# Patient Record
Sex: Male | Born: 1945
Health system: Southern US, Community
[De-identification: ages and names within clinical notes are randomized; demographics above are authoritative.]

## PROBLEM LIST (undated history)

## (undated) DIAGNOSIS — R0600 Dyspnea, unspecified: Secondary | ICD-10-CM

## (undated) DIAGNOSIS — T8859XA Other complications of anesthesia, initial encounter: Secondary | ICD-10-CM

## (undated) DIAGNOSIS — Z9981 Dependence on supplemental oxygen: Secondary | ICD-10-CM

## (undated) DIAGNOSIS — I499 Cardiac arrhythmia, unspecified: Secondary | ICD-10-CM

## (undated) DIAGNOSIS — M545 Low back pain, unspecified: Secondary | ICD-10-CM

## (undated) DIAGNOSIS — E079 Disorder of thyroid, unspecified: Secondary | ICD-10-CM

## (undated) DIAGNOSIS — M1990 Unspecified osteoarthritis, unspecified site: Secondary | ICD-10-CM

## (undated) DIAGNOSIS — I10 Essential (primary) hypertension: Secondary | ICD-10-CM

## (undated) DIAGNOSIS — Z9889 Other specified postprocedural states: Secondary | ICD-10-CM

## (undated) DIAGNOSIS — J40 Bronchitis, not specified as acute or chronic: Secondary | ICD-10-CM

## (undated) DIAGNOSIS — Z8719 Personal history of other diseases of the digestive system: Secondary | ICD-10-CM

## (undated) DIAGNOSIS — I639 Cerebral infarction, unspecified: Secondary | ICD-10-CM

## (undated) DIAGNOSIS — K219 Gastro-esophageal reflux disease without esophagitis: Secondary | ICD-10-CM

## (undated) DIAGNOSIS — J449 Chronic obstructive pulmonary disease, unspecified: Secondary | ICD-10-CM

## (undated) DIAGNOSIS — J8489 Other specified interstitial pulmonary diseases: Secondary | ICD-10-CM

## (undated) DIAGNOSIS — Z87442 Personal history of urinary calculi: Secondary | ICD-10-CM

## (undated) DIAGNOSIS — E039 Hypothyroidism, unspecified: Secondary | ICD-10-CM

## (undated) DIAGNOSIS — J189 Pneumonia, unspecified organism: Secondary | ICD-10-CM

## (undated) DIAGNOSIS — N200 Calculus of kidney: Secondary | ICD-10-CM

## (undated) DIAGNOSIS — F17200 Nicotine dependence, unspecified, uncomplicated: Secondary | ICD-10-CM

## (undated) DIAGNOSIS — D689 Coagulation defect, unspecified: Secondary | ICD-10-CM

## (undated) DIAGNOSIS — K635 Polyp of colon: Secondary | ICD-10-CM

## (undated) HISTORY — PX: FACIAL FRACTURE SURGERY: SHX1570

## (undated) HISTORY — DX: Unspecified osteoarthritis, unspecified site: M19.90

## (undated) HISTORY — PX: ESOPHAGOGASTRODUODENOSCOPY ENDOSCOPY: SHX5814

## (undated) HISTORY — PX: ULNAR NERVE TRANSPOSITION: SHX2595

## (undated) HISTORY — DX: Essential (primary) hypertension: I10

## (undated) HISTORY — DX: Coagulation defect, unspecified: D68.9

## (undated) HISTORY — DX: Other specified interstitial pulmonary diseases: J84.89

## (undated) HISTORY — DX: Gastro-esophageal reflux disease without esophagitis: K21.9

## (undated) HISTORY — DX: Nicotine dependence, unspecified, uncomplicated: F17.200

## (undated) HISTORY — PX: DUPUYTREN CONTRACTURE RELEASE: SHX1478

## (undated) HISTORY — DX: Low back pain: M54.5

## (undated) HISTORY — DX: Low back pain, unspecified: M54.50

## (undated) HISTORY — DX: Calculus of kidney: N20.0

## (undated) HISTORY — DX: Chronic obstructive pulmonary disease, unspecified: J44.9

## (undated) HISTORY — PX: COLONOSCOPY: SHX174

## (undated) HISTORY — DX: Disorder of thyroid, unspecified: E07.9

## (undated) HISTORY — PX: CATARACT EXTRACTION: SUR2

## (undated) HISTORY — DX: Bronchitis, not specified as acute or chronic: J40

## (undated) HISTORY — DX: Polyp of colon: K63.5

## (undated) HISTORY — PX: OTHER SURGICAL HISTORY: SHX169

---

## 1998-09-23 ENCOUNTER — Encounter: Admission: RE | Admit: 1998-09-23 | Discharge: 1998-11-02 | Payer: Self-pay | Admitting: Neurosurgery

## 1998-09-30 ENCOUNTER — Ambulatory Visit (HOSPITAL_BASED_OUTPATIENT_CLINIC_OR_DEPARTMENT_OTHER): Admission: RE | Admit: 1998-09-30 | Discharge: 1998-09-30 | Payer: Self-pay | Admitting: Otolaryngology

## 1998-11-21 ENCOUNTER — Ambulatory Visit (HOSPITAL_COMMUNITY): Admission: RE | Admit: 1998-11-21 | Discharge: 1998-11-21 | Payer: Self-pay | Admitting: Neurosurgery

## 1998-11-21 ENCOUNTER — Encounter: Payer: Self-pay | Admitting: Neurosurgery

## 1998-12-05 ENCOUNTER — Ambulatory Visit (HOSPITAL_COMMUNITY): Admission: RE | Admit: 1998-12-05 | Discharge: 1998-12-05 | Payer: Self-pay | Admitting: Neurosurgery

## 1998-12-05 ENCOUNTER — Encounter: Payer: Self-pay | Admitting: Neurosurgery

## 1998-12-19 ENCOUNTER — Encounter: Payer: Self-pay | Admitting: Neurosurgery

## 1998-12-19 ENCOUNTER — Ambulatory Visit (HOSPITAL_COMMUNITY): Admission: RE | Admit: 1998-12-19 | Discharge: 1998-12-19 | Payer: Self-pay | Admitting: Neurosurgery

## 1999-03-22 ENCOUNTER — Ambulatory Visit (HOSPITAL_COMMUNITY): Admission: RE | Admit: 1999-03-22 | Discharge: 1999-03-22 | Payer: Self-pay | Admitting: Neurosurgery

## 1999-03-22 ENCOUNTER — Encounter: Payer: Self-pay | Admitting: Neurosurgery

## 1999-04-05 ENCOUNTER — Encounter: Payer: Self-pay | Admitting: Neurosurgery

## 1999-04-05 ENCOUNTER — Ambulatory Visit (HOSPITAL_COMMUNITY): Admission: RE | Admit: 1999-04-05 | Discharge: 1999-04-05 | Payer: Self-pay | Admitting: Neurosurgery

## 2000-07-03 ENCOUNTER — Ambulatory Visit (HOSPITAL_COMMUNITY): Admission: RE | Admit: 2000-07-03 | Discharge: 2000-07-03 | Payer: Self-pay | Admitting: *Deleted

## 2000-07-03 HISTORY — PX: CARDIAC CATHETERIZATION: SHX172

## 2001-04-07 ENCOUNTER — Ambulatory Visit: Admission: RE | Admit: 2001-04-07 | Discharge: 2001-04-07 | Payer: Self-pay | Admitting: Internal Medicine

## 2001-04-07 ENCOUNTER — Encounter: Payer: Self-pay | Admitting: Internal Medicine

## 2001-04-09 ENCOUNTER — Ambulatory Visit (HOSPITAL_COMMUNITY): Admission: RE | Admit: 2001-04-09 | Discharge: 2001-04-09 | Payer: Self-pay | Admitting: Internal Medicine

## 2001-04-09 ENCOUNTER — Encounter (INDEPENDENT_AMBULATORY_CARE_PROVIDER_SITE_OTHER): Payer: Self-pay | Admitting: *Deleted

## 2004-03-17 ENCOUNTER — Ambulatory Visit: Payer: Self-pay | Admitting: Internal Medicine

## 2004-04-20 ENCOUNTER — Ambulatory Visit: Payer: Self-pay | Admitting: Internal Medicine

## 2005-03-19 ENCOUNTER — Ambulatory Visit (HOSPITAL_COMMUNITY): Admission: RE | Admit: 2005-03-19 | Discharge: 2005-03-19 | Payer: Self-pay | Admitting: Family Medicine

## 2005-04-05 ENCOUNTER — Encounter: Admission: RE | Admit: 2005-04-05 | Discharge: 2005-04-05 | Payer: Self-pay | Admitting: Orthopedic Surgery

## 2005-10-18 ENCOUNTER — Ambulatory Visit: Payer: Self-pay | Admitting: Internal Medicine

## 2005-10-22 ENCOUNTER — Ambulatory Visit: Payer: Self-pay | Admitting: Cardiology

## 2009-02-17 ENCOUNTER — Ambulatory Visit (HOSPITAL_COMMUNITY): Admission: RE | Admit: 2009-02-17 | Discharge: 2009-02-17 | Payer: Self-pay | Admitting: Orthopedic Surgery

## 2009-04-21 ENCOUNTER — Encounter (INDEPENDENT_AMBULATORY_CARE_PROVIDER_SITE_OTHER): Payer: Self-pay | Admitting: *Deleted

## 2009-09-05 ENCOUNTER — Telehealth: Payer: Self-pay | Admitting: Internal Medicine

## 2010-05-30 NOTE — Letter (Signed)
Summary: Colonoscopy Letter  [facility]  [address] [place], [place] [address]   Phone: [phone]  Fax: [phone]      April 21, 2009 MRN: [id number]   ERICA OSUNA [address] [facility] [address] [place], [place]  [address]   Dear Mr. STAIRS,   According to your medical record, it is time for you to schedule a Colonoscopy. The American Cancer Society recommends this procedure as a method to detect early colon cancer. Patients with a family history of colon cancer, or a personal history of colon polyps or inflammatory bowel disease are at increased risk.  This letter has beeen generated based on the recommendations made at the time of your procedure. If you feel that in your particular situation this may no longer apply, please contact our office.  Please call our office at [phone] to schedule this appointment or to update your records at your earliest convenience.  Thank you for cooperating with [place] to provide you with the very best care possible.   Sincerely,  Wilhemina Bonito. Marina Goodell, M.D.  [facility] [phone]

## 2010-05-30 NOTE — Progress Notes (Signed)
Summary: Schedule Colonoscopy  Phone Note Outgoing Call Call back at [facility] Phone [phone]   Call placed by: Harlow Mares CMA Duncan Dull),  Sep 05, 2009 10:43 AM Call placed to: Patient Summary of Call: Left message on patients machine to call back. patient is due for his colonoscopy Initial call taken by: Harlow Mares CMA Duncan Dull),  Sep 05, 2009 10:44 AM  Follow-up for Phone Call        Left message on patients machine to call back. we will mail the pt a letter Follow-up by: Harlow Mares CMA Duncan Dull),  Sep 14, 2009 10:57 AM

## 2010-05-31 ENCOUNTER — Encounter: Payer: Self-pay | Admitting: Family Medicine

## 2010-08-28 ENCOUNTER — Ambulatory Visit (HOSPITAL_COMMUNITY)
Admission: RE | Admit: 2010-08-28 | Discharge: 2010-08-28 | Disposition: A | Discharge status: Home / Self Care | Payer: BC Managed Care – PPO | Source: Ambulatory Visit | Attending: Family Medicine | Admitting: Family Medicine

## 2010-08-28 ENCOUNTER — Other Ambulatory Visit: Payer: Self-pay | Admitting: Family Medicine

## 2010-08-28 DIAGNOSIS — J984 Other disorders of lung: Secondary | ICD-10-CM | POA: Insufficient documentation

## 2010-08-28 DIAGNOSIS — R918 Other nonspecific abnormal finding of lung field: Secondary | ICD-10-CM

## 2010-08-28 DIAGNOSIS — J438 Other emphysema: Secondary | ICD-10-CM | POA: Insufficient documentation

## 2010-08-28 MED ORDER — IOHEXOL 300 MG/ML  SOLN
80.0000 mL | Freq: Once | INTRAMUSCULAR | Status: AC | PRN
  Administered 2010-08-28: 80 mL via INTRAVENOUS

## 2010-09-15 NOTE — Cardiovascular Report (Signed)
[facility]  Patient:    Antonio Bowen, Antonio Bowen                     MRN: [id number] Proc. Date: 07/03/00 Adm. Date:  [id number] Attending:  Daisey Must CC:         Monica Becton, M.D.             Rollene Rotunda, M.D. [facility]             Cardiac Catheterization Lab                        Cardiac Catheterization  PROCEDURE:   Left heart catheterization with coronary angiography and left ventriculography.  CARDIOLOGIST:  Daisey Must, M.D. [facility]  INDICATION:  Mr. Kohlmann is a 65 year old male who has been having chest pain. A stress Cardiolite was interpreted as revealing mild anterior ischemia and possible mild inferior ischemia.  He was, therefore, referred for cardiac catheterization.  CATHETERIZATION PROCEDURAL NOTE:  A 6-French sheath was placed in the right femoral artery.  Standard Judkins 6-French catheters were utilized.  Contrast is Omnipaque.  There were no complications.  CATHETERIZATION RESULTS:  HEMODYNAMICS:  Left ventricular pressure 144/12, aortic pressure 144/84. There was no aortic valve gradient.  LEFT VENTRICULOGRAM:  Wall motion is normal.  Ejection fraction calculated at 61%.  There was trace mitral regurgitation.  CORONARY ANGIOGRAPHY: (Right dominant).  Left main is normal.  Left anterior descending artery gives risk to a small first diagonal and normal size second diagonal.  The LAD is free of angiographic disease.  Left circumflex coronary artery gives rise to a normal size OM-1, small OM-2, and normal size OM-3.  The left circumflex is free of angiographic disease. Right coronary artery is a dominant vessel giving rise to a normal size posterior descending artery and two small posterolateral branches.   The right coronary artery is free of angiographic disease.  IMPRESSIONS: 1. Normal left ventricular systolic function. 2. Angiographically normal coronary arteries. DD:  07/03/00 TD:  07/04/00 Job:  [id number]

## 2010-09-20 ENCOUNTER — Encounter: Payer: Self-pay | Admitting: Critical Care Medicine

## 2010-09-21 ENCOUNTER — Encounter: Payer: Self-pay | Admitting: Critical Care Medicine

## 2010-09-21 ENCOUNTER — Ambulatory Visit (INDEPENDENT_AMBULATORY_CARE_PROVIDER_SITE_OTHER): Payer: BC Managed Care – PPO | Admitting: Critical Care Medicine

## 2010-09-21 DIAGNOSIS — E039 Hypothyroidism, unspecified: Secondary | ICD-10-CM | POA: Insufficient documentation

## 2010-09-21 DIAGNOSIS — R918 Other nonspecific abnormal finding of lung field: Secondary | ICD-10-CM

## 2010-09-21 DIAGNOSIS — J441 Chronic obstructive pulmonary disease with (acute) exacerbation: Secondary | ICD-10-CM | POA: Insufficient documentation

## 2010-09-21 DIAGNOSIS — I10 Essential (primary) hypertension: Secondary | ICD-10-CM

## 2010-09-21 DIAGNOSIS — J449 Chronic obstructive pulmonary disease, unspecified: Secondary | ICD-10-CM

## 2010-09-21 DIAGNOSIS — K219 Gastro-esophageal reflux disease without esophagitis: Secondary | ICD-10-CM | POA: Insufficient documentation

## 2010-09-21 DIAGNOSIS — M545 Low back pain, unspecified: Secondary | ICD-10-CM | POA: Insufficient documentation

## 2010-09-21 DIAGNOSIS — H900 Conductive hearing loss, bilateral: Secondary | ICD-10-CM

## 2010-09-21 NOTE — Assessment & Plan Note (Signed)
Mild COPD FeV1 80% pred Fef 25 75 50% , ex long time smoker  Plan Cont symbicort for now The pt has successfully quit smoking 4/12

## 2010-09-21 NOTE — Patient Instructions (Addendum)
No change in medications A bronchoscopy will be performed at [facility] on 09/27/10  At 1130AM,  Arrive at 1030AM, nothing by mouth after midnight the night before Return [facility] office for followup June 1st, Friday

## 2010-09-21 NOTE — Progress Notes (Signed)
Subjective:     Patient ID: Antonio Bowen, male   DOB: 12-Aug-1945, 65 y.o.   MRN: [id number]  Shortness of Breath This is a recurrent problem. The current episode started more than 1 month ago. The problem occurs daily. The problem has been unchanged (Dyspneic at rest and exertion). Associated symptoms include headaches, orthopnea, PND, rhinorrhea, sputum production and wheezing. Pertinent negatives include no abdominal pain, chest pain, ear pain, fever, hemoptysis, leg swelling, neck pain, rash, sore throat or vomiting. The symptoms are aggravated by eating, exercise, any activity, fumes, odors and weather changes. Associated symptoms comments: Had night sweats Mucus was green brown. He has tried beta agonist inhalers and steroid inhalers (antibiotics have helped ) for the symptoms. The treatment provided moderate relief. His past medical history is significant for bronchiolitis, chronic lung disease, COPD and pneumonia. There is no history of asthma.  Cough This is a chronic problem. The current episode started more than 1 month ago. The problem has been gradually improving. The cough is productive of purulent sputum. Associated symptoms include chills, headaches, postnasal drip, rhinorrhea, shortness of breath, sweats, weight loss and wheezing. Pertinent negatives include no chest pain, ear pain, fever, hemoptysis, myalgias, rash or sore throat. Associated symptoms comments: Lost 8# but gaining it back. The symptoms are aggravated by cold air, exercise and fumes. He has tried steroid inhaler and a beta-agonist inhaler (antibiotics) for the symptoms. The treatment provided moderate relief. His past medical history is significant for bronchitis, COPD and pneumonia. There is no history of asthma.   [name] referred for abn CT/CXR 04/10/10: started symptoms.  Had physical exam. Had dyspnea.  Felt as though pattern to breath abn.  Then started with a panic. Xray abn R side.  CT scan not done though  ordered. Then end of 4/12:  Got worse with more congestion, head stopped up, sweats.  XRay worse, CT scan and progressed with bilat UPPER lobe infilt.  Treatment then avelox x 7days.  Plus Mucinex D.  Then rx two more doses one week ago.  This did help the symptoms but not more than 80%.  Also started symbicort two puff once a day then increased to twice daily and this helped sleep better.  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 Bronchitis   . Tobacco dependence   . COPD (chronic obstructive pulmonary disease)   . Hypertension   . GERD (gastroesophageal reflux disease)   . Allergic rhinitis   . Low back pain      Family History  Problem Relation Age of Onset  . Arthritis Father   . Cancer Maternal Grandfather   . Prostate cancer Maternal Grandfather   . Cancer Maternal Grandmother     stomach  . Cancer Paternal Grandmother      History   Social History  . Marital Status: Married    Spouse Name: N/A    Number of Children: 2  . Years of Education: N/A   Occupational History  . 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 Smoking status: Former Smoker -- 1.0 packs/day for 50 years    Types: Cigarettes    Quit date: 07/30/2010  . Smokeless tobacco: Current User    Types: Snuff, Chew  .  Alcohol Use: No  . Drug Use: No  . Sexually Active: Not on file   Other Topics Concern  . Not on file   Social History Narrative  . No narrative on file     Allergies  Allergen Reactions  . Nuts   . Vicodin (Hydrocodone-Acetaminophen)     tachycardia  . Aspirin     Upset stomach, bleeding  . Penicillins     Rash, hives     Outpatient Prescriptions Prior to Visit  Medication Sig Dispense Refill  . fluticasone (FLONASE) 50 MCG/ACT nasal spray 2 sprays by  Nasal route daily as needed.       Marland Kitchen levothyroxine (SYNTHROID, LEVOTHROID) 125 MCG tablet Take 125 mcg by mouth daily.        Marland Kitchen losartan-hydrochlorothiazide (HYZAAR) 50-12.5 MG per tablet Take 1 tablet by mouth daily.        . meloxicam (MOBIC) 15 MG tablet Take 15 mg by mouth daily.        Marland Kitchen omeprazole (PRILOSEC) 20 MG capsule Take 20 mg by mouth daily.        . traMADol (ULTRAM) 50 MG tablet One by mouth every 6-8 hours as needed       . tiotropium (SPIRIVA) 18 MCG inhalation capsule Place 18 mcg into inhaler and inhale daily.           Review of Systems  Constitutional: Positive for chills, weight loss, diaphoresis, activity change, appetite change, fatigue and unexpected weight change. Negative for fever.  HENT: Positive for congestion, facial swelling, rhinorrhea, voice change and postnasal drip. Negative for hearing loss, ear pain, nosebleeds, sore throat, sneezing, mouth sores, trouble swallowing, neck pain, neck stiffness, dental problem, sinus pressure, tinnitus and ear discharge.   Eyes: Positive for visual disturbance. Negative for photophobia, discharge and itching.  Respiratory: Positive for cough, sputum production, chest tightness, shortness of breath and wheezing. Negative for apnea, hemoptysis, choking and stridor.   Cardiovascular: Positive for orthopnea and PND. Negative for chest pain, palpitations and leg swelling.  Gastrointestinal: Positive for nausea. Negative for vomiting, abdominal pain, constipation, blood in stool and abdominal distention.  Genitourinary: Negative for dysuria, urgency, frequency, hematuria, flank pain, decreased urine volume and difficulty urinating.  Musculoskeletal: Positive for back pain and gait problem. Negative for myalgias, joint swelling and arthralgias.  Skin: Positive for color change and pallor. Negative for rash.  Neurological: Positive for dizziness, tremors, weakness, light-headedness, numbness and headaches. Negative for seizures,  syncope and speech difficulty.  Hematological: Negative for adenopathy. Does not bruise/bleed easily.  Psychiatric/Behavioral: Positive for sleep disturbance and agitation. Negative for confusion. The patient is nervous/anxious.        Objective:   Physical Exam Filed Vitals:   09/21/10 1538  BP: 120/70  Pulse: 80  Temp: 98.4 F (36.9 C)  TempSrc: Oral  Height: 5\' 9"  (1.753 m)  Weight: 201 lb (91.173 kg)  SpO2: 97%    Gen: Pleasant, well-nourished, in no distress,  normal affect, hard of hearing  ENT: No lesions,  mouth clear,  oropharynx clear, no postnasal drip  Neck: No JVD, no TMG, no carotid bruits  Lungs: No use of accessory muscles, no dullness to percussion, distant BS  Cardiovascular: RRR, heart sounds normal, no murmur or gallops, no peripheral edema  Abdomen: soft and NT, no HSM,  BS normal  Musculoskeletal: No deformities, no cyanosis or clubbing  Neuro: alert, non focal  Skin: Warm, no lesions or rashes   CT CHEST WITH CONTRAST  08/28/10:  Technique: Multidetector CT imaging of the chest was performed  following the standard protocol during bolus administration of  intravenous contrast.  Contrast: 80 ml of omni 300  Comparison: 10/22/2005  Findings:  Right supraclavicular lymph node measures 0.9 cm, image 6.  No mediastinal or hilar adenopathy.  No pericardial or pleural effusion identified.  The trachea appears patent and is midline.  Advanced emphysema.  There is a bilateral upper lobe airspace consolidation, left  greater than right  Subpleural nodule within the right lower lobe is unchanged  measuring 3-4 mm, image 29. Right middle lobe pulmonary nodule  measures 4.9 mm, image 36. This is unchanged from previous exam.  Similar appearance of the left upper lobe pulmonary nodule  measuring 4.1 mm, image 23.  Review of the visualized osseous structures is significant for mild  spondylosis within the thoracic spine.  Limited imaging through the  upper abdomen shows a cyst within the  upper pole the right kidney. This is only partially visualized.  IMPRESSION:  1. Bilateral upper lobe airspace consolidation, left greater than  right. Likely secondary to pneumonia. This will need interval  radiographic followup to ensure resolution and to rule out  underlying malignancy.  2. Emphysema.  3. Small bilateral pulmonary nodules. Similar in appearance to  03/19/2005.      Assessment:     Bilateral pulmonary infiltrates on chest x-ray Bilateral upper lobe infiltrates ? Etiology.  ?bronchoalveolar CA ?dust exposure ? Chronic infection ?BOOP post pneumonia Plan Fob , once results available, further recommendations will follow No further abx   COPD (chronic obstructive pulmonary disease) Mild COPD FeV1 80% pred Fef 25 75 50% , ex long time smoker  Plan Cont symbicort for now The pt has successfully quit smoking 4/12     Updated Medication List Outpatient Encounter Prescriptions as of 09/21/2010  Medication Sig Dispense Refill  . budesonide-formoterol (SYMBICORT) 160-4.5 MCG/ACT inhaler Inhale 2 puffs into the lungs 2 (two) times daily.        . fluticasone (FLONASE) 50 MCG/ACT nasal spray 2 sprays by Nasal route daily as needed.       Marland Kitchen levothyroxine (SYNTHROID, LEVOTHROID) 125 MCG tablet Take 125 mcg by mouth daily.        Marland Kitchen losartan-hydrochlorothiazide (HYZAAR) 50-12.5 MG per tablet Take 1 tablet by mouth daily.        . meloxicam (MOBIC) 15 MG tablet Take 15 mg by mouth daily.        Marland Kitchen omeprazole (PRILOSEC) 20 MG capsule Take 20 mg by mouth daily.        . traMADol (ULTRAM) 50 MG tablet One by mouth every 6-8 hours as needed       . DISCONTD: tiotropium (SPIRIVA) 18 MCG inhalation capsule Place 18 mcg into inhaler and inhale daily.               Plan:

## 2010-09-21 NOTE — Assessment & Plan Note (Signed)
Bilateral upper lobe infiltrates ? Etiology.  ?bronchoalveolar CA ?dust exposure ? Chronic infection ?BOOP post pneumonia Plan Fob , once results available, further recommendations will follow No further abx

## 2010-09-27 ENCOUNTER — Ambulatory Visit (HOSPITAL_COMMUNITY)
Admission: RE | Admit: 2010-09-27 | Discharge: 2010-09-27 | Disposition: A | Discharge status: Home / Self Care | Payer: BC Managed Care – PPO | Source: Ambulatory Visit | Attending: Critical Care Medicine | Admitting: Critical Care Medicine

## 2010-09-27 ENCOUNTER — Other Ambulatory Visit: Payer: Self-pay | Admitting: Critical Care Medicine

## 2010-09-27 ENCOUNTER — Encounter: Payer: Self-pay | Admitting: Critical Care Medicine

## 2010-09-27 DIAGNOSIS — R918 Other nonspecific abnormal finding of lung field: Secondary | ICD-10-CM

## 2010-09-27 DIAGNOSIS — J984 Other disorders of lung: Secondary | ICD-10-CM

## 2010-09-27 DIAGNOSIS — J40 Bronchitis, not specified as acute or chronic: Secondary | ICD-10-CM | POA: Insufficient documentation

## 2010-09-29 ENCOUNTER — Other Ambulatory Visit: Payer: Self-pay | Admitting: Critical Care Medicine

## 2010-09-29 ENCOUNTER — Other Ambulatory Visit: Payer: BC Managed Care – PPO

## 2010-09-29 ENCOUNTER — Ambulatory Visit (INDEPENDENT_AMBULATORY_CARE_PROVIDER_SITE_OTHER): Payer: BC Managed Care – PPO | Admitting: Critical Care Medicine

## 2010-09-29 ENCOUNTER — Encounter: Payer: Self-pay | Admitting: Critical Care Medicine

## 2010-09-29 VITALS — BP 140/70 | HR 78 | Temp 98.1°F | Ht 69.0 in | Wt 199.8 lb

## 2010-09-29 DIAGNOSIS — J84116 Cryptogenic organizing pneumonia: Secondary | ICD-10-CM

## 2010-09-29 DIAGNOSIS — J8489 Other specified interstitial pulmonary diseases: Secondary | ICD-10-CM | POA: Insufficient documentation

## 2010-09-29 LAB — CULTURE, RESPIRATORY W GRAM STAIN: Gram Stain: NONE SEEN

## 2010-09-29 LAB — CULTURE, RESPIRATORY

## 2010-09-29 MED ORDER — BUDESONIDE-FORMOTEROL FUMARATE 160-4.5 MCG/ACT IN AERO: 2.0000 | INHALATION_SPRAY | Freq: Two times a day (BID) | RESPIRATORY_TRACT | Status: DC

## 2010-09-29 MED ORDER — PREDNISONE 10 MG PO TABS: ORAL_TABLET | ORAL | Status: DC

## 2010-09-29 NOTE — Progress Notes (Signed)
Subjective:     Patient ID: Antonio Bowen, male   DOB: Feb 25, 1946, 65 y.o.   MRN: [id number]  Shortness of Breath This is a recurrent problem. The current episode started more than 1 month ago. The problem occurs daily. The problem has been unchanged (Dyspneic at rest and exertion). Associated symptoms include headaches, orthopnea, PND, rhinorrhea, sputum production and wheezing. Pertinent negatives include no abdominal pain, chest pain, ear pain, fever, hemoptysis, leg swelling, neck pain, rash, sore throat or vomiting. The symptoms are aggravated by eating, exercise, any activity, fumes, odors and weather changes. Associated symptoms comments: Had night sweats Mucus was green brown. He has tried beta agonist inhalers and steroid inhalers (antibiotics have helped ) for the symptoms. The treatment provided moderate relief. His past medical history is significant for bronchiolitis, chronic lung disease, COPD and pneumonia. There is no history of asthma.  Cough This is a chronic problem. The current episode started more than 1 month ago. The problem has been gradually improving. The cough is productive of purulent sputum. Associated symptoms include chills, headaches, postnasal drip, rhinorrhea, shortness of breath, sweats, weight loss and wheezing. Pertinent negatives include no chest pain, ear pain, fever, hemoptysis, myalgias, rash or sore throat. Associated symptoms comments: Lost 8# but gaining it back. The symptoms are aggravated by cold air, exercise and fumes. He has tried steroid inhaler and a beta-agonist inhaler (antibiotics) for the symptoms. The treatment provided moderate relief. His past medical history is significant for bronchitis, COPD and pneumonia. There is no history of asthma.   [name] referred for abn CT/CXR 04/10/10: started symptoms.  Had physical exam. Had dyspnea.  Felt as though pattern to breath abn.  Then started with a panic. Xray abn R side.  CT scan not done though  ordered. Then end of 4/12:  Got worse with more congestion, head stopped up, sweats.  XRay worse, CT scan and progressed with bilat UPPER lobe infilt.  Treatment then avelox x 7days.  Plus Mucinex D.  Then rx two more doses one week ago.  This did help the symptoms but not more than 80%.  Also started symbicort two puff once a day then increased to twice daily and this helped sleep better.  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  Bx pos Boop.  No silica or dust material.  No longer smoking. No change in symptoms, still on symbicort.    Off all abx.  Cough and dyspnea symptoms still as above    Past Medical History  Diagnosis Date  . Bronchitis   . Tobacco dependence   . COPD (chronic obstructive pulmonary disease)   . Hypertension   . GERD (gastroesophageal reflux disease)   . Allergic rhinitis   . Low back pain      Family History  Problem Relation Age of Onset  . Arthritis Father   . Cancer Maternal Grandfather   . Prostate cancer Maternal Grandfather   . Cancer Maternal Grandmother     stomach  . Cancer Paternal Grandmother      History   Social History  . Marital Status: Married    Spouse Name: N/A    Number of Children: 2  . Years of Education: N/A   Occupational History  . 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 Smoking status: Former Smoker -- 1.0 packs/day for 50 years    Types: Cigarettes    Quit date: 07/30/2010  . Smokeless tobacco: Current User    Types: Snuff, Chew  . Alcohol Use: No  . Drug Use: No  . Sexually Active: Not on file   Other Topics Concern  . Not on file   Social History Narrative  . No narrative on file     Allergies  Allergen Reactions  . Nuts   . Vicodin (Hydrocodone-Acetaminophen)     tachycardia  . Aspirin      Upset stomach, bleeding  . Penicillins     Rash, hives     Outpatient Prescriptions Prior to Visit  Medication Sig Dispense Refill  . fluticasone (FLONASE) 50 MCG/ACT nasal spray 2 sprays by Nasal route daily as needed.       Marland Kitchen levothyroxine (SYNTHROID, LEVOTHROID) 125 MCG tablet Take 125 mcg by mouth daily.        Marland Kitchen losartan-hydrochlorothiazide (HYZAAR) 50-12.5 MG per tablet Take 1 tablet by mouth daily.        . meloxicam (MOBIC) 15 MG tablet Take 15 mg by mouth daily.        Marland Kitchen omeprazole (PRILOSEC) 20 MG capsule Take 20 mg by mouth daily.        . traMADol (ULTRAM) 50 MG tablet One by mouth every 6-8 hours as needed       . budesonide-formoterol (SYMBICORT) 160-4.5 MCG/ACT inhaler Inhale 2 puffs into the lungs 2 (two) times daily.           Review of Systems  Constitutional: Positive for chills, weight loss, diaphoresis, activity change, appetite change, fatigue and unexpected weight change. Negative for fever.  HENT: Positive for congestion, facial swelling, rhinorrhea, voice change and postnasal drip. Negative for hearing loss, ear pain, nosebleeds, sore throat, sneezing, mouth sores, trouble swallowing, neck pain, neck stiffness, dental problem, sinus pressure, tinnitus and ear discharge.   Eyes: Positive for visual disturbance. Negative for photophobia, discharge and itching.  Respiratory: Positive for cough, sputum production, chest tightness, shortness of breath and wheezing. Negative for apnea, hemoptysis, choking and stridor.   Cardiovascular: Positive for orthopnea and PND. Negative for chest pain, palpitations and leg swelling.  Gastrointestinal: Positive for nausea. Negative for vomiting, abdominal pain, constipation, blood in stool and abdominal distention.  Genitourinary: Negative for dysuria, urgency, frequency, hematuria, flank pain, decreased urine volume and difficulty urinating.  Musculoskeletal: Positive for back pain and gait problem. Negative for myalgias, joint  swelling and arthralgias.  Skin: Positive for color change and pallor. Negative for rash.  Neurological: Positive for dizziness, tremors, weakness, light-headedness, numbness and headaches. Negative for seizures, syncope and speech difficulty.  Hematological: Negative for adenopathy. Does not bruise/bleed easily.  Psychiatric/Behavioral: Positive for sleep disturbance and agitation. Negative for confusion. The patient is nervous/anxious.        Objective:   Physical Exam Filed Vitals:   09/29/10 1342  BP: 140/70  Pulse: 78  Temp: 98.1 F (36.7 C)  TempSrc: Oral  Height: 5\' 9"  (1.753 m)  Weight: 199 lb 12.8 oz (90.629 kg)  SpO2: 97%    Gen: Pleasant, well-nourished, in no distress,  normal affect, hard of hearing  ENT: No lesions,  mouth clear,  oropharynx clear, no postnasal drip  Neck: No JVD, no TMG, no carotid bruits  Lungs: No use of accessory muscles, no dullness to percussion, distant BS  Cardiovascular: RRR,  heart sounds normal, no murmur or gallops, no peripheral edema  Abdomen: soft and NT, no HSM,  BS normal  Musculoskeletal: No deformities, no cyanosis or clubbing  Neuro: alert, non focal  Skin: Warm, no lesions or rashes   CT CHEST WITH CONTRAST  08/28/10: Technique: Multidetector CT imaging of the chest was performed  following the standard protocol during bolus administration of  intravenous contrast.  Contrast: 80 ml of omni 300  Comparison: 10/22/2005  Findings:  Right supraclavicular lymph node measures 0.9 cm, image 6.  No mediastinal or hilar adenopathy.  No pericardial or pleural effusion identified.  The trachea appears patent and is midline.  Advanced emphysema.  There is a bilateral upper lobe airspace consolidation, left  greater than right  Subpleural nodule within the right lower lobe is unchanged  measuring 3-4 mm, image 29. Right middle lobe pulmonary nodule  measures 4.9 mm, image 36. This is unchanged from previous exam.  Similar  appearance of the left upper lobe pulmonary nodule  measuring 4.1 mm, image 23.  Review of the visualized osseous structures is significant for mild  spondylosis within the thoracic spine.  Limited imaging through the upper abdomen shows a cyst within the  upper pole the right kidney. This is only partially visualized.  IMPRESSION:  1. Bilateral upper lobe airspace consolidation, left greater than  right. Likely secondary to pneumonia. This will need interval  radiographic followup to ensure resolution and to rule out  underlying malignancy.  2. Emphysema.  3. Small bilateral pulmonary nodules. Similar in appearance to  03/19/2005.      Assessment:     BOOP (bronchiolitis obliterans with organizing pneumonia) Bilateral upper lobe infiltrates L>R Biopsies pos for BOOP, All c/s neg. Plan Start pulse prednisone 10mg  Take 4 for four days, 3 for four days, 2 for four days, then one daily No further ABX       Updated Medication List Outpatient Encounter Prescriptions as of 09/29/2010  Medication Sig Dispense Refill  . budesonide-formoterol (SYMBICORT) 160-4.5 MCG/ACT inhaler Inhale 2 puffs into the lungs 2 (two) times daily.  1 Inhaler  6  . fluticasone (FLONASE) 50 MCG/ACT nasal spray 2 sprays by Nasal route daily as needed.       Marland Kitchen levothyroxine (SYNTHROID, LEVOTHROID) 125 MCG tablet Take 125 mcg by mouth daily.        Marland Kitchen losartan-hydrochlorothiazide (HYZAAR) 50-12.5 MG per tablet Take 1 tablet by mouth daily.        . meloxicam (MOBIC) 15 MG tablet Take 15 mg by mouth daily.        Marland Kitchen omeprazole (PRILOSEC) 20 MG capsule Take 20 mg by mouth daily.        . traMADol (ULTRAM) 50 MG tablet One by mouth every 6-8 hours as needed       . DISCONTD: budesonide-formoterol (SYMBICORT) 160-4.5 MCG/ACT inhaler Inhale 2 puffs into the lungs 2 (two) times daily.        . predniSONE (DELTASONE) 10 MG tablet Take 4 for four days, 3 for four days, 2 for four days, then one daily  60 tablet  3          Plan:

## 2010-09-29 NOTE — Assessment & Plan Note (Addendum)
Bilateral upper lobe infiltrates L>R Biopsies pos for BOOP, All c/s neg. Plan Start pulse prednisone 10mg  Take 4 for four days, 3 for four days, 2 for four days, then one daily No further ABX

## 2010-09-29 NOTE — Patient Instructions (Signed)
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

## 2010-10-05 NOTE — Op Note (Signed)
  NAMEKYL, GIVLER              ACCOUNT NO.:  [id number]  MEDICAL RECORD NO.:  [id number]           PATIENT TYPE:  O  LOCATION:  RESP                         FACILITY:  [facility]  PHYSICIAN:  Charlcie Cradle. Delford Field, MD, FCCPDATE OF BIRTH:  1946/01/22  DATE OF PROCEDURE:  09/27/2010 DATE OF DISCHARGE:                              OPERATIVE REPORT   PROCEDURE:  Bronchoscopy.  CHIEF COMPLAINT:  Bilateral upper lobe infiltrates, evaluate for cause.  OPERATOR:  Charlcie Cradle. Delford Field, MD, FCCP  ANESTHESIA:  1% Xylocaine local.  PREOPERATIVE MEDICATION:  Versed 5 mg, fentanyl 55 mcg IV push.  PROCEDURE IN DETAIL:  The Pentax video bronchoscope was introduced via the left naris.  The upper airways were visualized and were unremarkable.  The entire tracheobronchial tree was visualized and revealed tracheobronchitis but no endobronchial lesions were seen. Attention was then paid to the left upper lobe, apical segment. Transbronchial biopsies x7 were obtained.  Bronchial washings were obtained.  Complications were none.  IMPRESSION:  Tracheobronchitis but no endobronchial lesions.  Bilateral upper lobe infiltrates, evaluate for malignancy versus bronchiolitis obliterans organized pneumonia versus granulomas  RECOMMENDATIONS:  Follow up pathology and microbiology.     Charlcie Cradle Delford Field, MD, [facility]     PEW/MEDQ  D:  09/27/2010  T:  09/27/2010  Job:  [id number]  cc:   Ernestina Penna, M.D. Fax: [phone]  Electronically Signed by Shan Levans MD FCCP on 10/05/2010 01:40:24 PM

## 2010-10-06 LAB — HYPERSENSITIVITY PNUEMONITIS PROFILE

## 2010-10-17 ENCOUNTER — Institutional Professional Consult (permissible substitution): Payer: BC Managed Care – PPO | Admitting: Critical Care Medicine

## 2010-10-25 LAB — FUNGUS CULTURE W SMEAR: Fungal Smear: NONE SEEN

## 2010-11-09 ENCOUNTER — Ambulatory Visit (HOSPITAL_BASED_OUTPATIENT_CLINIC_OR_DEPARTMENT_OTHER)
Admission: RE | Admit: 2010-11-09 | Discharge: 2010-11-09 | Disposition: A | Discharge status: Home / Self Care | Payer: Medicare HMO | Source: Ambulatory Visit | Attending: Critical Care Medicine | Admitting: Critical Care Medicine

## 2010-11-09 ENCOUNTER — Encounter: Payer: Self-pay | Admitting: Critical Care Medicine

## 2010-11-09 ENCOUNTER — Ambulatory Visit (INDEPENDENT_AMBULATORY_CARE_PROVIDER_SITE_OTHER): Payer: Medicare HMO | Admitting: Critical Care Medicine

## 2010-11-09 VITALS — BP 110/60 | HR 74 | Temp 98.2°F | Ht 69.0 in | Wt 207.0 lb

## 2010-11-09 DIAGNOSIS — R5381 Other malaise: Secondary | ICD-10-CM | POA: Insufficient documentation

## 2010-11-09 DIAGNOSIS — J8489 Other specified interstitial pulmonary diseases: Secondary | ICD-10-CM

## 2010-11-09 DIAGNOSIS — J90 Pleural effusion, not elsewhere classified: Secondary | ICD-10-CM

## 2010-11-09 DIAGNOSIS — J84116 Cryptogenic organizing pneumonia: Secondary | ICD-10-CM

## 2010-11-09 DIAGNOSIS — J984 Other disorders of lung: Secondary | ICD-10-CM | POA: Insufficient documentation

## 2010-11-09 LAB — AFB CULTURE WITH SMEAR (NOT AT ARMC): Acid Fast Smear: NONE SEEN

## 2010-11-09 MED ORDER — PREDNISONE 10 MG PO TABS: ORAL_TABLET | ORAL | Status: DC

## 2010-11-09 NOTE — Progress Notes (Signed)
Subjective:     Patient ID: Antonio Bowen, male   DOB: November 16, 1945, 65 y.o.   MRN: [id number]  Shortness of Breath This is a recurrent problem. The current episode started more than 1 month ago. The problem occurs daily. The problem has been unchanged (Dyspneic at rest and exertion). Associated symptoms include headaches, orthopnea, PND, rhinorrhea, sputum production and wheezing. Pertinent negatives include no abdominal pain, chest pain, ear pain, fever, hemoptysis, leg swelling, neck pain, rash, sore throat or vomiting. The symptoms are aggravated by eating, exercise, any activity, fumes, odors and weather changes. Associated symptoms comments: Had night sweats Mucus was green brown. He has tried beta agonist inhalers and steroid inhalers (antibiotics have helped ) for the symptoms. The treatment provided moderate relief. His past medical history is significant for bronchiolitis, chronic lung disease, COPD and pneumonia. There is no history of asthma.  Cough This is a chronic problem. The current episode started more than 1 month ago. The problem has been gradually improving. The cough is productive of purulent sputum. Associated symptoms include chills, headaches, postnasal drip, rhinorrhea, shortness of breath, sweats, weight loss and wheezing. Pertinent negatives include no chest pain, ear pain, fever, hemoptysis, myalgias, rash or sore throat. Associated symptoms comments: Lost 8# but gaining it back. The symptoms are aggravated by cold air, exercise and fumes. He has tried steroid inhaler and a beta-agonist inhaler (antibiotics) for the symptoms. The treatment provided moderate relief. His past medical history is significant for bronchitis, COPD and pneumonia. There is no history of asthma.   [name] referred for abn CT/CXR 04/10/10: started symptoms.  Had physical exam. Had dyspnea.  Felt as though pattern to breath abn.  Then started with a panic. Xray abn R side.  CT scan not done though  ordered. Then end of 4/12:  Got worse with more congestion, head stopped up, sweats.  XRay worse, CT scan and progressed with bilat UPPER lobe infilt.  Treatment then avelox x 7days.  Plus Mucinex D.  Then rx two more doses one week ago.  This did help the symptoms but not more than 80%.  Also started symbicort two puff once a day then increased to twice daily and this helped sleep better.  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   Did this 77yrs, quit 2011  09/29/10 Here for Fob f/u.  Bx pos Boop.  No silica or dust material.  No longer smoking. No change in symptoms, still on symbicort.    Off all abx.  Cough and dyspnea symptoms still as above  11/09/10 Still coughing.  Off abx.  All  Cultures are neg from fob On pred and symbicort .   Pt denies any significant sore throat, nasal congestion or excess secretions, fever, chills, sweats, unintended weight loss, pleurtic or exertional chest pain, orthopnea PND, or leg swelling Pt denies any increase in rescue therapy over baseline, denies waking up needing it or having any early am or nocturnal exacerbations of coughing/wheezing/or dyspnea. Pt also denies any obvious fluctuation in symptoms with  weather or environmental change or other alleviating or aggravating factors      Past Medical History  Diagnosis Date  . Bronchitis   . Tobacco dependence   . COPD (chronic obstructive pulmonary disease)   . Hypertension   . GERD (gastroesophageal reflux disease)   . Allergic rhinitis   . Low back pain  Family History  Problem Relation Age of Onset  . Arthritis Father   . Cancer Maternal Grandfather   . Prostate cancer Maternal Grandfather   . Cancer Maternal Grandmother     stomach  . Cancer Paternal Grandmother      History   Social History  . Marital Status: Married    Spouse Name: N/A    Number of Children: 2  . Years of Education:  N/A   Occupational History  . 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 Smoking status: Former Smoker -- 1.0 packs/day for 50 years    Types: Cigarettes    Quit date: 07/30/2010  . Smokeless tobacco: Current User    Types: Snuff, Chew  . Alcohol Use: No  . Drug Use: No  . Sexually Active: Not on file   Other Topics Concern  . Not on file   Social History Narrative  . No narrative on file     Allergies  Allergen Reactions  . Nuts   . Vicodin (Hydrocodone-Acetaminophen)     tachycardia  . Aspirin     Upset stomach, bleeding  . Penicillins     Rash, hives     Outpatient Prescriptions Prior to Visit  Medication Sig Dispense Refill  . budesonide-formoterol (SYMBICORT) 160-4.5 MCG/ACT inhaler Inhale 2 puffs into the lungs 2 (two) times daily.  1 Inhaler  6  . fluticasone (FLONASE) 50 MCG/ACT nasal spray 2 sprays by Nasal route daily as needed.       Marland Kitchen levothyroxine (SYNTHROID, LEVOTHROID) 125 MCG tablet Take 125 mcg by mouth daily.        Marland Kitchen losartan-hydrochlorothiazide (HYZAAR) 50-12.5 MG per tablet Take 1 tablet by mouth daily.        . meloxicam (MOBIC) 15 MG tablet Take 15 mg by mouth daily.        Marland Kitchen omeprazole (PRILOSEC) 20 MG capsule Take 20 mg by mouth daily.        . traMADol (ULTRAM) 50 MG tablet One by mouth every 6-8 hours as needed       . predniSONE (DELTASONE) 10 MG tablet Take 4 for four days, 3 for four days, 2 for four days, then one daily  60 tablet  3     Review of Systems  Constitutional: Positive for chills, weight loss, diaphoresis, activity change, appetite change, fatigue and unexpected weight change. Negative for fever.  HENT: Positive for congestion, facial swelling, rhinorrhea, voice change and postnasal drip. Negative for hearing loss, ear pain, nosebleeds, sore throat, sneezing, mouth sores, trouble swallowing, neck pain, neck stiffness, dental problem, sinus pressure, tinnitus and  ear discharge.   Eyes: Positive for visual disturbance. Negative for photophobia, discharge and itching.  Respiratory: Positive for cough, sputum production, chest tightness, shortness of breath and wheezing. Negative for apnea, hemoptysis, choking and stridor.   Cardiovascular: Positive for orthopnea and PND. Negative for chest pain, palpitations and leg swelling.  Gastrointestinal: Positive for nausea. Negative for vomiting, abdominal pain, constipation, blood in stool and abdominal distention.  Genitourinary: Negative for dysuria, urgency, frequency, hematuria, flank pain, decreased urine volume and difficulty urinating.  Musculoskeletal: Positive for back pain and gait problem. Negative for myalgias, joint swelling and arthralgias.  Skin: Positive for color change and pallor. Negative for rash.  Neurological: Positive for dizziness, tremors, weakness, light-headedness, numbness and headaches. Negative for seizures, syncope and speech difficulty.  Hematological: Negative for adenopathy.  Does not bruise/bleed easily.  Psychiatric/Behavioral: Positive for sleep disturbance and agitation. Negative for confusion. The patient is nervous/anxious.        Objective:   Physical Exam  Filed Vitals:   11/09/10 1036  BP: 110/60  Pulse: 74  Temp: 98.2 F (36.8 C)  TempSrc: Oral  Height: 5\' 9"  (1.753 m)  Weight: 207 lb (93.895 kg)  SpO2: 96%    Gen: Pleasant, well-nourished, in no distress,  normal affect, hard of hearing  ENT: No lesions,  mouth clear,  oropharynx clear, no postnasal drip  Neck: No JVD, no TMG, no carotid bruits  Lungs: No use of accessory muscles, no dullness to percussion, distant BS  Cardiovascular: RRR, heart sounds normal, no murmur or gallops, no peripheral edema  Abdomen: soft and NT, no HSM,  BS normal  Musculoskeletal: No deformities, no cyanosis or clubbing  Neuro: alert, non focal  Skin: Warm, no lesions or rashes   CT CHEST WITH CONTRAST   08/28/10: Technique: Multidetector CT imaging of the chest was performed  following the standard protocol during bolus administration of  intravenous contrast.  Contrast: 80 ml of omni 300  Comparison: 10/22/2005  Findings:  Right supraclavicular lymph node measures 0.9 cm, image 6.  No mediastinal or hilar adenopathy.  No pericardial or pleural effusion identified.  The trachea appears patent and is midline.  Advanced emphysema.  There is a bilateral upper lobe airspace consolidation, left  greater than right  Subpleural nodule within the right lower lobe is unchanged  measuring 3-4 mm, image 29. Right middle lobe pulmonary nodule  measures 4.9 mm, image 36. This is unchanged from previous exam.  Similar appearance of the left upper lobe pulmonary nodule  measuring 4.1 mm, image 23.  Review of the visualized osseous structures is significant for mild  spondylosis within the thoracic spine.  Limited imaging through the upper abdomen shows a cyst within the  upper pole the right kidney. This is only partially visualized.  IMPRESSION:  1. Bilateral upper lobe airspace consolidation, left greater than  right. Likely secondary to pneumonia. This will need interval  radiographic followup to ensure resolution and to rule out  underlying malignancy.  2. Emphysema.  3. Small bilateral pulmonary nodules. Similar in appearance to  03/19/2005.   7/12 CXR: IMPRESSION: Opacities in the upper lobes left greater than right are most consistent with scarring. No definite active process is seen.       Assessment:     BOOP (bronchiolitis obliterans with organizing pneumonia) Improvement in bilateral upper lobe infiltrates 11/09/10 Plan Prednisone: Reduce to 1 alternate with 1/2 tab every other day for 7 days then reduce to 1/2 tab daily for 7days then 1/2 every other day for 7days then stop Stay on Symbicort     Updated Medication List Outpatient Encounter Prescriptions as of 11/09/2010   Medication Sig Dispense Refill  . budesonide-formoterol (SYMBICORT) 160-4.5 MCG/ACT inhaler Inhale 2 puffs into the lungs 2 (two) times daily.  1 Inhaler  6  . fluticasone (FLONASE) 50 MCG/ACT nasal spray 2 sprays by Nasal route daily as needed.       Marland Kitchen levothyroxine (SYNTHROID, LEVOTHROID) 125 MCG tablet Take 125 mcg by mouth daily.        Marland Kitchen losartan-hydrochlorothiazide (HYZAAR) 50-12.5 MG per tablet Take 1 tablet by mouth daily.        . meloxicam (MOBIC) 15 MG tablet Take 15 mg by mouth daily.        Marland Kitchen omeprazole (PRILOSEC) 20  MG capsule Take 20 mg by mouth daily.        . predniSONE (DELTASONE) 10 MG tablet Reduce to 1 alternate with 1/2 tab every other day for 7 days then reduce to 1/2 tab daily for 7days then 1/2 every other day for 7days then stop  60 tablet  3  . traMADol (ULTRAM) 50 MG tablet One by mouth every 6-8 hours as needed       . DISCONTD: predniSONE (DELTASONE) 10 MG tablet Take 4 for four days, 3 for four days, 2 for four days, then one daily  60 tablet  3         Plan:

## 2010-11-09 NOTE — Patient Instructions (Signed)
Prednisone: Reduce to 1 alternate with 1/2 tab every other day for 7 days then reduce to 1/2 tab daily for 7days then 1/2 every other day for 7days then stop Stay on Symbicort, use samples A chest xray will be performed today, I will call with results Return 2 months Remember purselip breathing

## 2010-11-10 NOTE — Progress Notes (Signed)
Quick Note:  Called, spoke with pt's wife. She was informed xrays have improved per PW. She will inform pt of this and will have him call back if he has any additional questions. ______

## 2010-11-10 NOTE — Assessment & Plan Note (Addendum)
Improvement in bilateral upper lobe infiltrates 11/09/10 Plan Prednisone: Reduce to 1 alternate with 1/2 tab every other day for 7 days then reduce to 1/2 tab daily for 7days then 1/2 every other day for 7days then stop Stay on Symbicort

## 2011-01-04 ENCOUNTER — Encounter: Payer: Self-pay | Admitting: Critical Care Medicine

## 2011-01-04 ENCOUNTER — Ambulatory Visit (INDEPENDENT_AMBULATORY_CARE_PROVIDER_SITE_OTHER): Payer: Medicare HMO | Admitting: Critical Care Medicine

## 2011-01-04 VITALS — BP 126/82 | HR 88 | Temp 98.1°F | Ht 69.0 in | Wt 205.0 lb

## 2011-01-04 DIAGNOSIS — J84116 Cryptogenic organizing pneumonia: Secondary | ICD-10-CM

## 2011-01-04 DIAGNOSIS — J8489 Other specified interstitial pulmonary diseases: Secondary | ICD-10-CM

## 2011-01-04 DIAGNOSIS — J449 Chronic obstructive pulmonary disease, unspecified: Secondary | ICD-10-CM

## 2011-01-04 MED ORDER — FLUTTER DEVI: Status: DC

## 2011-01-04 NOTE — Assessment & Plan Note (Signed)
See copd assessment Boop resolved

## 2011-01-04 NOTE — Patient Instructions (Signed)
Stay on symbicort Use flutter valve three times daily Get a pneumonia and flu vaccine Return 4 months [place]

## 2011-01-04 NOTE — Assessment & Plan Note (Signed)
Resolved Boop, residual bilateral upper lobe scar, copd Mucus plugging Plan Pt reinstructed as to proper symbicort use Flutter valve Return 4 months

## 2011-01-04 NOTE — Progress Notes (Signed)
Subjective:     Patient ID: Antonio Bowen, male   DOB: 11-04-1945, 65 y.o.   MRN: [id number]  Shortness of Breath This is a recurrent problem. The current episode started more than 1 month ago. The problem occurs every several days. The problem has been gradually improving. Associated symptoms include sputum production. Pertinent negatives include no abdominal pain, chest pain, ear pain, fever, headaches, hemoptysis, leg swelling, neck pain, orthopnea, PND, rash, rhinorrhea, sore throat, vomiting or wheezing. The symptoms are aggravated by eating, exercise, any activity, fumes, odors and weather changes. Associated symptoms comments: Had night sweats Mucus was green brown. He has tried beta agonist inhalers and steroid inhalers (antibiotics have helped ) for the symptoms. The treatment provided moderate relief. His past medical history is significant for bronchiolitis, chronic lung disease, COPD and pneumonia. There is no history of asthma.  Cough This is a chronic problem. The current episode started more than 1 month ago. The problem has been gradually improving. The cough is productive of sputum. Associated symptoms include shortness of breath and weight loss. Pertinent negatives include no chest pain, chills, ear pain, fever, headaches, hemoptysis, myalgias, postnasal drip, rash, rhinorrhea, sore throat, sweats or wheezing. Associated symptoms comments: Lost 8# but gaining it back. The symptoms are aggravated by cold air, exercise and fumes. He has tried steroid inhaler and a beta-agonist inhaler (antibiotics) for the symptoms. The treatment provided moderate relief. His past medical history is significant for bronchitis, COPD and pneumonia. There is no history of asthma.   [name] referred for abn CT/CXR 04/10/10: started symptoms.  Had physical exam. Had dyspnea.  Felt as though pattern to breath abn.  Then started with a panic. Xray abn R side.  CT scan not done though ordered. Then end of 4/12:   Got worse with more congestion, head stopped up, sweats.  XRay worse, CT scan and progressed with bilat UPPER lobe infilt.  Treatment then avelox x 7days.  Plus Mucinex D.  Then rx two more doses one week ago.  This did help the symptoms but not more than 80%.  Also started symbicort two puff once a day then increased to twice daily and this helped sleep better.  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   Did this 7yrs, quit 2011  09/29/10 Here for Fob f/u.  Bx pos Boop.  No silica or dust material.  No longer smoking. No change in symptoms, still on symbicort.    Off all abx.  Cough and dyspnea symptoms still as above  11/09/10 Still coughing.  Off abx.  All  Cultures are neg from fob On pred and symbicort .   Pt denies any significant sore throat, nasal congestion or excess secretions, fever, chills, sweats, unintended weight loss, pleurtic or exertional chest pain, orthopnea PND, or leg swelling Pt denies any increase in rescue therapy over baseline, denies waking up needing it or having any early am or nocturnal exacerbations of coughing/wheezing/or dyspnea. Pt also denies any obvious fluctuation in symptoms with  weather or environmental change or other alleviating or aggravating factors  01/04/2011 Mucus is clear.  No real chest pain  Still tight in chest . Notes some dyspnea.      Past Medical History  Diagnosis Date  . Bronchitis   . Tobacco dependence   . COPD (chronic obstructive pulmonary disease)   . Hypertension   . GERD (gastroesophageal reflux disease)   .  Allergic rhinitis   . Low back pain      Family History  Problem Relation Age of Onset  . Arthritis Father   . Cancer Maternal Grandfather   . Prostate cancer Maternal Grandfather   . Cancer Maternal Grandmother     stomach  . Cancer Paternal Grandmother      History   Social History  . Marital Status: Married     Spouse Name: N/A    Number of Children: 2  . Years of Education: N/A   Occupational History  . 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 Smoking status: Former Smoker -- 1.0 packs/day for 50 years    Types: Cigarettes    Quit date: 07/30/2010  . Smokeless tobacco: Current User    Types: Snuff, Chew  . Alcohol Use: No  . Drug Use: No  . Sexually Active: Not on file   Other Topics Concern  . Not on file   Social History Narrative  . No narrative on file     Allergies  Allergen Reactions  . Nuts   . Vicodin (Hydrocodone-Acetaminophen)     tachycardia  . Aspirin     Upset stomach, bleeding  . Penicillins     Rash, hives     Outpatient Prescriptions Prior to Visit  Medication Sig Dispense Refill  . budesonide-formoterol (SYMBICORT) 160-4.5 MCG/ACT inhaler Inhale 2 puffs into the lungs 2 (two) times daily.  1 Inhaler  6  . fluticasone (FLONASE) 50 MCG/ACT nasal spray 2 sprays by Nasal route daily as needed.       Marland Kitchen levothyroxine (SYNTHROID, LEVOTHROID) 125 MCG tablet Take 125 mcg by mouth daily.        Marland Kitchen losartan-hydrochlorothiazide (HYZAAR) 50-12.5 MG per tablet Take 1 tablet by mouth daily.        . meloxicam (MOBIC) 15 MG tablet Take 15 mg by mouth daily.        Marland Kitchen omeprazole (PRILOSEC) 20 MG capsule Take 20 mg by mouth daily.        . traMADol (ULTRAM) 50 MG tablet One by mouth every 6-8 hours as needed       . predniSONE (DELTASONE) 10 MG tablet Reduce to 1 alternate with 1/2 tab every other day for 7 days then reduce to 1/2 tab daily for 7days then 1/2 every other day for 7days then stop  60 tablet  3     Review of Systems  Constitutional: Positive for weight loss and fatigue. Negative for fever, chills, diaphoresis, activity change, appetite change and unexpected weight change.  HENT: Positive for voice change. Negative for hearing loss, ear pain, nosebleeds, congestion, sore throat, facial swelling,  rhinorrhea, sneezing, mouth sores, trouble swallowing, neck pain, neck stiffness, dental problem, postnasal drip, sinus pressure, tinnitus and ear discharge.   Eyes: Negative for photophobia, discharge, itching and visual disturbance.  Respiratory: Positive for cough, sputum production, chest tightness and shortness of breath. Negative for apnea, hemoptysis, choking, wheezing and stridor.   Cardiovascular: Negative for chest pain, palpitations, orthopnea, leg swelling and PND.  Gastrointestinal: Negative for nausea, vomiting, abdominal pain, constipation, blood in stool and abdominal distention.  Genitourinary: Negative for dysuria, urgency, frequency, hematuria, flank pain, decreased urine volume and difficulty urinating.  Musculoskeletal: Positive for back pain and gait problem. Negative for myalgias, joint swelling and arthralgias.  Skin: Negative for color change, pallor and rash.  Neurological: Positive for numbness.  Negative for dizziness, tremors, seizures, syncope, speech difficulty, weakness, light-headedness and headaches.  Hematological: Negative for adenopathy. Does not bruise/bleed easily.  Psychiatric/Behavioral: Positive for sleep disturbance. Negative for confusion and agitation. The patient is nervous/anxious.        Objective:   Physical Exam  Filed Vitals:   01/04/11 1006  BP: 126/82  Pulse: 88  Temp: 98.1 F (36.7 C)  TempSrc: Oral  Height: 5\' 9"  (1.753 m)  Weight: 205 lb (92.987 kg)  SpO2: 97%    Gen: Pleasant, well-nourished, in no distress,  normal affect, hard of hearing  ENT: No lesions,  mouth clear,  oropharynx clear, no postnasal drip  Neck: No JVD, no TMG, no carotid bruits  Lungs: No use of accessory muscles, no dullness to percussion, distant BS  Cardiovascular: RRR, heart sounds normal, no murmur or gallops, no peripheral edema  Abdomen: soft and NT, no HSM,  BS normal  Musculoskeletal: No deformities, no cyanosis or clubbing  Neuro: alert,  non focal  Skin: Warm, no lesions or rashes   CT CHEST WITH CONTRAST  08/28/10: Technique: Multidetector CT imaging of the chest was performed  following the standard protocol during bolus administration of  intravenous contrast.  Contrast: 80 ml of omni 300  Comparison: 10/22/2005  Findings:  Right supraclavicular lymph node measures 0.9 cm, image 6.  No mediastinal or hilar adenopathy.  No pericardial or pleural effusion identified.  The trachea appears patent and is midline.  Advanced emphysema.  There is a bilateral upper lobe airspace consolidation, left  greater than right  Subpleural nodule within the right lower lobe is unchanged  measuring 3-4 mm, image 29. Right middle lobe pulmonary nodule  measures 4.9 mm, image 36. This is unchanged from previous exam.  Similar appearance of the left upper lobe pulmonary nodule  measuring 4.1 mm, image 23.  Review of the visualized osseous structures is significant for mild  spondylosis within the thoracic spine.  Limited imaging through the upper abdomen shows a cyst within the  upper pole the right kidney. This is only partially visualized.  IMPRESSION:  1. Bilateral upper lobe airspace consolidation, left greater than  right. Likely secondary to pneumonia. This will need interval  radiographic followup to ensure resolution and to rule out  underlying malignancy.  2. Emphysema.  3. Small bilateral pulmonary nodules. Similar in appearance to  03/19/2005.   7/12 CXR: IMPRESSION: Opacities in the upper lobes left greater than right are most consistent with scarring. No definite active process is seen.       Assessment:     COPD (chronic obstructive pulmonary disease) Resolved Boop, residual bilateral upper lobe scar, copd Mucus plugging Plan Pt reinstructed as to proper symbicort use Flutter valve Return 4 months   BOOP (bronchiolitis obliterans with organizing pneumonia) See copd assessment Boop  resolved      Updated Medication List Outpatient Encounter Prescriptions as of 01/04/2011  Medication Sig Dispense Refill  . budesonide-formoterol (SYMBICORT) 160-4.5 MCG/ACT inhaler Inhale 2 puffs into the lungs 2 (two) times daily.  1 Inhaler  6  . fluticasone (FLONASE) 50 MCG/ACT nasal spray 2 sprays by Nasal route daily as needed.       Marland Kitchen levothyroxine (SYNTHROID, LEVOTHROID) 125 MCG tablet Take 125 mcg by mouth daily.        Marland Kitchen losartan-hydrochlorothiazide (HYZAAR) 50-12.5 MG per tablet Take 1 tablet by mouth daily.        . meloxicam (MOBIC) 15 MG tablet Take 15 mg by mouth daily.        Marland Kitchen  omeprazole (PRILOSEC) 20 MG capsule Take 20 mg by mouth daily.        . traMADol (ULTRAM) 50 MG tablet One by mouth every 6-8 hours as needed       . Respiratory Therapy Supplies (FLUTTER) DEVI Use three times daily  1 each  0  . DISCONTD: predniSONE (DELTASONE) 10 MG tablet Reduce to 1 alternate with 1/2 tab every other day for 7 days then reduce to 1/2 tab daily for 7days then 1/2 every other day for 7days then stop  60 tablet  3         Plan:

## 2011-01-25 ENCOUNTER — Telehealth: Payer: Self-pay | Admitting: Critical Care Medicine

## 2011-01-25 NOTE — Telephone Encounter (Signed)
Dr. Delford Field, pt needs a form completed for pulmonary clearance for a total hip arthroscopy.  He was last seen by you on 01/04/2011.  Do you want him to come in for another OV for this or no?  He is aware you are off until next week and ok with this.  Form has been placed in your to do folder.  Pls advise. Thanks!

## 2011-01-26 NOTE — Telephone Encounter (Signed)
noted 

## 2011-01-29 HISTORY — PX: TOTAL HIP ARTHROPLASTY: SHX124

## 2011-01-31 ENCOUNTER — Telehealth: Payer: Self-pay | Admitting: *Deleted

## 2011-01-31 NOTE — Telephone Encounter (Signed)
Surgical clearance form completed and

## 2011-02-15 ENCOUNTER — Encounter (HOSPITAL_COMMUNITY)
Admission: RE | Admit: 2011-02-15 | Discharge: 2011-02-15 | Disposition: A | Discharge status: Home / Self Care | Payer: Medicare HMO | Source: Ambulatory Visit | Attending: Orthopedic Surgery | Admitting: Orthopedic Surgery

## 2011-02-15 LAB — URINALYSIS, ROUTINE W REFLEX MICROSCOPIC
Bilirubin Urine: NEGATIVE
Glucose, UA: NEGATIVE mg/dL
Ketones, ur: NEGATIVE mg/dL
Leukocytes, UA: NEGATIVE
Nitrite: NEGATIVE
Protein, ur: NEGATIVE mg/dL
Specific Gravity, Urine: 1.022 (ref 1.005–1.030)
Urobilinogen, UA: 1 mg/dL (ref 0.0–1.0)
pH: 6 (ref 5.0–8.0)

## 2011-02-15 LAB — COMPREHENSIVE METABOLIC PANEL
ALT: 29 U/L (ref 0–53)
AST: 21 U/L (ref 0–37)
Albumin: 4 g/dL (ref 3.5–5.2)
Alkaline Phosphatase: 91 U/L (ref 39–117)
BUN: 13 mg/dL (ref 6–23)
CO2: 30 mEq/L (ref 19–32)
Calcium: 9.7 mg/dL (ref 8.4–10.5)
Chloride: 103 mEq/L (ref 96–112)
Creatinine, Ser: 1.44 mg/dL — ABNORMAL HIGH (ref 0.50–1.35)
GFR calc Af Amer: 57 mL/min — ABNORMAL LOW (ref >90)
GFR calc non Af Amer: 50 mL/min — ABNORMAL LOW (ref >90)
Glucose, Bld: 79 mg/dL (ref 70–99)
Potassium: 4.6 mEq/L (ref 3.5–5.1)
Sodium: 143 mEq/L (ref 135–145)
Total Bilirubin: 0.4 mg/dL (ref 0.3–1.2)
Total Protein: 6.9 g/dL (ref 6.0–8.3)

## 2011-02-15 LAB — URINE MICROSCOPIC-ADD ON

## 2011-02-15 LAB — CBC
HCT: 47.7 % (ref 39.0–52.0)
Hemoglobin: 16.8 g/dL (ref 13.0–17.0)
MCH: 32.6 pg (ref 26.0–34.0)
MCHC: 35.2 g/dL (ref 30.0–36.0)
MCV: 92.6 fL (ref 78.0–100.0)
Platelets: 206 10*3/uL (ref 150–400)
RBC: 5.15 MIL/uL (ref 4.22–5.81)
RDW: 13.1 % (ref 11.5–15.5)
WBC: 12 10*3/uL — ABNORMAL HIGH (ref 4.0–10.5)

## 2011-02-15 LAB — DIFFERENTIAL
Basophils Absolute: 0.1 10*3/uL (ref 0.0–0.1)
Basophils Relative: 1 % (ref 0–1)
Eosinophils Absolute: 0.2 10*3/uL (ref 0.0–0.7)
Eosinophils Relative: 2 % (ref 0–5)
Lymphocytes Relative: 43 % (ref 12–46)
Lymphs Abs: 5.1 10*3/uL — ABNORMAL HIGH (ref 0.7–4.0)
Monocytes Absolute: 0.9 10*3/uL (ref 0.1–1.0)
Monocytes Relative: 7 % (ref 3–12)
Neutro Abs: 5.8 10*3/uL (ref 1.7–7.7)
Neutrophils Relative %: 48 % (ref 43–77)

## 2011-02-15 LAB — SURGICAL PCR SCREEN
MRSA, PCR: NEGATIVE
Staphylococcus aureus: NEGATIVE

## 2011-02-15 LAB — PROTIME-INR
INR: 0.86 (ref 0.00–1.49)
Prothrombin Time: 11.9 seconds (ref 11.6–15.2)

## 2011-02-15 LAB — APTT: aPTT: 29 seconds (ref 24–37)

## 2011-02-16 LAB — URINE CULTURE
Colony Count: NO GROWTH
Culture  Setup Time: 201210181700
Culture: NO GROWTH

## 2011-02-23 ENCOUNTER — Inpatient Hospital Stay (HOSPITAL_COMMUNITY)
Admission: RE | Admit: 2011-02-23 | Discharge: 2011-02-26 | DRG: 470 | Disposition: A | Discharge status: Home Health | Payer: Medicare HMO | Source: Ambulatory Visit | Attending: Orthopedic Surgery | Admitting: Orthopedic Surgery

## 2011-02-23 ENCOUNTER — Inpatient Hospital Stay (HOSPITAL_COMMUNITY): Payer: Medicare HMO

## 2011-02-23 DIAGNOSIS — J4489 Other specified chronic obstructive pulmonary disease: Secondary | ICD-10-CM | POA: Diagnosis present

## 2011-02-23 DIAGNOSIS — M1610 Unilateral primary osteoarthritis, unspecified hip: Principal | ICD-10-CM | POA: Diagnosis present

## 2011-02-23 DIAGNOSIS — M169 Osteoarthritis of hip, unspecified: Principal | ICD-10-CM | POA: Diagnosis present

## 2011-02-23 DIAGNOSIS — I10 Essential (primary) hypertension: Secondary | ICD-10-CM | POA: Diagnosis present

## 2011-02-23 DIAGNOSIS — K219 Gastro-esophageal reflux disease without esophagitis: Secondary | ICD-10-CM | POA: Diagnosis present

## 2011-02-23 DIAGNOSIS — E876 Hypokalemia: Secondary | ICD-10-CM | POA: Diagnosis not present

## 2011-02-23 DIAGNOSIS — J449 Chronic obstructive pulmonary disease, unspecified: Secondary | ICD-10-CM | POA: Diagnosis present

## 2011-02-23 DIAGNOSIS — F17200 Nicotine dependence, unspecified, uncomplicated: Secondary | ICD-10-CM | POA: Diagnosis present

## 2011-02-23 DIAGNOSIS — Z01812 Encounter for preprocedural laboratory examination: Secondary | ICD-10-CM

## 2011-02-23 DIAGNOSIS — Z79899 Other long term (current) drug therapy: Secondary | ICD-10-CM

## 2011-02-23 DIAGNOSIS — Z880 Allergy status to penicillin: Secondary | ICD-10-CM

## 2011-02-23 LAB — TYPE AND SCREEN
ABO/RH(D): A POS
Antibody Screen: NEGATIVE

## 2011-02-23 LAB — ABO/RH: ABO/RH(D): A POS

## 2011-02-24 ENCOUNTER — Inpatient Hospital Stay (HOSPITAL_COMMUNITY): Payer: Medicare HMO

## 2011-02-24 LAB — CBC
HCT: 42 % (ref 39.0–52.0)
Hemoglobin: 14.3 g/dL (ref 13.0–17.0)
MCH: 31.7 pg (ref 26.0–34.0)
MCHC: 34 g/dL (ref 30.0–36.0)
MCV: 93.1 fL (ref 78.0–100.0)
Platelets: 213 10*3/uL (ref 150–400)
RBC: 4.51 MIL/uL (ref 4.22–5.81)
RDW: 13.1 % (ref 11.5–15.5)
WBC: 15.1 10*3/uL — ABNORMAL HIGH (ref 4.0–10.5)

## 2011-02-24 LAB — BASIC METABOLIC PANEL
BUN: 17 mg/dL (ref 6–23)
CO2: 28 mEq/L (ref 19–32)
Calcium: 9.1 mg/dL (ref 8.4–10.5)
Chloride: 98 mEq/L (ref 96–112)
Creatinine, Ser: 1.52 mg/dL — ABNORMAL HIGH (ref 0.50–1.35)
GFR calc Af Amer: 54 mL/min — ABNORMAL LOW (ref >90)
GFR calc non Af Amer: 46 mL/min — ABNORMAL LOW (ref >90)
Glucose, Bld: 128 mg/dL — ABNORMAL HIGH (ref 70–99)
Potassium: 4.5 mEq/L (ref 3.5–5.1)
Sodium: 138 mEq/L (ref 135–145)

## 2011-02-25 LAB — BASIC METABOLIC PANEL
BUN: 14 mg/dL (ref 6–23)
CO2: 28 mEq/L (ref 19–32)
Calcium: 9.2 mg/dL (ref 8.4–10.5)
Chloride: 96 mEq/L (ref 96–112)
Creatinine, Ser: 1.25 mg/dL (ref 0.50–1.35)
GFR calc Af Amer: 68 mL/min — ABNORMAL LOW (ref >90)
GFR calc non Af Amer: 59 mL/min — ABNORMAL LOW (ref >90)
Glucose, Bld: 134 mg/dL — ABNORMAL HIGH (ref 70–99)
Potassium: 3.3 mEq/L — ABNORMAL LOW (ref 3.5–5.1)
Sodium: 134 mEq/L — ABNORMAL LOW (ref 135–145)

## 2011-02-25 LAB — CBC
HCT: 36.4 % — ABNORMAL LOW (ref 39.0–52.0)
Hemoglobin: 12.6 g/dL — ABNORMAL LOW (ref 13.0–17.0)
MCH: 31.7 pg (ref 26.0–34.0)
MCHC: 34.6 g/dL (ref 30.0–36.0)
MCV: 91.5 fL (ref 78.0–100.0)
Platelets: 167 10*3/uL (ref 150–400)
RBC: 3.98 MIL/uL — ABNORMAL LOW (ref 4.22–5.81)
RDW: 13 % (ref 11.5–15.5)
WBC: 13.2 10*3/uL — ABNORMAL HIGH (ref 4.0–10.5)

## 2011-02-26 LAB — CBC
HCT: 35.8 % — ABNORMAL LOW (ref 39.0–52.0)
Hemoglobin: 12.6 g/dL — ABNORMAL LOW (ref 13.0–17.0)
MCH: 31.7 pg (ref 26.0–34.0)
MCHC: 35.2 g/dL (ref 30.0–36.0)
MCV: 89.9 fL (ref 78.0–100.0)
Platelets: 183 10*3/uL (ref 150–400)
RBC: 3.98 MIL/uL — ABNORMAL LOW (ref 4.22–5.81)
RDW: 12.9 % (ref 11.5–15.5)
WBC: 14.3 10*3/uL — ABNORMAL HIGH (ref 4.0–10.5)

## 2011-02-26 LAB — BASIC METABOLIC PANEL
BUN: 13 mg/dL (ref 6–23)
CO2: 30 mEq/L (ref 19–32)
Calcium: 9 mg/dL (ref 8.4–10.5)
Chloride: 94 mEq/L — ABNORMAL LOW (ref 96–112)
Creatinine, Ser: 1.28 mg/dL (ref 0.50–1.35)
GFR calc Af Amer: 66 mL/min — ABNORMAL LOW (ref >90)
GFR calc non Af Amer: 57 mL/min — ABNORMAL LOW (ref >90)
Glucose, Bld: 119 mg/dL — ABNORMAL HIGH (ref 70–99)
Potassium: 3 mEq/L — ABNORMAL LOW (ref 3.5–5.1)
Sodium: 134 mEq/L — ABNORMAL LOW (ref 135–145)

## 2011-02-27 NOTE — Op Note (Signed)
  NAMESONYA, PUCCI NO.:  [id number]  MEDICAL RECORD NO.:  [id number]  LOCATION:  2899                         FACILITY:  [facility]  PHYSICIAN:  Dyke Brackett, M.D.    DATE OF BIRTH:  07/03/1945  DATE OF PROCEDURE:  02/23/2011 DATE OF DISCHARGE:                              OPERATIVE REPORT   PREOPERATIVE DIAGNOSIS:  Osteoarthritis, right hip.  POSTOPERATIVE DIAGNOSIS:  Osteoarthritis, right hip.  OPERATION:  Right total hip replacement with Prodigy 50 mm large stature stem with a +5, 1.5 mm neck length, 54-mm acetabulum with marathon poly.  SURGEON:  Dyke Brackett, MD  FIRST ASSISTANT:  Margart Sickles, PA-C  SECOND ASSISTANT:  Laural Benes. Su Hilt, [place]  ESTIMATED BLOOD LOSS:  Approximately 300.  DESCRIPTION OF PROCEDURE:  After sterile prep and drape and lateral positioning, posterior approach to the hip made with splitting the iliotibial band.  Short external rotators, contraction of sciatic nerve. We cut the femur about 1 fingerbreadth above the lesser trochanter femoral head.  We then progressively reamed and rasped to accept a 15 mm large stature stem with a 0.5 mm under reaming on the femur.  Acetabular retractors were placed anteriorly and inferiorly.  Two 2 wing retractors were placed that were removed.  We resected the labrum, exposed the acetabulum, and then progressively reamed the acetabulum in about 15-20 degrees of anteversion with about 55 degrees of abduction with 8 mm under reaming of 53 to accept a 54 mm acetabulum.  After trials were attempted, we then placed the final cup with a trial poly and then used the femoral rasp as a trial eventually settling on a Prodigy stem to ensure stability to really not be dislocated except at extreme 100 degrees hip flexion, internal rotation to 90, and full adduction with tendency to sublux.  Estimated leg length to be approximately equal.  The final components relative to the poly on the acetabulum and  the femoral stem were removed.  We then inserted the apex hole screw and the final marathon poly followed by the final Prodigy stem.  Then once we trialed off the stem, there was some slight change in the anteversion stem with probably a couple mm proud at least on once bony surface and therefore we went with a +1.5 mm neck length over the 5, which we trialed before.  Final head was inserted as well, and all parameters were acceptable in stability.  Wounds were copiously irrigated.  Closure of the capsule with mid T capsulotomy, closed it with #1 Ethibond on the fascia and 2-0 Vicryl, and skin clips.  Marcaine without epinephrine on the wound.  Light pressure sterile dressing applied.     Dyke Brackett, M.D.     WDC/MEDQ  D:  02/23/2011  T:  02/23/2011  Job:  [id number]  Electronically Signed by W. Stormie Ventola M.D. on 02/27/2011 09:10:22 AM

## 2011-03-15 NOTE — Discharge Summary (Signed)
NAMEAVERY, KLINGBEIL NO.:  [id number]  MEDICAL RECORD NO.:  [id number]  LOCATION:  5015                         FACILITY:  [facility]  PHYSICIAN:  Dyke Brackett, M.D.    DATE OF BIRTH:  Jul 16, 1945  DATE OF ADMISSION:  02/23/2011 DATE OF DISCHARGE:  02/26/2011                              DISCHARGE SUMMARY   DIAGNOSIS:  End-stage osteoarthritis, right hip.  SECONDARY DIAGNOSES: 1. Chronic obstructive pulmonary disease. 2. Bronchiolitis obliterans with organizing pneumonia.  HISTORY OF PRESENT ILLNESS:  Mr. Sharps is a 65 year old male with a history of right hip osteoarthritis with failed conservative treatment with injections,  antiinflammatories.  There was affecting his ADLs and did wake him from sleep at night.  He had a past medical history which includes COPD and BOOP.  He obtained medical and pulmonary clearance as well as cardiac clearance and was planned for right total hip replacement.  Preoperative labs including CBC, CMET, chest x-ray, EKG, PT, and PTT were all within acceptable limits.  On the date of hospitalization, the patient was taken to the operating room and he underwent a right total hip arthroplasty using DePuy Prodigy.  The patient was placed on perioperative antibiotics.  He was also placed on postoperative Lovenox for DVT prophylaxis, started on Dilaudid PCA for pain control.  He was transferred to the PACU in stable condition, then later transferred to the nursing unit.  On postoperative day #1, he had some troubles with nausea and vomiting.  He was given Zofran, which did not help.  The doctor on-call was then contacted.  At which point, I discontinued his PCA pump and he was given tramadol and Phenergan.  This did help with his nausea.  He has moderate pain with the tramadol, but not tolerating narcotics well, 0.5 mg Dilaudid IV push ordered, was also in place.  He worked with physical therapy and occupational therapy, but was limited  due to his nausea and vomiting. He was having constipation and had somewhat distended abdomen.  KUB was ordered to rule out ileus, which was negative.  His Foley catheter was also discharged.  Postoperative day #2, the patient was feeling much better.  His vital signs were stable.  Dressing was changed and drain pulled.  He was started on Tylenol for pain control.  He had a bit of hypokalemia at 3.3.  He was given p.o. potassium.  Postoperative day #3, he was tolerating the diet, still no bowel movement for positive flatus. Still hypokalemia at 3.0, again given a p.o. dose of potassium.  His dressing was changed.  Wound was clean, dry, and intact.  Staples in place.  The patient was discharged home with home health, PT.  He was given instructions to continue his home medications.  He was also given a prescriptions for Lovenox b.i.d. for DVT prophylaxis for 2 weeks postop, tramadol for pain control, and Robaxin for muscle spasms.  He was instructed to follow up with Dr. Madelon Lips in 10-14 days.  He was allowed to shower postoperative day #5, washing over surgical incision with soap and water, no soaking in tubs and then, covered incision with clean, dry dressing.  He was allowed weightbearing as  tolerated on posterior hip dislocation precautions.  He was instructed to call the office with any concerns for infection or DVT, which were discussed with him before discharge home.    ______________________________ Margart Sickles, PA-C   ______________________________ Dyke Brackett, M.D.    JC/MEDQ  D:  03/14/2011  T:  03/14/2011  Job:  [id number]

## 2011-04-09 ENCOUNTER — Ambulatory Visit: Payer: Medicare HMO | Attending: Orthopedic Surgery | Admitting: Physical Therapy

## 2011-04-09 DIAGNOSIS — M6281 Muscle weakness (generalized): Secondary | ICD-10-CM | POA: Insufficient documentation

## 2011-04-09 DIAGNOSIS — M25559 Pain in unspecified hip: Secondary | ICD-10-CM | POA: Insufficient documentation

## 2011-04-09 DIAGNOSIS — IMO0001 Reserved for inherently not codable concepts without codable children: Secondary | ICD-10-CM | POA: Insufficient documentation

## 2011-04-09 DIAGNOSIS — R269 Unspecified abnormalities of gait and mobility: Secondary | ICD-10-CM | POA: Insufficient documentation

## 2011-04-09 DIAGNOSIS — R5381 Other malaise: Secondary | ICD-10-CM | POA: Insufficient documentation

## 2011-04-11 ENCOUNTER — Ambulatory Visit: Payer: Medicare HMO | Admitting: Physical Therapy

## 2011-04-13 ENCOUNTER — Ambulatory Visit: Payer: Medicare HMO | Admitting: Physical Therapy

## 2011-04-19 ENCOUNTER — Ambulatory Visit: Payer: Medicare HMO | Admitting: Physical Therapy

## 2011-04-20 ENCOUNTER — Ambulatory Visit: Payer: Medicare HMO | Admitting: *Deleted

## 2011-04-25 ENCOUNTER — Ambulatory Visit: Payer: Medicare HMO | Admitting: Physical Therapy

## 2011-04-27 ENCOUNTER — Ambulatory Visit: Payer: Medicare HMO | Admitting: Physical Therapy

## 2011-04-30 ENCOUNTER — Ambulatory Visit: Payer: Medicare HMO | Admitting: Physical Therapy

## 2011-05-02 ENCOUNTER — Encounter: Payer: Medicare HMO | Admitting: Physical Therapy

## 2011-05-03 ENCOUNTER — Ambulatory Visit: Payer: Medicare HMO | Attending: Orthopedic Surgery | Admitting: *Deleted

## 2011-05-03 DIAGNOSIS — M25559 Pain in unspecified hip: Secondary | ICD-10-CM | POA: Insufficient documentation

## 2011-05-03 DIAGNOSIS — R269 Unspecified abnormalities of gait and mobility: Secondary | ICD-10-CM | POA: Insufficient documentation

## 2011-05-03 DIAGNOSIS — IMO0001 Reserved for inherently not codable concepts without codable children: Secondary | ICD-10-CM | POA: Insufficient documentation

## 2011-05-03 DIAGNOSIS — M6281 Muscle weakness (generalized): Secondary | ICD-10-CM | POA: Insufficient documentation

## 2011-05-03 DIAGNOSIS — R5381 Other malaise: Secondary | ICD-10-CM | POA: Insufficient documentation

## 2011-05-04 ENCOUNTER — Ambulatory Visit: Payer: Medicare HMO | Admitting: *Deleted

## 2011-05-07 ENCOUNTER — Ambulatory Visit: Payer: Medicare HMO | Admitting: Physical Therapy

## 2011-05-09 ENCOUNTER — Ambulatory Visit: Payer: Medicare HMO | Admitting: Physical Therapy

## 2011-06-07 ENCOUNTER — Encounter: Payer: Self-pay | Admitting: Critical Care Medicine

## 2011-06-07 ENCOUNTER — Ambulatory Visit (INDEPENDENT_AMBULATORY_CARE_PROVIDER_SITE_OTHER): Payer: Medicare HMO | Admitting: Critical Care Medicine

## 2011-06-07 ENCOUNTER — Ambulatory Visit (HOSPITAL_BASED_OUTPATIENT_CLINIC_OR_DEPARTMENT_OTHER)
Admission: RE | Admit: 2011-06-07 | Discharge: 2011-06-07 | Disposition: A | Discharge status: Home / Self Care | Payer: Medicare HMO | Source: Ambulatory Visit | Attending: Critical Care Medicine | Admitting: Critical Care Medicine

## 2011-06-07 VITALS — BP 130/82 | HR 90 | Temp 97.9°F | Ht 69.0 in | Wt 208.0 lb

## 2011-06-07 DIAGNOSIS — J449 Chronic obstructive pulmonary disease, unspecified: Secondary | ICD-10-CM

## 2011-06-07 DIAGNOSIS — J4489 Other specified chronic obstructive pulmonary disease: Secondary | ICD-10-CM

## 2011-06-07 DIAGNOSIS — J8409 Other alveolar and parieto-alveolar conditions: Secondary | ICD-10-CM

## 2011-06-07 DIAGNOSIS — J8489 Other specified interstitial pulmonary diseases: Secondary | ICD-10-CM

## 2011-06-07 DIAGNOSIS — I10 Essential (primary) hypertension: Secondary | ICD-10-CM

## 2011-06-07 DIAGNOSIS — R911 Solitary pulmonary nodule: Secondary | ICD-10-CM

## 2011-06-07 MED ORDER — PREDNISONE 10 MG PO TABS: ORAL_TABLET | ORAL | Status: DC

## 2011-06-07 MED ORDER — LEVOFLOXACIN 500 MG PO TABS: 500.0000 mg | ORAL_TABLET | Freq: Every day | ORAL | Status: AC

## 2011-06-07 NOTE — Patient Instructions (Signed)
Start levaquin 500mg  daily for 7days (generic) Take prednisone 10mg  Take 4 for two days three for two days two for two days one for two days Stay on symbicort A Chest xray will be obtained Return 4 months

## 2011-06-07 NOTE — Assessment & Plan Note (Signed)
Boop now resolved.  Only residual upper lobe scar seen Plan Monitor clinically

## 2011-06-07 NOTE — Progress Notes (Signed)
Quick Note:  Called, spoke with pt.  I informed him Xray is stable, no pneumonia, and no evidence of any active BOOP per PW. Advised he recs no change in meds - he should continue them as prescribed at today's OV. Pt verbalized understanding of these results and recs and voiced no further questions/concerns at this time.  ______

## 2011-06-07 NOTE — Progress Notes (Signed)
Quick Note:  Notify the patient that the Xray is stable and no pneumonia No evidence of any active BOOP No change in medications are recommended. Continue current meds as prescribed at last office visit ______

## 2011-06-07 NOTE — Progress Notes (Signed)
Subjective:     Patient ID: Antonio Bowen, male   DOB: [birth year], 66 y.o.   MRN: [id number]  HPI 66 y.o.WM Hx Boop, copd   06/07/2011 Notes is worse over,  Notes more dyspnea with notes more cough.  Mucus is green brown.   Mucus for three weeks .  Saw PCP and chked for Flu and neg.  Rx TAmiflu and helped. ? If had fever. Had chills and sweats.  No real chest pain.   Notes some wheezing.  Notes nasal drainage, no discolored mucus out of nose.    Past Medical History  Diagnosis Date  . Bronchitis   . Tobacco dependence   . COPD (chronic obstructive pulmonary disease)   . Hypertension   . GERD (gastroesophageal reflux disease)   . Allergic rhinitis   . Low back pain      Family History  Problem Relation Age of Onset  . Arthritis Father   . Cancer Maternal Grandfather   . Prostate cancer Maternal Grandfather   . Cancer Maternal Grandmother     stomach  . Cancer Paternal Grandmother      History   Social History  . Marital Status: Married    Spouse Name: N/A    Number of Children: 2  . Years of Education: N/A   Occupational History  . 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 Smoking status: Former Smoker -- 1.0 packs/day for 50 years    Types: Cigarettes    Quit date: 07/30/2010  . Smokeless tobacco: Current User    Types: Snuff, Chew  . Alcohol Use: No  . Drug Use: No  . Sexually Active: Not on file   Other Topics Concern  . Not on file   Social History Narrative  . No narrative on file     Allergies  Allergen Reactions  . Nuts   . Vicodin (Hydrocodone-Acetaminophen)     tachycardia  . Aspirin     Upset stomach, bleeding  . Penicillins     Rash, hives     Outpatient Prescriptions Prior to Visit  Medication Sig Dispense Refill  . budesonide-formoterol (SYMBICORT) 160-4.5 MCG/ACT inhaler Inhale 2 puffs into the lungs 2 (two) times daily.  1 Inhaler  6  . levothyroxine (SYNTHROID,  LEVOTHROID) 125 MCG tablet Take 125 mcg by mouth daily.        Marland Kitchen losartan-hydrochlorothiazide (HYZAAR) 50-12.5 MG per tablet Take 1 tablet by mouth daily.        . meloxicam (MOBIC) 15 MG tablet Take 15 mg by mouth daily.        Marland Kitchen omeprazole (PRILOSEC) 20 MG capsule Take 20 mg by mouth daily.        Marland Kitchen Respiratory Therapy Supplies (FLUTTER) DEVI Use three times daily  1 each  0  . traMADol (ULTRAM) 50 MG tablet One by mouth every 6-8 hours as needed       . fluticasone (FLONASE) 50 MCG/ACT nasal spray 2 sprays by Nasal route daily as needed.          Review of Systems  Constitutional: Positive for fatigue. Negative for diaphoresis, activity change, appetite change and unexpected weight change.  HENT: Positive for voice change. Negative for hearing loss, nosebleeds, congestion, facial swelling, sneezing, mouth sores, trouble swallowing, neck stiffness, dental problem, sinus pressure, tinnitus and ear discharge.   Eyes: Negative for photophobia, discharge, itching and visual disturbance.  Respiratory: Positive for chest tightness. Negative for apnea, choking and stridor.   Cardiovascular: Negative for palpitations.  Gastrointestinal: Negative for nausea, constipation, blood in stool and abdominal distention.  Genitourinary: Negative for dysuria, urgency, frequency, hematuria, flank pain, decreased urine volume and difficulty urinating.  Musculoskeletal: Positive for back pain and gait problem. Negative for joint swelling and arthralgias.  Skin: Negative for color change and pallor.  Neurological: Positive for numbness. Negative for dizziness, tremors, seizures, syncope, speech difficulty, weakness and light-headedness.  Hematological: Negative for adenopathy. Does not bruise/bleed easily.  Psychiatric/Behavioral: Positive for sleep disturbance. Negative for confusion and agitation. The patient is nervous/anxious.        Objective:   Physical Exam  Filed Vitals:   06/07/11 1353  BP: 130/82   Pulse: 90  Temp: 97.9 F (36.6 C)  TempSrc: Oral  Height: 5\' 9"  (1.753 m)  Weight: 208 lb (94.348 kg)  SpO2: 96%    Gen: Pleasant, well-nourished, in no distress,  normal affect, hard of hearing  ENT: No lesions,  mouth clear,  oropharynx clear, no postnasal drip  Neck: No JVD, no TMG, no carotid bruits  Lungs: No use of accessory muscles, no dullness to percussion, distant BS  Cardiovascular: RRR, heart sounds normal, no murmur or gallops, no peripheral edema  Abdomen: soft and NT, no HSM,  BS normal  Musculoskeletal: No deformities, no cyanosis or clubbing  Neuro: alert, non focal  Skin: Warm, no lesions or rashes   Dg Chest 2 View  06/07/2011   *RADIOLOGY REPORT*  Clinical Data: COPD with asthma, boop and history of hypertension. Nonsmoker  CHEST - 2 VIEW  Comparison: 11/09/2010 and CT 08/28/2010  Findings: Heart and mediastinal contours are stable.  Linear scarring is less pronounced in the left upper lobe than on the prior exam.  Focal scarring with a more nodular component seen laterally on the right.  The area of nodularity appears stable in size in comparison with the prior exam from 10/2010 suggesting this is related to postinflammatory or postinfectious change.  No new infiltrates or signs of congestive failure are noted.  No pleural fluid or significant peribronchial cuffing is seen.  Bony structures appear intact.  IMPRESSION: Stable upper lobe scarring changes with right upper lobe nodule appearing stable radiographically.  As this nodule was not visualized prior to 07/2010, reassessment with chest CT should be considered in April 2013 to confirm that this has remained stable for one year.  No definite new focal or acute cardiopulmonary abnormalities identified.  Original Report Authenticated By: Bertha Stakes, M.D.      Assessment:     COPD (chronic obstructive pulmonary disease) Copd with improved airflow obstruction on spirometry 06/07/11 compared to  priors Recent acute tracheobronchitis with flare due to ILI illness.  Plan: Levaquin 500mg /d x 7days Pulse prednisone x 8days Cont Laba/ics Note CXR stable compared to 7/12: no BOOP flare  BOOP (bronchiolitis obliterans with organizing pneumonia) Boop now resolved.  Only residual upper lobe scar seen Plan Monitor clinically     Updated Medication List Outpatient Encounter Prescriptions as of 06/07/2011  Medication Sig Dispense Refill  . budesonide-formoterol (SYMBICORT) 160-4.5 MCG/ACT inhaler Inhale 2 puffs into the lungs 2 (two) times daily.  1 Inhaler  6  . levothyroxine (SYNTHROID, LEVOTHROID) 125 MCG tablet Take 125 mcg by mouth daily.        Marland Kitchen losartan-hydrochlorothiazide (HYZAAR) 50-12.5 MG per tablet Take 1 tablet by mouth daily.        . meloxicam (MOBIC)  15 MG tablet Take 15 mg by mouth daily.        . methocarbamol (ROBAXIN) 500 MG tablet Take by mouth Daily.      Marland Kitchen omeprazole (PRILOSEC) 20 MG capsule Take 20 mg by mouth daily.        Marland Kitchen Respiratory Therapy Supplies (FLUTTER) DEVI Use three times daily  1 each  0  . traMADol (ULTRAM) 50 MG tablet One by mouth every 6-8 hours as needed       . fluticasone (FLONASE) 50 MCG/ACT nasal spray 2 sprays by Nasal route daily as needed.       Marland Kitchen levofloxacin (LEVAQUIN) 500 MG tablet Take 1 tablet (500 mg total) by mouth daily.  7 tablet  0  . predniSONE (DELTASONE) 10 MG tablet Take 4 for two days three for two days two for two days one for two days  20 tablet  0         Plan:

## 2011-06-07 NOTE — Assessment & Plan Note (Signed)
Copd with improved airflow obstruction on spirometry 06/07/11 compared to priors Recent acute tracheobronchitis with flare due to ILI illness.  Plan: Levaquin 500mg /d x 7days Pulse prednisone x 8days Cont Laba/ics Note CXR stable compared to 7/12: no BOOP flare

## 2011-08-14 ENCOUNTER — Encounter: Payer: Self-pay | Admitting: Internal Medicine

## 2011-10-24 ENCOUNTER — Other Ambulatory Visit: Payer: Self-pay | Admitting: Critical Care Medicine

## 2011-11-22 ENCOUNTER — Ambulatory Visit (INDEPENDENT_AMBULATORY_CARE_PROVIDER_SITE_OTHER): Payer: Medicare HMO | Admitting: Critical Care Medicine

## 2011-11-22 ENCOUNTER — Encounter: Payer: Self-pay | Admitting: Critical Care Medicine

## 2011-11-22 VITALS — BP 150/92 | HR 75 | Temp 98.1°F | Ht 69.0 in | Wt 207.0 lb

## 2011-11-22 DIAGNOSIS — J449 Chronic obstructive pulmonary disease, unspecified: Secondary | ICD-10-CM

## 2011-11-22 NOTE — Patient Instructions (Addendum)
Work on your inhaler technique No change in medications We will check your oxygen level at night, you may need oxygen at night Return 2 months

## 2011-11-22 NOTE — Progress Notes (Signed)
Subjective:     Patient ID: Antonio Bowen, male   DOB: 09/07/1945, 66 y.o.   MRN: [id number]  HPI  66 y.o.WM Hx Boop, copd   11/22/2011 Pt feels worse since 2/13.  Awakens at night and panics.  No real cough.  Pt notes dyspnea during the day.  No air gets worse. More difficult time with golf.     Past Medical History  Diagnosis Date  . Bronchitis   . Tobacco dependence   . COPD (chronic obstructive pulmonary disease)   . Hypertension   . GERD (gastroesophageal reflux disease)   . Allergic rhinitis   . Low back pain      Family History  Problem Relation Age of Onset  . Arthritis Father   . Cancer Maternal Grandfather   . Prostate cancer Maternal Grandfather   . Cancer Maternal Grandmother     stomach  . Cancer Paternal Grandmother      History   Social History  . Marital Status: Married    Spouse Name: N/A    Number of Children: 2  . Years of Education: N/A   Occupational History  . 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 Smoking status: Former Smoker -- 1.0 packs/day for 50 years    Types: Cigarettes    Quit date: 07/30/2010  . Smokeless tobacco: Current User    Types: Snuff, Chew  . Alcohol Use: No  . Drug Use: No  . Sexually Active: Not on file   Other Topics Concern  . Not on file   Social History Narrative  . No narrative on file     Allergies  Allergen Reactions  . Nuts   . Vicodin (Hydrocodone-Acetaminophen)     tachycardia  . Aspirin     Upset stomach, bleeding  . Penicillins     Rash, hives     Outpatient Prescriptions Prior to Visit  Medication Sig Dispense Refill  . fluticasone (FLONASE) 50 MCG/ACT nasal spray 2 sprays by Nasal route daily as needed.       Marland Kitchen levothyroxine (SYNTHROID, LEVOTHROID) 125 MCG tablet Take 125 mcg by mouth daily.        Marland Kitchen losartan-hydrochlorothiazide (HYZAAR) 50-12.5 MG per tablet Take 1 tablet by mouth daily.        . meloxicam (MOBIC) 15  MG tablet Take 15 mg by mouth daily.        Marland Kitchen omeprazole (PRILOSEC) 20 MG capsule Take 20 mg by mouth daily.        Marland Kitchen Respiratory Therapy Supplies (FLUTTER) DEVI Use three times daily  1 each  0  . SYMBICORT 160-4.5 MCG/ACT inhaler INHALE TWO PUFFS BY MOUTH TWICE DAILY  1 Inhaler  2  . traMADol (ULTRAM) 50 MG tablet One by mouth every 6-8 hours as needed       . methocarbamol (ROBAXIN) 500 MG tablet Take by mouth Daily.      . predniSONE (DELTASONE) 10 MG tablet Take 4 for two days three for two days two for two days one for two days  20 tablet  0     Review of Systems  Constitutional: Positive for fatigue. Negative for diaphoresis, activity change, appetite change and unexpected weight change.  HENT: Positive for voice change. Negative for hearing loss, nosebleeds, congestion, facial swelling, sneezing, mouth sores, trouble swallowing, neck stiffness, dental problem, sinus pressure, tinnitus and ear discharge.   Eyes:  Negative for photophobia, discharge, itching and visual disturbance.  Respiratory: Positive for chest tightness. Negative for apnea, choking and stridor.   Cardiovascular: Negative for palpitations.  Gastrointestinal: Negative for nausea, constipation, blood in stool and abdominal distention.  Genitourinary: Negative for dysuria, urgency, frequency, hematuria, flank pain, decreased urine volume and difficulty urinating.  Musculoskeletal: Positive for back pain and gait problem. Negative for joint swelling and arthralgias.  Skin: Negative for color change and pallor.  Neurological: Positive for numbness. Negative for dizziness, tremors, seizures, syncope, speech difficulty, weakness and light-headedness.  Hematological: Negative for adenopathy. Does not bruise/bleed easily.  Psychiatric/Behavioral: Positive for disturbed wake/sleep cycle. Negative for confusion and agitation. The patient is nervous/anxious.        Objective:   Physical Exam  Filed Vitals:   11/22/11 1622    BP: 150/92  Pulse: 75  Temp: 98.1 F (36.7 C)  TempSrc: Oral  Height: 5\' 9"  (1.753 m)  Weight: 207 lb (93.895 kg)  SpO2: 98%    Gen: Pleasant, well-nourished, in no distress,  normal affect, hard of hearing  ENT: No lesions,  mouth clear,  oropharynx clear, no postnasal drip  Neck: No JVD, no TMG, no carotid bruits  Lungs: No use of accessory muscles, no dullness to percussion, distant BS  Cardiovascular: RRR, heart sounds normal, no murmur or gallops, no peripheral edema  Abdomen: soft and NT, no HSM,  BS normal  Musculoskeletal: No deformities, no cyanosis or clubbing  Neuro: alert, non focal  Skin: Warm, no lesions or rashes   No results found.    Assessment:     COPD (chronic obstructive pulmonary disease) Chronic obstructive lung disease with peripheral airflow obstruction however very poor HFA technique continues to be demonstrated Plan Extensive education the patient's inhaler technique was given The patient's to maintain Symbicort 2 puffs twice daily Overnight sleep oximetry will be obtained Return 4 months    Updated Medication List Outpatient Encounter Prescriptions as of 11/22/2011  Medication Sig Dispense Refill  . fluticasone (FLONASE) 50 MCG/ACT nasal spray 2 sprays by Nasal route daily as needed.       Marland Kitchen levothyroxine (SYNTHROID, LEVOTHROID) 125 MCG tablet Take 125 mcg by mouth daily.        Marland Kitchen losartan-hydrochlorothiazide (HYZAAR) 50-12.5 MG per tablet Take 1 tablet by mouth daily.        . meloxicam (MOBIC) 15 MG tablet Take 15 mg by mouth daily.        Marland Kitchen omeprazole (PRILOSEC) 20 MG capsule Take 20 mg by mouth daily.        . potassium chloride (K-DUR) 10 MEQ tablet Take 1 tablet by mouth Daily.      Marland Kitchen Respiratory Therapy Supplies (FLUTTER) DEVI Use three times daily  1 each  0  . SYMBICORT 160-4.5 MCG/ACT inhaler INHALE TWO PUFFS BY MOUTH TWICE DAILY  1 Inhaler  2  . traMADol (ULTRAM) 50 MG tablet One by mouth every 6-8 hours as needed       .  methocarbamol (ROBAXIN) 500 MG tablet Take by mouth Daily.      Marland Kitchen DISCONTD: predniSONE (DELTASONE) 10 MG tablet Take 4 for two days three for two days two for two days one for two days  20 tablet  0         Plan:

## 2011-11-23 NOTE — Assessment & Plan Note (Signed)
Chronic obstructive lung disease with peripheral airflow obstruction however very poor HFA technique continues to be demonstrated Plan Extensive education the patient's inhaler technique was given The patient's to maintain Symbicort 2 puffs twice daily Overnight sleep oximetry will be obtained Return 4 months

## 2011-12-06 ENCOUNTER — Telehealth: Payer: Self-pay | Admitting: Critical Care Medicine

## 2011-12-06 DIAGNOSIS — J449 Chronic obstructive pulmonary disease, unspecified: Secondary | ICD-10-CM

## 2011-12-06 NOTE — Telephone Encounter (Signed)
Call pt and tell him ONO pos for desat to 84% on Ra Oxygen 2L was ordered via [facility]

## 2011-12-07 NOTE — Telephone Encounter (Signed)
Spoke with patient and advised patient that per ONO, he desat to 84% on RA. Advised that Dr. Delford Field wanted to start o2 at 2 lpm qhs. Advised patient that the in network provider for his insurance was [place]. That Christoper Allegra will contact him to arrange delivery of oxygen. Pt is aware of results and of order. Rhonda J Cobb

## 2011-12-12 ENCOUNTER — Encounter: Payer: Self-pay | Admitting: Critical Care Medicine

## 2012-01-24 ENCOUNTER — Ambulatory Visit (INDEPENDENT_AMBULATORY_CARE_PROVIDER_SITE_OTHER): Payer: Medicare HMO | Admitting: Critical Care Medicine

## 2012-01-24 ENCOUNTER — Encounter: Payer: Self-pay | Admitting: Critical Care Medicine

## 2012-01-24 VITALS — BP 122/80 | HR 92 | Temp 98.7°F | Ht 69.0 in | Wt 205.0 lb

## 2012-01-24 DIAGNOSIS — Z23 Encounter for immunization: Secondary | ICD-10-CM

## 2012-01-24 DIAGNOSIS — J449 Chronic obstructive pulmonary disease, unspecified: Secondary | ICD-10-CM

## 2012-01-24 NOTE — Patient Instructions (Addendum)
Stay on oxygen at night Stay on symbicort Flu vaccine and pneumovax given Use claritin (loratidine) or zyrtec for next one month (over the counter) in addition to flonase Return 4 months

## 2012-01-24 NOTE — Progress Notes (Signed)
Subjective:     Patient ID: Antonio Bowen, male   DOB: 1945/05/05, 66 y.o.   MRN: [id number]  HPI  66 y.o.WM Hx Boop, copd  01/24/2012 Doing better with oxygen at night. Pt notes some fatigue during the day. Pt notes small amount of mucus and some pndrip.  Notes anterior rhinorhea.  No real sneeze.  No real chest pain Pt feels tight in chest.  Dyspnea is the same.  CAT 20 Pt denies any significant sore throat, nasal congestion or excess secretions, fever, chills, sweats, unintended weight loss, pleurtic or exertional chest pain, orthopnea PND, or leg swelling Pt denies any increase in rescue therapy over baseline, denies waking up needing it or having any early am or nocturnal exacerbations of coughing/wheezing/or dyspnea. Pt also denies any obvious fluctuation in symptoms with  weather or environmental change or other alleviating or aggravating factors     Past Medical History  Diagnosis Date  . Bronchitis   . Tobacco dependence   . COPD (chronic obstructive pulmonary disease)   . Hypertension   . GERD (gastroesophageal reflux disease)   . Allergic rhinitis   . Low back pain      Family History  Problem Relation Age of Onset  . Arthritis Father   . Cancer Maternal Grandfather   . Prostate cancer Maternal Grandfather   . Cancer Maternal Grandmother     stomach  . Cancer Paternal Grandmother      History   Social History  . Marital Status: Married    Spouse Name: N/A    Number of Children: 2  . Years of Education: N/A   Occupational History  . 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 Smoking status: Former Smoker -- 1.0 packs/day for 50 years    Types: Cigarettes    Quit date: 07/30/2010  . Smokeless tobacco: Current User    Types: Snuff, Chew  . Alcohol Use: No  . Drug Use: No  . Sexually Active: Not on file   Other Topics Concern  . Not on file   Social History Narrative  . No narrative  on file     Allergies  Allergen Reactions  . Nuts   . Vicodin (Hydrocodone-Acetaminophen)     tachycardia  . Aspirin     Upset stomach, bleeding  . Penicillins     Rash, hives     Outpatient Prescriptions Prior to Visit  Medication Sig Dispense Refill  . fluticasone (FLONASE) 50 MCG/ACT nasal spray 2 sprays by Nasal route daily as needed.       Marland Kitchen levothyroxine (SYNTHROID, LEVOTHROID) 125 MCG tablet Take 125 mcg by mouth daily.        Marland Kitchen losartan-hydrochlorothiazide (HYZAAR) 50-12.5 MG per tablet Take 1 tablet by mouth daily.        . meloxicam (MOBIC) 15 MG tablet Take 15 mg by mouth daily.        . methocarbamol (ROBAXIN) 500 MG tablet Take by mouth Daily.      Marland Kitchen omeprazole (PRILOSEC) 20 MG capsule Take 20 mg by mouth daily.        . SYMBICORT 160-4.5 MCG/ACT inhaler INHALE TWO PUFFS BY MOUTH TWICE DAILY  1 Inhaler  2  . traMADol (ULTRAM) 50 MG tablet One by mouth every 6-8 hours as needed       . Respiratory Therapy Supplies (FLUTTER) DEVI Use three times daily  1  each  0  . potassium chloride (K-DUR) 10 MEQ tablet Take 1 tablet by mouth Daily.         Review of Systems  Constitutional: Positive for fatigue. Negative for diaphoresis, activity change, appetite change and unexpected weight change.  HENT: Positive for voice change. Negative for hearing loss, nosebleeds, congestion, facial swelling, sneezing, mouth sores, trouble swallowing, neck stiffness, dental problem, sinus pressure, tinnitus and ear discharge.   Eyes: Negative for photophobia, discharge, itching and visual disturbance.  Respiratory: Positive for chest tightness. Negative for apnea, choking and stridor.   Cardiovascular: Negative for palpitations.  Gastrointestinal: Negative for nausea, constipation, blood in stool and abdominal distention.  Genitourinary: Negative for dysuria, urgency, frequency, hematuria, flank pain, decreased urine volume and difficulty urinating.  Musculoskeletal: Positive for back pain  and gait problem. Negative for joint swelling and arthralgias.  Skin: Negative for color change and pallor.  Neurological: Positive for numbness. Negative for dizziness, tremors, seizures, syncope, speech difficulty, weakness and light-headedness.  Hematological: Negative for adenopathy. Does not bruise/bleed easily.  Psychiatric/Behavioral: Positive for disturbed wake/sleep cycle. Negative for confusion and agitation. The patient is nervous/anxious.        Objective:   Physical Exam  Filed Vitals:   01/24/12 0945  BP: 122/80  Pulse: 92  Temp: 98.7 F (37.1 C)  TempSrc: Oral  Height: 5\' 9"  (1.753 m)  Weight: 205 lb (92.987 kg)  SpO2: 97%    Gen: Pleasant, well-nourished, in no distress,  normal affect, hard of hearing  ENT: No lesions,  mouth clear,  oropharynx clear, no postnasal drip  Neck: No JVD, no TMG, no carotid bruits  Lungs: No use of accessory muscles, no dullness to percussion, distant BS  Cardiovascular: RRR, heart sounds normal, no murmur or gallops, no peripheral edema  Abdomen: soft and NT, no HSM,  BS normal  Musculoskeletal: No deformities, no cyanosis or clubbing  Neuro: alert, non focal  Skin: Warm, no lesions or rashes   No results found.    Assessment:     Gold B COPD  Moderate chronic obstructive lung disease gold stage B. with nocturnal oxygen desaturation improved with therapy CAT 20 01/24/2012 Plan Stay on oxygen at night Stay on symbicort Flu vaccine and pneumovax given Use claritin (loratidine) or zyrtec for next one month (over the counter) in addition to flonase Return 4 months     Updated Medication List Outpatient Encounter Prescriptions as of 01/24/2012  Medication Sig Dispense Refill  . fluticasone (FLONASE) 50 MCG/ACT nasal spray 2 sprays by Nasal route daily as needed.       Marland Kitchen levothyroxine (SYNTHROID, LEVOTHROID) 125 MCG tablet Take 125 mcg by mouth daily.        Marland Kitchen losartan-hydrochlorothiazide (HYZAAR) 50-12.5 MG per  tablet Take 1 tablet by mouth daily.        . meloxicam (MOBIC) 15 MG tablet Take 15 mg by mouth daily.        . methocarbamol (ROBAXIN) 500 MG tablet Take by mouth Daily.      Marland Kitchen omeprazole (PRILOSEC) 20 MG capsule Take 20 mg by mouth daily.        . SYMBICORT 160-4.5 MCG/ACT inhaler INHALE TWO PUFFS BY MOUTH TWICE DAILY  1 Inhaler  2  . traMADol (ULTRAM) 50 MG tablet One by mouth every 6-8 hours as needed       . Respiratory Therapy Supplies (FLUTTER) DEVI Use three times daily  1 each  0  . DISCONTD: potassium chloride (K-DUR) 10 MEQ  tablet Take 1 tablet by mouth Daily.             Plan:

## 2012-01-24 NOTE — Assessment & Plan Note (Addendum)
Moderate chronic obstructive lung disease gold stage B. with nocturnal oxygen desaturation improved with therapy CAT 20 01/24/2012 Plan Stay on oxygen at night Stay on symbicort Flu vaccine and pneumovax given Use claritin (loratidine) or zyrtec for next one month (over the counter) in addition to flonase Return 4 months

## 2012-05-29 ENCOUNTER — Ambulatory Visit (INDEPENDENT_AMBULATORY_CARE_PROVIDER_SITE_OTHER): Payer: Medicare Other | Admitting: Critical Care Medicine

## 2012-05-29 ENCOUNTER — Ambulatory Visit (HOSPITAL_BASED_OUTPATIENT_CLINIC_OR_DEPARTMENT_OTHER)
Admission: RE | Admit: 2012-05-29 | Discharge: 2012-05-29 | Disposition: A | Discharge status: Home / Self Care | Payer: Medicare Other | Source: Ambulatory Visit | Attending: Critical Care Medicine | Admitting: Critical Care Medicine

## 2012-05-29 ENCOUNTER — Encounter: Payer: Self-pay | Admitting: Critical Care Medicine

## 2012-05-29 VITALS — BP 140/82 | HR 82 | Temp 98.2°F | Ht 69.0 in | Wt 216.0 lb

## 2012-05-29 DIAGNOSIS — J189 Pneumonia, unspecified organism: Secondary | ICD-10-CM

## 2012-05-29 DIAGNOSIS — J449 Chronic obstructive pulmonary disease, unspecified: Secondary | ICD-10-CM

## 2012-05-29 DIAGNOSIS — R059 Cough, unspecified: Secondary | ICD-10-CM | POA: Insufficient documentation

## 2012-05-29 DIAGNOSIS — R0602 Shortness of breath: Secondary | ICD-10-CM | POA: Insufficient documentation

## 2012-05-29 DIAGNOSIS — R05 Cough: Secondary | ICD-10-CM | POA: Insufficient documentation

## 2012-05-29 MED ORDER — PREDNISONE 10 MG PO TABS: ORAL_TABLET | ORAL | Status: DC

## 2012-05-29 MED ORDER — OMEPRAZOLE 20 MG PO CPDR: 20.0000 mg | DELAYED_RELEASE_CAPSULE | Freq: Two times a day (BID) | ORAL | Status: DC

## 2012-05-29 MED ORDER — LEVOFLOXACIN 500 MG PO TABS: 500.0000 mg | ORAL_TABLET | Freq: Every day | ORAL | Status: DC

## 2012-05-29 NOTE — Assessment & Plan Note (Signed)
Gold stage B. COPD with prior history of bronchiolitis obliterans organized pneumonia now with acute on chronic sinusitis and upper airway instability with cyclical cough. No chest x-ray does not show acute process on today's visit 05/29/2012. Reflux disease is also playing a role Plan Increase omeprazole to one twice daily before meals (see printed Rx for refills) STRICT reflux diet Levaquin 500mg  daily for 7days and Prednisone 10mg  Take 4 for two days three for two days two for two days one for two days   Stay on symbicort Use flutter valve with symbicort twice daily Stay on netipot Stay on nasonex ENT evaluation of sinuses Return 4 months or sooner if unimproved

## 2012-05-29 NOTE — Progress Notes (Signed)
Quick Note:  Notify the patient that the Xray is stable and no pneumonia. Take meds as prescribed at OV today  ______

## 2012-05-29 NOTE — Progress Notes (Signed)
Quick Note:  lmomtcb on pt's home and cell #s ______ 

## 2012-05-29 NOTE — Progress Notes (Signed)
Subjective:     Patient ID: Antonio Bowen, male   DOB: October 11, 1945, 67 y.o.   MRN: [id number]  HPI  67 y.o.WM Hx Boop, copd Gold B   01/24/2012 Doing better with oxygen at night. Pt notes some fatigue during the day. Pt notes small amount of mucus and some pndrip.  Notes anterior rhinorhea.  No real sneeze.  No real chest pain Pt feels tight in chest.  Dyspnea is the same.  CAT 20 Pt denies any significant sore throat, nasal congestion or excess secretions, fever, chills, sweats, unintended weight loss, pleurtic or exertional chest pain, orthopnea PND, or leg swelling Pt denies any increase in rescue therapy over baseline, denies waking up needing it or having any early am or nocturnal exacerbations of coughing/wheezing/or dyspnea. Pt also denies any obvious fluctuation in symptoms with  weather or environmental change or other alleviating or aggravating factors  05/29/2012 The pt has declined since last OV 9/13.  Seen pcp 2x.  Pt got ill about 3weeks ago. Symptoms were more dyspnea, chest tight, more cough esp at night. No fever. No body aches.  Notes more congestion in sinuses and lungs.  Pt seen pcp twice in 3 weeks.  Using neti pot and has helped.  Mucus is brown to white and blood (nasal and min from lungs)  Notes some pndrip.  Notes more hoarseness, worse as day goes on.  No sore throat. Notes some heartburn. Pt uses tums/rolaids and maalox in addition to daily PPI.  GI MD is Antonio Bowen.  No chest pain.  No real swelling in feet.      Past Medical History  Diagnosis Date  . Bronchitis   . Tobacco dependence   . COPD (chronic obstructive pulmonary disease)   . Hypertension   . GERD (gastroesophageal reflux disease)   . Allergic rhinitis   . Low back pain      Family History  Problem Relation Age of Onset  . Arthritis Father   . Cancer Maternal Grandfather   . Prostate cancer Maternal Grandfather   . Cancer Maternal Grandmother     stomach  . Cancer Paternal Grandmother       History   Social History  . Marital Status: Married    Spouse Name: N/A    Number of Children: 2  . Years of Education: N/A   Occupational History  . 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 Smoking status: Former Smoker -- 1.0 packs/day for 50 years    Types: Cigarettes    Quit date: 07/30/2010  . Smokeless tobacco: Current User    Types: Snuff, Chew  . Alcohol Use: No  . Drug Use: No  . Sexually Active: Not on file   Other Topics Concern  . Not on file   Social History Narrative  . No narrative on file     Allergies  Allergen Reactions  . Nuts   . Vicodin (Hydrocodone-Acetaminophen)     tachycardia  . Aspirin     Upset stomach, bleeding  . Penicillins     Rash, hives     Outpatient Prescriptions Prior to Visit  Medication Sig Dispense Refill  . fluticasone (FLONASE) 50 MCG/ACT nasal spray Place 2 sprays into the nose 2 (two) times daily.       Marland Kitchen levothyroxine (SYNTHROID, LEVOTHROID) 125 MCG tablet Take 125 mcg by mouth daily.        Marland Kitchen  losartan-hydrochlorothiazide (HYZAAR) 50-12.5 MG per tablet Take 1 tablet by mouth daily.        . meloxicam (MOBIC) 15 MG tablet Take 15 mg by mouth daily.        . methocarbamol (ROBAXIN) 500 MG tablet Take by mouth Daily.      . SYMBICORT 160-4.5 MCG/ACT inhaler INHALE TWO PUFFS BY MOUTH TWICE DAILY  1 Inhaler  2  . traMADol (ULTRAM) 50 MG tablet One by mouth every 6-8 hours as needed       . [DISCONTINUED] omeprazole (PRILOSEC) 20 MG capsule Take 20 mg by mouth daily.        Marland Kitchen Respiratory Therapy Supplies (FLUTTER) DEVI Use three times daily  1 each  0  Last reviewed on 05/29/2012  9:51 AM by Storm Frisk, MD   Review of Systems  Constitutional: Positive for fatigue. Negative for diaphoresis, activity change, appetite change and unexpected weight change.  HENT: Positive for voice change. Negative for hearing loss, nosebleeds, congestion, facial  swelling, sneezing, mouth sores, trouble swallowing, neck stiffness, dental problem, sinus pressure, tinnitus and ear discharge.   Eyes: Negative for photophobia, discharge, itching and visual disturbance.  Respiratory: Positive for chest tightness. Negative for apnea, choking and stridor.   Cardiovascular: Negative for palpitations.  Gastrointestinal: Negative for nausea, constipation, blood in stool and abdominal distention.  Genitourinary: Negative for dysuria, urgency, frequency, hematuria, flank pain, decreased urine volume and difficulty urinating.  Musculoskeletal: Positive for back pain and gait problem. Negative for joint swelling and arthralgias.  Skin: Negative for color change and pallor.  Neurological: Positive for numbness. Negative for dizziness, tremors, seizures, syncope, speech difficulty, weakness and light-headedness.  Hematological: Negative for adenopathy. Does not bruise/bleed easily.  Psychiatric/Behavioral: Positive for sleep disturbance. Negative for confusion and agitation. The patient is nervous/anxious.        Objective:   Physical Exam  Filed Vitals:   05/29/12 0940  BP: 140/82  Pulse: 82  Temp: 98.2 F (36.8 C)  TempSrc: Oral  Height: 5\' 9"  (1.753 m)  Weight: 216 lb (97.977 kg)  SpO2: 95%    Gen: Pleasant, well-nourished, in no distress,  normal affect, hard of hearing  ENT: No lesions,  mouth clear,  oropharynx clear, ++ postnasal drip, bilateral nasal purulence R>L   Neck: No JVD, no TMG, no carotid bruits  Lungs: No use of accessory muscles, no dullness to percussion, expired wheezes  Cardiovascular: RRR, heart sounds normal, no murmur or gallops, no peripheral edema  Abdomen: soft and NT, no HSM,  BS normal  Musculoskeletal: No deformities, no cyanosis or clubbing  Neuro: alert, non focal  Skin: Warm, no lesions or rashes   Dg Chest 2 View  05/29/2012  *RADIOLOGY REPORT*  Clinical Data: Cough.  History of pneumonia.  Shortness of  breath.  CHEST - 2 VIEW  Comparison: 06/07/2011  Findings: Biapical pleural/parenchymal densities and nodular densities, stable.  Heart is normal size.  Linear scarring in the right lung base.  No acute opacities or effusions.  No acute bony abnormality.  IMPRESSION: Stable chronic findings.  No acute disease.   Original Report Authenticated By: Charlett Nose, M.D.       Assessment:     Gold B COPD  Gold stage B. COPD with prior history of bronchiolitis obliterans organized pneumonia now with acute on chronic sinusitis and upper airway instability with cyclical cough. No chest x-ray does not show acute process on today's visit 05/29/2012. Reflux disease is also playing a role Plan  Increase omeprazole to one twice daily before meals (see printed Rx for refills) STRICT reflux diet Levaquin 500mg  daily for 7days and Prednisone 10mg  Take 4 for two days three for two days two for two days one for two days   Stay on symbicort Use flutter valve with symbicort twice daily Stay on netipot Stay on nasonex ENT evaluation of sinuses Return 4 months or sooner if unimproved     Updated Medication List Outpatient Encounter Prescriptions as of 05/29/2012  Medication Sig Dispense Refill  . fluticasone (FLONASE) 50 MCG/ACT nasal spray Place 2 sprays into the nose 2 (two) times daily.       Marland Kitchen levothyroxine (SYNTHROID, LEVOTHROID) 125 MCG tablet Take 125 mcg by mouth daily.        Marland Kitchen losartan-hydrochlorothiazide (HYZAAR) 50-12.5 MG per tablet Take 1 tablet by mouth daily.        . meloxicam (MOBIC) 15 MG tablet Take 15 mg by mouth daily.        . methocarbamol (ROBAXIN) 500 MG tablet Take by mouth Daily.      Marland Kitchen omeprazole (PRILOSEC) 20 MG capsule Take 1 capsule (20 mg total) by mouth 2 (two) times daily before a meal.  60 capsule  6  . Sodium Chloride-Sodium Bicarb (CLASSIC NETI POT SINUS WASH NA) Place into the nose. 4-5 times daily      . SYMBICORT 160-4.5 MCG/ACT inhaler INHALE TWO PUFFS BY MOUTH TWICE  DAILY  1 Inhaler  2  . traMADol (ULTRAM) 50 MG tablet One by mouth every 6-8 hours as needed       . [DISCONTINUED] omeprazole (PRILOSEC) 20 MG capsule Take 20 mg by mouth daily.        Marland Kitchen levofloxacin (LEVAQUIN) 500 MG tablet Take 1 tablet (500 mg total) by mouth daily.  7 tablet  0  . predniSONE (DELTASONE) 10 MG tablet Take 4 for two days three for two days two for two days one for two days  20 tablet  0  . Respiratory Therapy Supplies (FLUTTER) DEVI Use three times daily  1 each  0         Plan:

## 2012-05-29 NOTE — Patient Instructions (Addendum)
Increase omeprazole to one twice daily before meals (see printed Rx for refills) STRICT reflux diet Levaquin 500mg  daily for 7days and Prednisone 10mg  Take 4 for two days three for two days two for two days one for two days  : both sent downstairs to pharmacy Chest xray today Stay on symbicort Use flutter valve with symbicort twice daily Stay on netipot Stay on nasonex Make appt with your ENT for re evaluation, tell him about the mesh issue on your L eye and nasal bleeding Return 4 months or sooner if unimproved

## 2012-05-30 ENCOUNTER — Encounter: Payer: Self-pay | Admitting: Internal Medicine

## 2012-06-27 ENCOUNTER — Encounter: Payer: Self-pay | Admitting: Internal Medicine

## 2012-06-27 ENCOUNTER — Ambulatory Visit (INDEPENDENT_AMBULATORY_CARE_PROVIDER_SITE_OTHER): Payer: Medicare Other | Admitting: Internal Medicine

## 2012-06-27 VITALS — BP 124/70 | HR 60 | Ht 69.5 in | Wt 210.8 lb

## 2012-06-27 DIAGNOSIS — K219 Gastro-esophageal reflux disease without esophagitis: Secondary | ICD-10-CM

## 2012-06-27 DIAGNOSIS — K222 Esophageal obstruction: Secondary | ICD-10-CM

## 2012-06-27 DIAGNOSIS — R1310 Dysphagia, unspecified: Secondary | ICD-10-CM

## 2012-06-27 DIAGNOSIS — Z86010 Personal history of colonic polyps: Secondary | ICD-10-CM

## 2012-06-27 MED ORDER — MOVIPREP 100 G PO SOLR: 1.0000 | Freq: Once | ORAL | Status: DC

## 2012-06-27 NOTE — Progress Notes (Signed)
HISTORY OF PRESENT ILLNESS:  Antonio Bowen is a 67 y.o. male with a history of hypertension, non-oxygen-dependent COPD, chronic GERD with peptic stricture, and adenomatous colon polyps. Just today regarding worsening dysphagia and the need for surveillance colonoscopy. He is accompanied by his wife. Review of records shows that the patient last underwent upper endoscopy with esophageal dilation for benign peptic stricture in 2002. He has been on PPI therapy for his GERD. He reports that he has had worsening and significant intermittent solid food dysphagia over the past year. He needs to regurgitate food. There is associated pain. He was having significant breakthrough reflux symptoms until earlier this year, when his pulmonologist increased his PPI to twice a day. This has helped. Dysphagia has persisted. Overall medical health is stable and he remains active after several orthopedic surgeries. He denies any lower GI complaints. He has a history of adenomatous colon polyps. Index colonoscopy 1998. Followup colonoscopy 2002. Most recent colonoscopy 2005 with diminutive polyp and diverticulosis.  REVIEW OF SYSTEMS:  All non-GI ROS negative except for sinus and allergy trouble, arthritis, back pain, cough, fatigue, muscle cramps, sleeping problems, shortness of breath, voice change  Past Medical History  Diagnosis Date  . Bronchitis   . Tobacco dependence   . COPD (chronic obstructive pulmonary disease)   . Hypertension   . GERD (gastroesophageal reflux disease)   . Allergic rhinitis   . Low back pain     Past Surgical History  Procedure Laterality Date  . Other surgical history      facial surgery  . Total hip arthroplasty  01/2011    Right hip, [place]    Social History Antonio Bowen  reports that he quit smoking about 22 months ago. His smoking use included Cigarettes. He has a 50 pack-year smoking history. His smokeless tobacco use includes Snuff and Chew. He reports that he does  not drink alcohol or use illicit drugs.  family history includes Arthritis in his father; Cancer in his maternal grandfather and paternal grandmother; Prostate cancer in his maternal grandfather; and Stomach cancer in his maternal grandmother.  Allergies  Allergen Reactions  . Nuts   . Vicodin (Hydrocodone-Acetaminophen)     tachycardia  . Aspirin     Upset stomach, bleeding  . Penicillins     Rash, hives       PHYSICAL EXAMINATION: Vital signs: BP 124/70  Pulse 60  Ht 5' 9.5" (1.765 m)  Wt 210 lb 12.8 oz (95.618 kg)  BMI 30.69 kg/m2  Constitutional: generally well-appearing, no acute distress Psychiatric: alert and oriented x3, cooperative Eyes: extraocular movements intact, anicteric, conjunctiva pink Mouth: oral pharynx moist, no lesions Neck: supple no lymphadenopathy Cardiovascular: heart regular rate and rhythm, no murmur Lungs: clear to auscultation bilaterally Abdomen: soft, nontender, nondistended, no obvious ascites, no peritoneal signs, normal bowel sounds, no organomegaly Rectal: Deferred until colonoscopy Extremities: no lower extremity edema bilaterally Skin: no lesions on visible extremities Neuro: No focal deficits. No asterixis.    ASSESSMENT:  #1. Worsening intermittent solid food dysphagia likely due to peptic stricture #2. GERD #3. Personal history of adenomatous polyps #4. Multiple general medical problems. Stable. Appropriate for [facility]   PLAN:  #1. Reflux precautions #2. Continue omeprazole 20 mg twice a day #3. Schedule upper endoscopy with esophageal dilation.The nature of the procedure, as well as the risks, benefits, and alternatives were carefully and thoroughly reviewed with the patient. Ample time for discussion and questions allowed. The patient understood, was satisfied, and agreed to  proceed. #4. Surveillance colonoscopy. The nature of the procedure, as well as the risks, benefits, and alternatives were carefully and thoroughly reviewed  with the patient. Ample time for discussion and questions allowed. The patient understood, was satisfied, and agreed to proceed. Movi prep prescribed. The patient instructed on its use

## 2012-06-27 NOTE — Patient Instructions (Addendum)
You have been scheduled for an endoscopy and colonoscopy with propofol. Please follow the written instructions given to you at your visit today. Please pick up your prep at the pharmacy within the next 1-3 days. If you use inhalers (even only as needed) or a CPAP machine, please bring them with you on the day of your procedure. 

## 2012-07-01 ENCOUNTER — Telehealth: Payer: Self-pay | Admitting: Critical Care Medicine

## 2012-07-01 MED ORDER — OMEPRAZOLE 20 MG PO CPDR: 20.0000 mg | DELAYED_RELEASE_CAPSULE | Freq: Two times a day (BID) | ORAL | Status: DC

## 2012-07-01 NOTE — Telephone Encounter (Signed)
Spoke with pt now [facility]s.  Sent rx for prilosec  Nothing further needed.

## 2012-07-09 ENCOUNTER — Ambulatory Visit (AMBULATORY_SURGERY_CENTER): Payer: Medicare Other | Admitting: Internal Medicine

## 2012-07-09 ENCOUNTER — Encounter: Payer: Self-pay | Admitting: Internal Medicine

## 2012-07-09 VITALS — BP 132/84 | HR 60 | Temp 97.3°F | Resp 17 | Ht 69.0 in | Wt 210.0 lb

## 2012-07-09 DIAGNOSIS — K222 Esophageal obstruction: Secondary | ICD-10-CM

## 2012-07-09 DIAGNOSIS — K219 Gastro-esophageal reflux disease without esophagitis: Secondary | ICD-10-CM

## 2012-07-09 DIAGNOSIS — D126 Benign neoplasm of colon, unspecified: Secondary | ICD-10-CM

## 2012-07-09 DIAGNOSIS — R1310 Dysphagia, unspecified: Secondary | ICD-10-CM

## 2012-07-09 DIAGNOSIS — K2210 Ulcer of esophagus without bleeding: Secondary | ICD-10-CM

## 2012-07-09 DIAGNOSIS — Z1211 Encounter for screening for malignant neoplasm of colon: Secondary | ICD-10-CM

## 2012-07-09 DIAGNOSIS — Z86010 Personal history of colonic polyps: Secondary | ICD-10-CM

## 2012-07-09 MED ORDER — SODIUM CHLORIDE 0.9 % IV SOLN: 500.0000 mL | INTRAVENOUS | Status: DC

## 2012-07-09 MED ORDER — OMEPRAZOLE 40 MG PO CPDR: 40.0000 mg | DELAYED_RELEASE_CAPSULE | Freq: Two times a day (BID) | ORAL | Status: DC

## 2012-07-09 NOTE — Progress Notes (Signed)
Patient did not experience any of the following events: a burn prior to discharge; a fall within the facility; wrong site/side/patient/procedure/implant event; or a hospital transfer or hospital admission upon discharge from the facility. (G8907) Patient did not have preoperative order for IV antibiotic SSI prophylaxis. (G8918)  

## 2012-07-09 NOTE — Progress Notes (Signed)
Called to room to assist during endoscopic procedure.  Patient ID and intended procedure confirmed with present staff. Received instructions for my participation in the procedure from the performing physician.  

## 2012-07-09 NOTE — Op Note (Signed)
[facility] [address] [place], [address]   ENDOSCOPY PROCEDURE REPORT  PATIENT: Antonio Bowen, Antonio Bowen  MR#: [id number] BIRTHDATE: 05/29/1945 , 66  yrs. old GENDER: Male ENDOSCOPIST: Roxy Cedar, MD REFERRED BY:  .  Self / Office PROCEDURE DATE:  07/09/2012 PROCEDURE:  EGD w/ biopsy and Maloney dilation of esophagus - 42F ASA CLASS:     Class II INDICATIONS:  Therapeutic procedure.   Dysphagia. MEDICATIONS: MAC sedation, administered by CRNA and propofol (Diprivan) 100mg  IV TOPICAL ANESTHETIC: Cetacaine Spray  DESCRIPTION OF PROCEDURE: After the risks benefits and alternatives of the procedure were thoroughly explained, informed consent was obtained.  The LB GIF-H180 [id number] endoscope was introduced through the mouth and advanced to the second portion of the duodenum. Without limitations.  The instrument was slowly withdrawn as the mucosa was fully examined.      EXAM:The esophagus revealed a distal stricture, 13mm, with inflammation and focal 1cm ulcer (BX) just proximal.  The stomach and duodenum were normal.  THERAPY: 73F MALONEY DILATOR PASSED WITHOUT MILD RESISTANCE BUT NO HEME.  TOLERATED WELL.  Retroflexed views revealed a hiatal hernia. The scope was then withdrawn from the patient and the procedure completed.  COMPLICATIONS: There were no complications. ENDOSCOPIC IMPRESSION: 1. Distal stricture of the esophagus s/p dilation 2. Focal 1cm ulcer esophagus (BX) .  RECOMMENDATIONS: 1.  Clear liquids until 6 pm , then soft foods rest of day.  Resume prior diet tomorrow. 2.  INCREASE OMEPRAZOLE TO 40MG  BID; #60; 11 REFILLS 3. REPEAT  Endoscopy WITH DILATION IN [facility] IN 4 WEEKS  REPEAT EXAM:  eSigned:  Roxy Cedar, MD 07/09/2012 3:39 PM  VH:QIONGE Christell Constant, MD and The Patient

## 2012-07-09 NOTE — Progress Notes (Signed)
Lidocaine-40mg IV prior to Propofol InductionPropofol given over incremental dosages 

## 2012-07-09 NOTE — Patient Instructions (Addendum)
Discharge instructions given with verbal understanding. Handouts on polyps,divertuclosis and a dilatation  Diet given. Resume previous medications. YOU HAD AN ENDOSCOPIC PROCEDURE TODAY AT THE  ENDOSCOPY CENTER: Refer to the procedure report that was given to you for any specific questions about what was found during the examination.  If the procedure report does not answer your questions, please call your gastroenterologist to clarify.  If you requested that your care partner not be given the details of your procedure findings, then the procedure report has been included in a sealed envelope for you to review at your convenience later.  YOU SHOULD EXPECT: Some feelings of bloating in the abdomen. Passage of more gas than usual.  Walking can help get rid of the air that was put into your GI tract during the procedure and reduce the bloating. If you had a lower endoscopy (such as a colonoscopy or flexible sigmoidoscopy) you may notice spotting of blood in your stool or on the toilet paper. If you underwent a bowel prep for your procedure, then you may not have a normal bowel movement for a few days.  DIET: Your first meal following the procedure should be a light meal and then it is ok to progress to your normal diet.  A half-sandwich or bowl of soup is an example of a good first meal.  Heavy or fried foods are harder to digest and may make you feel nauseous or bloated.  Likewise meals heavy in dairy and vegetables can cause extra gas to form and this can also increase the bloating.  Drink plenty of fluids but you should avoid alcoholic beverages for 24 hours.  ACTIVITY: Your care partner should take you home directly after the procedure.  You should plan to take it easy, moving slowly for the rest of the day.  You can resume normal activity the day after the procedure however you should NOT DRIVE or use heavy machinery for 24 hours (because of the sedation medicines used during the test).     SYMPTOMS TO REPORT IMMEDIATELY: A gastroenterologist can be reached at any hour.  During normal business hours, 8:30 AM to 5:00 PM Monday through Friday, call [phone].  After hours and on weekends, please call the GI answering service at [phone] who will take a message and have the physician on call contact you.   Following lower endoscopy (colonoscopy or flexible sigmoidoscopy):  Excessive amounts of blood in the stool  Significant tenderness or worsening of abdominal pains  Swelling of the abdomen that is new, acute  Fever of 100F or higher  Following upper endoscopy (EGD)  Vomiting of blood or coffee ground material  New chest pain or pain under the shoulder blades  Painful or persistently difficult swallowing  New shortness of breath  Fever of 100F or higher  Black, tarry-looking stools  FOLLOW UP: If any biopsies were taken you will be contacted by phone or by letter within the next 1-3 weeks.  Call your gastroenterologist if you have not heard about the biopsies in 3 weeks.  Our staff will call the home number listed on your records the next business day following your procedure to check on you and address any questions or concerns that you may have at that time regarding the information given to you following your procedure. This is a courtesy call and so if there is no answer at the home number and we have not heard from you through the emergency physician on call, we will  assume that you have returned to your regular daily activities without incident.  SIGNATURES/CONFIDENTIALITY: You and/or your care partner have signed paperwork which will be entered into your electronic medical record.  These signatures attest to the fact that that the information above on your After Visit Summary has been reviewed and is understood.  Full responsibility of the confidentiality of this discharge information lies with you and/or your care-partner.

## 2012-07-09 NOTE — Op Note (Signed)
[facility] [address] [place], [address]   COLONOSCOPY PROCEDURE REPORT  PATIENT: Antonio Bowen, Antonio Bowen  MR#: [id number] BIRTHDATE: 08-04-1945 , 66  yrs. old GENDER: Male ENDOSCOPIST: Roxy Cedar, MD REFERRED BY:.  Self / Office PROCEDURE DATE:  07/09/2012 PROCEDURE:   Colonoscopy with snare polypectomy    x 2 ASA CLASS:   Class II INDICATIONS:Patient's personal history of adenomatous colon polyps. Prior [phone] MEDICATIONS: MAC sedation, administered by CRNA and propofol (Diprivan) 200mg  IV  DESCRIPTION OF PROCEDURE:   After the risks benefits and alternatives of the procedure were thoroughly explained, informed consent was obtained.  A digital rectal exam revealed no abnormalities of the rectum.   The LB [id number]-[id number]  endoscope was introduced through the anus and advanced to the cecum, which was identified by both the appendix and ileocecal valve. No adverse events experienced.   The quality of the prep was excellent, using MoviPrep  The instrument was then slowly withdrawn as the colon was fully examined.      COLON FINDINGS: Two diminutive polyps were found at the cecum and in the rectum.  A polypectomy was performed with a cold snare.  The resection was complete and the polyp tissue was completely retrieved.   A 5mm angiodysplastic lesion was found at the cecum. Moderate diverticulosis was noted The finding was in the left colon.   The colon mucosa was otherwise normal.  Retroflexed views revealed internal hemorrhoids. The time to cecum=2 minutes 0 seconds.  Withdrawal time=11 minutes 40 seconds.  The scope was withdrawn and the procedure completed. COMPLICATIONS: There were no complications.  ENDOSCOPIC IMPRESSION: 1.   Two diminutive polyps were found at the cecum and rectum; polypectomy was performed with a cold snare 2.   Incidental Angiodysplastic lesion and at the cecum 3.   Moderate diverticulosis was noted in the left  colon 4.   The colon mucosa was otherwise normal  RECOMMENDATIONS: 1.  Repeat colonoscopy in 5 years if polyp adenomatous; otherwise 10 years 2.  Upper endoscopy today (see report)   eSigned:  Roxy Cedar, MD 07/09/2012 3:29 PM cc: Rudi Heap, MD and The Patient   PATIENT NAME:  Antonio Bowen, Antonio Bowen MR#: [id number]

## 2012-07-10 ENCOUNTER — Telehealth: Payer: Self-pay | Admitting: *Deleted

## 2012-07-10 NOTE — Telephone Encounter (Signed)
  Follow up Call-     Left message on answering machine to call us back if experiencing problems or has questions

## 2012-07-14 ENCOUNTER — Encounter: Payer: Self-pay | Admitting: Internal Medicine

## 2012-07-16 ENCOUNTER — Telehealth: Payer: Self-pay | Admitting: Internal Medicine

## 2012-07-17 NOTE — Telephone Encounter (Signed)
Patient stated he was having trouble getting his rx for Omeprazole; I called [facility] and confirmed that the rx could be filled; I faxed the form that stated the increased dosage was correct, per Dr. Marina Goodell; I told the patient to call them this afternoon to verify that they were going to ship the correct rx.  Patient acknowledged and agreed.

## 2012-08-11 ENCOUNTER — Ambulatory Visit (AMBULATORY_SURGERY_CENTER): Payer: Medicare Other | Admitting: *Deleted

## 2012-08-11 VITALS — Ht 69.0 in | Wt 207.2 lb

## 2012-08-11 DIAGNOSIS — R1310 Dysphagia, unspecified: Secondary | ICD-10-CM

## 2012-08-12 ENCOUNTER — Encounter: Payer: Self-pay | Admitting: Internal Medicine

## 2012-08-25 ENCOUNTER — Ambulatory Visit (AMBULATORY_SURGERY_CENTER): Payer: Medicare Other | Admitting: Internal Medicine

## 2012-08-25 ENCOUNTER — Encounter: Payer: Self-pay | Admitting: Internal Medicine

## 2012-08-25 VITALS — BP 138/74 | HR 61 | Temp 96.9°F | Resp 22 | Ht 69.0 in | Wt 207.0 lb

## 2012-08-25 DIAGNOSIS — R1310 Dysphagia, unspecified: Secondary | ICD-10-CM

## 2012-08-25 DIAGNOSIS — K222 Esophageal obstruction: Secondary | ICD-10-CM

## 2012-08-25 MED ORDER — SODIUM CHLORIDE 0.9 % IV SOLN: 500.0000 mL | INTRAVENOUS | Status: DC

## 2012-08-25 NOTE — Patient Instructions (Addendum)
YOU HAD AN ENDOSCOPIC PROCEDURE TODAY AT THE  ENDOSCOPY CENTER: Refer to the procedure report that was given to you for any specific questions about what was found during the examination.  If the procedure report does not answer your questions, please call your gastroenterologist to clarify.  If you requested that your care partner not be given the details of your procedure findings, then the procedure report has been included in a sealed envelope for you to review at your convenience later.  YOU SHOULD EXPECT: Some feelings of bloating in the abdomen. Passage of more gas than usual.  Walking can help get rid of the air that was put into your GI tract during the procedure and reduce the bloating. If you had a lower endoscopy (such as a colonoscopy or flexible sigmoidoscopy) you may notice spotting of blood in your stool or on the toilet paper. If you underwent a bowel prep for your procedure, then you may not have a normal bowel movement for a few days.  DIET: See Dilation Diet- Drink plenty of fluids but you should avoid alcoholic beverages for 24 hours.  ACTIVITY: Your care partner should take you home directly after the procedure.  You should plan to take it easy, moving slowly for the rest of the day.  You can resume normal activity the day after the procedure however you should NOT DRIVE or use heavy machinery for 24 hours (because of the sedation medicines used during the test).    SYMPTOMS TO REPORT IMMEDIATELY: A gastroenterologist can be reached at any hour.  During normal business hours, 8:30 AM to 5:00 PM Monday through Friday, call [phone].  After hours and on weekends, please call the GI answering service at [phone] who will take a message and have the physician on call contact you.   Following upper endoscopy (EGD)  Vomiting of blood or coffee ground material  New chest pain or pain under the shoulder blades  Painful or persistently difficult swallowing  New  shortness of breath  Fever of 100F or higher  Black, tarry-looking stools  FOLLOW UP: If any biopsies were taken you will be contacted by phone or by letter within the next 1-3 weeks.  Call your gastroenterologist if you have not heard about the biopsies in 3 weeks.  Our staff will call the home number listed on your records the next business day following your procedure to check on you and address any questions or concerns that you may have at that time regarding the information given to you following your procedure. This is a courtesy call and so if there is no answer at the home number and we have not heard from you through the emergency physician on call, we will assume that you have returned to your regular daily activities without incident.  SIGNATURES/CONFIDENTIALITY: You and/or your care partner have signed paperwork which will be entered into your electronic medical record.  These signatures attest to the fact that that the information above on your After Visit Summary has been reviewed and is understood.  Full responsibility of the confidentiality of this discharge information lies with you and/or your care-partner.  Dilation Diet, stricture-handouts given  Continue omeprazole twice a day

## 2012-08-25 NOTE — Progress Notes (Signed)
Patient did not experience any of the following events: a burn prior to discharge; a fall within the facility; wrong site/side/patient/procedure/implant event; or a hospital transfer or hospital admission upon discharge from the facility. (G8907) Patient did not have preoperative order for IV antibiotic SSI prophylaxis. (G8918)  

## 2012-08-25 NOTE — Op Note (Signed)
[facility] [address] [place], [address]   ENDOSCOPY PROCEDURE REPORT  PATIENT: Antonio Bowen, Antonio Bowen  MR#: [id number] BIRTHDATE: [birth year] , 66  yrs. old GENDER: Male ENDOSCOPIST: Roxy Cedar, MD REFERRED BY:  .  [facility] PROCEDURE DATE:  08/25/2012 PROCEDURE:  EGD, diagnostic and Maloney dilation of esophagus - 104F ASA CLASS:     Class II INDICATIONS:  Therapeutic procedure.  Last exam 07-09-12 w/ ulcer. Dilt 68F MEDICATIONS: MAC sedation, administered by CRNA and propofol (Diprivan) 140mg  IV TOPICAL ANESTHETIC: Cetacaine Spray  DESCRIPTION OF PROCEDURE: After the risks benefits and alternatives of the procedure were thoroughly explained, informed consent was obtained.  The LB-GIF Q180 [id number] endoscope was introduced through the mouth and advanced to the second portion of the duodenum. Without limitations.  The instrument was slowly withdrawn as the mucosa was fully examined.      EXAM:Ring-like 15mm stricture at GEJ.  Ulcer esophageal healed. Normal stomach and duodenum.  Retroflexed views revealed no abnormalities.     The scope was then withdrawn from the patient and the procedure completed.  THERAPY: 104F MALONEY DILATOR PASSED WITHOUT RESISTANCE OR HEME  COMPLICATIONS: There were no complications. ENDOSCOPIC IMPRESSION: 1.  Ring-like 15mm stricture at GEJ S/P dilation 2.  Ulcer esophageal healed.  RECOMMENDATIONS: 1.  Clear liquids until 1pm , then soft foods rest of day.  Resume prior diet tomorrow. 2.  Continue PPI therapy (omeprazole 40mg  twice daily)  REPEAT EXAM:  eSigned:  Roxy Cedar, MD 08/25/2012 11:04 AM   [id number] Christell Constant, MD and The Patient

## 2012-09-25 ENCOUNTER — Other Ambulatory Visit: Payer: Self-pay

## 2012-09-25 ENCOUNTER — Ambulatory Visit (INDEPENDENT_AMBULATORY_CARE_PROVIDER_SITE_OTHER): Payer: Medicare Other | Admitting: Critical Care Medicine

## 2012-09-25 ENCOUNTER — Encounter: Payer: Self-pay | Admitting: Critical Care Medicine

## 2012-09-25 VITALS — BP 124/80 | HR 82 | Temp 98.0°F | Ht 69.0 in | Wt 196.0 lb

## 2012-09-25 DIAGNOSIS — J449 Chronic obstructive pulmonary disease, unspecified: Secondary | ICD-10-CM

## 2012-09-25 MED ORDER — BUDESONIDE-FORMOTEROL FUMARATE 160-4.5 MCG/ACT IN AERO: INHALATION_SPRAY | RESPIRATORY_TRACT | Status: DC

## 2012-09-25 MED ORDER — LEVOTHYROXINE SODIUM 125 MCG PO TABS: 125.0000 ug | ORAL_TABLET | Freq: Every day | ORAL | Status: DC

## 2012-09-25 MED ORDER — ALBUTEROL SULFATE HFA 108 (90 BASE) MCG/ACT IN AERS: 2.0000 | INHALATION_SPRAY | Freq: Four times a day (QID) | RESPIRATORY_TRACT | Status: DC | PRN

## 2012-09-25 MED ORDER — TRAMADOL HCL 50 MG PO TABS: 50.0000 mg | ORAL_TABLET | Freq: Two times a day (BID) | ORAL | Status: DC

## 2012-09-25 MED ORDER — MELOXICAM 15 MG PO TABS: 15.0000 mg | ORAL_TABLET | Freq: Every day | ORAL | Status: DC

## 2012-09-25 NOTE — Telephone Encounter (Signed)
rx ready for pick up - Patient NTBS

## 2012-09-25 NOTE — Telephone Encounter (Signed)
No thyroid labs since 2012   Last seen 05/22/12   If approved print and have nurse call patient to pick up for mail order

## 2012-09-25 NOTE — Assessment & Plan Note (Signed)
Gold B copd with improvemetn due to weight loss Plan No change in inhaled or maintenance medications.

## 2012-09-25 NOTE — Progress Notes (Signed)
Subjective:     Patient ID: CRIMSON BEER, male   DOB: Jun 05, 1945, 67 y.o.   MRN: [id number]  HPI  67 y.o.WM Hx Boop, copd Gold B   09/25/2012 Has lost 20# and dyspnea is better.   Pt is working out more. Abd and back machine and inversion table helps Exercises upside down        Past Medical History  Diagnosis Date  . Bronchitis   . Tobacco dependence   . COPD (chronic obstructive pulmonary disease)   . Hypertension   . GERD (gastroesophageal reflux disease)   . Allergic rhinitis   . Low back pain   . Arthritis   . Colon polyps   . Thyroid disease   . Kidney stone   . BOOP (bronchiolitis obliterans with organizing pneumonia)      Family History  Problem Relation Age of Onset  . Arthritis Father   . Cancer Maternal Grandfather   . Prostate cancer Maternal Grandfather   . Stomach cancer Maternal Grandmother   . Cancer Paternal Grandmother   . Colon cancer      mat 1st cousin     History   Social History  . Marital Status: Married    Spouse Name: N/A    Number of Children: 2  . Years of Education: N/A   Occupational History  . 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 Smoking status: Former Smoker -- 1.00 packs/day for 50 years    Types: Cigarettes    Quit date: 07/30/2010  . Smokeless tobacco: Current User    Types: Snuff     Comment: tobacco info given 06-27-12  . Alcohol Use: No  . Drug Use: No  . Sexually Active: Not on file   Other Topics Concern  . Not on file   Social History Narrative  . No narrative on file     Allergies  Allergen Reactions  . Nuts Nausea And Vomiting    sweating  . Vicodin (Hydrocodone-Acetaminophen)     tachycardia  . Aspirin     Upset stomach, bleeding  . Other     Per patient, cannot take any narcotic. Nausea and vomiting  . Penicillins     Rash, hives     Outpatient Prescriptions Prior to Visit  Medication Sig Dispense Refill  .  cetirizine (ZYRTEC) 10 MG tablet Take 10 mg by mouth daily.      . fluticasone (FLONASE) 50 MCG/ACT nasal spray Place 2 sprays into the nose 2 (two) times daily.       Marland Kitchen levothyroxine (SYNTHROID, LEVOTHROID) 125 MCG tablet Take 125 mcg by mouth daily.        Marland Kitchen losartan-hydrochlorothiazide (HYZAAR) 50-12.5 MG per tablet Take 1 tablet by mouth daily.        . meloxicam (MOBIC) 15 MG tablet Take 15 mg by mouth daily.        Marland Kitchen omeprazole (PRILOSEC) 40 MG capsule Take 1 capsule (40 mg total) by mouth 2 (two) times daily.  60 capsule  11  . OVER THE COUNTER MEDICATION OTC leg cramp medicine      . PRESCRIPTION MEDICATION Oxygen 2.0 liters at night, prn use in day      . Sodium Chloride-Sodium Bicarb (CLASSIC NETI POT SINUS WASH NA) Place into the nose. 2-3 times daily      . traMADol (ULTRAM) 50 MG tablet One by mouth every  6-8 hours as needed       . SYMBICORT 160-4.5 MCG/ACT inhaler INHALE TWO PUFFS BY MOUTH TWICE DAILY  1 Inhaler  2  . Respiratory Therapy Supplies (FLUTTER) DEVI Use three times daily  1 each  0   No facility-administered medications prior to visit.     Review of Systems  Constitutional: Positive for fatigue. Negative for diaphoresis, activity change, appetite change and unexpected weight change.  HENT: Positive for voice change. Negative for hearing loss, nosebleeds, congestion, facial swelling, sneezing, mouth sores, trouble swallowing, neck stiffness, dental problem, sinus pressure, tinnitus and ear discharge.   Eyes: Negative for photophobia, discharge, itching and visual disturbance.  Respiratory: Positive for chest tightness. Negative for apnea, choking and stridor.   Cardiovascular: Negative for palpitations.  Gastrointestinal: Negative for nausea, constipation, blood in stool and abdominal distention.  Genitourinary: Negative for dysuria, urgency, frequency, hematuria, flank pain, decreased urine volume and difficulty urinating.  Musculoskeletal: Positive for back pain  and gait problem. Negative for joint swelling and arthralgias.  Skin: Negative for color change and pallor.  Neurological: Positive for numbness. Negative for dizziness, tremors, seizures, syncope, speech difficulty, weakness and light-headedness.  Hematological: Negative for adenopathy. Does not bruise/bleed easily.  Psychiatric/Behavioral: Positive for sleep disturbance. Negative for confusion and agitation. The patient is nervous/anxious.        Objective:   Physical Exam  Filed Vitals:   09/25/12 1005  BP: 124/80  Pulse: 82  Temp: 98 F (36.7 C)  TempSrc: Oral  Height: 5\' 9"  (1.753 m)  Weight: 196 lb (88.905 kg)  SpO2: 96%    Gen: Pleasant, well-nourished, in no distress,  normal affect, hard of hearing  ENT: No lesions,  mouth clear,  oropharynx clear, ++ postnasal drip, bilateral nasal purulence R>L   Neck: No JVD, no TMG, no carotid bruits  Lungs: No use of accessory muscles, no dullness to percussion, expired wheezes  Cardiovascular: RRR, heart sounds normal, no murmur or gallops, no peripheral edema  Abdomen: soft and NT, no HSM,  BS normal  Musculoskeletal: No deformities, no cyanosis or clubbing  Neuro: alert, non focal  Skin: Warm, no lesions or rashes   No results found.    Assessment:     Gold B COPD  Gold B copd with improvemetn due to weight loss Plan No change in inhaled or maintenance medications.      Updated Medication List Outpatient Encounter Prescriptions as of 09/25/2012  Medication Sig Dispense Refill  . budesonide-formoterol (SYMBICORT) 160-4.5 MCG/ACT inhaler INHALE TWO PUFFS BY MOUTH TWICE DAILY  1 Inhaler  11  . cetirizine (ZYRTEC) 10 MG tablet Take 10 mg by mouth daily.      . fluticasone (FLONASE) 50 MCG/ACT nasal spray Place 2 sprays into the nose 2 (two) times daily.       Marland Kitchen levothyroxine (SYNTHROID, LEVOTHROID) 125 MCG tablet Take 125 mcg by mouth daily.        Marland Kitchen losartan-hydrochlorothiazide (HYZAAR) 50-12.5 MG per tablet  Take 1 tablet by mouth daily.        . meloxicam (MOBIC) 15 MG tablet Take 15 mg by mouth daily.        Marland Kitchen omeprazole (PRILOSEC) 40 MG capsule Take 1 capsule (40 mg total) by mouth 2 (two) times daily.  60 capsule  11  . OVER THE COUNTER MEDICATION OTC leg cramp medicine      . PRESCRIPTION MEDICATION Oxygen 2.0 liters at night, prn use in day      .  Sodium Chloride-Sodium Bicarb (CLASSIC NETI POT SINUS WASH NA) Place into the nose. 2-3 times daily      . traMADol (ULTRAM) 50 MG tablet One by mouth every 6-8 hours as needed       . [DISCONTINUED] SYMBICORT 160-4.5 MCG/ACT inhaler INHALE TWO PUFFS BY MOUTH TWICE DAILY  1 Inhaler  2  . albuterol (PROVENTIL HFA;VENTOLIN HFA) 108 (90 BASE) MCG/ACT inhaler Inhale 2 puffs into the lungs every 6 (six) hours as needed for wheezing.  1 Inhaler  0  . Respiratory Therapy Supplies (FLUTTER) DEVI Use three times daily  1 each  0   No facility-administered encounter medications on file as of 09/25/2012.         Plan:

## 2012-09-25 NOTE — Patient Instructions (Addendum)
No change in medications. Return in        6 months        

## 2012-09-26 NOTE — Telephone Encounter (Signed)
Pt aware.

## 2012-12-22 ENCOUNTER — Other Ambulatory Visit: Payer: Self-pay | Admitting: *Deleted

## 2012-12-22 NOTE — Telephone Encounter (Signed)
ntbs for refill especially a 3 month supply

## 2012-12-22 NOTE — Telephone Encounter (Signed)
Patient last seen in office on 05-22-12. Requesting 3 month supply to send to [facility]. If approved please print and route to Pool B so nurse can call pt to pick up

## 2013-01-26 ENCOUNTER — Telehealth: Payer: Self-pay | Admitting: Critical Care Medicine

## 2013-01-26 MED ORDER — BUDESONIDE-FORMOTEROL FUMARATE 160-4.5 MCG/ACT IN AERO: INHALATION_SPRAY | RESPIRATORY_TRACT | Status: DC

## 2013-01-26 MED ORDER — ALBUTEROL SULFATE HFA 108 (90 BASE) MCG/ACT IN AERS: 2.0000 | INHALATION_SPRAY | Freq: Four times a day (QID) | RESPIRATORY_TRACT | Status: DC | PRN

## 2013-01-26 NOTE — Telephone Encounter (Signed)
I spoke with pt. RX's have been sent in and appt scheduled. Nothing further needed

## 2013-03-09 ENCOUNTER — Ambulatory Visit (INDEPENDENT_AMBULATORY_CARE_PROVIDER_SITE_OTHER): Payer: Medicare Other | Admitting: Critical Care Medicine

## 2013-03-09 ENCOUNTER — Encounter: Payer: Self-pay | Admitting: Critical Care Medicine

## 2013-03-09 VITALS — BP 132/90 | HR 75 | Temp 98.0°F | Ht 69.0 in | Wt 207.0 lb

## 2013-03-09 DIAGNOSIS — J019 Acute sinusitis, unspecified: Secondary | ICD-10-CM

## 2013-03-09 DIAGNOSIS — Z23 Encounter for immunization: Secondary | ICD-10-CM

## 2013-03-09 DIAGNOSIS — J441 Chronic obstructive pulmonary disease with (acute) exacerbation: Secondary | ICD-10-CM

## 2013-03-09 MED ORDER — PREDNISONE 10 MG PO TABS: ORAL_TABLET | ORAL | Status: DC

## 2013-03-09 MED ORDER — CEFUROXIME AXETIL 500 MG PO TABS: 500.0000 mg | ORAL_TABLET | Freq: Two times a day (BID) | ORAL | Status: DC

## 2013-03-09 NOTE — Assessment & Plan Note (Addendum)
Gold stage B. COPD with associated tracheobronchitis flare Associated acute sinusitis Plan Take ceftin (cefuroxime) 500mg  twice daily for 10days Take prednisone (use supply from home, see prescription if you run out too soon)  10mg  Take 4 for two days three for two days two for two days one for two days  No other medication changes Flu vaccine administered Return 4 months

## 2013-03-09 NOTE — Progress Notes (Signed)
Subjective:     Patient ID: Antonio Bowen, male   DOB: [birth year], 67 y.o.   MRN: [id number]  HPI  67 y.o.WM Hx Boop, copd Gold B   03/09/2013 Chief Complaint  Patient presents with  . Follow-up    Pt c/o increased wheezing, losing voice as day progresses.    The patient now notes more wheezing and loss of voice as the day progresses. There is increasing nasal congestion and sinus pressure. There is increased cough noted. The patient now maintains oxygen 2 L at bedtime.   Past Medical History  Diagnosis Date  . Bronchitis   . Tobacco dependence   . COPD (chronic obstructive pulmonary disease)   . Hypertension   . GERD (gastroesophageal reflux disease)   . Allergic rhinitis   . Low back pain   . Arthritis   . Colon polyps   . Thyroid disease   . Kidney stone   . BOOP (bronchiolitis obliterans with organizing pneumonia)      Family History  Problem Relation Age of Onset  . Arthritis Father   . Cancer Maternal Grandfather   . Prostate cancer Maternal Grandfather   . Stomach cancer Maternal Grandmother   . Cancer Paternal Grandmother   . Colon cancer      mat 1st cousin     History   Social History  . Marital Status: Married    Spouse Name: N/A    Number of Children: 2  . Years of Education: N/A   Occupational History  . 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 Smoking status: Former Smoker -- 1.00 packs/day for 50 years    Types: Cigarettes    Quit date: 07/30/2010  . Smokeless tobacco: Current User    Types: Snuff     Comment: tobacco info given 06-27-12  . Alcohol Use: No  . Drug Use: No  . Sexual Activity: Not on file   Other Topics Concern  . Not on file   Social History Narrative  . No narrative on file     Allergies  Allergen Reactions  . Nuts Nausea And Vomiting    sweating  . Vicodin [Hydrocodone-Acetaminophen]     tachycardia  . Aspirin     Upset stomach, bleeding  .  Other     Per patient, cannot take any narcotic. Nausea and vomiting  . Penicillins     Rash, hives     Outpatient Prescriptions Prior to Visit  Medication Sig Dispense Refill  . albuterol (PROVENTIL HFA;VENTOLIN HFA) 108 (90 BASE) MCG/ACT inhaler Inhale 2 puffs into the lungs every 6 (six) hours as needed for wheezing.  1 Inhaler  3  . budesonide-formoterol (SYMBICORT) 160-4.5 MCG/ACT inhaler INHALE TWO PUFFS BY MOUTH TWICE DAILY  1 Inhaler  3  . cetirizine (ZYRTEC) 10 MG tablet Take 10 mg by mouth daily.      . fluticasone (FLONASE) 50 MCG/ACT nasal spray Place 2 sprays into the nose 2 (two) times daily.       Marland Kitchen levothyroxine (SYNTHROID, LEVOTHROID) 125 MCG tablet Take 1 tablet (125 mcg total) by mouth daily.  90 tablet  0  . losartan-hydrochlorothiazide (HYZAAR) 50-12.5 MG per tablet Take 1 tablet by mouth daily.        . meloxicam (MOBIC) 15 MG tablet Take 1 tablet (15 mg total) by mouth daily.  90 tablet  0  . omeprazole (PRILOSEC) 40  MG capsule Take 1 capsule (40 mg total) by mouth 2 (two) times daily.  60 capsule  11  . OVER THE COUNTER MEDICATION OTC leg cramp medicine      . PRESCRIPTION MEDICATION Oxygen 2.0 liters at night, prn use in day      . Sodium Chloride-Sodium Bicarb (CLASSIC NETI POT SINUS WASH NA) Place into the nose. 2-3 times daily      . traMADol (ULTRAM) 50 MG tablet Take 1 tablet (50 mg total) by mouth 2 (two) times daily. One by mouth every 6-8 hours as needed  180 tablet  0  . Respiratory Therapy Supplies (FLUTTER) DEVI Use three times daily  1 each  0   No facility-administered medications prior to visit.     Review of Systems  Constitutional: Positive for fatigue. Negative for diaphoresis, activity change, appetite change and unexpected weight change.  HENT: Positive for voice change. Negative for congestion, dental problem, ear discharge, facial swelling, hearing loss, mouth sores, nosebleeds, sinus pressure, sneezing, tinnitus and trouble swallowing.    Eyes: Negative for photophobia, discharge, itching and visual disturbance.  Respiratory: Positive for chest tightness. Negative for apnea, choking and stridor.   Cardiovascular: Negative for palpitations.  Gastrointestinal: Negative for nausea, constipation, blood in stool and abdominal distention.  Genitourinary: Negative for dysuria, urgency, frequency, hematuria, flank pain, decreased urine volume and difficulty urinating.  Musculoskeletal: Positive for back pain and gait problem. Negative for arthralgias, joint swelling and neck stiffness.  Skin: Negative for color change and pallor.  Neurological: Positive for numbness. Negative for dizziness, tremors, seizures, syncope, speech difficulty, weakness and light-headedness.  Hematological: Negative for adenopathy. Does not bruise/bleed easily.  Psychiatric/Behavioral: Positive for sleep disturbance. Negative for confusion and agitation. The patient is nervous/anxious.        Objective:   Physical Exam  Filed Vitals:   03/09/13 1040  BP: 132/90  Pulse: 75  Temp: 98 F (36.7 C)  TempSrc: Oral  Height: 5\' 9"  (1.753 m)  Weight: 207 lb (93.895 kg)  SpO2: 96%    Gen: Pleasant, well-nourished, in no distress,  normal affect, hard of hearing  ENT: No lesions,  mouth clear,  oropharynx clear, ++ postnasal drip, bilateral nasal purulence R>L   Neck: No JVD, no TMG, no carotid bruits  Lungs: No use of accessory muscles, no dullness to percussion, expired wheezes  Cardiovascular: RRR, heart sounds normal, no murmur or gallops, no peripheral edema  Abdomen: soft and NT, no HSM,  BS normal  Musculoskeletal: No deformities, no cyanosis or clubbing  Neuro: alert, non focal  Skin: Warm, no lesions or rashes   No results found.    Assessment:     Obstructive chronic bronchitis with exacerbation Gold B Copd Gold stage B. COPD with associated tracheobronchitis flare Associated acute sinusitis Plan Take ceftin (cefuroxime) 500mg   twice daily for 10days Take prednisone (use supply from home, see prescription if you run out too soon)  10mg  Take 4 for two days three for two days two for two days one for two days  No other medication changes Flu vaccine administered Return 4 months    increase oxygen to 3 L each bedtime  Updated Medication List Outpatient Encounter Prescriptions as of 03/09/2013  Medication Sig  . acetaminophen (TYLENOL) 500 MG tablet Take 500 mg by mouth every 6 (six) hours as needed.  Marland Kitchen albuterol (PROVENTIL HFA;VENTOLIN HFA) 108 (90 BASE) MCG/ACT inhaler Inhale 2 puffs into the lungs every 6 (six) hours as needed for wheezing.  Marland Kitchen  budesonide-formoterol (SYMBICORT) 160-4.5 MCG/ACT inhaler INHALE TWO PUFFS BY MOUTH TWICE DAILY  . cetirizine (ZYRTEC) 10 MG tablet Take 10 mg by mouth daily.  . fluticasone (FLONASE) 50 MCG/ACT nasal spray Place 2 sprays into the nose 2 (two) times daily.   Marland Kitchen levothyroxine (SYNTHROID, LEVOTHROID) 125 MCG tablet Take 1 tablet (125 mcg total) by mouth daily.  Marland Kitchen losartan-hydrochlorothiazide (HYZAAR) 50-12.5 MG per tablet Take 1 tablet by mouth daily.    . meloxicam (MOBIC) 15 MG tablet Take 1 tablet (15 mg total) by mouth daily.  Marland Kitchen omeprazole (PRILOSEC) 40 MG capsule Take 1 capsule (40 mg total) by mouth 2 (two) times daily.  Marland Kitchen OVER THE COUNTER MEDICATION OTC leg cramp medicine  . PRESCRIPTION MEDICATION Oxygen 2.0 liters at night, prn use in day  . Sodium Chloride-Sodium Bicarb (CLASSIC NETI POT SINUS WASH NA) Place into the nose. 2-3 times daily  . traMADol (ULTRAM) 50 MG tablet Take 1 tablet (50 mg total) by mouth 2 (two) times daily. One by mouth every 6-8 hours as needed  . cefUROXime (CEFTIN) 500 MG tablet Take 1 tablet (500 mg total) by mouth 2 (two) times daily.  . predniSONE (DELTASONE) 10 MG tablet Take 4 for two days three for two days two for two days one for two days  . Respiratory Therapy Supplies (FLUTTER) DEVI Use three times daily         Plan:

## 2013-03-09 NOTE — Patient Instructions (Signed)
Take ceftin (cefuroxime) 500mg  twice daily for 10days Take prednisone (use supply from home, see prescription if you run out too soon)  10mg  Take 4 for two days three for two days two for two days one for two days  No other medication changes Flu vaccine administered Return 4 months

## 2013-04-08 ENCOUNTER — Other Ambulatory Visit (HOSPITAL_COMMUNITY): Payer: Self-pay | Admitting: Orthopedic Surgery

## 2013-04-08 DIAGNOSIS — M25551 Pain in right hip: Secondary | ICD-10-CM

## 2013-04-28 ENCOUNTER — Other Ambulatory Visit (HOSPITAL_COMMUNITY): Payer: Medicare Other

## 2013-04-28 ENCOUNTER — Encounter (HOSPITAL_COMMUNITY)
Admission: RE | Admit: 2013-04-28 | Discharge: 2013-04-28 | Disposition: A | Discharge status: Home / Self Care | Payer: Medicare Other | Source: Ambulatory Visit | Attending: Orthopedic Surgery | Admitting: Orthopedic Surgery

## 2013-04-28 DIAGNOSIS — Z96649 Presence of unspecified artificial hip joint: Secondary | ICD-10-CM | POA: Insufficient documentation

## 2013-04-28 DIAGNOSIS — Z9181 History of falling: Secondary | ICD-10-CM | POA: Insufficient documentation

## 2013-04-28 DIAGNOSIS — M25559 Pain in unspecified hip: Secondary | ICD-10-CM | POA: Insufficient documentation

## 2013-04-28 DIAGNOSIS — M25551 Pain in right hip: Secondary | ICD-10-CM

## 2013-04-28 MED ORDER — TECHNETIUM TC 99M MEDRONATE IV KIT
25.0000 | PACK | Freq: Once | INTRAVENOUS | Status: AC | PRN
Start: 2013-04-28 — End: 2013-04-28
  Administered 2013-04-28: 25 via INTRAVENOUS

## 2013-04-29 ENCOUNTER — Encounter (HOSPITAL_COMMUNITY): Payer: Medicare Other

## 2013-05-05 ENCOUNTER — Telehealth: Payer: Self-pay

## 2013-05-05 DIAGNOSIS — R1310 Dysphagia, unspecified: Secondary | ICD-10-CM

## 2013-05-05 DIAGNOSIS — K219 Gastro-esophageal reflux disease without esophagitis: Secondary | ICD-10-CM

## 2013-05-05 DIAGNOSIS — K2210 Ulcer of esophagus without bleeding: Secondary | ICD-10-CM

## 2013-05-05 DIAGNOSIS — K222 Esophageal obstruction: Secondary | ICD-10-CM

## 2013-05-05 MED ORDER — OMEPRAZOLE 40 MG PO CPDR: 40.0000 mg | DELAYED_RELEASE_CAPSULE | Freq: Two times a day (BID) | ORAL | Status: DC

## 2013-05-05 NOTE — Telephone Encounter (Signed)
Omeprazole refilled.

## 2013-05-05 NOTE — Telephone Encounter (Signed)
Sent 90 day rx for Omeprazole to [facility]

## 2013-05-06 ENCOUNTER — Ambulatory Visit (INDEPENDENT_AMBULATORY_CARE_PROVIDER_SITE_OTHER): Payer: Medicare HMO | Admitting: Family Medicine

## 2013-05-06 ENCOUNTER — Ambulatory Visit: Payer: Medicare HMO

## 2013-05-06 ENCOUNTER — Encounter (INDEPENDENT_AMBULATORY_CARE_PROVIDER_SITE_OTHER): Payer: Self-pay

## 2013-05-06 ENCOUNTER — Encounter: Payer: Self-pay | Admitting: Family Medicine

## 2013-05-06 ENCOUNTER — Other Ambulatory Visit: Payer: Self-pay | Admitting: *Deleted

## 2013-05-06 VITALS — BP 134/88 | HR 64 | Temp 97.9°F | Ht 69.0 in | Wt 212.0 lb

## 2013-05-06 DIAGNOSIS — K222 Esophageal obstruction: Secondary | ICD-10-CM

## 2013-05-06 DIAGNOSIS — R1310 Dysphagia, unspecified: Secondary | ICD-10-CM

## 2013-05-06 DIAGNOSIS — N529 Male erectile dysfunction, unspecified: Secondary | ICD-10-CM

## 2013-05-06 DIAGNOSIS — M25579 Pain in unspecified ankle and joints of unspecified foot: Secondary | ICD-10-CM

## 2013-05-06 DIAGNOSIS — K2210 Ulcer of esophagus without bleeding: Secondary | ICD-10-CM

## 2013-05-06 DIAGNOSIS — E039 Hypothyroidism, unspecified: Secondary | ICD-10-CM

## 2013-05-06 DIAGNOSIS — K219 Gastro-esophageal reflux disease without esophagitis: Secondary | ICD-10-CM

## 2013-05-06 DIAGNOSIS — M79673 Pain in unspecified foot: Secondary | ICD-10-CM

## 2013-05-06 DIAGNOSIS — G8929 Other chronic pain: Secondary | ICD-10-CM

## 2013-05-06 DIAGNOSIS — Z136 Encounter for screening for cardiovascular disorders: Secondary | ICD-10-CM

## 2013-05-06 DIAGNOSIS — I10 Essential (primary) hypertension: Secondary | ICD-10-CM

## 2013-05-06 LAB — POCT GLYCOSYLATED HEMOGLOBIN (HGB A1C): Hemoglobin A1C: 5.2

## 2013-05-06 MED ORDER — TRAMADOL HCL 50 MG PO TABS: 50.0000 mg | ORAL_TABLET | Freq: Two times a day (BID) | ORAL | Status: DC

## 2013-05-06 MED ORDER — MELOXICAM 15 MG PO TABS: 15.0000 mg | ORAL_TABLET | Freq: Every day | ORAL | Status: DC

## 2013-05-06 MED ORDER — FLUTICASONE PROPIONATE 50 MCG/ACT NA SUSP: 2.0000 | Freq: Two times a day (BID) | NASAL | Status: DC

## 2013-05-06 MED ORDER — LEVOTHYROXINE SODIUM 125 MCG PO TABS: 125.0000 ug | ORAL_TABLET | Freq: Every day | ORAL | Status: DC

## 2013-05-06 MED ORDER — LOSARTAN POTASSIUM-HCTZ 50-12.5 MG PO TABS: 1.0000 | ORAL_TABLET | Freq: Every day | ORAL | Status: DC

## 2013-05-06 MED ORDER — OMEPRAZOLE 40 MG PO CPDR: 40.0000 mg | DELAYED_RELEASE_CAPSULE | Freq: Two times a day (BID) | ORAL | Status: DC

## 2013-05-06 NOTE — Progress Notes (Signed)
   Subjective:    Patient ID: Antonio Bowen, male    DOB: [birth year], 68 y.o.   MRN: [id number]  HPI Pt presents today for general follow up  Baseline hx/o non oxygen dependent COPD and BOOP.  Followed by Jorge Ny. Stable on symbicort and prn albuterol.  Also with GERD. Recently had esophageal dilation. Dysphagia has improved.  Major issues today:  L heel pain: Chronic issue. Has had heel lift used in the past. Has had flare of pain over last week. Pain worse in am and mildly improves throughout the day.   Patient Active Problem List   Diagnosis Date Noted  . BOOP (bronchiolitis obliterans with organizing pneumonia) 09/29/2010  . Hearing loss, conductive, bilateral 09/21/2010  . Hypothyroidism 09/21/2010  . Obstructive chronic bronchitis with exacerbation Gold B Copd   . Hypertension   . GERD (gastroesophageal reflux disease)   . Low back pain    Current Outpatient Prescriptions on File Prior to Visit  Medication Sig Dispense Refill  . acetaminophen (TYLENOL) 500 MG tablet Take 500 mg by mouth every 6 (six) hours as needed.      Marland Kitchen albuterol (PROVENTIL HFA;VENTOLIN HFA) 108 (90 BASE) MCG/ACT inhaler Inhale 2 puffs into the lungs every 6 (six) hours as needed for wheezing.  1 Inhaler  3  . budesonide-formoterol (SYMBICORT) 160-4.5 MCG/ACT inhaler INHALE TWO PUFFS BY MOUTH TWICE DAILY  1 Inhaler  3  . cetirizine (ZYRTEC) 10 MG tablet Take 10 mg by mouth daily.      Marland Kitchen OVER THE COUNTER MEDICATION OTC leg cramp medicine      . PRESCRIPTION MEDICATION Oxygen 2.0 liters at night, prn use in day      . Respiratory Therapy Supplies (FLUTTER) DEVI Use three times daily  1 each  0   No current facility-administered medications on file prior to visit.      Review of Systems  All other systems reviewed and are negative.       Objective:   Physical Exam  Constitutional: He appears well-developed and well-nourished.  HENT:  Head: Normocephalic and atraumatic.  Eyes: Conjunctivae  are normal. Pupils are equal, round, and reactive to light.  Neck: Normal range of motion.  Cardiovascular: Normal rate and regular rhythm.   Pulmonary/Chest: Effort normal and breath sounds normal.  Abdominal: Soft.  Musculoskeletal: Normal range of motion.  Neurological: He is alert.  Skin: Skin is warm.          Assessment & Plan:  Esophageal reflux - Plan: omeprazole (PRILOSEC) 40 MG capsule  Dysphagia, unspecified(787.20) - Plan: omeprazole (PRILOSEC) 40 MG capsule  Stricture and stenosis of esophagus - Plan: omeprazole (PRILOSEC) 40 MG capsule  Esophageal ulcer - Plan: omeprazole (PRILOSEC) 40 MG capsule  Screening for cardiovascular condition - Plan: Comprehensive metabolic panel, CBC With differential/Platelet, POCT A1C, NMR, lipoprofile  HTN (hypertension) - Plan: losartan-hydrochlorothiazide (HYZAAR) 50-12.5 MG per tablet, Comprehensive metabolic panel  Unspecified hypothyroidism - Plan: levothyroxine (SYNTHROID, LEVOTHROID) 125 MCG tablet, TSH  Erectile dysfunction - Plan: Testosterone, free, total  Heel pain, chronic - Plan: CANCELED: DG Foot Complete Left  Plan as above.  Pt refused imaging today.  Will check labs if pt agreeable (pt unhappy about narcotic request not being filled today).  Discussed that he will need a formal visit for chronic narcotic follow up.  Follow up as needed.

## 2013-05-09 LAB — NMR, LIPOPROFILE
Cholesterol: 104 mg/dL (ref <200)
HDL Cholesterol by NMR: 54 mg/dL (ref >=40)
HDL Particle Number: 34.8 umol/L (ref >=30.5)
LDL Particle Number: <300 nmol/L (ref <1000)
LDL Size: 21.5 nm (ref >20.5)
LDLC SERPL CALC-MCNC: 34 mg/dL (ref <100)
LP-IR Score: 36 (ref <=45)
Small LDL Particle Number: <90 nmol/L (ref <=527)
Triglycerides by NMR: 79 mg/dL (ref <150)

## 2013-05-09 LAB — COMPREHENSIVE METABOLIC PANEL
ALT: 23 IU/L (ref 0–44)
AST: 18 IU/L (ref 0–40)
Albumin/Globulin Ratio: 2.2 (ref 1.1–2.5)
Albumin: 4.6 g/dL (ref 3.6–4.8)
Alkaline Phosphatase: 74 IU/L (ref 39–117)
BUN/Creatinine Ratio: 8 — ABNORMAL LOW (ref 10–22)
BUN: 12 mg/dL (ref 8–27)
CO2: 24 mmol/L (ref 18–29)
Calcium: 9.8 mg/dL (ref 8.6–10.2)
Chloride: 100 mmol/L (ref 97–108)
Creatinine, Ser: 1.42 mg/dL — ABNORMAL HIGH (ref 0.76–1.27)
GFR calc Af Amer: 59 mL/min/{1.73_m2} — ABNORMAL LOW (ref >59)
GFR calc non Af Amer: 51 mL/min/{1.73_m2} — ABNORMAL LOW (ref >59)
Globulin, Total: 2.1 g/dL (ref 1.5–4.5)
Glucose: 97 mg/dL (ref 65–99)
Potassium: 4.1 mmol/L (ref 3.5–5.2)
Sodium: 142 mmol/L (ref 134–144)
Total Bilirubin: 0.6 mg/dL (ref 0.0–1.2)
Total Protein: 6.7 g/dL (ref 6.0–8.5)

## 2013-05-09 LAB — CBC WITH DIFFERENTIAL
Basophils Absolute: 0.1 10*3/uL (ref 0.0–0.2)
Basos: 1 %
Eos: 2 %
Eosinophils Absolute: 0.2 10*3/uL (ref 0.0–0.4)
HCT: 49.9 % (ref 37.5–51.0)
Hemoglobin: 17.7 g/dL (ref 12.6–17.7)
Immature Grans (Abs): 0.2 10*3/uL — ABNORMAL HIGH (ref 0.0–0.1)
Immature Granulocytes: 1 %
Lymphocytes Absolute: 5.3 10*3/uL — ABNORMAL HIGH (ref 0.7–3.1)
Lymphs: 46 %
MCH: 31.9 pg (ref 26.6–33.0)
MCHC: 35.5 g/dL (ref 31.5–35.7)
MCV: 90 fL (ref 79–97)
Monocytes Absolute: 0.6 10*3/uL (ref 0.1–0.9)
Monocytes: 5 %
Neutrophils Absolute: 5.2 10*3/uL (ref 1.4–7.0)
Neutrophils Relative %: 45 %
Platelets: 182 10*3/uL (ref 150–379)
RBC: 5.55 x10E6/uL (ref 4.14–5.80)
RDW: 13.4 % (ref 12.3–15.4)
WBC: 11.5 10*3/uL — ABNORMAL HIGH (ref 3.4–10.8)

## 2013-05-09 LAB — TESTOSTERONE, FREE, TOTAL, SHBG
Testosterone, Free: 7.4 pg/mL (ref 6.6–18.1)
Testosterone, total: 733.1 ng/dL (ref 348.0–1197.0)

## 2013-05-09 LAB — TSH: TSH: 2.5 u[IU]/mL (ref 0.450–4.500)

## 2013-05-27 ENCOUNTER — Telehealth: Payer: Self-pay | Admitting: Internal Medicine

## 2013-05-27 NOTE — Telephone Encounter (Signed)
Obtained [employer] prior authorization phone number from patient and called to check on status of Omeprazole.  I was advised it was under review and would hear the decision within 72 hours.  I called pt back to let him know I will call him as soon as I get the response.  Patient agreed.

## 2013-06-11 NOTE — Telephone Encounter (Signed)
Called [employer] to check on prior authorization of Omeprazole - was told it was approved.  Called patient and LM on VM letting him know.

## 2013-08-04 ENCOUNTER — Telehealth: Payer: Self-pay | Admitting: Internal Medicine

## 2013-08-05 NOTE — Telephone Encounter (Signed)
I have spoken to [place] at [facility]. Advised that an Rx was sent for omeprazole #180 with 1 refills was sent 05/06/13. Therefore, patient should still have an additional 3 month supply. Antonio Bowen states that rx was filled on 05/25/13 and 05/2013. I advised that patient should not be taking more than 2 tablets of omeprazole daily and if he feels that this is not working, he needs to contact our office. Antonio Bowen verbalizes understanding.

## 2013-08-06 ENCOUNTER — Ambulatory Visit (INDEPENDENT_AMBULATORY_CARE_PROVIDER_SITE_OTHER): Payer: Commercial Managed Care - HMO | Admitting: Critical Care Medicine

## 2013-08-06 ENCOUNTER — Encounter: Payer: Self-pay | Admitting: Critical Care Medicine

## 2013-08-06 ENCOUNTER — Other Ambulatory Visit: Payer: Self-pay | Admitting: *Deleted

## 2013-08-06 VITALS — BP 120/72 | HR 82 | Temp 98.4°F | Ht 69.0 in | Wt 212.0 lb

## 2013-08-06 DIAGNOSIS — I10 Essential (primary) hypertension: Secondary | ICD-10-CM

## 2013-08-06 DIAGNOSIS — J441 Chronic obstructive pulmonary disease with (acute) exacerbation: Secondary | ICD-10-CM

## 2013-08-06 DIAGNOSIS — E039 Hypothyroidism, unspecified: Secondary | ICD-10-CM

## 2013-08-06 MED ORDER — PREDNISONE 10 MG PO TABS: ORAL_TABLET | ORAL | Status: DC

## 2013-08-06 MED ORDER — BECLOMETHASONE DIPROPIONATE 40 MCG/ACT IN AERS: 2.0000 | INHALATION_SPRAY | Freq: Two times a day (BID) | RESPIRATORY_TRACT | Status: DC

## 2013-08-06 MED ORDER — MOMETASONE FURO-FORMOTEROL FUM 200-5 MCG/ACT IN AERO: 2.0000 | INHALATION_SPRAY | Freq: Two times a day (BID) | RESPIRATORY_TRACT | Status: DC

## 2013-08-06 NOTE — Telephone Encounter (Signed)
Patient last seen in office on 05-06-13 for chronic follow up. Please advise. If approved please print tramadol and route to Pool A so nurse can call pt to pick up

## 2013-08-06 NOTE — Patient Instructions (Signed)
Stop symbicort Start Dulera two puff twice daily Start Qvar 80 two puff twice daily Prednisone 10mg  Take 4 for three days 3 for three days 2 for three days 1 for three days and stop Return 2 months

## 2013-08-06 NOTE — Progress Notes (Signed)
Subjective:     Patient ID: Antonio Bowen, male   DOB: 1945/07/05, 68 y.o.   MRN: [id number]  HPI  68 y.o.WM Hx Boop, copd Gold B    08/06/2013 Chief Complaint  Patient presents with  . 4 month follow up    Breathing has worsened since last OV.  Has increased DOE and increased wheezing.  No chest tightness/pain or cough.  Notes progressive wheezing since last OV.   No prod cough.  No chest pain.  Notes more wheezing.  Albuterol helps  Prednisone helps as well  Past Medical History  Diagnosis Date  . Bronchitis   . Tobacco dependence   . COPD (chronic obstructive pulmonary disease)   . Hypertension   . GERD (gastroesophageal reflux disease)   . Allergic rhinitis   . Low back pain   . Arthritis   . Colon polyps   . Thyroid disease   . Kidney stone   . BOOP (bronchiolitis obliterans with organizing pneumonia)      Family History  Problem Relation Age of Onset  . Arthritis Father   . Cancer Maternal Grandfather   . Prostate cancer Maternal Grandfather   . Stomach cancer Maternal Grandmother   . Cancer Paternal Grandmother   . Colon cancer      mat 1st cousin     History   Social History  . Marital Status: Married    Spouse Name: N/A    Number of Children: 2  . Years of Education: N/A   Occupational History  . 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 Smoking status: Former Smoker -- 1.00 packs/day for 50 years    Types: Cigarettes    Quit date: 07/30/2010  . Smokeless tobacco: Current User    Types: Snuff     Comment: tobacco info given 06-27-12  . Alcohol Use: No  . Drug Use: No  . Sexual Activity: Not on file   Other Topics Concern  . Not on file   Social History Narrative  . No narrative on file     Allergies  Allergen Reactions  . Nuts Nausea And Vomiting    sweating  . Vicodin [Hydrocodone-Acetaminophen]     tachycardia  . Aspirin     Upset stomach, bleeding  . Other    Per patient, cannot take any narcotic. Nausea and vomiting  . Penicillins     Rash, hives     Outpatient Prescriptions Prior to Visit  Medication Sig Dispense Refill  . acetaminophen (TYLENOL) 500 MG tablet Take 500 mg by mouth every 6 (six) hours as needed.      Marland Kitchen albuterol (PROVENTIL HFA;VENTOLIN HFA) 108 (90 BASE) MCG/ACT inhaler Inhale 2 puffs into the lungs every 6 (six) hours as needed for wheezing.  1 Inhaler  3  . cetirizine (ZYRTEC) 10 MG tablet Take 10 mg by mouth daily.      Marland Kitchen levothyroxine (SYNTHROID, LEVOTHROID) 125 MCG tablet Take 1 tablet (125 mcg total) by mouth daily.  90 tablet  1  . losartan-hydrochlorothiazide (HYZAAR) 50-12.5 MG per tablet Take 1 tablet by mouth daily.  90 tablet  1  . meloxicam (MOBIC) 15 MG tablet Take 1 tablet (15 mg total) by mouth daily.  90 tablet  1  . omeprazole (PRILOSEC) 40 MG capsule Take 1 capsule (40 mg total) by mouth 2 (two) times daily.  180 capsule  1  .  OVER THE COUNTER MEDICATION OTC leg cramp medicine      . PRESCRIPTION MEDICATION Oxygen 2.0 liters at night, prn use in day      . traMADol (ULTRAM) 50 MG tablet Take 1 tablet (50 mg total) by mouth 2 (two) times daily.  180 tablet  0  . budesonide-formoterol (SYMBICORT) 160-4.5 MCG/ACT inhaler INHALE TWO PUFFS BY MOUTH TWICE DAILY  1 Inhaler  3  . fluticasone (FLONASE) 50 MCG/ACT nasal spray Place 2 sprays into both nostrils 2 (two) times daily.  96 g  1  . Respiratory Therapy Supplies (FLUTTER) DEVI Use three times daily  1 each  0   No facility-administered medications prior to visit.     Review of Systems  Constitutional: Positive for fatigue. Negative for diaphoresis, activity change, appetite change and unexpected weight change.  HENT: Positive for voice change. Negative for congestion, dental problem, ear discharge, facial swelling, hearing loss, mouth sores, nosebleeds, sinus pressure, sneezing, tinnitus and trouble swallowing.   Eyes: Negative for photophobia, discharge,  itching and visual disturbance.  Respiratory: Positive for chest tightness. Negative for apnea, choking and stridor.   Cardiovascular: Negative for palpitations.  Gastrointestinal: Negative for nausea, constipation, blood in stool and abdominal distention.  Genitourinary: Negative for dysuria, urgency, frequency, hematuria, flank pain, decreased urine volume and difficulty urinating.  Musculoskeletal: Positive for back pain and gait problem. Negative for arthralgias, joint swelling and neck stiffness.  Skin: Negative for color change and pallor.  Neurological: Positive for numbness. Negative for dizziness, tremors, seizures, syncope, speech difficulty, weakness and light-headedness.  Hematological: Negative for adenopathy. Does not bruise/bleed easily.  Psychiatric/Behavioral: Positive for sleep disturbance. Negative for confusion and agitation. The patient is nervous/anxious.        Objective:   Physical Exam  Filed Vitals:   08/06/13 1019  BP: 120/72  Pulse: 82  Temp: 98.4 F (36.9 C)  TempSrc: Oral  Height: 5\' 9"  (1.753 m)  Weight: 212 lb (96.163 kg)  SpO2: 97%    Gen: Pleasant, well-nourished, in no distress,  normal affect, hard of hearing  ENT: No lesions,  mouth clear,  oropharynx clear, ++ postnasal drip, bilateral nasal purulence R>L   Neck: No JVD, no TMG, no carotid bruits  Lungs: No use of accessory muscles, no dullness to percussion, expired wheezes  Cardiovascular: RRR, heart sounds normal, no murmur or gallops, no peripheral edema  Abdomen: soft and NT, no HSM,  BS normal  Musculoskeletal: No deformities, no cyanosis or clubbing  Neuro: alert, non focal  Skin: Warm, no lesions or rashes   No results found.    Assessment:     Obstructive chronic bronchitis with exacerbation Gold B Copd Chronic obstructive lung disease with asthmatic bronchitic component and associated now acute flare Plan Stop symbicort Start Dulera two puff twice daily Start Qvar  80 two puff twice daily Prednisone 10mg  Take 4 for three days 3 for three days 2 for three days 1 for three days and stop Return 2 months    increase oxygen to 3 L each bedtime  Updated Medication List Outpatient Encounter Prescriptions as of 08/06/2013  Medication Sig  . acetaminophen (TYLENOL) 500 MG tablet Take 500 mg by mouth every 6 (six) hours as needed.  Marland Kitchen albuterol (PROVENTIL HFA;VENTOLIN HFA) 108 (90 BASE) MCG/ACT inhaler Inhale 2 puffs into the lungs every 6 (six) hours as needed for wheezing.  . cetirizine (ZYRTEC) 10 MG tablet Take 10 mg by mouth daily.  . Cyanocobalamin (VITAMIN B-12 PO) Take  by mouth daily.  . fluticasone (FLONASE) 50 MCG/ACT nasal spray Place 1 spray into both nostrils 2 (two) times daily.  Marland Kitchen KRILL OIL PO Take 1 capsule by mouth daily.  Marland Kitchen levothyroxine (SYNTHROID, LEVOTHROID) 125 MCG tablet Take 1 tablet (125 mcg total) by mouth daily.  Marland Kitchen losartan-hydrochlorothiazide (HYZAAR) 50-12.5 MG per tablet Take 1 tablet by mouth daily.  . magnesium oxide (MAG-OX) 400 MG tablet Take 400 mg by mouth daily.  . meloxicam (MOBIC) 15 MG tablet Take 1 tablet (15 mg total) by mouth daily.  . Multiple Vitamin (MULTIVITAMIN) tablet Take 1 tablet by mouth daily.  Marland Kitchen omeprazole (PRILOSEC) 40 MG capsule Take 1 capsule (40 mg total) by mouth 2 (two) times daily.  Marland Kitchen OVER THE COUNTER MEDICATION OTC leg cramp medicine  . PRESCRIPTION MEDICATION Oxygen 2.0 liters at night, prn use in day  . traMADol (ULTRAM) 50 MG tablet Take 1 tablet (50 mg total) by mouth 2 (two) times daily.  . [DISCONTINUED] budesonide-formoterol (SYMBICORT) 160-4.5 MCG/ACT inhaler INHALE TWO PUFFS BY MOUTH TWICE DAILY  . [DISCONTINUED] fluticasone (FLONASE) 50 MCG/ACT nasal spray Place 2 sprays into both nostrils 2 (two) times daily.  . beclomethasone (QVAR) 40 MCG/ACT inhaler Inhale 2 puffs into the lungs 2 (two) times daily.  . mometasone-formoterol (DULERA) 200-5 MCG/ACT AERO Inhale 2 puffs into the lungs 2  (two) times daily.  . predniSONE (DELTASONE) 10 MG tablet Take 4 for three days 3 for three days 2 for three days 1 for three days and stop  . Respiratory Therapy Supplies (FLUTTER) DEVI Use three times daily         Plan:

## 2013-08-07 NOTE — Assessment & Plan Note (Signed)
Chronic obstructive lung disease with asthmatic bronchitic component and associated now acute flare Plan Stop symbicort Start Dulera two puff twice daily Start Qvar 80 two puff twice daily Prednisone 10mg  Take 4 for three days 3 for three days 2 for three days 1 for three days and stop Return 2 months

## 2013-08-20 ENCOUNTER — Telehealth: Payer: Self-pay | Admitting: Nurse Practitioner

## 2013-08-20 DIAGNOSIS — E039 Hypothyroidism, unspecified: Secondary | ICD-10-CM

## 2013-08-20 DIAGNOSIS — I10 Essential (primary) hypertension: Secondary | ICD-10-CM

## 2013-08-21 ENCOUNTER — Telehealth: Payer: Self-pay | Admitting: Internal Medicine

## 2013-08-21 DIAGNOSIS — K2210 Ulcer of esophagus without bleeding: Secondary | ICD-10-CM

## 2013-08-21 DIAGNOSIS — R1310 Dysphagia, unspecified: Secondary | ICD-10-CM

## 2013-08-21 DIAGNOSIS — K222 Esophageal obstruction: Secondary | ICD-10-CM

## 2013-08-21 DIAGNOSIS — K219 Gastro-esophageal reflux disease without esophagitis: Secondary | ICD-10-CM

## 2013-08-21 MED ORDER — LOSARTAN POTASSIUM-HCTZ 50-12.5 MG PO TABS: 1.0000 | ORAL_TABLET | Freq: Every day | ORAL | Status: DC

## 2013-08-21 MED ORDER — LEVOTHYROXINE SODIUM 125 MCG PO TABS: 125.0000 ug | ORAL_TABLET | Freq: Every day | ORAL | Status: DC

## 2013-08-21 MED ORDER — OMEPRAZOLE 40 MG PO CPDR: 40.0000 mg | DELAYED_RELEASE_CAPSULE | Freq: Two times a day (BID) | ORAL | Status: DC

## 2013-08-21 MED ORDER — MELOXICAM 15 MG PO TABS: 15.0000 mg | ORAL_TABLET | Freq: Every day | ORAL | Status: DC

## 2013-08-21 MED ORDER — FLUTICASONE PROPIONATE 50 MCG/ACT NA SUSP: 1.0000 | Freq: Two times a day (BID) | NASAL | Status: DC

## 2013-08-21 NOTE — Telephone Encounter (Signed)
done

## 2013-08-21 NOTE — Telephone Encounter (Signed)
Pt needs new script for his Omeprazole. There was some confusion because his PCP sent one script with the incorrect quantity. New script sent to pharmacy. Pt aware.

## 2013-09-03 ENCOUNTER — Telehealth: Payer: Self-pay | Admitting: Nurse Practitioner

## 2013-10-28 ENCOUNTER — Telehealth: Payer: Self-pay | Admitting: Critical Care Medicine

## 2013-10-28 NOTE — Telephone Encounter (Signed)
Attempted to call pt but the VM has not been set up.  Will try back later.     From last OV note from [facility] 07/2013.  Stop symbicort  Start Dulera two puff twice daily  Start Qvar 80 two puff twice daily  Prednisone 10mg  Take 4 for three days 3 for three days 2 for three days 1 for three days and stop  Return 2 months

## 2013-10-28 NOTE — Telephone Encounter (Signed)
Duplicate message.  attemtped to call the pt to speak with him about the other meds that are on the med list and that at the last OV PW told pt to stop the symbicort.   See other phone note.

## 2013-10-29 ENCOUNTER — Telehealth: Payer: Self-pay | Admitting: Family Medicine

## 2013-10-29 MED ORDER — BUDESONIDE-FORMOTEROL FUMARATE 160-4.5 MCG/ACT IN AERO: 2.0000 | INHALATION_SPRAY | Freq: Two times a day (BID) | RESPIRATORY_TRACT | Status: DC

## 2013-10-29 MED ORDER — BUDESONIDE-FORMOTEROL FUMARATE 80-4.5 MCG/ACT IN AERO: 2.0000 | INHALATION_SPRAY | Freq: Two times a day (BID) | RESPIRATORY_TRACT | Status: DC

## 2013-10-29 NOTE — Telephone Encounter (Signed)
retruning a call

## 2013-10-29 NOTE — Telephone Encounter (Signed)
Spoke with pt's wife -  Reports pt did not start the dulera or qvar because one of the medications, could not remember which one, was close to $300.  Wife states pt stayed on the symbicort 160 2 puffs bid.  He is currently out of medication and would like a rx called in today.  [place]  I called [facility], spoke with Jonelle Sidle.  Was advised the qvar will be $45 and dulera will be $257.99 with no PA available.    Dr. Joya Gaskins, pls advise if it is ok with pt staying on symbicort.  If so, will need rxs sent.  Thank you.

## 2013-10-29 NOTE — Telephone Encounter (Signed)
Wise for now just stay on symbicort 160 two puff bid

## 2013-10-29 NOTE — Telephone Encounter (Signed)
Wants symbicort samples Patient aware up front

## 2013-10-29 NOTE — Telephone Encounter (Signed)
Called, spoke with pt's wife.  Informed her of below per Dr. Joya Gaskins.  She verbalized understanding and is aware symbicort rx was sent to [facility] in [place].  She voiced no further questions or concerns at this time.

## 2013-10-29 NOTE — Telephone Encounter (Signed)
ATC # provided above - received msg that VM has not yet been set up. ATC pt's home # - [id number]

## 2014-01-14 ENCOUNTER — Ambulatory Visit (INDEPENDENT_AMBULATORY_CARE_PROVIDER_SITE_OTHER): Payer: Commercial Managed Care - HMO | Admitting: Critical Care Medicine

## 2014-01-14 ENCOUNTER — Encounter: Payer: Self-pay | Admitting: Critical Care Medicine

## 2014-01-14 VITALS — BP 122/74 | HR 71 | Temp 97.8°F | Ht 69.0 in | Wt 208.0 lb

## 2014-01-14 DIAGNOSIS — J441 Chronic obstructive pulmonary disease with (acute) exacerbation: Secondary | ICD-10-CM

## 2014-01-14 DIAGNOSIS — Z23 Encounter for immunization: Secondary | ICD-10-CM

## 2014-01-14 MED ORDER — FLUTICASONE PROPIONATE 50 MCG/ACT NA SUSP: 2.0000 | Freq: Two times a day (BID) | NASAL | Status: DC

## 2014-01-14 NOTE — Progress Notes (Signed)
Subjective:    Patient ID: Antonio Bowen, male    DOB: [birth year], 68 y.o.   MRN: [id number]  HPI  01/14/2014 Chief Complaint  Patient presents with  . Follow-up    Breathing worsened with heat.  Is  now starting to improve with the cooler weather.  Has some chest tightness. No cough.   Likes it cooler without the heat.  Notes some chest tightness. No real mucus.  Notes some wheezing. Pt denies any significant sore throat, nasal congestion or excess secretions, fever, chills, sweats, unintended weight loss, pleurtic or exertional chest pain, orthopnea PND, or leg swelling Pt denies any increase in rescue therapy over baseline, denies waking up needing it or having any early am or nocturnal exacerbations of coughing/wheezing/or dyspnea. Pt also denies any obvious fluctuation in symptoms with  weather or environmental change or other alleviating or aggravating factors  Oxygen 3L QHS and helps.   This patient continues to use and benefit from her oxygen therapy and has re qualified at this visit for continued oxygen therapy     Review of Systems Constitutional:   No  weight loss, night sweats,  Fevers, chills, fatigue, lassitude. HEENT:   No headaches,  Difficulty swallowing,  Tooth/dental problems,  Sore throat,                No sneezing, itching, ear ache, nasal congestion, post nasal drip,   CV:  No chest pain,  Orthopnea, PND, swelling in lower extremities, anasarca, dizziness, palpitations  GI  No heartburn, indigestion, abdominal pain, nausea, vomiting, diarrhea, change in bowel habits, loss of appetite  Resp: No shortness of breath with exertion or at rest.  No excess mucus, no productive cough,  No non-productive cough,  No coughing up of blood.  No change in color of mucus.  No wheezing.  No chest wall deformity  Skin: no rash or lesions.  GU: no dysuria, change in color of urine, no urgency or frequency.  No flank pain.  MS:  No joint pain or swelling.  No decreased  range of motion.  No back pain.  Psych:  No change in mood or affect. No depression or anxiety.  No memory loss.     Objective:   Physical Exam Filed Vitals:   01/14/14 1034  BP: 122/74  Pulse: 71  Temp: 97.8 F (36.6 C)  TempSrc: Oral  Height: 5\' 9"  (1.753 m)  Weight: 208 lb (94.348 kg)  SpO2: 98%    Gen: Pleasant, well-nourished, in no distress,  normal affect  ENT: No lesions,  mouth clear,  oropharynx clear, no postnasal drip  Neck: No JVD, no TMG, no carotid bruits  Lungs: No use of accessory muscles, no dullness to percussion, distant breath sounds Cardiovascular: RRR, heart sounds normal, no murmur or gallops, no peripheral edema  Abdomen: soft and NT, no HSM,  BS normal  Musculoskeletal: No deformities, no cyanosis or clubbing  Neuro: alert, non focal  Skin: Warm, no lesions or rashes  No results found.     Assessment & Plan:   Obstructive chronic bronchitis with exacerbation Gold B Copd Chronic obstructive lung disease gold stage B.  Plan Flu vaccine and prevnar 13 given Return 4 months with spirometry same day Flonase refill sent Stay on symbicort two puff twice daily     Updated Medication List Outpatient Encounter Prescriptions as of 01/14/2014  Medication Sig  . acetaminophen (TYLENOL) 500 MG tablet Take 500 mg by mouth every 6 (six) hours as  needed.  Marland Kitchen albuterol (PROVENTIL HFA;VENTOLIN HFA) 108 (90 BASE) MCG/ACT inhaler Inhale 2 puffs into the lungs every 6 (six) hours as needed for wheezing.  . budesonide-formoterol (SYMBICORT) 160-4.5 MCG/ACT inhaler Inhale 2 puffs into the lungs 2 (two) times daily.  . cetirizine (ZYRTEC) 10 MG tablet Take 10 mg by mouth daily.  . Cyanocobalamin (VITAMIN B-12 PO) Take by mouth daily.  . fluticasone (FLONASE) 50 MCG/ACT nasal spray Place 2 sprays into both nostrils 2 (two) times daily.  Marland Kitchen levothyroxine (SYNTHROID, LEVOTHROID) 125 MCG tablet Take 1 tablet (125 mcg total) by mouth daily.  Marland Kitchen  losartan-hydrochlorothiazide (HYZAAR) 50-12.5 MG per tablet Take 1 tablet by mouth daily.  . magnesium oxide (MAG-OX) 400 MG tablet Take 400 mg by mouth daily.  . meloxicam (MOBIC) 15 MG tablet Take 1 tablet (15 mg total) by mouth daily.  . Multiple Vitamin (MULTIVITAMIN) tablet Take 1 tablet by mouth daily.  Marland Kitchen omeprazole (PRILOSEC) 40 MG capsule Take 1 capsule (40 mg total) by mouth 2 (two) times daily.  Marland Kitchen OVER THE COUNTER MEDICATION OTC leg cramp medicine  . PRESCRIPTION MEDICATION Oxygen 3.0 liters at night, prn use in day  . Respiratory Therapy Supplies (FLUTTER) DEVI Use three times daily  . traMADol (ULTRAM) 50 MG tablet Take 1 tablet (50 mg total) by mouth 2 (two) times daily.  . [DISCONTINUED] fluticasone (FLONASE) 50 MCG/ACT nasal spray Place 1 spray into both nostrils 2 (two) times daily.  . [DISCONTINUED] fluticasone (FLONASE) 50 MCG/ACT nasal spray Place 2 sprays into both nostrils 2 (two) times daily.  . [DISCONTINUED] beclomethasone (QVAR) 40 MCG/ACT inhaler Inhale 2 puffs into the lungs 2 (two) times daily.  . [DISCONTINUED] budesonide-formoterol (SYMBICORT) 80-4.5 MCG/ACT inhaler Inhale 2 puffs into the lungs 2 (two) times daily.  . [DISCONTINUED] KRILL OIL PO Take 1 capsule by mouth daily.  . [DISCONTINUED] mometasone-formoterol (DULERA) 200-5 MCG/ACT AERO Inhale 2 puffs into the lungs 2 (two) times daily.  . [DISCONTINUED] predniSONE (DELTASONE) 10 MG tablet Take 4 for three days 3 for three days 2 for three days 1 for three days and stop

## 2014-01-14 NOTE — Patient Instructions (Signed)
Flu vaccine and prevnar 13 given Return 4 months with spirometry same day Flonase refill sent Stay on symbicort two puff twice daily

## 2014-01-15 NOTE — Assessment & Plan Note (Signed)
Chronic obstructive lung disease gold stage B.  Plan Flu vaccine and prevnar 13 given Return 4 months with spirometry same day Flonase refill sent Stay on symbicort two puff twice daily

## 2014-01-25 ENCOUNTER — Other Ambulatory Visit: Payer: Self-pay | Admitting: Family Medicine

## 2014-01-25 ENCOUNTER — Telehealth: Payer: Self-pay | Admitting: Critical Care Medicine

## 2014-01-25 MED ORDER — FLUTICASONE PROPIONATE 50 MCG/ACT NA SUSP: 2.0000 | Freq: Two times a day (BID) | NASAL | Status: DC

## 2014-01-25 NOTE — Telephone Encounter (Signed)
LM for pt to return call regarding below ------ Spoke with [employer]--states that they received a refill request for Fluticasone nasal spray 79mcg 2 spray BID #48g Pharmacist arguing that this dose is too high and needs to be clarified with the Dr before being filled. I advised the pharmacist that the Rx was sent by Dr Joya Gaskins himself and that this is the dose that was prescribed and has been prescribed for a while. Pharmacist refused to fill until we spoke with Dr Joya Gaskins to get approval.   Please advise that the 2 puff BID dose 48g nasal solution is correct for documentation purposes. Thanks.

## 2014-01-25 NOTE — Telephone Encounter (Signed)
He has SEVERE allergic rhinitis, this the dose we use for this problem

## 2014-01-25 NOTE — Telephone Encounter (Signed)
Spoke with pt -- aware that we will contact [employer] and speak with pharmacist giving Rx instruction clarification.  [place] [phone] -- placed on 55min hold. Will call back in the AM  Pt ID: [id number]

## 2014-01-26 NOTE — Telephone Encounter (Signed)
Called and spoke with [facility] and confirmed the original script of flonase 29mcg 2 puffs each nostril BID for 90 day supply for 3 refills. Nothing further needed.

## 2014-02-18 ENCOUNTER — Telehealth: Payer: Self-pay | Admitting: Critical Care Medicine

## 2014-02-18 NOTE — Telephone Encounter (Signed)
Spoke with pt. He reports he received a letter from [facility]. The letter stated they have tried to contact us with no response back for PA on flonase. # to call [phone]. ID # [id number] Called this # and was told to call [phone].  Spoke with Brandy to start PA.  It will go into clinical review and can take 24-72 hrs. We will received approval/denial via fax Ref # [id number]  Pt is aware we will call him once we received approval/denial and how long it can take to receive this. Will forward to Crystal to follow up on.  Please advise thanks

## 2014-02-23 NOTE — Telephone Encounter (Signed)
Crystal have you received any updates on PA for patient? Thanks

## 2014-02-26 MED ORDER — FLUTICASONE PROPIONATE 50 MCG/ACT NA SUSP: 2.0000 | Freq: Two times a day (BID) | NASAL | Status: DC

## 2014-02-26 NOTE — Telephone Encounter (Signed)
Called to check on the status of the PA for the flonase.   [employer] will only cover 1 bottle of the flonase per month.  Called and spoke with pt and he is aware of this and requested that rx be sent to the mail order pharmacy.  This has been done and nothing further is needed.

## 2014-04-26 ENCOUNTER — Other Ambulatory Visit: Payer: Self-pay | Admitting: Critical Care Medicine

## 2014-05-04 ENCOUNTER — Other Ambulatory Visit: Payer: Self-pay

## 2014-05-12 DIAGNOSIS — J449 Chronic obstructive pulmonary disease, unspecified: Secondary | ICD-10-CM | POA: Diagnosis not present

## 2014-06-12 DIAGNOSIS — J449 Chronic obstructive pulmonary disease, unspecified: Secondary | ICD-10-CM | POA: Diagnosis not present

## 2014-07-11 DIAGNOSIS — J449 Chronic obstructive pulmonary disease, unspecified: Secondary | ICD-10-CM | POA: Diagnosis not present

## 2014-08-11 DIAGNOSIS — J449 Chronic obstructive pulmonary disease, unspecified: Secondary | ICD-10-CM | POA: Diagnosis not present

## 2014-08-26 ENCOUNTER — Encounter: Payer: Self-pay | Admitting: Critical Care Medicine

## 2014-08-26 ENCOUNTER — Ambulatory Visit (INDEPENDENT_AMBULATORY_CARE_PROVIDER_SITE_OTHER): Payer: Commercial Managed Care - HMO | Admitting: Critical Care Medicine

## 2014-08-26 ENCOUNTER — Ambulatory Visit (HOSPITAL_BASED_OUTPATIENT_CLINIC_OR_DEPARTMENT_OTHER)
Admission: RE | Admit: 2014-08-26 | Discharge: 2014-08-26 | Disposition: A | Discharge status: Home / Self Care | Payer: Commercial Managed Care - HMO | Source: Ambulatory Visit | Attending: Critical Care Medicine | Admitting: Critical Care Medicine

## 2014-08-26 VITALS — BP 100/69 | HR 86 | Temp 98.1°F | Ht 69.0 in | Wt 211.0 lb

## 2014-08-26 DIAGNOSIS — J45909 Unspecified asthma, uncomplicated: Secondary | ICD-10-CM | POA: Diagnosis not present

## 2014-08-26 DIAGNOSIS — J449 Chronic obstructive pulmonary disease, unspecified: Secondary | ICD-10-CM | POA: Diagnosis not present

## 2014-08-26 DIAGNOSIS — J441 Chronic obstructive pulmonary disease with (acute) exacerbation: Secondary | ICD-10-CM

## 2014-08-26 DIAGNOSIS — R0600 Dyspnea, unspecified: Secondary | ICD-10-CM | POA: Diagnosis present

## 2014-08-26 MED ORDER — FLUTICASONE PROPIONATE 50 MCG/ACT NA SUSP: 2.0000 | Freq: Two times a day (BID) | NASAL | Status: DC

## 2014-08-26 MED ORDER — FLUTICASONE FUROATE-VILANTEROL 200-25 MCG/INH IN AEPB: 1.0000 | INHALATION_SPRAY | Freq: Every day | RESPIRATORY_TRACT | Status: DC

## 2014-08-26 MED ORDER — FLUTICASONE PROPIONATE 50 MCG/ACT NA SUSP
2.0000 | Freq: Two times a day (BID) | NASAL | Status: DC
Start: 2014-08-26 — End: 2014-08-26

## 2014-08-26 NOTE — Patient Instructions (Addendum)
Weight loss advised Stop symbicort  Start Breo one puff daily No other changes Chest xray today  Return 3 months

## 2014-08-26 NOTE — Progress Notes (Signed)
Subjective:    Patient ID: Antonio Bowen, male    DOB: 08-Aug-1945, 69 y.o.   MRN: [id number]  HPI  08/26/2014 Chief Complaint  Patient presents with  . Follow-up    breathing is no better.  unable to do as much as he would like.  Wheezing more.  SOB is worse.  Going up hills or doing anything causes him to become SOB.  more fatigue.  Needs Handwritten prescription for Tramadol and Flonase for 90 day supply.   Pt is wheezing more, no real cough. Pt takes tramadol for hip and back.  Dyspnea is the issue, not the cough.  Pt gets hotter and sweats more. Sees [facility] for pain rx   If gets dyspneic will panic.  No fever, chills.  No mucus.  Some pndrip, allergies The patient continues to use oxygen at night and is benefiting from this  Wt Readings from Last 3 Encounters:  08/26/14 211 lb (95.709 kg)  01/14/14 208 lb (94.348 kg)  08/06/13 212 lb (96.163 kg)    Review of Systems Constitutional:   No  weight loss, night sweats,  Fevers, chills, fatigue, lassitude. HEENT:   No headaches,  Difficulty swallowing,  Tooth/dental problems,  Sore throat,                No sneezing, itching, ear ache, nasal congestion, post nasal drip,   CV:  No chest pain,  Orthopnea, PND, swelling in lower extremities, anasarca, dizziness, palpitations  GI  No heartburn, indigestion, abdominal pain, nausea, vomiting, diarrhea, change in bowel habits, loss of appetite  Resp: Notes  shortness of breath with exertion not at rest.  No excess mucus, no productive cough,  No non-productive cough,  No coughing up of blood.  No change in color of mucus.  No wheezing.  No chest wall deformity  Skin: no rash or lesions.  GU: no dysuria, change in color of urine, no urgency or frequency.  No flank pain.  MS:  No joint pain or swelling.  No decreased range of motion.  No back pain.  Psych:  No change in mood or affect. No depression or anxiety.  No memory loss.     Objective:   Physical Exam Filed Vitals:   08/26/14 1043  BP: 100/69  Pulse: 86  Temp: 98.1 F (36.7 C)  TempSrc: Oral  Height: 5\' 9"  (1.753 m)  Weight: 211 lb (95.709 kg)  SpO2: 93%    Gen: Pleasant, well-nourished, in no distress,  normal affect  ENT: No lesions,  mouth clear,  oropharynx clear, no postnasal drip  Neck: No JVD, no TMG, no carotid bruits  Lungs: No use of accessory muscles, no dullness to percussion, distant breath sounds Cardiovascular: RRR, heart sounds normal, no murmur or gallops, no peripheral edema  Abdomen: soft and NT, no HSM,  BS normal  Musculoskeletal: No deformities, no cyanosis or clubbing  Neuro: alert, non focal  Skin: Warm, no lesions or rashes  Dg Chest 2 View  08/26/2014   CLINICAL DATA:  History of asthma and COPD, chronic dyspnea, former smoker.  EXAM: CHEST  2 VIEW  COMPARISON:  PA and lateral chest x-ray of May 29, 2012  FINDINGS: The lungs are adequately inflated. The interstitial markings are mildly increased. There are areas of coarse airspace opacification in both lungs which are stable. There are bullous changes in the apices. There are no air bronchograms. There is no pleural effusion or pneumothorax. The heart and pulmonary vascularity are normal.  The mediastinum is normal in width. The bony thorax exhibits no acute abnormality.  IMPRESSION: COPD with bullous changes in the apices and stable pleural parenchymal scarring. There is no acute cardiopulmonary abnormality.   Electronically Signed   By: David  Martinique M.D.   On: 08/26/2014 13:19   Spirometry 08/26/2014: NORMAL Amb ra sats 99%    Assessment & Plan:   Obstructive chronic bronchitis with exacerbation Gold B Copd Chronic obstructive lung disease gold stage B with improvement on current pulmonary function testing Would like to change patient to an inhaled medication program that would be easier for the patient to manage and maintain Plan Weight loss advised Stop symbicort  Start Breo one puff daily No other  changes Chest xray today  Return 3 months      Updated Medication List Outpatient Encounter Prescriptions as of 08/26/2014  Medication Sig  . acetaminophen (TYLENOL) 500 MG tablet Take 500 mg by mouth every 6 (six) hours as needed.  . cetirizine (ZYRTEC) 10 MG tablet Take 10 mg by mouth daily.  . Cyanocobalamin (VITAMIN B-12 PO) Take by mouth daily.  . fluticasone (FLONASE) 50 MCG/ACT nasal spray Place 2 sprays into both nostrils 2 (two) times daily.  Marland Kitchen levothyroxine (SYNTHROID, LEVOTHROID) 125 MCG tablet Take 1 tablet (125 mcg total) by mouth daily.  Marland Kitchen losartan-hydrochlorothiazide (HYZAAR) 50-12.5 MG per tablet TAKE 1 TABLET EVERY DAY  . magnesium oxide (MAG-OX) 400 MG tablet Take 400 mg by mouth daily.  . meloxicam (MOBIC) 15 MG tablet TAKE 1 TABLET EVERY DAY  . omeprazole (PRILOSEC) 40 MG capsule Take 1 capsule (40 mg total) by mouth 2 (two) times daily.  Marland Kitchen OVER THE COUNTER MEDICATION OTC leg cramp medicine  . PRESCRIPTION MEDICATION Oxygen 3.0 liters at night, prn use in day  . Respiratory Therapy Supplies (FLUTTER) DEVI Use three times daily  . traMADol (ULTRAM) 50 MG tablet Take 1 tablet (50 mg total) by mouth 2 (two) times daily.  . VENTOLIN HFA 108 (90 BASE) MCG/ACT inhaler INHALE TWO PUFFS INTO LUNGS EVERY 6 HOURS AS NEEDED FOR WHEEZING  . [DISCONTINUED] budesonide-formoterol (SYMBICORT) 160-4.5 MCG/ACT inhaler Inhale 2 puffs into the lungs 2 (two) times daily.  . [DISCONTINUED] fluticasone (FLONASE) 50 MCG/ACT nasal spray Place 2 sprays into both nostrils 2 (two) times daily.  . [DISCONTINUED] fluticasone (FLONASE) 50 MCG/ACT nasal spray Place 2 sprays into both nostrils 2 (two) times daily.  . Fluticasone Furoate-Vilanterol (BREO ELLIPTA) 200-25 MCG/INH AEPB Inhale 1 puff into the lungs daily.  . [DISCONTINUED] Multiple Vitamin (MULTIVITAMIN) tablet Take 1 tablet by mouth daily.

## 2014-08-26 NOTE — Assessment & Plan Note (Signed)
Chronic obstructive lung disease gold stage B with improvement on current pulmonary function testing Would like to change patient to an inhaled medication program that would be easier for the patient to manage and maintain Plan Weight loss advised Stop symbicort  Start Breo one puff daily No other changes Chest xray today  Return 3 months

## 2014-08-27 ENCOUNTER — Other Ambulatory Visit: Payer: Self-pay | Admitting: *Deleted

## 2014-08-27 MED ORDER — ALBUTEROL SULFATE HFA 108 (90 BASE) MCG/ACT IN AERS: INHALATION_SPRAY | RESPIRATORY_TRACT | Status: DC

## 2014-08-27 NOTE — Progress Notes (Signed)
Quick Note:  Spoke with pt regarding cxr results and recs per Dr. Joya Gaskins. He verbalized understanding. Dr. Joya Gaskins, pt states he's been dx'd with BOOP in the past. Requesting update status of this. Please advise. Thank you. ______

## 2014-08-27 NOTE — Progress Notes (Signed)
Quick Note:  lmomtcb for pt on home and cell #s ______ 

## 2014-09-06 ENCOUNTER — Other Ambulatory Visit: Payer: Self-pay | Admitting: Internal Medicine

## 2014-09-08 ENCOUNTER — Other Ambulatory Visit: Payer: Self-pay

## 2014-09-08 NOTE — Telephone Encounter (Signed)
Last seen Dr Ernestina Patches 1/16  This is for mail order

## 2014-09-09 MED ORDER — MELOXICAM 15 MG PO TABS: 15.0000 mg | ORAL_TABLET | Freq: Every day | ORAL | Status: DC

## 2014-09-10 DIAGNOSIS — J449 Chronic obstructive pulmonary disease, unspecified: Secondary | ICD-10-CM | POA: Diagnosis not present

## 2014-09-22 ENCOUNTER — Telehealth: Payer: Self-pay | Admitting: Critical Care Medicine

## 2014-09-22 NOTE — Telephone Encounter (Signed)
Patient received a letter from [facility] stating that Fluticasone needs Prior Authorization in order to refill medications.  [phone] Prescription is not available as written.  Field seismologist for [facility] pharmacy: [place] - held online for 20 minutes awaiting someone to respond. Sent request through CoverMyMeds Key: [id number] The request has received a Pending outcome. will receive a final determination electronically in CoverMyMeds and via email and fax within 24 to 72 hours.  To [facility] for follow up

## 2014-09-28 NOTE — Telephone Encounter (Signed)
Per CoverMyMeds.com, fluticasone PA has been approved.   atc pt, line rang for over 1 minute with no answer or vm.   Will resend rx after verifying pharmacy with pt.

## 2014-09-29 MED ORDER — FLUTICASONE PROPIONATE 50 MCG/ACT NA SUSP: 2.0000 | Freq: Two times a day (BID) | NASAL | Status: DC

## 2014-09-29 NOTE — Telephone Encounter (Signed)
Spoke with pt's wife, [name]. They would like this prescription to be sent in to [place]. This has been done. Nothing further was needed.

## 2014-10-11 ENCOUNTER — Telehealth: Payer: Self-pay | Admitting: Critical Care Medicine

## 2014-10-11 DIAGNOSIS — J449 Chronic obstructive pulmonary disease, unspecified: Secondary | ICD-10-CM | POA: Diagnosis not present

## 2014-10-11 NOTE — Telephone Encounter (Signed)
Called and gave clarification. Nothing further needed

## 2014-11-05 ENCOUNTER — Other Ambulatory Visit: Payer: Self-pay | Admitting: Nurse Practitioner

## 2014-11-10 DIAGNOSIS — J449 Chronic obstructive pulmonary disease, unspecified: Secondary | ICD-10-CM | POA: Diagnosis not present

## 2014-12-08 ENCOUNTER — Other Ambulatory Visit: Payer: Self-pay | Admitting: Critical Care Medicine

## 2014-12-11 DIAGNOSIS — J449 Chronic obstructive pulmonary disease, unspecified: Secondary | ICD-10-CM | POA: Diagnosis not present

## 2014-12-13 ENCOUNTER — Other Ambulatory Visit: Payer: Self-pay | Admitting: Critical Care Medicine

## 2014-12-13 ENCOUNTER — Telehealth: Payer: Self-pay | Admitting: Critical Care Medicine

## 2014-12-13 MED ORDER — BUDESONIDE-FORMOTEROL FUMARATE 160-4.5 MCG/ACT IN AERO: 2.0000 | INHALATION_SPRAY | Freq: Two times a day (BID) | RESPIRATORY_TRACT | Status: DC

## 2014-12-13 NOTE — Telephone Encounter (Signed)
Per 08/26/14 OV;  Patient Instructions       Weight loss advised Stop symbicort   Start Breo one puff daily  --  Called spoke with pt. He reports breo did nothing for him. He reports after he had finished the breo sample he had symbicort inhaler left and that's what he had been taking. He wants symbicort to be called in. Please advise Dr. Joya Gaskins thanks

## 2014-12-13 NOTE — Telephone Encounter (Signed)
i am ok with symbicort 160 refill 2 puff bid

## 2014-12-13 NOTE — Telephone Encounter (Signed)
rx called to pharmacy.  Pt aware.  Nothing further needed.

## 2015-01-11 DIAGNOSIS — J449 Chronic obstructive pulmonary disease, unspecified: Secondary | ICD-10-CM | POA: Diagnosis not present

## 2015-02-02 ENCOUNTER — Telehealth: Payer: Self-pay | Admitting: Internal Medicine

## 2015-02-04 MED ORDER — OMEPRAZOLE 40 MG PO CPDR: 40.0000 mg | DELAYED_RELEASE_CAPSULE | Freq: Two times a day (BID) | ORAL | Status: DC

## 2015-02-04 NOTE — Telephone Encounter (Signed)
Refilled Omeprazole.  Called patient and lm on vm that I had refilled the medication but that, because he has not an office visit with Dr. Henrene Pastor in over 2 years, he needed to call the office to schedule an appointment in order to get further refills

## 2015-02-10 DIAGNOSIS — J449 Chronic obstructive pulmonary disease, unspecified: Secondary | ICD-10-CM | POA: Diagnosis not present

## 2015-02-18 ENCOUNTER — Emergency Department (HOSPITAL_COMMUNITY): Payer: Commercial Managed Care - HMO

## 2015-02-18 ENCOUNTER — Encounter (HOSPITAL_COMMUNITY): Payer: Self-pay

## 2015-02-18 ENCOUNTER — Emergency Department (HOSPITAL_COMMUNITY)
Admission: EM | Admit: 2015-02-18 | Discharge: 2015-02-18 | Disposition: A | Discharge status: Home / Self Care | Payer: Commercial Managed Care - HMO | Attending: Emergency Medicine | Admitting: Emergency Medicine

## 2015-02-18 ENCOUNTER — Encounter: Payer: Self-pay | Admitting: Family Medicine

## 2015-02-18 ENCOUNTER — Ambulatory Visit (INDEPENDENT_AMBULATORY_CARE_PROVIDER_SITE_OTHER): Payer: Commercial Managed Care - HMO | Admitting: Family Medicine

## 2015-02-18 VITALS — BP 138/81 | HR 73 | Ht 69.0 in

## 2015-02-18 DIAGNOSIS — Z79899 Other long term (current) drug therapy: Secondary | ICD-10-CM | POA: Diagnosis not present

## 2015-02-18 DIAGNOSIS — Z880 Allergy status to penicillin: Secondary | ICD-10-CM | POA: Insufficient documentation

## 2015-02-18 DIAGNOSIS — M1990 Unspecified osteoarthritis, unspecified site: Secondary | ICD-10-CM | POA: Diagnosis not present

## 2015-02-18 DIAGNOSIS — K219 Gastro-esophageal reflux disease without esophagitis: Secondary | ICD-10-CM | POA: Diagnosis not present

## 2015-02-18 DIAGNOSIS — R61 Generalized hyperhidrosis: Secondary | ICD-10-CM | POA: Diagnosis not present

## 2015-02-18 DIAGNOSIS — R110 Nausea: Secondary | ICD-10-CM | POA: Diagnosis not present

## 2015-02-18 DIAGNOSIS — R1013 Epigastric pain: Secondary | ICD-10-CM | POA: Insufficient documentation

## 2015-02-18 DIAGNOSIS — R55 Syncope and collapse: Secondary | ICD-10-CM

## 2015-02-18 DIAGNOSIS — E079 Disorder of thyroid, unspecified: Secondary | ICD-10-CM | POA: Diagnosis not present

## 2015-02-18 DIAGNOSIS — R531 Weakness: Secondary | ICD-10-CM | POA: Diagnosis not present

## 2015-02-18 DIAGNOSIS — R404 Transient alteration of awareness: Secondary | ICD-10-CM | POA: Diagnosis not present

## 2015-02-18 DIAGNOSIS — Z7951 Long term (current) use of inhaled steroids: Secondary | ICD-10-CM | POA: Diagnosis not present

## 2015-02-18 DIAGNOSIS — R079 Chest pain, unspecified: Secondary | ICD-10-CM | POA: Diagnosis present

## 2015-02-18 DIAGNOSIS — Z87442 Personal history of urinary calculi: Secondary | ICD-10-CM | POA: Diagnosis not present

## 2015-02-18 DIAGNOSIS — J449 Chronic obstructive pulmonary disease, unspecified: Secondary | ICD-10-CM | POA: Diagnosis not present

## 2015-02-18 DIAGNOSIS — Z86010 Personal history of colonic polyps: Secondary | ICD-10-CM | POA: Diagnosis not present

## 2015-02-18 DIAGNOSIS — R42 Dizziness and giddiness: Secondary | ICD-10-CM | POA: Insufficient documentation

## 2015-02-18 DIAGNOSIS — Z87891 Personal history of nicotine dependence: Secondary | ICD-10-CM | POA: Diagnosis not present

## 2015-02-18 DIAGNOSIS — R0789 Other chest pain: Secondary | ICD-10-CM | POA: Diagnosis not present

## 2015-02-18 DIAGNOSIS — I10 Essential (primary) hypertension: Secondary | ICD-10-CM | POA: Diagnosis not present

## 2015-02-18 LAB — I-STAT TROPONIN, ED: Troponin i, poc: 0 ng/mL (ref 0.00–0.08)

## 2015-02-18 LAB — CBC WITH DIFFERENTIAL/PLATELET
Basophils Absolute: 0 10*3/uL (ref 0.0–0.1)
Basophils Relative: 0 %
Eosinophils Absolute: 0 10*3/uL (ref 0.0–0.7)
Eosinophils Relative: 0 %
HCT: 48.7 % (ref 39.0–52.0)
Hemoglobin: 16.9 g/dL (ref 13.0–17.0)
Lymphocytes Relative: 23 %
Lymphs Abs: 3 10*3/uL (ref 0.7–4.0)
MCH: 32 pg (ref 26.0–34.0)
MCHC: 34.7 g/dL (ref 30.0–36.0)
MCV: 92.2 fL (ref 78.0–100.0)
Monocytes Absolute: 0.5 10*3/uL (ref 0.1–1.0)
Monocytes Relative: 4 %
Neutro Abs: 9.6 10*3/uL — ABNORMAL HIGH (ref 1.7–7.7)
Neutrophils Relative %: 73 %
Platelets: 156 10*3/uL (ref 150–400)
RBC: 5.28 MIL/uL (ref 4.22–5.81)
RDW: 13.2 % (ref 11.5–15.5)
WBC: 13.1 10*3/uL — ABNORMAL HIGH (ref 4.0–10.5)

## 2015-02-18 LAB — BASIC METABOLIC PANEL
Anion gap: 9 (ref 5–15)
BUN: 15 mg/dL (ref 6–20)
CO2: 27 mmol/L (ref 22–32)
Calcium: 9.4 mg/dL (ref 8.9–10.3)
Chloride: 99 mmol/L — ABNORMAL LOW (ref 101–111)
Creatinine, Ser: 1.57 mg/dL — ABNORMAL HIGH (ref 0.61–1.24)
GFR calc Af Amer: 50 mL/min — ABNORMAL LOW (ref >60)
GFR calc non Af Amer: 43 mL/min — ABNORMAL LOW (ref >60)
Glucose, Bld: 124 mg/dL — ABNORMAL HIGH (ref 65–99)
Potassium: 4.1 mmol/L (ref 3.5–5.1)
Sodium: 135 mmol/L (ref 135–145)

## 2015-02-18 LAB — TROPONIN I
Troponin I: <0.03 ng/mL (ref <0.031)
Troponin I: <0.03 ng/mL (ref <0.031)

## 2015-02-18 NOTE — ED Notes (Signed)
Patient refused wheelchair. 

## 2015-02-18 NOTE — ED Notes (Signed)
Per EMS - pt exercised this morning, went to shower later on and began feeling dizzy, diaphoretic, nauseated. C/o epigastric pain. Pt vomited 1x. Pt also c/o bilateral extremity weakness. Went to PCP and sent here. Denies pain at this time.   Pt has taken 2-81mg  aspirins, 1 nitro at dr office, 1 sl zofran at dr office.

## 2015-02-18 NOTE — ED Provider Notes (Signed)
CSN: [id number]     Arrival date & time 02/18/15  1037 History   First MD Initiated Contact with Patient 02/18/15 1042     Chief Complaint  Patient presents with  . Chest Pain     (Consider location/radiation/quality/duration/timing/severity/associated sxs/prior Treatment) HPI  PCP: Redge Gainer, MD PMH: bronchitis, COPD, hypertension, GERD, allergic rhinitis, low back pain, arthritis, colon polyps, thyroid disease, kidney stone, BOOP  Antonio Bowen is a 69 y.o.  male  CHIEF COMPLAINT: chest pain/epigastric painwith dizziness, diaphoresis and nausea.  The patient worked out this morning and then when going to shower he started to feel dizzy, diaphoretic and nauseous. He has 1 episode of vomiting clear fluid. He is also experienced transient global weakness he was still able to ambulate. He went to his PCP who recommend he come to the ED. He denies CP but says while all of this was going on he felt his chest feel like it was swelling. At the PCP office he was given 324 mg aspirin, 1 SL nitro and 1 SL  Zofran patient reports feeling back to baseline.  Pt reports clean heart cath in approximately around 2010. Denies hx of cardiac disease or stroke.   ROS: The patient deniesfever, headache, weakness (focal), confusion, change of vision,  dysphagia, aphagia, shortness of breath,  abdominal pains,  diarrhea, lower extremity swelling, rash, neck pain, chest pain    Past Medical History  Diagnosis Date  . Bronchitis   . Tobacco dependence   . COPD (chronic obstructive pulmonary disease) ([place])   . Hypertension   . GERD (gastroesophageal reflux disease)   . Allergic rhinitis   . Low back pain   . Arthritis   . Colon polyps   . Thyroid disease   . Kidney stone   . BOOP (bronchiolitis obliterans with organizing pneumonia) [facility])    Past Surgical History  Procedure Laterality Date  . Other surgical history      facial surgery  . Total hip arthroplasty  01/2011    Right hip,  [place]  . Carpel tunnal      left  . Ulnar nerve transposition      left  . Dupuytren contracture release      Left  . Facial fracture surgery     Family History  Problem Relation Age of Onset  . Arthritis Father   . Stroke Father   . Cancer Maternal Grandfather   . Prostate cancer Maternal Grandfather   . Stomach cancer Maternal Grandmother   . Cancer Paternal Grandmother   . Colon cancer      mat 1st cousin   Social History  Substance Use Topics  . Smoking status: Former Smoker -- 1.00 packs/day for 50 years    Types: Cigarettes    Quit date: 07/30/2010  . Smokeless tobacco: Current User    Types: Snuff     Comment: tobacco info given 06-27-12  . Alcohol Use: No    Review of Systems  10 Systems reviewed and are negative for acute change except as noted in the HPI.     Allergies  Nuts; Vicodin; Aspirin; Other; and Penicillins  Home Medications   Prior to Admission medications   Medication Sig Start Date End Date Taking? Authorizing Provider  acetaminophen (TYLENOL) 500 MG tablet Take 1,000 mg by mouth every 6 (six) hours as needed (pain).    Yes Historical Provider, MD  albuterol (VENTOLIN HFA) 108 (90 BASE) MCG/ACT inhaler INHALE TWO PUFFS INTO LUNGS EVERY 6 HOURS  AS NEEDED FOR WHEEZING 08/27/14  Yes Elsie Stain, MD  budesonide-formoterol [facility]) 160-4.5 MCG/ACT inhaler Inhale 2 puffs into the lungs 2 (two) times daily. 12/13/14  Yes Elsie Stain, MD  cetirizine (ZYRTEC) 10 MG tablet Take 10 mg by mouth daily.   Yes Historical Provider, MD  Cyanocobalamin (VITAMIN B-12 PO) Take by mouth daily.   Yes Historical Provider, MD  fluticasone (FLONASE) 50 MCG/ACT nasal spray Place 2 sprays into both nostrils 2 (two) times daily. 09/29/14  Yes Elsie Stain, MD  levothyroxine (SYNTHROID, LEVOTHROID) 125 MCG tablet Take 1 tablet (125 mcg total) by mouth daily. 08/21/13  Yes Deneise Lever, MD  losartan-hydrochlorothiazide [facility]) 50-12.5 MG per tablet TAKE 1  TABLET EVERY DAY 01/26/14  Yes Chipper Herb, MD  magnesium oxide (MAG-OX) 400 MG tablet Take 400 mg by mouth daily.   Yes Historical Provider, MD  meloxicam (MOBIC) 15 MG tablet Take 1 tablet (15 mg total) by mouth daily. 09/09/14  Yes Mary-Margaret Hassell Done, FNP  omeprazole (PRILOSEC) 40 MG capsule Take 1 capsule (40 mg total) by mouth 2 (two) times daily. 02/04/15  Yes Irene Shipper, MD  OVER THE COUNTER MEDICATION Take 2 tablets by mouth every evening. OTC leg cramp medicine   Yes Historical Provider, MD  PRESCRIPTION MEDICATION Oxygen 3.0 liters at night, prn use in day   Yes Historical Provider, MD  Respiratory Therapy Supplies (FLUTTER) DEVI Use three times daily 01/04/11  Yes Elsie Stain, MD  traMADol (ULTRAM) 50 MG tablet Take 1 tablet (50 mg total) by mouth 2 (two) times daily. 05/06/13  Yes Deneise Lever, MD   BP 113/72 mmHg  Pulse 73  Temp(Src) 97.3 F (36.3 C) (Oral)  Resp 16  SpO2 95% Physical Exam  Constitutional: He appears well-developed and well-nourished. No distress.  HENT:  Head: Normocephalic and atraumatic.  Eyes: Pupils are equal, round, and reactive to light.  Neck: Normal range of motion. Neck supple.  Cardiovascular: Normal rate and regular rhythm.   Pulmonary/Chest: Effort normal. He has no decreased breath sounds. He has no wheezes. He exhibits no tenderness and no bony tenderness.  Abdominal: Soft. Bowel sounds are normal. He exhibits no distension. There is no tenderness. There is no rigidity, no rebound, no guarding and no CVA tenderness.  Musculoskeletal:  No LE swelling  Neurological: He is alert.  Cranial nerves II-VIII and X-XII evaluated and show no deficits. Pt alert and oriented x 3 Upper and lower extremity strength is symmetrical and physiologic Normal muscular tone No facial droop Coordination intact, no limb ataxia, finger-nose-finger normal Rapid alternating movements normal No pronator drift  Skin: Skin is warm and dry.  Nursing note and  vitals reviewed.   ED Course  Procedures (including critical care time) Labs Review Labs Reviewed  CBC WITH DIFFERENTIAL/PLATELET - Abnormal; Notable for the following:    WBC 13.1 (*)    Neutro Abs 9.6 (*)    All other components within normal limits  BASIC METABOLIC PANEL - Abnormal; Notable for the following:    Chloride 99 (*)    Glucose, Bld 124 (*)    Creatinine, Ser 1.57 (*)    GFR calc non Af Amer 43 (*)    GFR calc Af Amer 50 (*)    All other components within normal limits  TROPONIN I  TROPONIN I  I-STAT TROPOININ, ED    Imaging Review Dg Chest 2 View  02/18/2015  CLINICAL DATA:  Dizziness and weakness EXAM: CHEST -  2 VIEW COMPARISON:  08/26/2014 FINDINGS: Cardiac shadow is stable. The lungs are well aerated bilaterally. Stable scarring is noted in the upper lobes bilaterally. No new focal infiltrate or sizable effusion is seen. Bullous changes are noted in the apices as well stable from the prior exam. No acute bony abnormality is noted. IMPRESSION: Chronic changes without acute abnormality. Electronically Signed   By: Inez Catalina M.D.   On: 02/18/2015 11:40   Ct Head Wo Contrast  02/18/2015  CLINICAL DATA:  Dizziness, nausea, chest pressure EXAM: CT HEAD WITHOUT CONTRAST TECHNIQUE: Contiguous axial images were obtained from the base of the skull through the vertex without intravenous contrast. COMPARISON:  None. FINDINGS: No skull fracture is noted. Paranasal sinuses and mastoid air cells are unremarkable. No intracranial hemorrhage, mass effect or midline shift. No acute cortical infarction. No mass lesion is noted on this unenhanced scan. Mild cerebral atrophy. Small lacunar infarct is noted in in left posterior cerebellum. IMPRESSION: No acute intracranial abnormality. Mild cerebral atrophy. Small lacunar infarct in left posterior cerebellum. Electronically Signed   By: Lahoma Crocker M.D.   On: 02/18/2015 12:49   I have personally reviewed and evaluated these images and  lab results as part of my medical decision-making.   EKG Interpretation   Date/Time:  Friday February 18 2015 10:41:20 EDT Ventricular Rate:  76 PR Interval:  154 QRS Duration: 89 QT Interval:  386 QTC Calculation: 434 R Axis:   41 Text Interpretation:  Sinus rhythm Posterior infarct, old Borderline  repolarization abnormality No old tracing to compare Confirmed by [facility]   MD, MARTHA [id number]) on 02/18/2015 1:15:02 PM      MDM   Final diagnoses:  Near syncope   HEART score of 2 ( age ) he has two risk factors of previous smoker and hypertension. The patient had a negative Head CT and chest xray. BMP, CBC, and first Troponin are normal. Normal EKG   His symptoms are very atypical for TIA/Stroke or Cardiac event but we discussed admitting for observation. The patient vehemently declines admittance. He really wants to go home. I discussed the case with Dr. Canary Brim and since he had a HEART score of 2 and very atypical symptoms we believe patient can be discharged without AMA with close follow-up with cardiology and PCP.  Medications - No data to display  69 y.o.Serina Cowper Candelas's medical screening exam was performed and I feel the patient has had an appropriate workup for their chief complaint at this time and likelihood of emergent condition existing is low. They have been counseled on decision, discharge, follow up and which symptoms necessitate immediate return to the emergency department. They or their family verbally stated understanding and agreement with plan and discharged in stable condition.   Vital signs are stable at discharge. Filed Vitals:   02/18/15 1445  BP: 113/72  Pulse: 73  Temp:   Resp: [id number] PA-C 02/18/15 1511  Alfonzo Beers, MD 02/18/15 [phone]

## 2015-02-18 NOTE — Progress Notes (Signed)
Subjective:  Patient ID: Antonio Bowen, male    DOB: [birth year]  Age: 69 y.o. MRN: [id number]  CC: Emesis   HPI Antonio Bowen presents for chest pain and emesis. The patient was going about his normal routine yesterday through the day and again this morning he got up and was going through his exercise routine. Chest easy was finishing it he started feeling very very dizzy and things started spinning. He began feeling very very hot and sweaty. He began to get nauseous and vomited. He then went outside feeling so hot he just had get outside to cool off. Outside he vomited several more times. Then he came back in and had one episode of diarrhea and vomited yet again. During this time he felt very dizzy and weak. He felt that things were moving around and his head was spinning. During this time he also had a sense of fullness in the lower chest and epigastric area. He does have a history of GI bleed. His wife gave him 2 baby aspirin due to this. He also had a severe generalized weakness that persists until this time. Additionally he was feeling a numbness and weakness in both arms. He does have use of his extremities and this weakness is nonfocal and bilateral.  Of note is that the patient had a heart cath between 5 and 10 years ago that was normal. He had had an abnormal EKG that led to that test being performed. He had been felt to be at high risk and it was done urgently.   Cardiac risk factors include tobacco dependence although he quit smoking approximately 5 years ago. His father had a stroke. He is treated for high blood pressure. Negatives include diabetes and hyperlipidemia.  History Antonio Bowen has a past medical history of Bronchitis; Tobacco dependence; COPD (chronic obstructive pulmonary disease) ([place]); Hypertension; GERD (gastroesophageal reflux disease); Allergic rhinitis; Low back pain; Arthritis; Colon polyps; Thyroid disease; Kidney stone; and BOOP (bronchiolitis obliterans with  organizing pneumonia) ([place]).   He has past surgical history that includes Other surgical history; Total hip arthroplasty (01/2011); carpel tunnal; Ulnar nerve transposition; Dupuytren contracture release; and Facial fracture surgery.   His family history includes Arthritis in his father; Cancer in his maternal grandfather and paternal grandmother; Colon cancer in an other family member; Prostate cancer in his maternal grandfather; Stomach cancer in his maternal grandmother; Stroke in his father.He reports that he quit smoking about 4 years ago. His smoking use included Cigarettes. He has a 50 pack-year smoking history. His smokeless tobacco use includes Snuff. He reports that he does not drink alcohol or use illicit drugs.  Outpatient Prescriptions Prior to Visit  Medication Sig Dispense Refill  . acetaminophen (TYLENOL) 500 MG tablet Take 500 mg by mouth every 6 (six) hours as needed.    Marland Kitchen albuterol (VENTOLIN HFA) 108 (90 BASE) MCG/ACT inhaler INHALE TWO PUFFS INTO LUNGS EVERY 6 HOURS AS NEEDED FOR WHEEZING 1 Inhaler 4  . budesonide-formoterol (SYMBICORT) 160-4.5 MCG/ACT inhaler Inhale 2 puffs into the lungs 2 (two) times daily. 1 Inhaler 6  . cetirizine (ZYRTEC) 10 MG tablet Take 10 mg by mouth daily.    . Cyanocobalamin (VITAMIN B-12 PO) Take by mouth daily.    . fluticasone (FLONASE) 50 MCG/ACT nasal spray Place 2 sprays into both nostrils 2 (two) times daily. 48 g 3  . levothyroxine (SYNTHROID, LEVOTHROID) 125 MCG tablet Take 1 tablet (125 mcg total) by mouth daily. 90 tablet 2  . losartan-hydrochlorothiazide (HYZAAR)  50-12.5 MG per tablet TAKE 1 TABLET EVERY DAY 90 tablet 0  . magnesium oxide (MAG-OX) 400 MG tablet Take 400 mg by mouth daily.    . meloxicam (MOBIC) 15 MG tablet Take 1 tablet (15 mg total) by mouth daily. 90 tablet 0  . omeprazole (PRILOSEC) 40 MG capsule Take 1 capsule (40 mg total) by mouth 2 (two) times daily. 180 capsule 1  . OVER THE COUNTER MEDICATION OTC leg cramp  medicine    . PRESCRIPTION MEDICATION Oxygen 3.0 liters at night, prn use in day    . Respiratory Therapy Supplies (FLUTTER) DEVI Use three times daily 1 each 0  . traMADol (ULTRAM) 50 MG tablet Take 1 tablet (50 mg total) by mouth 2 (two) times daily. 180 tablet 0   No facility-administered medications prior to visit.    ROS Review of Systems  Constitutional: Negative for fever, chills and diaphoresis.  HENT: Negative for congestion, rhinorrhea and sore throat.   Respiratory: Negative for cough, shortness of breath and wheezing.   Cardiovascular: Negative for chest pain.  Gastrointestinal: Negative for nausea, vomiting, abdominal pain, diarrhea, constipation and abdominal distention.  Genitourinary: Negative for dysuria and frequency.  Musculoskeletal: Negative for joint swelling and arthralgias.  Skin: Negative for rash.  Neurological: Negative for headaches.    Objective:  BP 138/81 mmHg  Pulse 73  Ht 5\' 9"  (1.753 m)  SpO2 96%  BP Readings from Last 3 Encounters:  02/18/15 138/81  08/26/14 100/69  01/14/14 122/74    Wt Readings from Last 3 Encounters:  08/26/14 211 lb (95.709 kg)  01/14/14 208 lb (94.348 kg)  08/06/13 212 lb (96.163 kg)     Physical Exam  Constitutional: He is oriented to person, place, and time. He appears well-developed and well-nourished. He appears distressed.  HENT:  Head: Normocephalic and atraumatic.  Right Ear: External ear normal.  Left Ear: External ear normal.  Nose: Nose normal.  Mouth/Throat: Oropharynx is clear and moist.  Eyes: Conjunctivae and EOM are normal. Pupils are equal, round, and reactive to light.  Neck: Normal range of motion. Neck supple. No thyromegaly present.  Cardiovascular: Normal rate, regular rhythm and normal heart sounds.   No murmur heard. Pulmonary/Chest: Effort normal and breath sounds normal. No respiratory distress. He has no wheezes. He has no rales.  Abdominal: Soft. Bowel sounds are normal. He  exhibits distension. He exhibits no mass. There is tenderness. There is no rebound and no guarding.  Musculoskeletal: Normal range of motion.  Lymphadenopathy:    He has no cervical adenopathy.  Neurological: He is alert and oriented to person, place, and time. He has normal reflexes.  Skin: Skin is warm. He is diaphoretic.  Psychiatric: His behavior is normal. Judgment and thought content normal.  Patient is restless and fidgety, but alert and oriented. He is slightly agitated but not inappropriately so due to the situation.    Lab Results  Component Value Date   HGBA1C 5.2% 05/06/2013    Lab Results  Component Value Date   WBC 11.5* 05/06/2013   HGB 17.7 05/06/2013   HCT 49.9 05/06/2013   PLT 182 05/06/2013   GLUCOSE 97 05/06/2013   CHOL 104 05/06/2013   TRIG 79 05/06/2013   HDL 54 05/06/2013   LDLCALC 34 05/06/2013   ALT 23 05/06/2013   AST 18 05/06/2013   NA 142 05/06/2013   K 4.1 05/06/2013   CL 100 05/06/2013   CREATININE 1.42* 05/06/2013   BUN 12 05/06/2013  CO2 24 05/06/2013   TSH 2.500 05/06/2013   INR 0.86 02/15/2011   HGBA1C 5.2% 05/06/2013    Dg Chest 2 View  08/26/2014  CLINICAL DATA:  History of asthma and COPD, chronic dyspnea, former smoker. EXAM: CHEST  2 VIEW COMPARISON:  PA and lateral chest x-ray of May 29, 2012 FINDINGS: The lungs are adequately inflated. The interstitial markings are mildly increased. There are areas of coarse airspace opacification in both lungs which are stable. There are bullous changes in the apices. There are no air bronchograms. There is no pleural effusion or pneumothorax. The heart and pulmonary vascularity are normal. The mediastinum is normal in width. The bony thorax exhibits no acute abnormality. IMPRESSION: COPD with bullous changes in the apices and stable pleural parenchymal scarring. There is no acute cardiopulmonary abnormality. Electronically Signed   By: David  Martinique M.D.   On: 08/26/2014 13:19    Assessment &  Plan:   Keeon was seen today for emesis.  Diagnoses and all orders for this visit:  Other chest pain -     EKG 12-Lead   I am having Mr. Hull maintain his FLUTTER, cetirizine, PRESCRIPTION MEDICATION, OVER THE COUNTER MEDICATION, acetaminophen, traMADol, magnesium oxide, Cyanocobalamin (VITAMIN B-12 PO), levothyroxine, losartan-hydrochlorothiazide, albuterol, meloxicam, fluticasone, budesonide-formoterol, and omeprazole.  No orders of the defined types were placed in this encounter.   EKG does not have any acute ischemic changes. However, based on the history of abnormal EKG in the past, the diaphoresis and chest pain and nausea I feel transport to the emergency department at [facility] to be essential. The rationale is that we cannot rule out cardiac origin for the symptoms even though there is potential that they are GI related. Additionally even if they are GI related his history of GI bleed makes evaluation in the emergency room that much more important to rule out possible recurrent bleed. Of note is that his emesis was not bloody so this is again somewhat less likely. However, the patient is in distress and all life-threatening potentials could not be ruled out in this office. Therefore EMS was contacted. Due to severe nausea he was given Zofran 8 mg sublingual. He had previously received aspirin 162 mg prior to arrival. This was not repeated due to his history of GI bleed and recurrent nausea and vomiting. However he was given a sublingual nitroglycerin and started on 3 L nasal cannula oxygen.  Follow-up in this office after release from [facility]  Follow-up: Return if symptoms worsen or fail to improve, for And after release from [facility]..  Claretta Fraise, M.D.

## 2015-02-18 NOTE — Discharge Instructions (Signed)

## 2015-02-18 NOTE — ED Notes (Signed)
PA at bedside.

## 2015-02-18 NOTE — ED Notes (Signed)
Patient transported to CT 

## 2015-02-21 ENCOUNTER — Ambulatory Visit (INDEPENDENT_AMBULATORY_CARE_PROVIDER_SITE_OTHER): Payer: Medicare HMO | Admitting: Family Medicine

## 2015-02-21 ENCOUNTER — Encounter: Payer: Self-pay | Admitting: Family Medicine

## 2015-02-21 VITALS — BP 137/90 | HR 76 | Temp 98.0°F | Ht 69.0 in | Wt 206.6 lb

## 2015-02-21 DIAGNOSIS — R55 Syncope and collapse: Secondary | ICD-10-CM

## 2015-02-21 DIAGNOSIS — Z23 Encounter for immunization: Secondary | ICD-10-CM | POA: Diagnosis not present

## 2015-02-21 DIAGNOSIS — H538 Other visual disturbances: Secondary | ICD-10-CM | POA: Diagnosis not present

## 2015-02-21 DIAGNOSIS — E039 Hypothyroidism, unspecified: Secondary | ICD-10-CM

## 2015-02-21 MED ORDER — ALBUTEROL SULFATE HFA 108 (90 BASE) MCG/ACT IN AERS: INHALATION_SPRAY | RESPIRATORY_TRACT | Status: DC

## 2015-02-21 MED ORDER — LEVOTHYROXINE SODIUM 125 MCG PO TABS: 125.0000 ug | ORAL_TABLET | Freq: Every day | ORAL | Status: DC

## 2015-02-21 MED ORDER — LOSARTAN POTASSIUM-HCTZ 50-12.5 MG PO TABS: 1.0000 | ORAL_TABLET | Freq: Every day | ORAL | Status: DC

## 2015-02-21 MED ORDER — FLUTICASONE PROPIONATE 50 MCG/ACT NA SUSP: 2.0000 | Freq: Two times a day (BID) | NASAL | Status: DC

## 2015-02-21 MED ORDER — OMEPRAZOLE 40 MG PO CPDR: 40.0000 mg | DELAYED_RELEASE_CAPSULE | Freq: Two times a day (BID) | ORAL | Status: DC

## 2015-02-21 MED ORDER — BUDESONIDE-FORMOTEROL FUMARATE 160-4.5 MCG/ACT IN AERO: 2.0000 | INHALATION_SPRAY | Freq: Two times a day (BID) | RESPIRATORY_TRACT | Status: DC

## 2015-02-21 MED ORDER — TRAMADOL HCL 50 MG PO TABS: 50.0000 mg | ORAL_TABLET | Freq: Two times a day (BID) | ORAL | Status: DC

## 2015-02-21 MED ORDER — MELOXICAM 15 MG PO TABS: 15.0000 mg | ORAL_TABLET | Freq: Every day | ORAL | Status: DC

## 2015-02-21 NOTE — Progress Notes (Signed)
BP 137/90 mmHg  Pulse 76  Temp(Src) 98 F (36.7 C) (Oral)  Ht 5\' 9"  (1.753 m)  Wt 206 lb 9.6 oz (93.713 kg)  BMI 30.50 kg/m2  SpO2 97%   Subjective:    Patient ID: Antonio Bowen, male    DOB: [birth year], 69 y.o.   MRN: [id number]  HPI: Antonio Bowen is a 69 y.o. male presenting on 02/21/2015 for ER Followup; Insomnia; Referral to eye doctor; and Refill all medications   HPI Near syncope Patient presents today for follow-up after an ER visit for near syncope. She was in the ED 3 days ago on 02/18/2015. Had an episode of feeling lightheaded and dizzy and felt like he is going to pass out. He has had vertigo and dizziness before but this was more persistent and felt more like lightheadedness and spinning sensation. He denies any chest pain, shortness of breath, weakness or numbness, seizures or tremors. He does admit to having some blurred vision associated with this episode but he has been having a blurred vision before this episode as well. His blood pressure was not low and tested normal for orthostatics in the ED.  Hypothyroidism Patient presents for a hypothyroid recheck because it is been well over 6 months since he had a checked. Currently taking levothyroxine. We'll also check because of the dizziness and syncope. No other signs of fatigue, hair changes, heat or cold issues, constipation or diarrhea.  Visual changes Patient feels like he has been having increased blurred vision that has gradually come up over the past few months. He has seen an eye doctor before but per him and his insurance requires that he get a referral.  Relevant past medical, surgical, family and social history reviewed and updated as indicated. Interim medical history since our last visit reviewed. Allergies and medications reviewed and updated.  Review of Systems  Constitutional: Negative for fever and chills.  HENT: Negative for congestion, ear discharge and ear pain.   Eyes: Positive for visual  disturbance (blurred vision). Negative for discharge.  Respiratory: Negative for shortness of breath and wheezing.   Cardiovascular: Negative for chest pain and leg swelling.  Gastrointestinal: Negative for abdominal pain, diarrhea and constipation.  Endocrine: Negative for cold intolerance and heat intolerance.  Genitourinary: Negative for difficulty urinating.  Musculoskeletal: Negative for back pain and gait problem.  Skin: Negative for rash.  Neurological: Positive for dizziness and light-headedness. Negative for syncope, weakness, numbness and headaches.  All other systems reviewed and are negative.   Per HPI unless specifically indicated above     Medication List       This list is accurate as of: 02/21/15 11:27 AM.  Always use your most recent med list.               acetaminophen 500 MG tablet  Commonly known as:  TYLENOL  Take 1,000 mg by mouth every 6 (six) hours as needed (pain).     albuterol 108 (90 BASE) MCG/ACT inhaler  Commonly known as:  VENTOLIN HFA  INHALE TWO PUFFS INTO LUNGS EVERY 6 HOURS AS NEEDED FOR WHEEZING     budesonide-formoterol 160-4.5 MCG/ACT inhaler  Commonly known as:  SYMBICORT  Inhale 2 puffs into the lungs 2 (two) times daily.     cetirizine 10 MG tablet  Commonly known as:  ZYRTEC  Take 10 mg by mouth daily.     fluticasone 50 MCG/ACT nasal spray  Commonly known as:  FLONASE  Place 2 sprays into  both nostrils 2 (two) times daily.     FLUTTER Devi  Use three times daily     levothyroxine 125 MCG tablet  Commonly known as:  SYNTHROID, LEVOTHROID  Take 1 tablet (125 mcg total) by mouth daily.     losartan-hydrochlorothiazide 50-12.5 MG tablet  Commonly known as:  HYZAAR  Take 1 tablet by mouth daily.     magnesium oxide 400 MG tablet  Commonly known as:  MAG-OX  Take 400 mg by mouth daily.     meloxicam 15 MG tablet  Commonly known as:  MOBIC  Take 1 tablet (15 mg total) by mouth daily.     omeprazole 40 MG capsule    Commonly known as:  PRILOSEC  Take 1 capsule (40 mg total) by mouth 2 (two) times daily.     OVER THE COUNTER MEDICATION  Take 2 tablets by mouth every evening. OTC leg cramp medicine     PRESCRIPTION MEDICATION  Oxygen 3.0 liters at night, prn use in day     traMADol 50 MG tablet  Commonly known as:  ULTRAM  Take 1 tablet (50 mg total) by mouth 2 (two) times daily.     VITAMIN B-12 PO  Take by mouth daily.           Objective:    BP 137/90 mmHg  Pulse 76  Temp(Src) 98 F (36.7 C) (Oral)  Ht 5\' 9"  (1.753 m)  Wt 206 lb 9.6 oz (93.713 kg)  BMI 30.50 kg/m2  SpO2 97%  Wt Readings from Last 3 Encounters:  02/21/15 206 lb 9.6 oz (93.713 kg)  08/26/14 211 lb (95.709 kg)  01/14/14 208 lb (94.348 kg)    Physical Exam  Constitutional: He is oriented to person, place, and time. He appears well-developed and well-nourished. No distress.  HENT:  Right Ear: External ear normal.  Left Ear: External ear normal.  Nose: Nose normal.  Mouth/Throat: Oropharynx is clear and moist. No oropharyngeal exudate.  Eyes: Conjunctivae and EOM are normal. Pupils are equal, round, and reactive to light. Right eye exhibits no discharge. No scleral icterus.  Neck: Neck supple. No thyromegaly present.  Cardiovascular: Normal rate, regular rhythm, normal heart sounds and intact distal pulses.   No murmur heard. Pulmonary/Chest: Effort normal and breath sounds normal. No respiratory distress. He has no wheezes.  Abdominal: He exhibits no distension.  Musculoskeletal: Normal range of motion. He exhibits no edema.  Lymphadenopathy:    He has no cervical adenopathy.  Neurological: He is alert and oriented to person, place, and time. He displays normal reflexes. No cranial nerve deficit. He exhibits normal muscle tone. Coordination normal.  Skin: Skin is warm and dry. No rash noted. He is not diaphoretic.  Psychiatric: He has a normal mood and affect. His behavior is normal.  Vitals  reviewed.   Results for orders placed or performed during the hospital encounter of 02/18/15  CBC with Differential/Platelet  Result Value Ref Range   WBC 13.1 (H) 4.0 - 10.5 K/uL   RBC 5.28 4.22 - 5.81 MIL/uL   Hemoglobin 16.9 13.0 - 17.0 g/dL   HCT 48.7 39.0 - 52.0 %   MCV 92.2 78.0 - 100.0 fL   MCH 32.0 26.0 - 34.0 pg   MCHC 34.7 30.0 - 36.0 g/dL   RDW 13.2 11.5 - 15.5 %   Platelets 156 150 - 400 K/uL   Neutrophils Relative % 73 %   Neutro Abs 9.6 (H) 1.7 - 7.7 K/uL   Lymphocytes  Relative 23 %   Lymphs Abs 3.0 0.7 - 4.0 K/uL   Monocytes Relative 4 %   Monocytes Absolute 0.5 0.1 - 1.0 K/uL   Eosinophils Relative 0 %   Eosinophils Absolute 0.0 0.0 - 0.7 K/uL   Basophils Relative 0 %   Basophils Absolute 0.0 0.0 - 0.1 K/uL  Basic metabolic panel  Result Value Ref Range   Sodium 135 135 - 145 mmol/L   Potassium 4.1 3.5 - 5.1 mmol/L   Chloride 99 (L) 101 - 111 mmol/L   CO2 27 22 - 32 mmol/L   Glucose, Bld 124 (H) 65 - 99 mg/dL   BUN 15 6 - 20 mg/dL   Creatinine, Ser 1.57 (H) 0.61 - 1.24 mg/dL   Calcium 9.4 8.9 - 10.3 mg/dL   GFR calc non Af Amer 43 (L) >60 mL/min   GFR calc Af Amer 50 (L) >60 mL/min   Anion gap 9 5 - 15  Troponin I  Result Value Ref Range   Troponin I <0.03 <0.031 ng/mL  Troponin I  Result Value Ref Range   Troponin I <0.03 <0.031 ng/mL  I-stat troponin, ED  Result Value Ref Range   Troponin i, poc 0.00 0.00 - 0.08 ng/mL   Comment 3              Assessment & Plan:       Problem List Items Addressed This Visit      Endocrine   Hypothyroidism - Primary (Chronic)   Relevant Medications   levothyroxine (SYNTHROID, LEVOTHROID) 125 MCG tablet    Other Visit Diagnoses    Near syncope        will do referral to see Cardiology    Relevant Medications    losartan-hydrochlorothiazide (HYZAAR) 50-12.5 MG tablet    Other Relevant Orders    Ambulatory referral to Cardiology    Vision blurred        loss of vision on right eye    Relevant Orders     Ambulatory referral to Ophthalmology        Follow up plan: Return in about 4 weeks (around 03/21/2015), or if symptoms worsen or fail to improve, for f/u COPD hypertension.  Caryl Pina, MD [facility] 02/21/2015, 11:27 AM

## 2015-02-24 ENCOUNTER — Encounter: Payer: Self-pay | Admitting: Cardiovascular Disease

## 2015-02-24 ENCOUNTER — Ambulatory Visit (INDEPENDENT_AMBULATORY_CARE_PROVIDER_SITE_OTHER): Payer: Commercial Managed Care - HMO

## 2015-02-24 ENCOUNTER — Ambulatory Visit (INDEPENDENT_AMBULATORY_CARE_PROVIDER_SITE_OTHER): Payer: Commercial Managed Care - HMO | Admitting: Cardiovascular Disease

## 2015-02-24 VITALS — BP 124/62 | HR 75 | Ht 69.0 in | Wt 207.1 lb

## 2015-02-24 DIAGNOSIS — R55 Syncope and collapse: Secondary | ICD-10-CM | POA: Diagnosis not present

## 2015-02-24 DIAGNOSIS — R072 Precordial pain: Secondary | ICD-10-CM | POA: Diagnosis not present

## 2015-02-24 NOTE — Progress Notes (Signed)
Chief Complaint  Patient presents with  . Chest Pain     History of Present Illness: 69 yo male with history of HTN, tobacco abuse, COPD, BOOP who is here today as a new patient for evaluation of a constellation of symptoms including dizziness, near syncope, flushing, fullness in chest, dyspnea. He was seen in [place] on 02/18/15 with c/o near syncope, chest pain, epigastric pain. He woke up and exercised and then turned on the shower. He became dizzy, was sweating and clammy. He was nauseous and had an episode of vomiting. Both arms were numb and tingling. He had "fullness" in his chest but no pain. He went to the primary care office. EMS was called and he was evaluated in the ED. Workup with negative troponin, EKG without ischemia changes. CT head with no acute intracranial abnormality but there was mild cerebral atrophy with a small lacunar infarct in left posterior cerebellum. He was discharged home from the ED. He had a cardiac cath in March 2002 at [facility] with normal coronary arteries. He stopped smoking in 2011. He has been very active. He has had no symptoms like this in the past. He remains unsteady on his feet over the last six days. No further chest fullness or pressure.   Primary Care Physician: Redge Gainer, MD   Past Medical History  Diagnosis Date  . Bronchitis   . Tobacco dependence   . COPD (chronic obstructive pulmonary disease) ([place])   . Hypertension   . GERD (gastroesophageal reflux disease)   . Allergic rhinitis   . Low back pain   . Arthritis   . Colon polyps   . Thyroid disease   . Kidney stone   . BOOP (bronchiolitis obliterans with organizing pneumonia) [facility])     Past Surgical History  Procedure Laterality Date  . Other surgical history      facial surgery  . Total hip arthroplasty  01/2011    Right hip, [facility]  . Carpel tunnal      left  . Ulnar nerve transposition      left  . Dupuytren contracture release      Left  .  Facial fracture surgery      Current Outpatient Prescriptions  Medication Sig Dispense Refill  . acetaminophen (TYLENOL) 500 MG tablet Take 1,000 mg by mouth every 6 (six) hours as needed (pain).     Marland Kitchen albuterol (VENTOLIN HFA) 108 (90 BASE) MCG/ACT inhaler INHALE TWO PUFFS INTO LUNGS EVERY 6 HOURS AS NEEDED FOR WHEEZING 2 Inhaler 4  . budesonide-formoterol (SYMBICORT) 160-4.5 MCG/ACT inhaler Inhale 2 puffs into the lungs 2 (two) times daily. 1 Inhaler 3  . cetirizine (ZYRTEC) 10 MG tablet Take 10 mg by mouth daily.    . Cyanocobalamin (VITAMIN B-12 PO) Take by mouth daily.    . fluticasone (FLONASE) 50 MCG/ACT nasal spray Place 2 sprays into both nostrils 2 (two) times daily. 48 g 3  . levothyroxine (SYNTHROID, LEVOTHROID) 125 MCG tablet Take 1 tablet (125 mcg total) by mouth daily. 90 tablet 2  . losartan-hydrochlorothiazide (HYZAAR) 50-12.5 MG tablet Take 1 tablet by mouth daily. 90 tablet 3  . magnesium oxide (MAG-OX) 400 MG tablet Take 400 mg by mouth daily.    . meloxicam (MOBIC) 15 MG tablet Take 1 tablet (15 mg total) by mouth daily. 90 tablet 3  . omeprazole (PRILOSEC) 40 MG capsule Take 1 capsule (40 mg total) by mouth 2 (two) times daily. 180 capsule 3  .  OVER THE COUNTER MEDICATION Take 2 tablets by mouth every evening. OTC leg cramp medicine    . PRESCRIPTION MEDICATION Oxygen 3.0 liters at night, prn use in day    . Respiratory Therapy Supplies (FLUTTER) DEVI Use three times daily 1 each 0  . traMADol (ULTRAM) 50 MG tablet Take 1 tablet (50 mg total) by mouth 2 (two) times daily. 180 tablet 0   No current facility-administered medications for this visit.    Allergies  Allergen Reactions  . Nuts Nausea And Vomiting    sweating  . Vicodin [Hydrocodone-Acetaminophen]     tachycardia  . Aspirin     Upset stomach, bleeding  . Other     Per patient, cannot take any narcotic. Nausea and vomiting  . Penicillins     Rash, hives    Social History   Social History  .  Marital Status: Married    Spouse Name: N/A  . Number of Children: 2  . Years of Education: N/A   Occupational History  . Retired     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, also worked in [place]  . Smoking status: Former Smoker -- 1.00 packs/day for 50 years    Types: Cigarettes    Quit date: 07/30/2010  . Smokeless tobacco: Current User    Types: Snuff     Comment: tobacco info given 06-27-12  . Alcohol Use: No  . Drug Use: No  . Sexual Activity: Not on file   Other Topics Concern  . Not on file   Social History Narrative    Family History  Problem Relation Age of Onset  . Arthritis Father   . Stroke Father   . Cancer Maternal Grandfather   . Prostate cancer Maternal Grandfather   . Stomach cancer Maternal Grandmother   . Cancer Paternal Grandmother   . Colon cancer      mat 1st cousin    Review of Systems:  As stated in the HPI and otherwise negative.   BP 124/62 mmHg  Pulse 75  Ht 5\' 9"  (1.753 m)  Wt 207 lb 1.9 oz (93.949 kg)  BMI 30.57 kg/m2  Physical Examination: General: Well developed, well nourished, NAD HEENT: OP clear, mucus membranes moist SKIN: warm, dry. No rashes. Neuro: No focal deficits Musculoskeletal: Muscle strength 5/5 all ext Psychiatric: Mood and affect normal Neck: No JVD, no carotid bruits, no thyromegaly, no lymphadenopathy. Lungs:Clear bilaterally, no wheezes, rhonci, crackles Cardiovascular: Regular rate and rhythm. No murmurs, gallops or rubs. Abdomen:Soft. Bowel sounds present. Non-tender.  Extremities: No lower extremity edema. Pulses are 2 + in the bilateral DP/PT.  EKG:  EKG is not ordered today. The ekg ordered today demonstrates  EKG from 02/18/15 with sinus, non-specific ST and T wave abnormalities.   Recent Labs: 02/18/2015: BUN 15; Creatinine, Ser 1.57*; Hemoglobin 16.9; Platelets 156; Potassium 4.1; Sodium 135   Lipid Panel    Component Value Date/Time   CHOL 104 05/06/2013 1130    TRIG 79 05/06/2013 1130   HDL 54 05/06/2013 1130   LDLCALC 34 05/06/2013 1130     Wt Readings from Last 3 Encounters:  02/24/15 207 lb 1.9 oz (93.949 kg)  02/21/15 206 lb 9.6 oz (93.713 kg)  08/26/14 211 lb (95.709 kg)     Other studies Reviewed: Additional studies/ records that were reviewed today include: . Review of the above records demonstrates:    Assessment and Plan:   1. Chest pain: He does not  have typical features c/w unstable angina. His symptoms are mostly of dizziness and unsteadiness on his feet. His event 6 days ago could have been neurological. Will arrange echo to assess LV function and exclude structural heart disease. I do not think stress testing is indicated at this time. No CAD by cath 2002.   2. Dizziness/near syncope: He had an ectopic beat on my exam. Will get a 48 hour monitor to exclude arrhythmias. He has an old CVA noted on CT head. Possibility of arrhythmia leading to this. Will consider longer term monitor if workup is negative. Will arrange carotid dopplers.   Current medicines are reviewed at length with the patient today.  The patient does not have concerns regarding medicines.  The following changes have been made:  no change  Labs/ tests ordered today include:   Orders Placed This Encounter  Procedures  . Holter monitor - 48 hour  . Echocardiogram    Disposition:   FU with me in 3 weeks  Signed, Lauree Chandler, MD 02/24/2015 4:49 PM    [facility] [address], [place], [place]  [address] Phone: [phone]; Fax: [phone]

## 2015-02-24 NOTE — Patient Instructions (Signed)
Medication Instructions:  Your physician recommends that you continue on your current medications as directed. Please refer to the Current Medication list given to you today.   Labwork: none  Testing/Procedures: Your physician has requested that you have a carotid duplex. This test is an ultrasound of the carotid arteries in your neck. It looks at blood flow through these arteries that supply the brain with blood. Allow one hour for this exam. There are no restrictions or special instructions.  Your physician has requested that you have an echocardiogram. Echocardiography is a painless test that uses sound waves to create images of your heart. It provides your doctor with information about the size and shape of your heart and how well your heart's chambers and valves are working. This procedure takes approximately one hour. There are no restrictions for this procedure.  Your physician has recommended that you wear a holter monitor. Holter monitors are medical devices that record the heart's electrical activity. Doctors most often use these monitors to diagnose arrhythmias. Arrhythmias are problems with the speed or rhythm of the heartbeat. The monitor is a small, portable device. You can wear one while you do your normal daily activities. This is usually used to diagnose what is causing palpitations/syncope (passing out).    Follow-Up: Your physician recommends that you schedule a follow-up appointment in: about 3 weeks.  We will call you to schedule this appointment.   Any Other Special Instructions Will Be Listed Below (If Applicable).     If you need a refill on your cardiac medications before your next appointment, please call your pharmacy.

## 2015-02-28 ENCOUNTER — Ambulatory Visit (HOSPITAL_BASED_OUTPATIENT_CLINIC_OR_DEPARTMENT_OTHER): Payer: Commercial Managed Care - HMO

## 2015-02-28 ENCOUNTER — Ambulatory Visit (HOSPITAL_COMMUNITY)
Admission: RE | Admit: 2015-02-28 | Discharge: 2015-02-28 | Disposition: A | Discharge status: Home / Self Care | Payer: Commercial Managed Care - HMO | Source: Ambulatory Visit | Attending: Cardiology | Admitting: Cardiology

## 2015-02-28 ENCOUNTER — Other Ambulatory Visit: Payer: Self-pay

## 2015-02-28 DIAGNOSIS — E669 Obesity, unspecified: Secondary | ICD-10-CM | POA: Diagnosis not present

## 2015-02-28 DIAGNOSIS — R079 Chest pain, unspecified: Secondary | ICD-10-CM | POA: Diagnosis not present

## 2015-02-28 DIAGNOSIS — I6523 Occlusion and stenosis of bilateral carotid arteries: Secondary | ICD-10-CM | POA: Diagnosis not present

## 2015-02-28 DIAGNOSIS — I517 Cardiomegaly: Secondary | ICD-10-CM | POA: Diagnosis not present

## 2015-02-28 DIAGNOSIS — R55 Syncope and collapse: Secondary | ICD-10-CM

## 2015-02-28 DIAGNOSIS — I5189 Other ill-defined heart diseases: Secondary | ICD-10-CM | POA: Diagnosis not present

## 2015-02-28 DIAGNOSIS — F17200 Nicotine dependence, unspecified, uncomplicated: Secondary | ICD-10-CM | POA: Diagnosis not present

## 2015-02-28 DIAGNOSIS — I10 Essential (primary) hypertension: Secondary | ICD-10-CM | POA: Diagnosis not present

## 2015-02-28 DIAGNOSIS — Z6830 Body mass index (BMI) 30.0-30.9, adult: Secondary | ICD-10-CM | POA: Insufficient documentation

## 2015-02-28 DIAGNOSIS — R072 Precordial pain: Secondary | ICD-10-CM | POA: Diagnosis not present

## 2015-03-02 DIAGNOSIS — H2513 Age-related nuclear cataract, bilateral: Secondary | ICD-10-CM | POA: Diagnosis not present

## 2015-03-03 ENCOUNTER — Telehealth: Payer: Self-pay | Admitting: *Deleted

## 2015-03-03 MED ORDER — DILTIAZEM HCL ER COATED BEADS 120 MG PO CP24: 120.0000 mg | ORAL_CAPSULE | Freq: Every day | ORAL | Status: DC

## 2015-03-03 NOTE — Telephone Encounter (Signed)
Patient informed and agrees with plan

## 2015-03-03 NOTE — Telephone Encounter (Signed)
-----   Message from Burnell Blanks, MD sent at 03/03/2015 10:58 AM EDT ----- Pt with frequent PACs and rare PVCs. I would like to start Cardizem CD 120 mg daily if he is willing. Thanks, chris

## 2015-03-08 IMAGING — NM NM BONE 3 PHASE
2 series · 12 of 12 positions shown · non-contrast
Comparison: Pelvic radiographs 02/23/2011

RADIOPHARMACEUTICALS:  25.3 mCi Technetium 99 MDP

CLINICAL DATA: Right hip pain following multiple recent falls.
History of right hip replacement in January 2011.

EXAM:
NUCLEAR MEDICINE 3-PHASE BONE SCAN
TECHNIQUE: Radionuclide angiographic images, immediate static blood pool
images, and 3-hour delayed static images were obtained of the pelvis
and both hips after intravenous injection of radiopharmaceutical.

[Series 1: fl flow and static · 4.75mm/px · 6 of 38 frames shown (1 of 2)]
[frame 4/38  full-range]
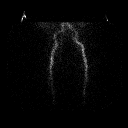
[frame 10/38  full-range]
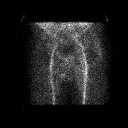
[frame 16/38  full-range]
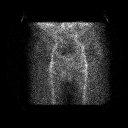
[frame 23/38  full-range]
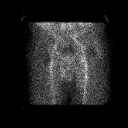
[frame 29/38  full-range]
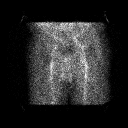
[frame 35/38  full-range]
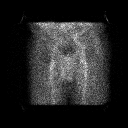

[Series 1: fl flow and static · 4.75mm/px · 6 of 40 frames shown (2 of 2)]
[frame 4/40]
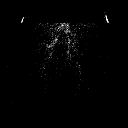
[frame 10/40  full-range]
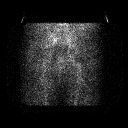
[frame 17/40  full-range]
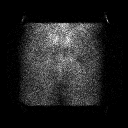
[frame 24/40  full-range]
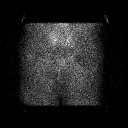
[frame 30/40  full-range]
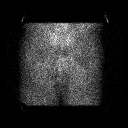
[frame 37/40  full-range]
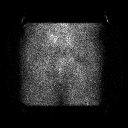

[12 of 12 positions shown; findings below may reference images not displayed]

FINDINGS: Vascular phase: Normal symmetric femoral artery activity
bilaterally.

Blood pool phase: Normal symmetric activity bilaterally. Photopenic
defect related to the right total hip arthroplasty noted.

Delayed phase: Expected activity related to prior right total hip
arthroplasty. No abnormally increased activity to suggest fracture,
loosening or infection.
IMPRESSION: Normal 3 phase bone scan following right total hip arthroplasty. No
evidence of fracture or prosthetic loosening.

## 2015-03-09 ENCOUNTER — Telehealth: Payer: Self-pay | Admitting: *Deleted

## 2015-03-09 DIAGNOSIS — H2513 Age-related nuclear cataract, bilateral: Secondary | ICD-10-CM | POA: Diagnosis not present

## 2015-03-09 NOTE — Telephone Encounter (Signed)
Received request for surgical clearance for cataract surgery. Surgery scheduled for 11/17.  I spoke with Freda Munro at [facility] and surgery is elective.  I told her pt has only been seen here one time and has follow up on 11/21 and we would prefer he be seen prior to clearing.  I spoke with pt and gave him this information.  He will keep scheduled appt on 11/21 and contact [facility] to reschedule surgery.

## 2015-03-13 DIAGNOSIS — J449 Chronic obstructive pulmonary disease, unspecified: Secondary | ICD-10-CM | POA: Diagnosis not present

## 2015-03-21 ENCOUNTER — Ambulatory Visit (INDEPENDENT_AMBULATORY_CARE_PROVIDER_SITE_OTHER): Payer: Commercial Managed Care - HMO | Admitting: Cardiovascular Disease

## 2015-03-21 ENCOUNTER — Encounter: Payer: Self-pay | Admitting: Cardiovascular Disease

## 2015-03-21 VITALS — BP 108/70 | HR 72 | Ht 69.0 in | Wt 213.0 lb

## 2015-03-21 DIAGNOSIS — I779 Disorder of arteries and arterioles, unspecified: Secondary | ICD-10-CM | POA: Diagnosis not present

## 2015-03-21 DIAGNOSIS — I739 Peripheral vascular disease, unspecified: Secondary | ICD-10-CM

## 2015-03-21 DIAGNOSIS — I491 Atrial premature depolarization: Secondary | ICD-10-CM | POA: Diagnosis not present

## 2015-03-21 DIAGNOSIS — R55 Syncope and collapse: Secondary | ICD-10-CM | POA: Diagnosis not present

## 2015-03-21 DIAGNOSIS — R072 Precordial pain: Secondary | ICD-10-CM | POA: Diagnosis not present

## 2015-03-21 MED ORDER — DILTIAZEM HCL ER COATED BEADS 120 MG PO CP24: 120.0000 mg | ORAL_CAPSULE | Freq: Every day | ORAL | Status: DC | PRN

## 2015-03-21 NOTE — Patient Instructions (Addendum)
Medication Instructions:  Your physician has recommended you make the following change in your medication: Hold Cardizem for now.  May use as needed.     Labwork: none  Testing/Procedures: none  Follow-Up: Your physician wants you to follow-up in: 6 months.  You will receive a reminder letter in the mail two months in advance. If you don't receive a letter, please call our office to schedule the follow-up appointment.   Any Other Special Instructions Will Be Listed Below (If Applicable).     If you need a refill on your cardiac medications before your next appointment, please call your pharmacy.

## 2015-03-21 NOTE — Progress Notes (Signed)
Chief Complaint  Patient presents with  . Follow-up    monitor results / pt c/o dizziness     History of Present Illness: 69 yo male with history of HTN, tobacco abuse, COPD, BOOP who is here today for cardiac follow up. I saw him as a new patient for evaluation of a constellation of symptoms including dizziness, near syncope, flushing, fullness in chest, dyspnea 02/24/15. He was seen in [place] on 02/18/15 with c/o near syncope, chest pain, epigastric pain. He woke up and exercised and then turned on the shower. He became dizzy, was sweating and clammy. He was nauseous and had an episode of vomiting. Both arms were numb and tingling. He had "fullness" in his chest but no pain. He went to the primary care office. EMS was called and he was evaluated in the ED. Workup with negative troponin, EKG without ischemia changes. CT head with no acute intracranial abnormality but there was mild cerebral atrophy with a small lacunar infarct in left posterior cerebellum. He was discharged home from the ED. He had a cardiac cath in March 2002 at [facility] with normal coronary arteries. He stopped smoking in 2011. I arranged an echo which showed normal LV function and no significant valve disease. 48 hour monitor with PACs, PVCs. Carotid artery dopplers with mild disease.   He is here today for follow up. He reports some dizziness since starting Cardizem but no near syncope or syncope. No chest pain or SOB.   Primary Care Physician: Redge Gainer, MD   Past Medical History  Diagnosis Date  . Bronchitis   . Tobacco dependence   . COPD (chronic obstructive pulmonary disease) ([place])   . Hypertension   . GERD (gastroesophageal reflux disease)   . Allergic rhinitis   . Low back pain   . Arthritis   . Colon polyps   . Thyroid disease   . Kidney stone   . BOOP (bronchiolitis obliterans with organizing pneumonia) [facility])     Past Surgical History  Procedure Laterality Date  . Other  surgical history      facial surgery  . Total hip arthroplasty  01/2011    Right hip, [facility]  . Carpel tunnal      left  . Ulnar nerve transposition      left  . Dupuytren contracture release      Left  . Facial fracture surgery      Current Outpatient Prescriptions  Medication Sig Dispense Refill  . acetaminophen (TYLENOL) 500 MG tablet Take 1,000 mg by mouth every 6 (six) hours as needed (pain).     Marland Kitchen albuterol (VENTOLIN HFA) 108 (90 BASE) MCG/ACT inhaler INHALE TWO PUFFS INTO LUNGS EVERY 6 HOURS AS NEEDED FOR WHEEZING 2 Inhaler 4  . budesonide-formoterol (SYMBICORT) 160-4.5 MCG/ACT inhaler Inhale 2 puffs into the lungs 2 (two) times daily. 1 Inhaler 3  . cetirizine (ZYRTEC) 10 MG tablet Take 10 mg by mouth daily.    . Cyanocobalamin (VITAMIN B-12 PO) Take 1 tablet by mouth daily.     Marland Kitchen diltiazem (CARDIZEM CD) 120 MG 24 hr capsule Take 1 capsule (120 mg total) by mouth daily as needed. 30 capsule 2  . fluticasone (FLONASE) 50 MCG/ACT nasal spray Place 2 sprays into both nostrils 2 (two) times daily. 48 g 3  . levothyroxine (SYNTHROID, LEVOTHROID) 125 MCG tablet Take 1 tablet (125 mcg total) by mouth daily. 90 tablet 2  . losartan-hydrochlorothiazide (HYZAAR) 50-12.5 MG tablet Take 1 tablet  by mouth daily. 90 tablet 3  . magnesium oxide (MAG-OX) 400 MG tablet Take 400 mg by mouth daily.    . meloxicam (MOBIC) 15 MG tablet Take 1 tablet (15 mg total) by mouth daily. 90 tablet 3  . omeprazole (PRILOSEC) 40 MG capsule Take 1 capsule (40 mg total) by mouth 2 (two) times daily. 180 capsule 3  . OVER THE COUNTER MEDICATION Take 2 tablets by mouth every evening. OTC leg cramp medicine    . PRESCRIPTION MEDICATION Oxygen 3.0 liters at night, prn use in day    . Respiratory Therapy Supplies (FLUTTER) DEVI Use three times daily 1 each 0  . traMADol (ULTRAM) 50 MG tablet Take 1 tablet (50 mg total) by mouth 2 (two) times daily. 180 tablet 0   No current facility-administered medications for  this visit.    Allergies  Allergen Reactions  . Nuts Nausea And Vomiting    sweating  . Vicodin [Hydrocodone-Acetaminophen]     tachycardia  . Penicillins     Rash, hives  . Aspirin Other (See Comments)    Upset stomach, bleeding  . Other Nausea And Vomiting    Per patient, cannot take any narcotic. Nausea and vomiting    Social History   Social History  . Marital Status: Married    Spouse Name: N/A  . Number of Children: 2  . Years of Education: N/A   Occupational History  . 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 Smoking status: Former Smoker -- 1.00 packs/day for 50 years    Types: Cigarettes    Quit date: 07/30/2010  . Smokeless tobacco: Current User    Types: Snuff     Comment: tobacco info given 06-27-12  . Alcohol Use: No  . Drug Use: No  . Sexual Activity: Not on file   Other Topics Concern  . Not on file   Social History Narrative    Family History  Problem Relation Age of Onset  . Arthritis Father   . Stroke Father   . Cancer Maternal Grandfather   . Prostate cancer Maternal Grandfather   . Stomach cancer Maternal Grandmother   . Cancer Paternal Grandmother   . Colon cancer      mat 1st cousin    Review of Systems:  As stated in the HPI and otherwise negative.   BP 108/70 mmHg  Pulse 72  Ht 5\' 9"  (1.753 m)  Wt 213 lb (96.616 kg)  BMI 31.44 kg/m2  SpO2 95%  Physical Examination: General: Well developed, well nourished, NAD HEENT: OP clear, mucus membranes moist SKIN: warm, dry. No rashes. Neuro: No focal deficits Musculoskeletal: Muscle strength 5/5 all ext Psychiatric: Mood and affect normal Neck: No JVD, no carotid bruits, no thyromegaly, no lymphadenopathy. Lungs:Clear bilaterally, no wheezes, rhonci, crackles Cardiovascular: Regular rate and rhythm. No murmurs, gallops or rubs. Abdomen:Soft. Bowel sounds present. Non-tender.  Extremities: No lower extremity  edema. Pulses are 2 + in the bilateral DP/PT.  Echo 02/28/15: Left ventricle: The cavity size was normal. Wall thickness was increased in a pattern of mild LVH. Systolic function was normal. The estimated ejection fraction was in the range of 60% to 65%. Wall motion was normal; there were no regional wall motion abnormalities. Doppler parameters are consistent with abnormal left ventricular relaxation (grade 1 diastolic dysfunction). The E/e&' ratio is <8, suggesting normal LV filling pressure. - Left atrium: The atrium  was normal in size. - Tricuspid valve: There was no significant regurgitation. - Inferior vena cava: The vessel was normal in size. The respirophasic diameter changes were in the normal range (= 50%), consistent with normal central venous pressure.  Impressions:  - LVEF 60-65%, mild LVH, normal wall motion, diastolic dysfunction, normal LV filling pressure, normal LA size.  EKG:  EKG is not ordered today. The ekg ordered today demonstrates  EKG from 02/18/15 with sinus, non-specific ST and T wave abnormalities.   Recent Labs: 02/18/2015: BUN 15; Creatinine, Ser 1.57*; Hemoglobin 16.9; Platelets 156; Potassium 4.1; Sodium 135   Lipid Panel    Component Value Date/Time   CHOL 104 05/06/2013 1130   TRIG 79 05/06/2013 1130   HDL 54 05/06/2013 1130   LDLCALC 34 05/06/2013 1130     Wt Readings from Last 3 Encounters:  03/21/15 213 lb (96.616 kg)  02/24/15 207 lb 1.9 oz (93.949 kg)  02/21/15 206 lb 9.6 oz (93.713 kg)     Other studies Reviewed: Additional studies/ records that were reviewed today include: . Review of the above records demonstrates:    Assessment and Plan:   1. Chest pain/Near syncope: He does not have typical features c/w unstable angina. His symptoms are mostly of dizziness and unsteadiness on his feet which is now mostly resolved. His event may have been neurological but he did have frequent PACs on holter monitor. Echo  with normal LV function. No CAD by cath 2002.   2. PACs: No evidence of atrial fibrillation. He reports hypotension and dizziness since starting the Cardizem. Will hold Cardizem for now and use prn. Avoid stimulants.   3. Carotid artery disease:  1-39% bilateral ICA stenosis. Repeat in 2 years.   Current medicines are reviewed at length with the patient today.  The patient does not have concerns regarding medicines.  The following changes have been made:  no change  Labs/ tests ordered today include:   No orders of the defined types were placed in this encounter.    Disposition:   FU with me in 3 weeks  Signed, Lauree Chandler, MD 03/23/2015 7:30 AM    [facility], [place], [place]  [address] Phone: [phone]; Fax: [phone]

## 2015-03-28 ENCOUNTER — Encounter: Payer: Self-pay | Admitting: Family Medicine

## 2015-03-28 ENCOUNTER — Ambulatory Visit (INDEPENDENT_AMBULATORY_CARE_PROVIDER_SITE_OTHER): Payer: Commercial Managed Care - HMO | Admitting: Family Medicine

## 2015-03-28 VITALS — BP 126/85 | HR 86 | Temp 98.5°F | Ht 69.0 in | Wt 214.0 lb

## 2015-03-28 DIAGNOSIS — I10 Essential (primary) hypertension: Secondary | ICD-10-CM | POA: Diagnosis not present

## 2015-03-28 DIAGNOSIS — J441 Chronic obstructive pulmonary disease with (acute) exacerbation: Secondary | ICD-10-CM | POA: Diagnosis not present

## 2015-03-28 MED ORDER — BUDESONIDE-FORMOTEROL FUMARATE 160-4.5 MCG/ACT IN AERO: 2.0000 | INHALATION_SPRAY | Freq: Two times a day (BID) | RESPIRATORY_TRACT | Status: DC

## 2015-03-28 NOTE — Progress Notes (Signed)
BP 126/85 mmHg  Pulse 86  Temp(Src) 98.5 F (36.9 C) (Oral)  Ht 5\' 9"  (1.753 m)  Wt 214 lb (97.07 kg)  BMI 31.59 kg/m2   Subjective:    Patient ID: Antonio Bowen, male    DOB: 1945/09/11, 69 y.o.   MRN: [id number]  HPI: Antonio Bowen is a 69 y.o. male presenting on 03/28/2015 for Hypertension   HPI Hypertension Patient is here for a hypertension recheck. He is currently taking losartan hydrochlorothiazide and blood pressure today is 126/85. Patient denies headaches, blurred vision, chest pains, shortness of breath, or weakness. Denies any side effects from medication and is content with current medication.  COPD recheck Patient is also coming in today for a COPD recheck. He is using his Symbicort and his albuterol. He says that he uses his albuterol inhaler 1-2 times per month while he is on his Symbicort. The Symbicort is doing pretty well for most of the time. He denies any fevers or chills. He denies any productive cough. He does have his "smoker's cough". He feels like his breathing is baseline where it has been and does not feel like it's going to get better at this point. He is able to walk up and down stairs and walk a couple of blocks but then gets winded if he goes in front of that.  Relevant past medical, surgical, family and social history reviewed and updated as indicated. Interim medical history since our last visit reviewed. Allergies and medications reviewed and updated.  Review of Systems  Constitutional: Negative for fever and chills.  HENT: Negative for congestion, ear discharge and ear pain.   Eyes: Negative for discharge and visual disturbance.  Respiratory: Positive for cough, shortness of breath (on exertion) and wheezing. Negative for chest tightness.   Cardiovascular: Negative for chest pain and leg swelling.  Gastrointestinal: Negative for abdominal pain, diarrhea and constipation.  Genitourinary: Negative for difficulty urinating.  Musculoskeletal:  Negative for back pain and gait problem.  Skin: Negative for rash.  Neurological: Negative for syncope, light-headedness and headaches.  All other systems reviewed and are negative.   Per HPI unless specifically indicated above     Medication List       This list is accurate as of: 03/28/15  5:08 PM.  Always use your most recent med list.               acetaminophen 500 MG tablet  Commonly known as:  TYLENOL  Take 1,000 mg by mouth every 6 (six) hours as needed (pain).     albuterol 108 (90 BASE) MCG/ACT inhaler  Commonly known as:  VENTOLIN HFA  INHALE TWO PUFFS INTO LUNGS EVERY 6 HOURS AS NEEDED FOR WHEEZING     budesonide-formoterol 160-4.5 MCG/ACT inhaler  Commonly known as:  SYMBICORT  Inhale 2 puffs into the lungs 2 (two) times daily.     cetirizine 10 MG tablet  Commonly known as:  ZYRTEC  Take 10 mg by mouth daily.     fluticasone 50 MCG/ACT nasal spray  Commonly known as:  FLONASE  Place 2 sprays into both nostrils 2 (two) times daily.     FLUTTER Devi  Use three times daily     levothyroxine 125 MCG tablet  Commonly known as:  SYNTHROID, LEVOTHROID  Take 1 tablet (125 mcg total) by mouth daily.     losartan-hydrochlorothiazide 50-12.5 MG tablet  Commonly known as:  HYZAAR  Take 1 tablet by mouth daily.  magnesium oxide 400 MG tablet  Commonly known as:  MAG-OX  Take 400 mg by mouth daily.     meloxicam 15 MG tablet  Commonly known as:  MOBIC  Take 1 tablet (15 mg total) by mouth daily.     omeprazole 40 MG capsule  Commonly known as:  PRILOSEC  Take 1 capsule (40 mg total) by mouth 2 (two) times daily.     OVER THE COUNTER MEDICATION  Take 2 tablets by mouth every evening. OTC leg cramp medicine     PRESCRIPTION MEDICATION  Oxygen 3.0 liters at night, prn use in day     traMADol 50 MG tablet  Commonly known as:  ULTRAM  Take 1 tablet (50 mg total) by mouth 2 (two) times daily.     VITAMIN B-12 PO  Take 1 tablet by mouth daily.             Objective:    BP 126/85 mmHg  Pulse 86  Temp(Src) 98.5 F (36.9 C) (Oral)  Ht 5\' 9"  (1.753 m)  Wt 214 lb (97.07 kg)  BMI 31.59 kg/m2  Wt Readings from Last 3 Encounters:  03/28/15 214 lb (97.07 kg)  03/21/15 213 lb (96.616 kg)  02/24/15 207 lb 1.9 oz (93.949 kg)    Physical Exam  Constitutional: He is oriented to person, place, and time. He appears well-developed and well-nourished. No distress.  Eyes: Conjunctivae and EOM are normal. Pupils are equal, round, and reactive to light. Right eye exhibits no discharge. No scleral icterus.  Neck: Neck supple. No thyromegaly present.  Cardiovascular: Normal rate, regular rhythm, normal heart sounds and intact distal pulses.   No murmur heard. Pulmonary/Chest: Effort normal and breath sounds normal. No respiratory distress. He has no wheezes (No audible wheezes today). He has no rales.  Musculoskeletal: Normal range of motion. He exhibits no edema.  Lymphadenopathy:    He has no cervical adenopathy.  Neurological: He is alert and oriented to person, place, and time. Coordination normal.  Skin: Skin is warm and dry. No rash noted. He is not diaphoretic.  Psychiatric: He has a normal mood and affect. His behavior is normal.  Vitals reviewed.   Results for orders placed or performed during the hospital encounter of 02/18/15  CBC with Differential/Platelet  Result Value Ref Range   WBC 13.1 (H) 4.0 - 10.5 K/uL   RBC 5.28 4.22 - 5.81 MIL/uL   Hemoglobin 16.9 13.0 - 17.0 g/dL   HCT 48.7 39.0 - 52.0 %   MCV 92.2 78.0 - 100.0 fL   MCH 32.0 26.0 - 34.0 pg   MCHC 34.7 30.0 - 36.0 g/dL   RDW 13.2 11.5 - 15.5 %   Platelets 156 150 - 400 K/uL   Neutrophils Relative % 73 %   Neutro Abs 9.6 (H) 1.7 - 7.7 K/uL   Lymphocytes Relative 23 %   Lymphs Abs 3.0 0.7 - 4.0 K/uL   Monocytes Relative 4 %   Monocytes Absolute 0.5 0.1 - 1.0 K/uL   Eosinophils Relative 0 %   Eosinophils Absolute 0.0 0.0 - 0.7 K/uL   Basophils Relative 0 %    Basophils Absolute 0.0 0.0 - 0.1 K/uL  Basic metabolic panel  Result Value Ref Range   Sodium 135 135 - 145 mmol/L   Potassium 4.1 3.5 - 5.1 mmol/L   Chloride 99 (L) 101 - 111 mmol/L   CO2 27 22 - 32 mmol/L   Glucose, Bld 124 (H) 65 - 99 mg/dL  BUN 15 6 - 20 mg/dL   Creatinine, Ser 1.57 (H) 0.61 - 1.24 mg/dL   Calcium 9.4 8.9 - 10.3 mg/dL   GFR calc non Af Amer 43 (L) >60 mL/min   GFR calc Af Amer 50 (L) >60 mL/min   Anion gap 9 5 - 15  Troponin I  Result Value Ref Range   Troponin I <0.03 <0.031 ng/mL  Troponin I  Result Value Ref Range   Troponin I <0.03 <0.031 ng/mL  I-stat troponin, ED  Result Value Ref Range   Troponin i, poc 0.00 0.00 - 0.08 ng/mL   Comment 3              Assessment & Plan:   Problem List Items Addressed This Visit      Cardiovascular and Mediastinum   Hypertension - Primary    Controlled today continue current medications.        Respiratory   Obstructive chronic bronchitis with exacerbation Gold B Copd    Mild COPD, stable on current moment, refill Symbicort.      Relevant Medications   budesonide-formoterol (SYMBICORT) 160-4.5 MCG/ACT inhaler       Follow up plan: Return in about 2 months (around 05/28/2015), or if symptoms worsen or fail to improve, for htn, hld, copd.  Counseling provided for all of the vaccine components No orders of the defined types were placed in this encounter.    Caryl Pina, MD [facility] 03/28/2015, 5:08 PM

## 2015-03-30 NOTE — Assessment & Plan Note (Signed)
Mild COPD, stable on current moment, refill Symbicort.

## 2015-03-30 NOTE — Assessment & Plan Note (Signed)
Controlled today continue current medications.

## 2015-03-31 DIAGNOSIS — H2511 Age-related nuclear cataract, right eye: Secondary | ICD-10-CM | POA: Diagnosis not present

## 2015-03-31 HISTORY — PX: CATARACT EXTRACTION: SUR2

## 2015-04-12 DIAGNOSIS — J449 Chronic obstructive pulmonary disease, unspecified: Secondary | ICD-10-CM | POA: Diagnosis not present

## 2015-05-13 DIAGNOSIS — J449 Chronic obstructive pulmonary disease, unspecified: Secondary | ICD-10-CM | POA: Diagnosis not present

## 2015-06-01 ENCOUNTER — Encounter: Payer: Self-pay | Admitting: Family Medicine

## 2015-06-01 ENCOUNTER — Other Ambulatory Visit: Payer: Self-pay | Admitting: Family Medicine

## 2015-06-01 ENCOUNTER — Ambulatory Visit (INDEPENDENT_AMBULATORY_CARE_PROVIDER_SITE_OTHER): Payer: Commercial Managed Care - HMO | Admitting: Family Medicine

## 2015-06-01 VITALS — BP 131/82 | HR 73 | Temp 98.8°F | Ht 69.0 in | Wt 213.8 lb

## 2015-06-01 DIAGNOSIS — I10 Essential (primary) hypertension: Secondary | ICD-10-CM

## 2015-06-01 DIAGNOSIS — E039 Hypothyroidism, unspecified: Secondary | ICD-10-CM

## 2015-06-01 DIAGNOSIS — E785 Hyperlipidemia, unspecified: Secondary | ICD-10-CM | POA: Diagnosis not present

## 2015-06-01 DIAGNOSIS — J8489 Other specified interstitial pulmonary diseases: Secondary | ICD-10-CM | POA: Diagnosis not present

## 2015-06-01 MED ORDER — OMEPRAZOLE 40 MG PO CPDR: 40.0000 mg | DELAYED_RELEASE_CAPSULE | Freq: Two times a day (BID) | ORAL | Status: DC

## 2015-06-01 MED ORDER — TRAMADOL HCL 50 MG PO TABS: 50.0000 mg | ORAL_TABLET | Freq: Two times a day (BID) | ORAL | Status: DC

## 2015-06-01 NOTE — Progress Notes (Signed)
BP 131/82 mmHg  Pulse 73  Temp(Src) 98.8 F (37.1 C) (Oral)  Ht '5\' 9"'$  (1.753 m)  Wt 213 lb 12.8 oz (96.979 kg)  BMI 31.56 kg/m2   Subjective:    Patient ID: Antonio Bowen, male    DOB: [birth year], 70 y.o.   MRN: [id number]  HPI: Antonio Bowen is a 70 y.o. male presenting on 06/01/2015 for Hyperlipidemia; Hypertension; and Referral to Pulmonologist   HPI Hypertension Patient is coming in for hypertension recheck. Her sugar today is 131/82 and patient is currently on losartan-hydrochlorothiazide. Patient denies headaches, blurred vision, chest pains, shortness of breath, or weakness. Denies any side effects from medication and is content with current medication.   Hyperlipidemia Patient is coming in today for a recheck of his cholesterol. He is currently not on any medication for this. He has been diagnosed with this previously and needs to be followed. He has done diet and exercise at this point to try to control it.  Hypothyroidism Patient is currently on levothyroxine for his thyroid and his dose was just recently increased about 8 weeks ago. He denies any issues that he knows of from it. He denies any hair changes or palpitations or diarrhea or constipation. He denies any issues with hot or cold.  COPD and history of BOOP Patient has been seen a pulmonologist to help manage this and his pulmonologist recently went into a management position so he needs a referral to a new pulmonologist. He has been having some breathing issues recently and that is why he would like to get back to go see a pulmonologist. He has mild wheezing episodes every few days. He has some nighttime symptoms about once a week. He is currently on Symbicort and albuterol rescue inhaler. He is wondering about samples and we gave samples of Breo Ellipta in  Relevant past medical, surgical, family and social history reviewed and updated as indicated. Interim medical history since our last visit reviewed. Allergies  and medications reviewed and updated.  Review of Systems  Constitutional: Negative for fever and chills.  HENT: Negative for congestion, ear discharge, ear pain, sinus pressure, sneezing and sore throat.   Eyes: Negative for discharge and visual disturbance.  Respiratory: Positive for cough, shortness of breath and wheezing.   Cardiovascular: Negative for chest pain and leg swelling.  Gastrointestinal: Negative for abdominal pain, diarrhea, constipation and abdominal distention.  Genitourinary: Negative for difficulty urinating.  Musculoskeletal: Negative for back pain and gait problem.  Skin: Negative for rash.  Neurological: Negative for dizziness, syncope, light-headedness and headaches.  All other systems reviewed and are negative.   Per HPI unless specifically indicated above     Medication List       This list is accurate as of: 06/01/15  4:54 PM.  Always use your most recent med list.               acetaminophen 500 MG tablet  Commonly known as:  TYLENOL  Take 1,000 mg by mouth every 6 (six) hours as needed (pain).     albuterol 108 (90 Base) MCG/ACT inhaler  Commonly known as:  VENTOLIN HFA  INHALE TWO PUFFS INTO LUNGS EVERY 6 HOURS AS NEEDED FOR WHEEZING     budesonide-formoterol 160-4.5 MCG/ACT inhaler  Commonly known as:  SYMBICORT  Inhale 2 puffs into the lungs 2 (two) times daily.     cetirizine 10 MG tablet  Commonly known as:  ZYRTEC  Take 10 mg by mouth daily.  fluticasone 50 MCG/ACT nasal spray  Commonly known as:  FLONASE  Place 2 sprays into both nostrils 2 (two) times daily.     FLUTTER Devi  Use three times daily     levothyroxine 125 MCG tablet  Commonly known as:  SYNTHROID, LEVOTHROID  Take 1 tablet (125 mcg total) by mouth daily.     losartan-hydrochlorothiazide 50-12.5 MG tablet  Commonly known as:  HYZAAR  Take 1 tablet by mouth daily.     magnesium oxide 400 MG tablet  Commonly known as:  MAG-OX  Take 400 mg by mouth daily.      meloxicam 15 MG tablet  Commonly known as:  MOBIC  Take 1 tablet (15 mg total) by mouth daily.     omeprazole 40 MG capsule  Commonly known as:  PRILOSEC  Take 1 capsule (40 mg total) by mouth 2 (two) times daily.     OVER THE COUNTER MEDICATION  Take 2 tablets by mouth every evening. OTC leg cramp medicine     PRESCRIPTION MEDICATION  Oxygen 3.0 liters at night, prn use in day     traMADol 50 MG tablet  Commonly known as:  ULTRAM  Take 1 tablet (50 mg total) by mouth 2 (two) times daily.     VITAMIN B-12 PO  Take 1 tablet by mouth daily.           Objective:    BP 131/82 mmHg  Pulse 73  Temp(Src) 98.8 F (37.1 C) (Oral)  Ht '5\' 9"'$  (1.753 m)  Wt 213 lb 12.8 oz (96.979 kg)  BMI 31.56 kg/m2  Wt Readings from Last 3 Encounters:  06/01/15 213 lb 12.8 oz (96.979 kg)  03/28/15 214 lb (97.07 kg)  03/21/15 213 lb (96.616 kg)    Physical Exam  Constitutional: He is oriented to person, place, and time. He appears well-developed and well-nourished. No distress.  Eyes: Conjunctivae and EOM are normal. Pupils are equal, round, and reactive to light. Right eye exhibits no discharge. No scleral icterus.  Neck: Neck supple. No thyromegaly present.  Cardiovascular: Normal rate, regular rhythm, normal heart sounds and intact distal pulses.   No murmur heard. Pulmonary/Chest: Effort normal and breath sounds normal. No respiratory distress. He has no wheezes. He has no rales.  Abdominal: He exhibits no distension. There is no tenderness. There is no rebound.  Musculoskeletal: Normal range of motion. He exhibits no edema.  Lymphadenopathy:    He has no cervical adenopathy.  Neurological: He is alert and oriented to person, place, and time. Coordination normal.  Skin: Skin is warm and dry. No rash noted. He is not diaphoretic.  Psychiatric: He has a normal mood and affect. His behavior is normal.  Vitals reviewed.   Results for orders placed or performed during the hospital  encounter of 02/18/15  CBC with Differential/Platelet  Result Value Ref Range   WBC 13.1 (H) 4.0 - 10.5 K/uL   RBC 5.28 4.22 - 5.81 MIL/uL   Hemoglobin 16.9 13.0 - 17.0 g/dL   HCT 48.7 39.0 - 52.0 %   MCV 92.2 78.0 - 100.0 fL   MCH 32.0 26.0 - 34.0 pg   MCHC 34.7 30.0 - 36.0 g/dL   RDW 13.2 11.5 - 15.5 %   Platelets 156 150 - 400 K/uL   Neutrophils Relative % 73 %   Neutro Abs 9.6 (H) 1.7 - 7.7 K/uL   Lymphocytes Relative 23 %   Lymphs Abs 3.0 0.7 - 4.0 K/uL   Monocytes  Relative 4 %   Monocytes Absolute 0.5 0.1 - 1.0 K/uL   Eosinophils Relative 0 %   Eosinophils Absolute 0.0 0.0 - 0.7 K/uL   Basophils Relative 0 %   Basophils Absolute 0.0 0.0 - 0.1 K/uL  Basic metabolic panel  Result Value Ref Range   Sodium 135 135 - 145 mmol/L   Potassium 4.1 3.5 - 5.1 mmol/L   Chloride 99 (L) 101 - 111 mmol/L   CO2 27 22 - 32 mmol/L   Glucose, Bld 124 (H) 65 - 99 mg/dL   BUN 15 6 - 20 mg/dL   Creatinine, Ser 1.57 (H) 0.61 - 1.24 mg/dL   Calcium 9.4 8.9 - 10.3 mg/dL   GFR calc non Af Amer 43 (L) >60 mL/min   GFR calc Af Amer 50 (L) >60 mL/min   Anion gap 9 5 - 15  Troponin I  Result Value Ref Range   Troponin I <0.03 <0.031 ng/mL  Troponin I  Result Value Ref Range   Troponin I <0.03 <0.031 ng/mL  I-stat troponin, ED  Result Value Ref Range   Troponin i, poc 0.00 0.00 - 0.08 ng/mL   Comment 3              Assessment & Plan:   Problem List Items Addressed This Visit      Cardiovascular and Mediastinum   Hypertension - Primary   Relevant Medications   amLODipine (NORVASC) 10 MG tablet   Other Relevant Orders   CMP14+EGFR (Completed)     Respiratory   BOOP (bronchiolitis obliterans with organizing pneumonia) ([place])   Relevant Orders   Ambulatory referral to Pulmonology     Endocrine   Hypothyroidism (Chronic)   Relevant Orders   TSH (Completed)     Other   Hyperlipidemia LDL goal <130   Relevant Medications   amLODipine (NORVASC) 10 MG tablet   Other Relevant  Orders   Lipid panel (Completed)       Follow up plan: Return in about 3 months (around 08/29/2015), or if symptoms worsen or fail to improve, for Hypertension and cholesterol recheck.  Counseling provided for all of the vaccine components Orders Placed This Encounter  Procedures  . CMP14+EGFR  . Lipid panel  . TSH  . Ambulatory referral to [place], MD [facility] 06/01/2015, 4:54 PM

## 2015-06-02 LAB — CMP14+EGFR
ALT: 39 IU/L (ref 0–44)
AST: 29 IU/L (ref 0–40)
Albumin/Globulin Ratio: 2 (ref 1.1–2.5)
Albumin: 4.7 g/dL (ref 3.6–4.8)
Alkaline Phosphatase: 90 IU/L (ref 39–117)
BUN/Creatinine Ratio: 8 — ABNORMAL LOW (ref 10–22)
BUN: 13 mg/dL (ref 8–27)
Bilirubin Total: 0.4 mg/dL (ref 0.0–1.2)
CO2: 23 mmol/L (ref 18–29)
Calcium: 9.4 mg/dL (ref 8.6–10.2)
Chloride: 103 mmol/L (ref 96–106)
Creatinine, Ser: 1.61 mg/dL — ABNORMAL HIGH (ref 0.76–1.27)
GFR calc Af Amer: 50 mL/min/{1.73_m2} — ABNORMAL LOW (ref >59)
GFR calc non Af Amer: 43 mL/min/{1.73_m2} — ABNORMAL LOW (ref >59)
Globulin, Total: 2.3 g/dL (ref 1.5–4.5)
Glucose: 88 mg/dL (ref 65–99)
Potassium: 4.4 mmol/L (ref 3.5–5.2)
Sodium: 144 mmol/L (ref 134–144)
Total Protein: 7 g/dL (ref 6.0–8.5)

## 2015-06-02 LAB — LIPID PANEL
Chol/HDL Ratio: 2.7 ratio units (ref 0.0–5.0)
Cholesterol, Total: 106 mg/dL (ref 100–199)
HDL: 39 mg/dL — ABNORMAL LOW (ref >39)
LDL Calculated: 26 mg/dL (ref 0–99)
Triglycerides: 207 mg/dL — ABNORMAL HIGH (ref 0–149)
VLDL Cholesterol Cal: 41 mg/dL — ABNORMAL HIGH (ref 5–40)

## 2015-06-02 LAB — TSH: TSH: 4.47 u[IU]/mL (ref 0.450–4.500)

## 2015-06-02 NOTE — Telephone Encounter (Signed)
I don't know why this is coming in today because I printed a prescription of the tramadol for him yesterday. He was here seeing me in office. Can we clarify why this request is happening. Caryl Pina, MD [facility] 06/02/2015, 8:48 AM

## 2015-06-02 NOTE — Telephone Encounter (Signed)
Closing encounter, looks like refill request came from mail order pharmacy right before pt's appt time.

## 2015-06-02 NOTE — Telephone Encounter (Signed)
Last seen 06/01/15  Dr [name]  If approved print

## 2015-06-03 MED ORDER — AMLODIPINE BESYLATE 10 MG PO TABS: 10.0000 mg | ORAL_TABLET | Freq: Every day | ORAL | Status: DC

## 2015-06-13 DIAGNOSIS — J449 Chronic obstructive pulmonary disease, unspecified: Secondary | ICD-10-CM | POA: Diagnosis not present

## 2015-07-11 DIAGNOSIS — J449 Chronic obstructive pulmonary disease, unspecified: Secondary | ICD-10-CM | POA: Diagnosis not present

## 2015-08-11 DIAGNOSIS — J449 Chronic obstructive pulmonary disease, unspecified: Secondary | ICD-10-CM | POA: Diagnosis not present

## 2015-09-10 DIAGNOSIS — J449 Chronic obstructive pulmonary disease, unspecified: Secondary | ICD-10-CM | POA: Diagnosis not present

## 2015-10-11 DIAGNOSIS — J449 Chronic obstructive pulmonary disease, unspecified: Secondary | ICD-10-CM | POA: Diagnosis not present

## 2015-10-13 ENCOUNTER — Other Ambulatory Visit: Payer: Self-pay | Admitting: Family Medicine

## 2015-10-13 NOTE — Telephone Encounter (Signed)
Last seen 06/01/15 Dr [name]   Last thyroid 05/06/13  Requesting 90 day supplies

## 2015-11-10 DIAGNOSIS — J449 Chronic obstructive pulmonary disease, unspecified: Secondary | ICD-10-CM | POA: Diagnosis not present

## 2015-12-11 DIAGNOSIS — J449 Chronic obstructive pulmonary disease, unspecified: Secondary | ICD-10-CM | POA: Diagnosis not present

## 2015-12-15 ENCOUNTER — Other Ambulatory Visit: Payer: Self-pay | Admitting: Family Medicine

## 2015-12-19 ENCOUNTER — Other Ambulatory Visit: Payer: Self-pay | Admitting: Family Medicine

## 2016-01-11 DIAGNOSIS — J449 Chronic obstructive pulmonary disease, unspecified: Secondary | ICD-10-CM | POA: Diagnosis not present

## 2016-02-02 ENCOUNTER — Telehealth: Payer: Self-pay | Admitting: *Deleted

## 2016-02-02 NOTE — Telephone Encounter (Signed)
appt made

## 2016-02-02 NOTE — Telephone Encounter (Signed)
Left message to schedule routine f/u with Dr [name] next week. Last seen 06/2015.

## 2016-02-09 ENCOUNTER — Encounter: Payer: Self-pay | Admitting: Family Medicine

## 2016-02-09 ENCOUNTER — Ambulatory Visit (INDEPENDENT_AMBULATORY_CARE_PROVIDER_SITE_OTHER): Payer: Commercial Managed Care - HMO | Admitting: Family Medicine

## 2016-02-09 VITALS — BP 134/86 | HR 71 | Temp 98.0°F | Ht 69.0 in | Wt 206.2 lb

## 2016-02-09 DIAGNOSIS — J441 Chronic obstructive pulmonary disease with (acute) exacerbation: Secondary | ICD-10-CM | POA: Diagnosis not present

## 2016-02-09 DIAGNOSIS — Z23 Encounter for immunization: Secondary | ICD-10-CM | POA: Diagnosis not present

## 2016-02-09 DIAGNOSIS — L02512 Cutaneous abscess of left hand: Secondary | ICD-10-CM

## 2016-02-09 DIAGNOSIS — R1310 Dysphagia, unspecified: Secondary | ICD-10-CM | POA: Diagnosis not present

## 2016-02-09 MED ORDER — BUDESONIDE-FORMOTEROL FUMARATE 160-4.5 MCG/ACT IN AERO: 2.0000 | INHALATION_SPRAY | Freq: Two times a day (BID) | RESPIRATORY_TRACT | 3 refills | Status: DC

## 2016-02-09 MED ORDER — MELOXICAM 15 MG PO TABS: ORAL_TABLET | ORAL | 1 refills | Status: DC

## 2016-02-09 MED ORDER — LEVOTHYROXINE SODIUM 125 MCG PO TABS: 125.0000 ug | ORAL_TABLET | Freq: Every day | ORAL | 1 refills | Status: DC

## 2016-02-09 MED ORDER — SULFAMETHOXAZOLE-TRIMETHOPRIM 800-160 MG PO TABS: 1.0000 | ORAL_TABLET | Freq: Two times a day (BID) | ORAL | 0 refills | Status: DC

## 2016-02-09 MED ORDER — LOSARTAN POTASSIUM-HCTZ 50-12.5 MG PO TABS: ORAL_TABLET | ORAL | 1 refills | Status: DC

## 2016-02-09 MED ORDER — TRAMADOL HCL 50 MG PO TABS: 50.0000 mg | ORAL_TABLET | Freq: Two times a day (BID) | ORAL | 0 refills | Status: DC

## 2016-02-09 MED ORDER — FLUTICASONE PROPIONATE 50 MCG/ACT NA SUSP: 2.0000 | Freq: Two times a day (BID) | NASAL | 3 refills | Status: DC

## 2016-02-09 MED ORDER — OMEPRAZOLE 40 MG PO CPDR: 40.0000 mg | DELAYED_RELEASE_CAPSULE | Freq: Two times a day (BID) | ORAL | 3 refills | Status: DC

## 2016-02-09 MED ORDER — ALBUTEROL SULFATE HFA 108 (90 BASE) MCG/ACT IN AERS: INHALATION_SPRAY | RESPIRATORY_TRACT | 4 refills | Status: DC

## 2016-02-09 NOTE — Progress Notes (Signed)
BP 134/86   Pulse 71   Temp 98 F (36.7 C) (Oral)   Ht 5\' 9"  (1.753 m)   Wt 206 lb 4 oz (93.6 kg)   BMI 30.46 kg/m    Subjective:    Patient ID: Antonio Bowen, male    DOB: Sep 19, 1945, 70 y.o.   MRN: [id number]  HPI: Antonio Bowen is a 70 y.o. male presenting on 02/09/2016 for Medication refills; Having problems swallowing food and taking medications (has had to have esophagus stretched in past.  Went to [facility], [place], [place]); and Lesion on right thumb   HPI Lesion on left thumb Patient has a lesion on his left thumb that has been there 3 or 4 days as it is currently but started out as a cut one or 2 weeks ago. He denies any fevers or chills or purulent drainage but the site is starting to get a little bit red around it and is near his lateral paronychia. He has not had any purulent drainage.  COPD recheck Patient is coming in for a COPD recheck today. He has been having minimal coughing spells but has relatively been doing very well. He denies any fevers or chills or shortness of breath or wheezing. He is using his Symbicort and doing well on it. He is rarely having to use albuterol inhalers.  Dysphagia Patient has been having some difficulty swallowing like he has had previously. Previously he has needed an EGD with dilation help with his difficulty swallowing. He would like a referral back to go to them again.  Relevant past medical, surgical, family and social history reviewed and updated as indicated. Interim medical history since our last visit reviewed. Allergies and medications reviewed and updated.  Review of Systems  Constitutional: Negative for chills and fever.  HENT: Negative for congestion, rhinorrhea, sinus pressure and sneezing.   Eyes: Negative for discharge.  Respiratory: Positive for cough. Negative for chest tightness, shortness of breath and wheezing.   Cardiovascular: Negative for chest pain and leg swelling.    Gastrointestinal: Negative for abdominal pain, constipation and diarrhea.  Musculoskeletal: Negative for back pain and gait problem.  Skin: Positive for color change. Negative for rash.  All other systems reviewed and are negative.   Per HPI unless specifically indicated above     Objective:    BP 134/86   Pulse 71   Temp 98 F (36.7 C) (Oral)   Ht 5\' 9"  (1.753 m)   Wt 206 lb 4 oz (93.6 kg)   BMI 30.46 kg/m   Wt Readings from Last 3 Encounters:  02/09/16 206 lb 4 oz (93.6 kg)  06/01/15 213 lb 12.8 oz (97 kg)  03/28/15 214 lb (97.1 kg)    Physical Exam  Constitutional: He is oriented to person, place, and time. He appears well-developed and well-nourished. No distress.  HENT:  Right Ear: External ear normal.  Left Ear: External ear normal.  Eyes: Conjunctivae are normal. Right eye exhibits no discharge. No scleral icterus.  Neck: Neck supple. No thyromegaly present.  Cardiovascular: Normal rate, regular rhythm, normal heart sounds and intact distal pulses.   No murmur heard. Pulmonary/Chest: Effort normal and breath sounds normal. No respiratory distress. He has no wheezes. He has no rales.  Abdominal: Soft. Bowel sounds are normal. He exhibits no distension. There is no tenderness. There is no rebound and no guarding.  Musculoskeletal: Normal range of motion. He exhibits no edema.  Lymphadenopathy:    He  has no cervical adenopathy.  Neurological: He is alert and oriented to person, place, and time. Coordination normal.  Skin: Skin is warm and dry. Lesion (Left thumb paronychia has a small spot that is swollen and has some fluctuance and purulent drainage currently and able to take a culture of that.) noted. No rash noted. He is not diaphoretic. There is erythema.  Psychiatric: He has a normal mood and affect. His behavior is normal.  Nursing note and vitals reviewed.     Assessment & Plan:   Problem List Items Addressed This Visit      Respiratory   Obstructive  chronic bronchitis with exacerbation Gold B Copd   Relevant Medications   albuterol (VENTOLIN HFA) 108 (90 Base) MCG/ACT inhaler   budesonide-formoterol (SYMBICORT) 160-4.5 MCG/ACT inhaler   fluticasone (FLONASE) 50 MCG/ACT nasal spray    Other Visit Diagnoses    Abscess of left thumb    -  Primary   Relevant Orders   Anaerobic and Aerobic Culture (Completed)   Dysphagia, unspecified type       Relevant Orders   Ambulatory referral to Gastroenterology       Follow up plan: Return in about 3 months (around 05/11/2016), or if symptoms worsen or fail to improve, for 3 month recheck.  Counseling provided for all of the vaccine components Orders Placed This Encounter  Procedures  . Flu Vaccine QUAD 36+ mos IM  . Ambulatory referral to Gastroenterology    Caryl Pina, MD [facility] 02/09/2016, 5:25 PM

## 2016-02-10 DIAGNOSIS — J449 Chronic obstructive pulmonary disease, unspecified: Secondary | ICD-10-CM | POA: Diagnosis not present

## 2016-02-13 LAB — ANAEROBIC AND AEROBIC CULTURE

## 2016-02-22 ENCOUNTER — Telehealth: Payer: Self-pay | Admitting: Family Medicine

## 2016-02-22 MED ORDER — FLUTICASONE PROPIONATE 50 MCG/ACT NA SUSP: 2.0000 | Freq: Every day | NASAL | 3 refills | Status: DC

## 2016-02-22 NOTE — Telephone Encounter (Signed)
Clarified directions

## 2016-02-23 ENCOUNTER — Encounter: Payer: Self-pay | Admitting: Gastroenterology

## 2016-02-23 ENCOUNTER — Ambulatory Visit (INDEPENDENT_AMBULATORY_CARE_PROVIDER_SITE_OTHER): Payer: Commercial Managed Care - HMO | Admitting: Gastroenterology

## 2016-02-23 VITALS — BP 124/84 | HR 70 | Ht 69.0 in | Wt 206.8 lb

## 2016-02-23 DIAGNOSIS — K222 Esophageal obstruction: Secondary | ICD-10-CM | POA: Diagnosis not present

## 2016-02-23 DIAGNOSIS — R1310 Dysphagia, unspecified: Secondary | ICD-10-CM | POA: Insufficient documentation

## 2016-02-23 DIAGNOSIS — Z9981 Dependence on supplemental oxygen: Secondary | ICD-10-CM | POA: Diagnosis not present

## 2016-02-23 NOTE — Patient Instructions (Signed)
You have been scheduled for an endoscopy. Please follow written instructions given to you at your visit today. If you use inhalers (even only as needed), please bring them with you on the day of your procedure. Your physician has requested that you go to www.startemmi.com and enter the access code given to you at your visit today. This web site gives a general overview about your procedure. However, you should still follow specific instructions given to you by our office regarding your preparation for the procedure.  Soft Diet until procedure, Ensure, Boost, to maintain calorie intake

## 2016-02-23 NOTE — Progress Notes (Signed)
02/23/2016 Antonio Bowen [id number] 17-Apr-1946   HISTORY OF PRESENT ILLNESS:  This is a pleasant 70 year old male who is known to Dr. Henrene Pastor.  Has history of esophageal ring that was dilated during endoscopy in April 2014. He is here with his wife today and reports that he has had progressive issues with swallowing solid food, medications, and sometimes even liquids over the past 6 months.  Says that mostly food such as rice, meats, breads get stuck. He even needes to break up in his omeprazole capsule and take the granules because they seem to get hung up at times. Even liquids sometimes would get built back up and come out his mouth or nose. He is on oxygen at bedtime.  Past Medical History:  Diagnosis Date  . Allergic rhinitis   . Arthritis   . BOOP (bronchiolitis obliterans with organizing pneumonia) ([place])   . Bronchitis   . Colon polyps   . COPD (chronic obstructive pulmonary disease) ([place])   . GERD (gastroesophageal reflux disease)   . Hypertension   . Kidney stone   . Low back pain   . Thyroid disease   . Tobacco dependence    Past Surgical History:  Procedure Laterality Date  . carpel tunnal     left  . DUPUYTREN CONTRACTURE RELEASE     Left  . FACIAL FRACTURE SURGERY    . OTHER SURGICAL HISTORY     facial surgery  . TOTAL HIP ARTHROPLASTY  01/2011   Right hip, [place]  . ULNAR NERVE TRANSPOSITION     left    reports that he quit smoking about 5 years ago. His smoking use included Cigarettes. He has a 50.00 pack-year smoking history. His smokeless tobacco use includes Snuff. He reports that he does not drink alcohol or use drugs. family history includes Arthritis in his father; Cancer in his maternal grandfather and paternal grandmother; Prostate cancer in his maternal grandfather; Stomach cancer in his maternal grandmother; Stroke in his father. Allergies  Allergen Reactions  . Nuts Nausea And Vomiting    sweating  . Vicodin [Hydrocodone-Acetaminophen]    tachycardia  . Penicillins     Rash, hives  . Aspirin Other (See Comments)    Upset stomach, bleeding  . Other Nausea And Vomiting    Per patient, cannot take any narcotic. Nausea and vomiting      Outpatient Encounter Prescriptions as of 02/23/2016  Medication Sig  . acetaminophen (TYLENOL) 500 MG tablet Take 1,000 mg by mouth every 6 (six) hours as needed (pain).   Marland Kitchen albuterol (VENTOLIN HFA) 108 (90 Base) MCG/ACT inhaler INHALE TWO PUFFS INTO LUNGS EVERY 6 HOURS AS NEEDED FOR WHEEZING  . budesonide-formoterol (SYMBICORT) 160-4.5 MCG/ACT inhaler Inhale 2 puffs into the lungs 2 (two) times daily.  . cetirizine (ZYRTEC) 10 MG tablet Take 10 mg by mouth daily.  . Cyanocobalamin (VITAMIN B-12 PO) Take 1 tablet by mouth daily.   . fluticasone (FLONASE) 50 MCG/ACT nasal spray Place 2 sprays into both nostrils daily.  Marland Kitchen levothyroxine (SYNTHROID, LEVOTHROID) 125 MCG tablet Take 1 tablet (125 mcg total) by mouth daily.  Marland Kitchen losartan-hydrochlorothiazide (HYZAAR) 50-12.5 MG tablet TAKE 1 TABLET EVERY DAY  . magnesium oxide (MAG-OX) 400 MG tablet Take 400 mg by mouth daily.  . meloxicam (MOBIC) 15 MG tablet TAKE 1 TABLET EVERY DAY (NEEDS TO BE SEEN BEFORE NEXT REFILL)  . omeprazole (PRILOSEC) 40 MG capsule Take 1 capsule (40 mg total) by mouth 2 (two) times  daily.  Marland Kitchen OVER THE COUNTER MEDICATION Take 2 tablets by mouth every evening. OTC leg cramp medicine  . PRESCRIPTION MEDICATION Oxygen 3.0 liters at night, prn use in day  . Respiratory Therapy Supplies (FLUTTER) DEVI Use three times daily  . traMADol (ULTRAM) 50 MG tablet Take 1 tablet (50 mg total) by mouth 2 (two) times daily.  . [DISCONTINUED] sulfamethoxazole-trimethoprim (BACTRIM DS,SEPTRA DS) 800-160 MG tablet Take 1 tablet by mouth 2 (two) times daily. (Patient not taking: Reported on 02/23/2016)   No facility-administered encounter medications on file as of 02/23/2016.      REVIEW OF SYSTEMS  : All other systems reviewed and negative  except where noted in the History of Present Illness.   PHYSICAL EXAM: BP 124/84   Pulse 70   Ht 5\' 9"  (1.753 m)   Wt 206 lb 12.8 oz (93.8 kg)   BMI 30.54 kg/m  General: Well developed white male in no acute distress Head: Normocephalic and atraumatic Eyes:  Sclerae anicteric, conjunctiva pink. Ears: Normal auditory acuity Lungs: Clear throughout to auscultation Heart: Regular rate and rhythm Abdomen: Soft, non-distended.  Normal bowel sounds.  Non-tender. Musculoskeletal: Symmetrical with no gross deformities  Skin: No lesions on visible extremities Extremities: No edema  Neurological: Alert oriented x 4, grossly non-focal Psychological:  Alert and cooperative. Normal mood and affect  ASSESSMENT AND PLAN: -Dysphagia with history of esophageal ring:  Needs EGD with dilation with Dr. Henrene Pastor.  Will schedule but needs to be at [facility] hospital due to nighttime O2 use.  Will take a soft diet with plenty of full liquids including Boost, Ensure, etc in the interim.  CC:  [name], [name], MD

## 2016-02-23 NOTE — Progress Notes (Signed)
Case discussed with physician assistant. Agree with initial assessment and plans

## 2016-03-12 DIAGNOSIS — J449 Chronic obstructive pulmonary disease, unspecified: Secondary | ICD-10-CM | POA: Diagnosis not present

## 2016-03-14 ENCOUNTER — Encounter (HOSPITAL_COMMUNITY): Payer: Self-pay | Admitting: *Deleted

## 2016-03-25 NOTE — Anesthesia Preprocedure Evaluation (Addendum)
Anesthesia Evaluation  Patient identified by MRN, date of birth, ID band Patient awake    Reviewed: Allergy & Precautions, NPO status , Patient's Chart, lab work & pertinent test results  History of Anesthesia Complications Negative for: history of anesthetic complications  Airway Mallampati: I  TM Distance: >3 FB Neck ROM: Full    Dental no notable dental hx. (+) Dental Advisory Given, Edentulous Upper, Edentulous Lower   Pulmonary COPD,  oxygen dependent, former smoker,    Pulmonary exam normal        Cardiovascular hypertension, Normal cardiovascular exam     Neuro/Psych CVA, No Residual Symptoms negative psych ROS   GI/Hepatic Neg liver ROS, GERD  ,  Endo/Other  Hypothyroidism   Renal/GU negative Renal ROS     Musculoskeletal   Abdominal   Peds  Hematology   Anesthesia Other Findings   Reproductive/Obstetrics                            Anesthesia Physical Anesthesia Plan  ASA: III  Anesthesia Plan: MAC   Post-op Pain Management:    Induction: Intravenous  Airway Management Planned: Natural Airway and Simple Face Mask  Additional Equipment:   Intra-op Plan:   Post-operative Plan:   Informed Consent: I have reviewed the patients History and Physical, chart, labs and discussed the procedure including the risks, benefits and alternatives for the proposed anesthesia with the patient or authorized representative who has indicated his/her understanding and acceptance.   Dental advisory given  Plan Discussed with: CRNA and Anesthesiologist  Anesthesia Plan Comments:        Anesthesia Quick Evaluation

## 2016-03-26 ENCOUNTER — Encounter (HOSPITAL_COMMUNITY)
Admission: RE | Disposition: A | Discharge status: Home / Self Care | Payer: Self-pay | Source: Ambulatory Visit | Attending: Internal Medicine

## 2016-03-26 ENCOUNTER — Ambulatory Visit (HOSPITAL_COMMUNITY): Payer: Commercial Managed Care - HMO | Admitting: Anesthesiology

## 2016-03-26 ENCOUNTER — Encounter (HOSPITAL_COMMUNITY): Payer: Self-pay

## 2016-03-26 ENCOUNTER — Ambulatory Visit (HOSPITAL_COMMUNITY)
Admission: RE | Admit: 2016-03-26 | Discharge: 2016-03-26 | Disposition: A | Discharge status: Home / Self Care | Payer: Commercial Managed Care - HMO | Source: Ambulatory Visit | Attending: Internal Medicine | Admitting: Internal Medicine

## 2016-03-26 DIAGNOSIS — R1310 Dysphagia, unspecified: Secondary | ICD-10-CM | POA: Diagnosis not present

## 2016-03-26 DIAGNOSIS — K219 Gastro-esophageal reflux disease without esophagitis: Secondary | ICD-10-CM | POA: Insufficient documentation

## 2016-03-26 DIAGNOSIS — K449 Diaphragmatic hernia without obstruction or gangrene: Secondary | ICD-10-CM | POA: Diagnosis not present

## 2016-03-26 DIAGNOSIS — Z96641 Presence of right artificial hip joint: Secondary | ICD-10-CM | POA: Diagnosis not present

## 2016-03-26 DIAGNOSIS — K222 Esophageal obstruction: Secondary | ICD-10-CM

## 2016-03-26 DIAGNOSIS — Z87891 Personal history of nicotine dependence: Secondary | ICD-10-CM | POA: Diagnosis not present

## 2016-03-26 DIAGNOSIS — Z79899 Other long term (current) drug therapy: Secondary | ICD-10-CM | POA: Insufficient documentation

## 2016-03-26 DIAGNOSIS — I10 Essential (primary) hypertension: Secondary | ICD-10-CM | POA: Insufficient documentation

## 2016-03-26 DIAGNOSIS — Z791 Long term (current) use of non-steroidal anti-inflammatories (NSAID): Secondary | ICD-10-CM | POA: Insufficient documentation

## 2016-03-26 DIAGNOSIS — Z9981 Dependence on supplemental oxygen: Secondary | ICD-10-CM | POA: Insufficient documentation

## 2016-03-26 DIAGNOSIS — J449 Chronic obstructive pulmonary disease, unspecified: Secondary | ICD-10-CM | POA: Insufficient documentation

## 2016-03-26 DIAGNOSIS — Z7951 Long term (current) use of inhaled steroids: Secondary | ICD-10-CM | POA: Insufficient documentation

## 2016-03-26 DIAGNOSIS — E039 Hypothyroidism, unspecified: Secondary | ICD-10-CM | POA: Insufficient documentation

## 2016-03-26 DIAGNOSIS — Z8673 Personal history of transient ischemic attack (TIA), and cerebral infarction without residual deficits: Secondary | ICD-10-CM | POA: Diagnosis not present

## 2016-03-26 HISTORY — DX: Cardiac arrhythmia, unspecified: I49.9

## 2016-03-26 HISTORY — DX: Dyspnea, unspecified: R06.00

## 2016-03-26 HISTORY — DX: Hypothyroidism, unspecified: E03.9

## 2016-03-26 HISTORY — PX: ESOPHAGOGASTRODUODENOSCOPY (EGD) WITH PROPOFOL: SHX5813

## 2016-03-26 HISTORY — DX: Dependence on supplemental oxygen: Z99.81

## 2016-03-26 HISTORY — DX: Cerebral infarction, unspecified: I63.9

## 2016-03-26 SURGERY — ESOPHAGOGASTRODUODENOSCOPY (EGD) WITH PROPOFOL
Anesthesia: Monitor Anesthesia Care

## 2016-03-26 MED ORDER — LACTATED RINGERS IV SOLN: INTRAVENOUS | Status: DC

## 2016-03-26 MED ORDER — PROPOFOL 10 MG/ML IV BOLUS
INTRAVENOUS | Status: AC
  Filled 2016-03-26: qty 20

## 2016-03-26 MED ORDER — SODIUM CHLORIDE 0.9 % IV SOLN: INTRAVENOUS | Status: DC

## 2016-03-26 MED ORDER — LACTATED RINGERS IV SOLN
INTRAVENOUS | Status: DC | PRN
  Administered 2016-03-26: 08:00:00 via INTRAVENOUS

## 2016-03-26 MED ORDER — PROPOFOL 10 MG/ML IV BOLUS
INTRAVENOUS | Status: DC | PRN
  Administered 2016-03-26 (×3): 20 mg via INTRAVENOUS
  Administered 2016-03-26: 10 mg via INTRAVENOUS
  Administered 2016-03-26 (×2): 20 mg via INTRAVENOUS

## 2016-03-26 SURGICAL SUPPLY — 14 items

## 2016-03-26 NOTE — Transfer of Care (Signed)
Immediate Anesthesia Transfer of Care Note  Patient: Antonio Bowen  Procedure(s) Performed: Procedure(s): ESOPHAGOGASTRODUODENOSCOPY (EGD) WITH PROPOFOL (N/A)  Patient Location: PACU  Anesthesia Type:MAC  Level of Consciousness: sedated  Airway & Oxygen Therapy: Patient Spontanous Breathing and Patient connected to face mask oxygen  Post-op Assessment: Report given to RN and Post -op Vital signs reviewed and stable  Post vital signs: Reviewed and stable  Last Vitals:  Vitals:   03/26/16 0756 03/26/16 0757  BP: 134/81   Pulse: 72   Resp: 18   Temp:  37.1 C    Last Pain: There were no vitals filed for this visit.       Complications: No apparent anesthesia complications

## 2016-03-26 NOTE — Anesthesia Postprocedure Evaluation (Addendum)
Anesthesia Post Note  Patient: Antonio Bowen  Procedure(s) Performed: Procedure(s) (LRB): ESOPHAGOGASTRODUODENOSCOPY (EGD) WITH PROPOFOL (N/A)  Patient location during evaluation: Endoscopy Anesthesia Type: MAC Level of consciousness: awake and alert Pain management: pain level controlled Vital Signs Assessment: post-procedure vital signs reviewed and stable Respiratory status: spontaneous breathing and respiratory function stable Cardiovascular status: stable Anesthetic complications: no    Last Vitals:  Vitals:   03/26/16 0857 03/26/16 0900  BP: (!) 128/97 119/78  Pulse: 84   Resp: 19   Temp: 36.8 C     Last Pain:  Vitals:   03/26/16 0857  TempSrc: Oral                 [name] DANIEL

## 2016-03-26 NOTE — Addendum Note (Signed)
Addendum  created 03/26/16 1344 by Duane Boston, MD   Sign clinical note

## 2016-03-26 NOTE — Discharge Instructions (Signed)
YOU HAD AN ENDOSCOPIC PROCEDURE TODAY: Refer to the procedure report and other information in the discharge instructions given to you for any specific questions about what was found during the examination. If this information does not answer your questions, please call [place] office at [phone] to clarify.  ° °YOU SHOULD EXPECT: Some feelings of bloating in the abdomen. Passage of more gas than usual. Walking can help get rid of the air that was put into your GI tract during the procedure and reduce the bloating. If you had a lower endoscopy (such as a colonoscopy or flexible sigmoidoscopy) you may notice spotting of blood in your stool or on the toilet paper. Some abdominal soreness may be present for a day or two, also. ° °DIET: Your first meal following the procedure should be a light meal and then it is ok to progress to your normal diet. A half-sandwich or bowl of soup is an example of a good first meal. Heavy or fried foods are harder to digest and may make you feel nauseous or bloated. Drink plenty of fluids but you should avoid alcoholic beverages for 24 hours. If you had a esophageal dilation, please see attached instructions for diet.   ° °ACTIVITY: Your care partner should take you home directly after the procedure. You should plan to take it easy, moving slowly for the rest of the day. You can resume normal activity the day after the procedure however YOU SHOULD NOT DRIVE, use power tools, machinery or perform tasks that involve climbing or major physical exertion for 24 hours (because of the sedation medicines used during the test).  ° °SYMPTOMS TO REPORT IMMEDIATELY: °A gastroenterologist can be reached at any hour. Please call [phone]  for any of the following symptoms:  °Following lower endoscopy (colonoscopy, flexible sigmoidoscopy) °Excessive amounts of blood in the stool  °Significant tenderness, worsening of abdominal pains  °Swelling of the abdomen that is new, acute  °Fever of 100° or  higher  °Following upper endoscopy (EGD, EUS, ERCP, esophageal dilation) °Vomiting of blood or coffee ground material  °New, significant abdominal pain  °New, significant chest pain or pain under the shoulder blades  °Painful or persistently difficult swallowing  °New shortness of breath  °Black, tarry-looking or red, bloody stools ° °FOLLOW UP:  °If any biopsies were taken you will be contacted by phone or by letter within the next 1-3 weeks. Call [phone]  if you have not heard about the biopsies in 3 weeks.  °Please also call with any specific questions about appointments or follow up tests. ° °

## 2016-03-26 NOTE — Op Note (Signed)
[facility] Patient Name: Antonio Bowen Procedure Date: 03/26/2016 MRN: [id number] Attending MD: Docia Chuck. Henrene Pastor , MD Date of Birth: [birth year] CSN: [id number] Age: 70 Admit Type: Outpatient Procedure:                Upper GI endoscopy with [facility] dilation 80F Indications:              Dysphagia Providers:                Docia Chuck. Henrene Pastor, MD, Zenon Mayo, RN, [facility], Technician, Venus Alday CRNA, CRNA Referring MD:              Medicines:                Monitored Anesthesia Care Complications:            No immediate complications. Estimated Blood Loss:     Estimated blood loss: none. Procedure:                Pre-Anesthesia Assessment:                           - Prior to the procedure, a History and Physical                            was performed, and patient medications and                            allergies were reviewed. The patient's tolerance of                            previous anesthesia was also reviewed. The risks                            and benefits of the procedure and the sedation                            options and risks were discussed with the patient.                            All questions were answered, and informed consent                            was obtained. Prior Anticoagulants: The patient has                            taken no previous anticoagulant or antiplatelet                            agents. ASA Grade Assessment: III - A patient with                            severe systemic disease. After reviewing the risks  and benefits, the patient was deemed in                            satisfactory condition to undergo the procedure.                           After obtaining informed consent, the endoscope was                            passed under direct vision. Throughout the                            procedure, the patient's blood pressure, pulse, and                        oxygen saturations were monitored continuously. The                            Endoscope was introduced through the mouth, and                            advanced to the second part of duodenum. The upper                            GI endoscopy was accomplished without difficulty.                            The patient tolerated the procedure well. Scope In: Scope Out: Findings:      One moderate benign-appearing, intrinsic stenosis was found 39 cm from       the incisors. This measured 1.4 cm (inner diameter). The scope was       withdrawn. Dilation was performed with a Maloney dilator with no       resistance at 9 Fr. No resistance or heme. tolerated well      The exam of the esophagus was otherwise normal.      The stomach was normal with small hiatal hernia.      The examined duodenum was normal.      The cardia and gastric fundus were normal on retroflexion. Impression:               - Benign-appearing esophageal stenosis. Dilated.                           - Normal stomach.                           - Normal examined duodenum.                           - No specimens collected. Moderate Sedation:      none Recommendation:           - Patient has a contact number available for                            emergencies. The signs and symptoms of potential  delayed complications were discussed with the                            patient. Return to normal activities tomorrow.                            Written discharge instructions were provided to the                            patient.                           - Clears for 2 hours then soft food till am then                            prior diet.                           - Continue present medications. GI follow up as                            needed Procedure Code(s):        --- Professional ---                           [phone], Esophagogastroduodenoscopy, flexible,                             transoral; diagnostic, including collection of                            specimen(s) by brushing or washing, when performed                            (separate procedure)                           43450, Dilation of esophagus, by unguided sound or                            bougie, single or multiple passes Diagnosis Code(s):        --- Professional ---                           K22.2, Esophageal obstruction                           R13.10, Dysphagia, unspecified CPT copyright 2016 [employer]. All rights reserved. The codes documented in this report are preliminary and upon coder review may  be revised to meet current compliance requirements. Docia Chuck. Henrene Pastor, MD 03/26/2016 8:58:25 AM This report has been signed electronically. Number of Addenda: 0

## 2016-03-26 NOTE — H&P (Signed)
Antonio Bowen is an 70 y.o. male.  See office note below Chief Complaint: dysphagia HPI: see recent office note at bottom.  Past Medical History:  Diagnosis Date  . Allergic rhinitis   . Arthritis   . BOOP (bronchiolitis obliterans with organizing pneumonia) ([place])   . Bronchitis   . Colon polyps   . COPD (chronic obstructive pulmonary disease) ([place])   . Dyspnea    with exertion   . Dysrhythmia    skipped beats- followed by Dr Angelena Form   . GERD (gastroesophageal reflux disease)   . Hypertension   . Hypothyroidism   . Kidney stone   . Low back pain   . Oxygen dependent    uses 3L/[place] at nite occasional during day if needed   . Stroke [facility])    hx of 2 strokes which patient did not know he had had   . Thyroid disease   . Tobacco dependence     Past Surgical History:  Procedure Laterality Date  . broncoscopy     . carpel tunnal     left  . COLONOSCOPY     x 4  . DUPUYTREN CONTRACTURE RELEASE     Left  . ESOPHAGOGASTRODUODENOSCOPY ENDOSCOPY     x 3  . FACIAL FRACTURE SURGERY    . OTHER SURGICAL HISTORY     facial surgery  . TOTAL HIP ARTHROPLASTY  01/2011   Right hip, [place]  . ULNAR NERVE TRANSPOSITION     left    Family History  Problem Relation Age of Onset  . Arthritis Father   . Stroke Father   . Cancer Maternal Grandfather   . Prostate cancer Maternal Grandfather   . Stomach cancer Maternal Grandmother   . Cancer Paternal Grandmother   . Colon cancer      mat 1st cousin   Social History:  reports that he quit smoking about 5 years ago. His smoking use included Cigarettes. He has a 50.00 pack-year smoking history. His smokeless tobacco use includes Snuff. He reports that he drinks alcohol. He reports that he does not use drugs.  Allergies:  Allergies  Allergen Reactions  . Nuts Nausea And Vomiting    sweating  . Vicodin [Hydrocodone-Acetaminophen]     tachycardia  . Aspirin Other (See Comments)    Upset stomach, bleeding  . Other Nausea And  Vomiting    Per patient, cannot take any narcotic. Nausea and vomiting  . Penicillins Hives and Rash    Has patient had a PCN reaction causing immediate rash, facial/tongue/throat swelling, SOB or lightheadedness with hypotension:No Has patient had a PCN reaction causing severe rash involving mucus membranes or skin necrosis:No Has patient had a PCN reaction that required hospitalization:No Has patient had a PCN reaction occurring within the last 10 years:Yes If all of the above answers are "NO", then may proceed with Cephalosporin use.     Medications Prior to Admission  Medication Sig Dispense Refill  . acetaminophen (TYLENOL) 500 MG tablet Take 1,000 mg by mouth 2 (two) times daily as needed (for pain.).    Marland Kitchen albuterol (VENTOLIN HFA) 108 (90 Base) MCG/ACT inhaler INHALE TWO PUFFS INTO LUNGS EVERY 6 HOURS AS NEEDED FOR WHEEZING 2 Inhaler 4  . budesonide-formoterol (SYMBICORT) 160-4.5 MCG/ACT inhaler Inhale 2 puffs into the lungs 2 (two) times daily. 1 Inhaler 3  . cetirizine (ZYRTEC) 10 MG tablet Take 10 mg by mouth at bedtime.     . fluticasone (FLONASE) 50 MCG/ACT nasal spray Place 2  sprays into both nostrils daily. (Patient taking differently: Place 2 sprays into both nostrils 2 (two) times daily. ) 16 g 3  . Homeopathic Products (LEG CRAMP RELIEF PO) Take 2 tablets by mouth at bedtime. Hyland's    . levothyroxine (SYNTHROID, LEVOTHROID) 125 MCG tablet Take 1 tablet (125 mcg total) by mouth daily. 90 tablet 1  . losartan-hydrochlorothiazide (HYZAAR) 50-12.5 MG tablet TAKE 1 TABLET EVERY DAY 90 tablet 1  . magnesium oxide (MAG-OX) 400 MG tablet Take 400 mg by mouth at bedtime.     . meloxicam (MOBIC) 15 MG tablet TAKE 1 TABLET EVERY DAY (NEEDS TO BE SEEN BEFORE NEXT REFILL) (Patient taking differently: Take 15 mg by mouth daily. ) 90 tablet 1  . omeprazole (PRILOSEC) 40 MG capsule Take 1 capsule (40 mg total) by mouth 2 (two) times daily. 180 capsule 3  . PRESCRIPTION MEDICATION Oxygen  3.0 liters at night, prn use in day    . Respiratory Therapy Supplies (FLUTTER) DEVI Use three times daily 1 each 0  . SUPER B COMPLEX/C PO Take 1 capsule by mouth at bedtime.    . traMADol (ULTRAM) 50 MG tablet Take 1 tablet (50 mg total) by mouth 2 (two) times daily. (Patient taking differently: Take 50 mg by mouth every 6 (six) hours as needed (for pain.). ) 180 tablet 0  . Propylene Glycol (SYSTANE BALANCE) 0.6 % SOLN Place 1-2 drops into both eyes 4 (four) times daily as needed (for dry eyes).      No results found for this or any previous visit (from the past 48 hour(s)). No results found.  ROS  Blood pressure 134/81, pulse 72, temperature 98.7 F (37.1 C), resp. rate 18, height 5\' 9"  (1.753 m), weight 208 lb (94.3 kg), SpO2 97 %. Physical Exam   Assessment/Plan EGD with dilation  [name], MD HISTORY OF PRESENT ILLNESS:  This is a pleasant 70 year old male who is known to Dr. Henrene Pastor.  Has history of esophageal ring that was dilated during endoscopy in April 2014. He is here with his wife today and reports that he has had progressive issues with swallowing solid food, medications, and sometimes even liquids over the past 6 months.  Says that mostly food such as rice, meats, breads get stuck. He even needes to break up in his omeprazole capsule and take the granules because they seem to get hung up at times. Even liquids sometimes would get built back up and come out his mouth or nose. He is on oxygen at bedtime.      Past Medical History:  Diagnosis Date  . Allergic rhinitis   . Arthritis   . BOOP (bronchiolitis obliterans with organizing pneumonia) ([place])   . Bronchitis   . Colon polyps   . COPD (chronic obstructive pulmonary disease) ([place])   . GERD (gastroesophageal reflux disease)   . Hypertension   . Kidney stone   . Low back pain   . Thyroid disease   . Tobacco dependence         Past Surgical History:  Procedure Laterality Date  . carpel tunnal      left  . DUPUYTREN CONTRACTURE RELEASE     Left  . FACIAL FRACTURE SURGERY    . OTHER SURGICAL HISTORY     facial surgery  . TOTAL HIP ARTHROPLASTY  01/2011   Right hip, [place]  . ULNAR NERVE TRANSPOSITION     left    reports that he quit smoking about 5  years ago. His smoking use included Cigarettes. He has a 50.00 pack-year smoking history. His smokeless tobacco use includes Snuff. He reports that he does not drink alcohol or use drugs. family history includes Arthritis in his father; Cancer in his maternal grandfather and paternal grandmother; Prostate cancer in his maternal grandfather; Stomach cancer in his maternal grandmother; Stroke in his father.      Allergies  Allergen Reactions  . Nuts Nausea And Vomiting    sweating  . Vicodin [Hydrocodone-Acetaminophen]     tachycardia  . Penicillins     Rash, hives  . Aspirin Other (See Comments)    Upset stomach, bleeding  . Other Nausea And Vomiting    Per patient, cannot take any narcotic. Nausea and vomiting          Outpatient Encounter Prescriptions as of 02/23/2016  Medication Sig  . acetaminophen (TYLENOL) 500 MG tablet Take 1,000 mg by mouth every 6 (six) hours as needed (pain).   Marland Kitchen albuterol (VENTOLIN HFA) 108 (90 Base) MCG/ACT inhaler INHALE TWO PUFFS INTO LUNGS EVERY 6 HOURS AS NEEDED FOR WHEEZING  . budesonide-formoterol (SYMBICORT) 160-4.5 MCG/ACT inhaler Inhale 2 puffs into the lungs 2 (two) times daily.  . cetirizine (ZYRTEC) 10 MG tablet Take 10 mg by mouth daily.  . Cyanocobalamin (VITAMIN B-12 PO) Take 1 tablet by mouth daily.   . fluticasone (FLONASE) 50 MCG/ACT nasal spray Place 2 sprays into both nostrils daily.  Marland Kitchen levothyroxine (SYNTHROID, LEVOTHROID) 125 MCG tablet Take 1 tablet (125 mcg total) by mouth daily.  Marland Kitchen losartan-hydrochlorothiazide (HYZAAR) 50-12.5 MG tablet TAKE 1 TABLET EVERY DAY  . magnesium oxide (MAG-OX) 400 MG tablet Take 400 mg by mouth daily.  . meloxicam  (MOBIC) 15 MG tablet TAKE 1 TABLET EVERY DAY (NEEDS TO BE SEEN BEFORE NEXT REFILL)  . omeprazole (PRILOSEC) 40 MG capsule Take 1 capsule (40 mg total) by mouth 2 (two) times daily.  Marland Kitchen OVER THE COUNTER MEDICATION Take 2 tablets by mouth every evening. OTC leg cramp medicine  . PRESCRIPTION MEDICATION Oxygen 3.0 liters at night, prn use in day  . Respiratory Therapy Supplies (FLUTTER) DEVI Use three times daily  . traMADol (ULTRAM) 50 MG tablet Take 1 tablet (50 mg total) by mouth 2 (two) times daily.  . [DISCONTINUED] sulfamethoxazole-trimethoprim (BACTRIM DS,SEPTRA DS) 800-160 MG tablet Take 1 tablet by mouth 2 (two) times daily. (Patient not taking: Reported on 02/23/2016)   No facility-administered encounter medications on file as of 02/23/2016.      REVIEW OF SYSTEMS  : All other systems reviewed and negative except where noted in the History of Present Illness.   PHYSICAL EXAM: BP 124/84   Pulse 70   Ht 5\' 9"  (1.753 m)   Wt 206 lb 12.8 oz (93.8 kg)   BMI 30.54 kg/m  General: Well developed white male in no acute distress Head: Normocephalic and atraumatic Eyes:  Sclerae anicteric, conjunctiva pink. Ears: Normal auditory acuity Lungs: Clear throughout to auscultation Heart: Regular rate and rhythm Abdomen: Soft, non-distended.  Normal bowel sounds.  Non-tender. Musculoskeletal: Symmetrical with no gross deformities  Skin: No lesions on visible extremities Extremities: No edema  Neurological: Alert oriented x 4, grossly non-focal Psychological:  Alert and cooperative. Normal mood and affect  ASSESSMENT AND PLAN: -Dysphagia with history of esophageal ring:  Needs EGD with dilation with Dr. Henrene Pastor.  Will schedule but needs to be at [facility] hospital due to nighttime O2 use.  Will take a soft diet with plenty of full  liquids including Boost, Ensure, etc in the interim.   03/26/2016, 8:43 AM

## 2016-03-28 ENCOUNTER — Encounter (HOSPITAL_COMMUNITY): Payer: Self-pay | Admitting: Internal Medicine

## 2016-04-02 ENCOUNTER — Ambulatory Visit (INDEPENDENT_AMBULATORY_CARE_PROVIDER_SITE_OTHER): Payer: Commercial Managed Care - HMO | Admitting: Family Medicine

## 2016-04-02 ENCOUNTER — Encounter: Payer: Self-pay | Admitting: Family Medicine

## 2016-04-02 VITALS — BP 134/89 | HR 85 | Temp 97.4°F | Ht 69.0 in | Wt 203.2 lb

## 2016-04-02 DIAGNOSIS — J441 Chronic obstructive pulmonary disease with (acute) exacerbation: Secondary | ICD-10-CM | POA: Diagnosis not present

## 2016-04-02 MED ORDER — BUDESONIDE-FORMOTEROL FUMARATE 160-4.5 MCG/ACT IN AERO: 2.0000 | INHALATION_SPRAY | Freq: Two times a day (BID) | RESPIRATORY_TRACT | 3 refills | Status: DC

## 2016-04-02 MED ORDER — METHYLPREDNISOLONE ACETATE 80 MG/ML IJ SUSP
80.0000 mg | Freq: Once | INTRAMUSCULAR | Status: AC
  Administered 2016-04-02: 80 mg via INTRAMUSCULAR

## 2016-04-02 MED ORDER — AZITHROMYCIN 250 MG PO TABS: ORAL_TABLET | ORAL | 0 refills | Status: DC

## 2016-04-02 MED ORDER — PREDNISONE 20 MG PO TABS: ORAL_TABLET | ORAL | 0 refills | Status: DC

## 2016-04-02 NOTE — Progress Notes (Signed)
BP 134/89   Pulse 85   Temp 97.4 F (36.3 C) (Oral)   Ht 5\' 9"  (1.753 m)   Wt 203 lb 4 oz (92.2 kg)   SpO2 97%   BMI 30.01 kg/m    Subjective:    Patient ID: Antonio Bowen, male    DOB: [birth year], 70 y.o.   MRN: [id number]  HPI: HARM TEO is a 70 y.o. male presenting on 04/02/2016 for Cough, shortness of breath (x 1 week); Sinusitis (sinus congestion and pressure); and Chest congestion   HPI Cough and sinus congestion and wheezing Patient has had cough and increased shortness of breath and wheezing and sinus congestion and postnasal drainage and chest congestion that's been going on since last Tuesday about 5 days ago. He has been using his Symbicort and his Flonase and the Nettie pot and albuterol inhaler to help and they are helping but he has continued to have significant wheezing and congestion and shortness of breath. On the first couple days when it started he did have some chills and sweats and possible fevers although he did not take his temperature. Since that time it's just been the cough and the shortness of breath. His cough is mostly nonproductive. Occasionally he does produce some phlegm that is green in color. He denies any sick contacts that he knows of.  Relevant past medical, surgical, family and social history reviewed and updated as indicated. Interim medical history since our last visit reviewed. Allergies and medications reviewed and updated.  Review of Systems  Constitutional: Positive for chills and fever.  HENT: Positive for congestion, postnasal drip, rhinorrhea, sinus pressure and sore throat. Negative for ear discharge, ear pain, sneezing and voice change.   Eyes: Negative for pain, discharge, redness and visual disturbance.  Respiratory: Positive for cough, shortness of breath and wheezing.   Cardiovascular: Negative for chest pain and leg swelling.  Musculoskeletal: Negative for gait problem.  Skin: Negative for rash.  All other systems  reviewed and are negative.   Per HPI unless specifically indicated above     Objective:    BP 134/89   Pulse 85   Temp 97.4 F (36.3 C) (Oral)   Ht 5\' 9"  (1.753 m)   Wt 203 lb 4 oz (92.2 kg)   SpO2 97%   BMI 30.01 kg/m   Wt Readings from Last 3 Encounters:  04/02/16 203 lb 4 oz (92.2 kg)  03/26/16 208 lb (94.3 kg)  02/23/16 206 lb 12.8 oz (93.8 kg)    Physical Exam  Constitutional: He is oriented to person, place, and time. He appears well-developed and well-nourished. No distress.  HENT:  Right Ear: Tympanic membrane, external ear and ear canal normal.  Left Ear: Tympanic membrane, external ear and ear canal normal.  Nose: Mucosal edema and rhinorrhea present. No sinus tenderness. No epistaxis. Right sinus exhibits maxillary sinus tenderness. Right sinus exhibits no frontal sinus tenderness. Left sinus exhibits maxillary sinus tenderness. Left sinus exhibits no frontal sinus tenderness.  Mouth/Throat: Uvula is midline and mucous membranes are normal. Posterior oropharyngeal edema and posterior oropharyngeal erythema present. No oropharyngeal exudate or tonsillar abscesses.  Eyes: Conjunctivae and EOM are normal. Pupils are equal, round, and reactive to light. Right eye exhibits no discharge. No scleral icterus.  Neck: Neck supple. No thyromegaly present.  Cardiovascular: Normal rate, regular rhythm, normal heart sounds and intact distal pulses.   No murmur heard. Pulmonary/Chest: Effort normal. No respiratory distress. He has no decreased breath sounds. He has  wheezes in the right upper field, the right middle field, the right lower field, the left upper field, the left middle field and the left lower field. He has rhonchi in the right upper field, the right middle field, the right lower field, the left upper field, the left middle field and the left lower field. He has no rales.  Musculoskeletal: Normal range of motion. He exhibits no edema.  Lymphadenopathy:    He has no  cervical adenopathy.  Neurological: He is alert and oriented to person, place, and time. Coordination normal.  Skin: Skin is warm and dry. No rash noted. He is not diaphoretic.  Psychiatric: He has a normal mood and affect. His behavior is normal.  Nursing note and vitals reviewed.     Assessment & Plan:   Problem List Items Addressed This Visit      Respiratory   Obstructive chronic bronchitis with exacerbation Gold B Copd   Relevant Medications   methylPREDNISolone acetate (DEPO-MEDROL) injection 80 mg (Start on 04/02/2016 11:00 AM)   azithromycin (ZITHROMAX) 250 MG tablet   predniSONE (DELTASONE) 20 MG tablet   budesonide-formoterol (SYMBICORT) 160-4.5 MCG/ACT inhaler    Other Visit Diagnoses    COPD exacerbation ([place])    -  Primary   Relevant Medications   methylPREDNISolone acetate (DEPO-MEDROL) injection 80 mg (Start on 04/02/2016 11:00 AM)   azithromycin (ZITHROMAX) 250 MG tablet   predniSONE (DELTASONE) 20 MG tablet   budesonide-formoterol (SYMBICORT) 160-4.5 MCG/ACT inhaler       Follow up plan: Return in about 3 months (around 07/01/2016), or if symptoms worsen or fail to improve, for Follow-up COPD and breathing.  Counseling provided for all of the vaccine components No orders of the defined types were placed in this encounter.   Caryl Pina, MD [facility] 04/02/2016, 10:54 AM

## 2016-04-11 DIAGNOSIS — J449 Chronic obstructive pulmonary disease, unspecified: Secondary | ICD-10-CM | POA: Diagnosis not present

## 2016-05-12 DIAGNOSIS — J449 Chronic obstructive pulmonary disease, unspecified: Secondary | ICD-10-CM | POA: Diagnosis not present

## 2016-06-12 DIAGNOSIS — J449 Chronic obstructive pulmonary disease, unspecified: Secondary | ICD-10-CM | POA: Diagnosis not present

## 2016-07-10 DIAGNOSIS — J449 Chronic obstructive pulmonary disease, unspecified: Secondary | ICD-10-CM | POA: Diagnosis not present

## 2016-07-18 ENCOUNTER — Other Ambulatory Visit: Payer: Self-pay | Admitting: Family Medicine

## 2016-07-25 ENCOUNTER — Encounter: Payer: Self-pay | Admitting: Family Medicine

## 2016-07-25 ENCOUNTER — Ambulatory Visit (INDEPENDENT_AMBULATORY_CARE_PROVIDER_SITE_OTHER): Payer: Medicare HMO | Admitting: Family Medicine

## 2016-07-25 VITALS — BP 130/89 | HR 102 | Temp 98.6°F | Ht 69.0 in | Wt 212.0 lb

## 2016-07-25 DIAGNOSIS — J441 Chronic obstructive pulmonary disease with (acute) exacerbation: Secondary | ICD-10-CM | POA: Diagnosis not present

## 2016-07-25 DIAGNOSIS — E039 Hypothyroidism, unspecified: Secondary | ICD-10-CM | POA: Diagnosis not present

## 2016-07-25 DIAGNOSIS — M5441 Lumbago with sciatica, right side: Secondary | ICD-10-CM | POA: Diagnosis not present

## 2016-07-25 DIAGNOSIS — E785 Hyperlipidemia, unspecified: Secondary | ICD-10-CM | POA: Diagnosis not present

## 2016-07-25 DIAGNOSIS — G8929 Other chronic pain: Secondary | ICD-10-CM | POA: Diagnosis not present

## 2016-07-25 DIAGNOSIS — I10 Essential (primary) hypertension: Secondary | ICD-10-CM

## 2016-07-25 MED ORDER — PREDNISONE 20 MG PO TABS: ORAL_TABLET | ORAL | 0 refills | Status: DC

## 2016-07-25 MED ORDER — LOSARTAN POTASSIUM-HCTZ 50-12.5 MG PO TABS: 1.0000 | ORAL_TABLET | Freq: Every day | ORAL | 1 refills | Status: DC

## 2016-07-25 MED ORDER — MELOXICAM 15 MG PO TABS: 15.0000 mg | ORAL_TABLET | Freq: Every day | ORAL | 1 refills | Status: DC

## 2016-07-25 MED ORDER — OMEPRAZOLE 40 MG PO CPDR: 40.0000 mg | DELAYED_RELEASE_CAPSULE | Freq: Two times a day (BID) | ORAL | 1 refills | Status: DC

## 2016-07-25 MED ORDER — FLUTICASONE PROPIONATE 50 MCG/ACT NA SUSP: 2.0000 | Freq: Two times a day (BID) | NASAL | 3 refills | Status: DC

## 2016-07-25 MED ORDER — TRAMADOL HCL 50 MG PO TABS: 50.0000 mg | ORAL_TABLET | Freq: Two times a day (BID) | ORAL | 2 refills | Status: DC

## 2016-07-25 MED ORDER — FLUTICASONE-UMECLIDIN-VILANT 100-62.5-25 MCG/INH IN AEPB: 1.0000 | INHALATION_SPRAY | Freq: Every day | RESPIRATORY_TRACT | 2 refills | Status: DC

## 2016-07-25 MED ORDER — LEVOTHYROXINE SODIUM 125 MCG PO TABS: 125.0000 ug | ORAL_TABLET | Freq: Every day | ORAL | 3 refills | Status: DC

## 2016-07-25 NOTE — Progress Notes (Signed)
BP 130/89   Pulse (!) 102   Temp 98.6 F (37 C) (Oral)   Ht '5\' 9"'$  (1.753 m)   Wt 212 lb (96.2 kg)   BMI 31.31 kg/m    Subjective:    Patient ID: Antonio Bowen, male    DOB: [birth year], 71 y.o.   MRN: [id number]  HPI: Antonio Bowen is a 71 y.o. male presenting on 07/25/2016 for Annual Exam (would like to have a written prescription for Tramadol for a 90 day supply to send to mail order; also, would like to talk to you about taking Prednisone regularly)   HPI Hypothyroidism recheck Patient is coming in for thyroid recheck today as well. He denies any issues with hair changes or heat or cold problems or diarrhea or constipation. He denies any chest pain or palpitations. He is currently on levothyroxine 125 micrograms.  Hyperlipidemia Patient is coming in for recheck of his hyperlipidemia. He is currently taking no medication because he was intolerant of medication previously. He denies any issues with myalgias or history of liver damage from it. He denies any focal numbness or weakness or chest pain.   COPD recheck Patient is coming in for COPD recheck. He says he is still having daily issues and has to use his albuterol inhaler almost daily. He is currently on Symbicort for maintenance and then albuterol. He says that he gets short of breath anytime he walks even short distances or does anything around the house. He denies any shortness of breath while at rest. He says he feels like he is wheezing frequently on and off throughout the day. He feels like the Symbicort is not working well for him. He denies any fevers or chills. He does have coughing spells frequently off and on.  Hypertension recheck Patient is coming in for hypertension recheck today. His blood pressure today is 130/89. He is currently on losartan-hydrochlorothiazide. Patient denies headaches, blurred vision, chest pains, shortness of breath, or weakness. Denies any side effects from medication and is content with  current medication.   Chronic back pain going down into his buttocks. Patient comes in complaining of chronic low back pain that goes down into the back of his hip and buttocks on the right side. He has been diagnosed with multiple degenerative disc and arthritis in the spine previously but did not want to do surgery. He has done injections previously and has done therapy previously. He is currently using tramadol and meloxicam and says that they are working okay but not controlling completely. He denies any numbness or weakness in that leg. He says the pain has been like this for at least 2 years and has been increasing over the past 2 years. He says the prednisone burst that we did last year helped a lot and he wants to try that again.  Relevant past medical, surgical, family and social history reviewed and updated as indicated. Interim medical history since our last visit reviewed. Allergies and medications reviewed and updated.  Review of Systems  Constitutional: Negative for chills and fever.  HENT: Positive for congestion.   Respiratory: Positive for cough, chest tightness, shortness of breath and wheezing.   Cardiovascular: Negative for chest pain, palpitations and leg swelling.  Musculoskeletal: Positive for arthralgias and back pain. Negative for gait problem.  Skin: Negative for rash.  All other systems reviewed and are negative.   Per HPI unless specifically indicated above   Allergies as of 07/25/2016      Reactions  Nuts Nausea And Vomiting   sweating   Vicodin [hydrocodone-acetaminophen]    tachycardia   Aspirin Other (See Comments)   Upset stomach, bleeding   Other Nausea And Vomiting   Per patient, cannot take any narcotic. Nausea and vomiting   Penicillins Hives, Rash   Has patient had a PCN reaction causing immediate rash, facial/tongue/throat swelling, SOB or lightheadedness with hypotension:No Has patient had a PCN reaction causing severe rash involving mucus  membranes or skin necrosis:No Has patient had a PCN reaction that required hospitalization:No Has patient had a PCN reaction occurring within the last 10 years:Yes If all of the above answers are "NO", then may proceed with Cephalosporin use.      Medication List       Accurate as of 07/25/16  2:50 PM. Always use your most recent med list.          acetaminophen 500 MG tablet Commonly known as:  TYLENOL Take 1,000 mg by mouth 2 (two) times daily as needed (for pain.).   albuterol 108 (90 Base) MCG/ACT inhaler Commonly known as:  VENTOLIN HFA INHALE TWO PUFFS INTO LUNGS EVERY 6 HOURS AS NEEDED FOR WHEEZING   cetirizine 10 MG tablet Commonly known as:  ZYRTEC Take 10 mg by mouth at bedtime.   fluticasone 50 MCG/ACT nasal spray Commonly known as:  FLONASE Place 2 sprays into both nostrils 2 (two) times daily.   Fluticasone-Umeclidin-Vilant 100-62.5-25 MCG/INH Aepb Commonly known as:  TRELEGY ELLIPTA Inhale 1 Dose into the lungs daily.   FLUTTER Devi Use three times daily   LEG CRAMP RELIEF PO Take 2 tablets by mouth at bedtime. Hyland's   levothyroxine 125 MCG tablet Commonly known as:  SYNTHROID, LEVOTHROID Take 1 tablet (125 mcg total) by mouth daily before breakfast.   losartan-hydrochlorothiazide 50-12.5 MG tablet Commonly known as:  HYZAAR Take 1 tablet by mouth daily.   magnesium oxide 400 MG tablet Commonly known as:  MAG-OX Take 400 mg by mouth at bedtime.   meloxicam 15 MG tablet Commonly known as:  MOBIC Take 1 tablet (15 mg total) by mouth daily.   omeprazole 40 MG capsule Commonly known as:  PRILOSEC Take 1 capsule (40 mg total) by mouth 2 (two) times daily.   predniSONE 20 MG tablet Commonly known as:  DELTASONE 2 po at same time daily for 5 days   predniSONE 20 MG tablet Commonly known as:  DELTASONE 2 po at same time daily for 5 days   PRESCRIPTION MEDICATION Oxygen 3.0 liters at night, prn use in day   SUPER B COMPLEX/C PO Take 1  capsule by mouth at bedtime.   SYSTANE BALANCE 0.6 % Soln Generic drug:  Propylene Glycol Place 1-2 drops into both eyes 4 (four) times daily as needed (for dry eyes).   traMADol 50 MG tablet Commonly known as:  ULTRAM Take 1 tablet (50 mg total) by mouth 2 (two) times daily.          Objective:    BP 130/89   Pulse (!) 102   Temp 98.6 F (37 C) (Oral)   Ht '5\' 9"'$  (1.753 m)   Wt 212 lb (96.2 kg)   BMI 31.31 kg/m   Wt Readings from Last 3 Encounters:  07/25/16 212 lb (96.2 kg)  04/02/16 203 lb 4 oz (92.2 kg)  03/26/16 208 lb (94.3 kg)    Physical Exam  Constitutional: He is oriented to person, place, and time. He appears well-developed and well-nourished. No distress.  Eyes:  Conjunctivae are normal. No scleral icterus.  Cardiovascular: Normal rate, regular rhythm, normal heart sounds and intact distal pulses.   No murmur heard. Pulmonary/Chest: Effort normal and breath sounds normal. No respiratory distress. He has no wheezes. He has no rales. He exhibits no tenderness.  Musculoskeletal: Normal range of motion. He exhibits no edema.       Lumbar back: He exhibits tenderness. He exhibits normal range of motion, no bony tenderness, no deformity and normal pulse.       Back:  Neurological: He is alert and oriented to person, place, and time. Coordination normal.  Skin: Skin is warm and dry. No rash noted. He is not diaphoretic.  Psychiatric: He has a normal mood and affect. His behavior is normal.  Nursing note and vitals reviewed.     Assessment & Plan:   Problem List Items Addressed This Visit      Cardiovascular and Mediastinum   Hypertension   Relevant Medications   losartan-hydrochlorothiazide (HYZAAR) 50-12.5 MG tablet   Other Relevant Orders   CMP14+EGFR     Respiratory   Obstructive chronic bronchitis with exacerbation Gold B Copd - Primary   Relevant Medications   fluticasone (FLONASE) 50 MCG/ACT nasal spray   predniSONE (DELTASONE) 20 MG tablet    Fluticasone-Umeclidin-Vilant (TRELEGY ELLIPTA) 100-62.5-25 MCG/INH AEPB   Other Relevant Orders   CBC with Differential/Platelet     Endocrine   Hypothyroidism (Chronic)   Relevant Medications   levothyroxine (SYNTHROID, LEVOTHROID) 125 MCG tablet   Other Relevant Orders   CBC with Differential/Platelet   TSH     Other   Low back pain   Relevant Medications   meloxicam (MOBIC) 15 MG tablet   predniSONE (DELTASONE) 20 MG tablet   traMADol (ULTRAM) 50 MG tablet   Hyperlipidemia LDL goal <130   Relevant Medications   losartan-hydrochlorothiazide (HYZAAR) 50-12.5 MG tablet   Other Relevant Orders   Lipid panel      Gave samples for breo Ellipta and incruse Ellipta and sent prescription for Trilogy  Follow up plan: Return in about 3 months (around 10/25/2016), or if symptoms worsen or fail to improve, for Hypertension and COPD recheck.  Counseling provided for all of the vaccine components Orders Placed This Encounter  Procedures  . CBC with Differential/Platelet  . CMP14+EGFR  . Lipid panel  . TSH    Caryl Pina, MD [facility] 07/25/2016, 2:50 PM

## 2016-07-26 LAB — CMP14+EGFR
ALT: 36 IU/L (ref 0–44)
AST: 28 IU/L (ref 0–40)
Albumin/Globulin Ratio: 1.9 (ref 1.2–2.2)
Albumin: 4.6 g/dL (ref 3.5–4.8)
Alkaline Phosphatase: 77 IU/L (ref 39–117)
BUN/Creatinine Ratio: 9 — ABNORMAL LOW (ref 10–24)
BUN: 14 mg/dL (ref 8–27)
Bilirubin Total: 0.6 mg/dL (ref 0.0–1.2)
CO2: 23 mmol/L (ref 18–29)
Calcium: 9.6 mg/dL (ref 8.6–10.2)
Chloride: 101 mmol/L (ref 96–106)
Creatinine, Ser: 1.61 mg/dL — ABNORMAL HIGH (ref 0.76–1.27)
GFR calc Af Amer: 49 mL/min/{1.73_m2} — ABNORMAL LOW (ref >59)
GFR calc non Af Amer: 43 mL/min/{1.73_m2} — ABNORMAL LOW (ref >59)
Globulin, Total: 2.4 g/dL (ref 1.5–4.5)
Glucose: 88 mg/dL (ref 65–99)
Potassium: 4.1 mmol/L (ref 3.5–5.2)
Sodium: 142 mmol/L (ref 134–144)
Total Protein: 7 g/dL (ref 6.0–8.5)

## 2016-07-26 LAB — CBC WITH DIFFERENTIAL/PLATELET
Basophils Absolute: 0 10*3/uL (ref 0.0–0.2)
Basos: 0 %
EOS (ABSOLUTE): 0.1 10*3/uL (ref 0.0–0.4)
Eos: 1 %
Hematocrit: 48.2 % (ref 37.5–51.0)
Hemoglobin: 16.9 g/dL (ref 13.0–17.7)
Immature Grans (Abs): 0.1 10*3/uL (ref 0.0–0.1)
Immature Granulocytes: 1 %
Lymphocytes Absolute: 4.5 10*3/uL — ABNORMAL HIGH (ref 0.7–3.1)
Lymphs: 34 %
MCH: 32.8 pg (ref 26.6–33.0)
MCHC: 35.1 g/dL (ref 31.5–35.7)
MCV: 93 fL (ref 79–97)
Monocytes Absolute: 0.9 10*3/uL (ref 0.1–0.9)
Monocytes: 7 %
Neutrophils Absolute: 7.4 10*3/uL — ABNORMAL HIGH (ref 1.4–7.0)
Neutrophils: 57 %
Platelets: 188 10*3/uL (ref 150–379)
RBC: 5.16 x10E6/uL (ref 4.14–5.80)
RDW: 13.7 % (ref 12.3–15.4)
WBC: 13 10*3/uL — ABNORMAL HIGH (ref 3.4–10.8)

## 2016-07-26 LAB — LIPID PANEL
Chol/HDL Ratio: 2.2 ratio units (ref 0.0–5.0)
Cholesterol, Total: 104 mg/dL (ref 100–199)
HDL: 47 mg/dL (ref >39)
LDL Calculated: 31 mg/dL (ref 0–99)
Triglycerides: 129 mg/dL (ref 0–149)
VLDL Cholesterol Cal: 26 mg/dL (ref 5–40)

## 2016-07-26 LAB — TSH: TSH: 1.61 u[IU]/mL (ref 0.450–4.500)

## 2016-08-10 DIAGNOSIS — J449 Chronic obstructive pulmonary disease, unspecified: Secondary | ICD-10-CM | POA: Diagnosis not present

## 2016-09-09 DIAGNOSIS — J449 Chronic obstructive pulmonary disease, unspecified: Secondary | ICD-10-CM | POA: Diagnosis not present

## 2016-10-10 DIAGNOSIS — J449 Chronic obstructive pulmonary disease, unspecified: Secondary | ICD-10-CM | POA: Diagnosis not present

## 2016-11-09 DIAGNOSIS — J449 Chronic obstructive pulmonary disease, unspecified: Secondary | ICD-10-CM | POA: Diagnosis not present

## 2016-12-10 DIAGNOSIS — J449 Chronic obstructive pulmonary disease, unspecified: Secondary | ICD-10-CM | POA: Diagnosis not present

## 2016-12-28 IMAGING — CR DG CHEST 2V
2 series · 2 of 2 positions shown · non-contrast
Comparison: 08/26/2014

CLINICAL DATA: Dizziness and weakness

EXAM:
CHEST - 2 VIEW

[chest pa]
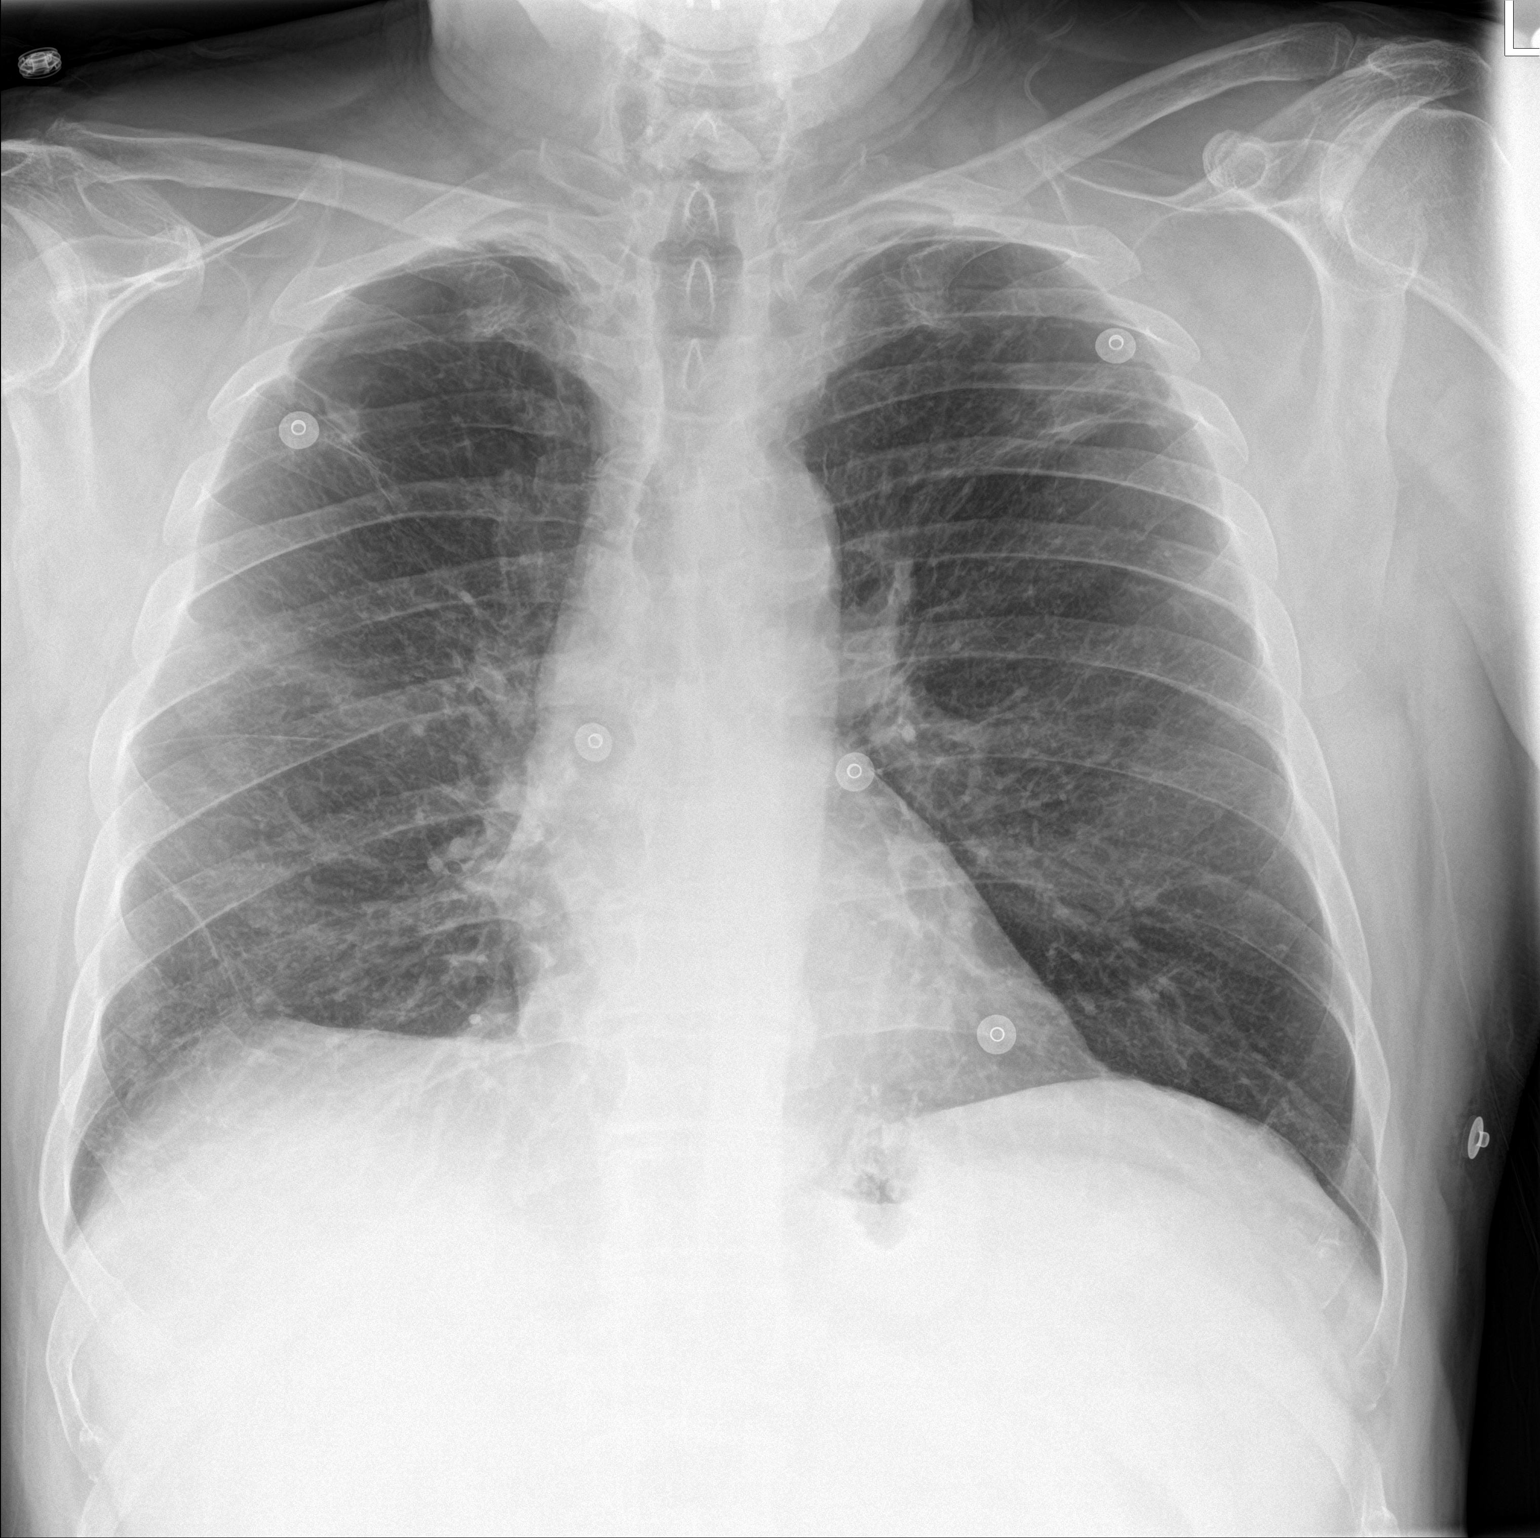

[chest lat]
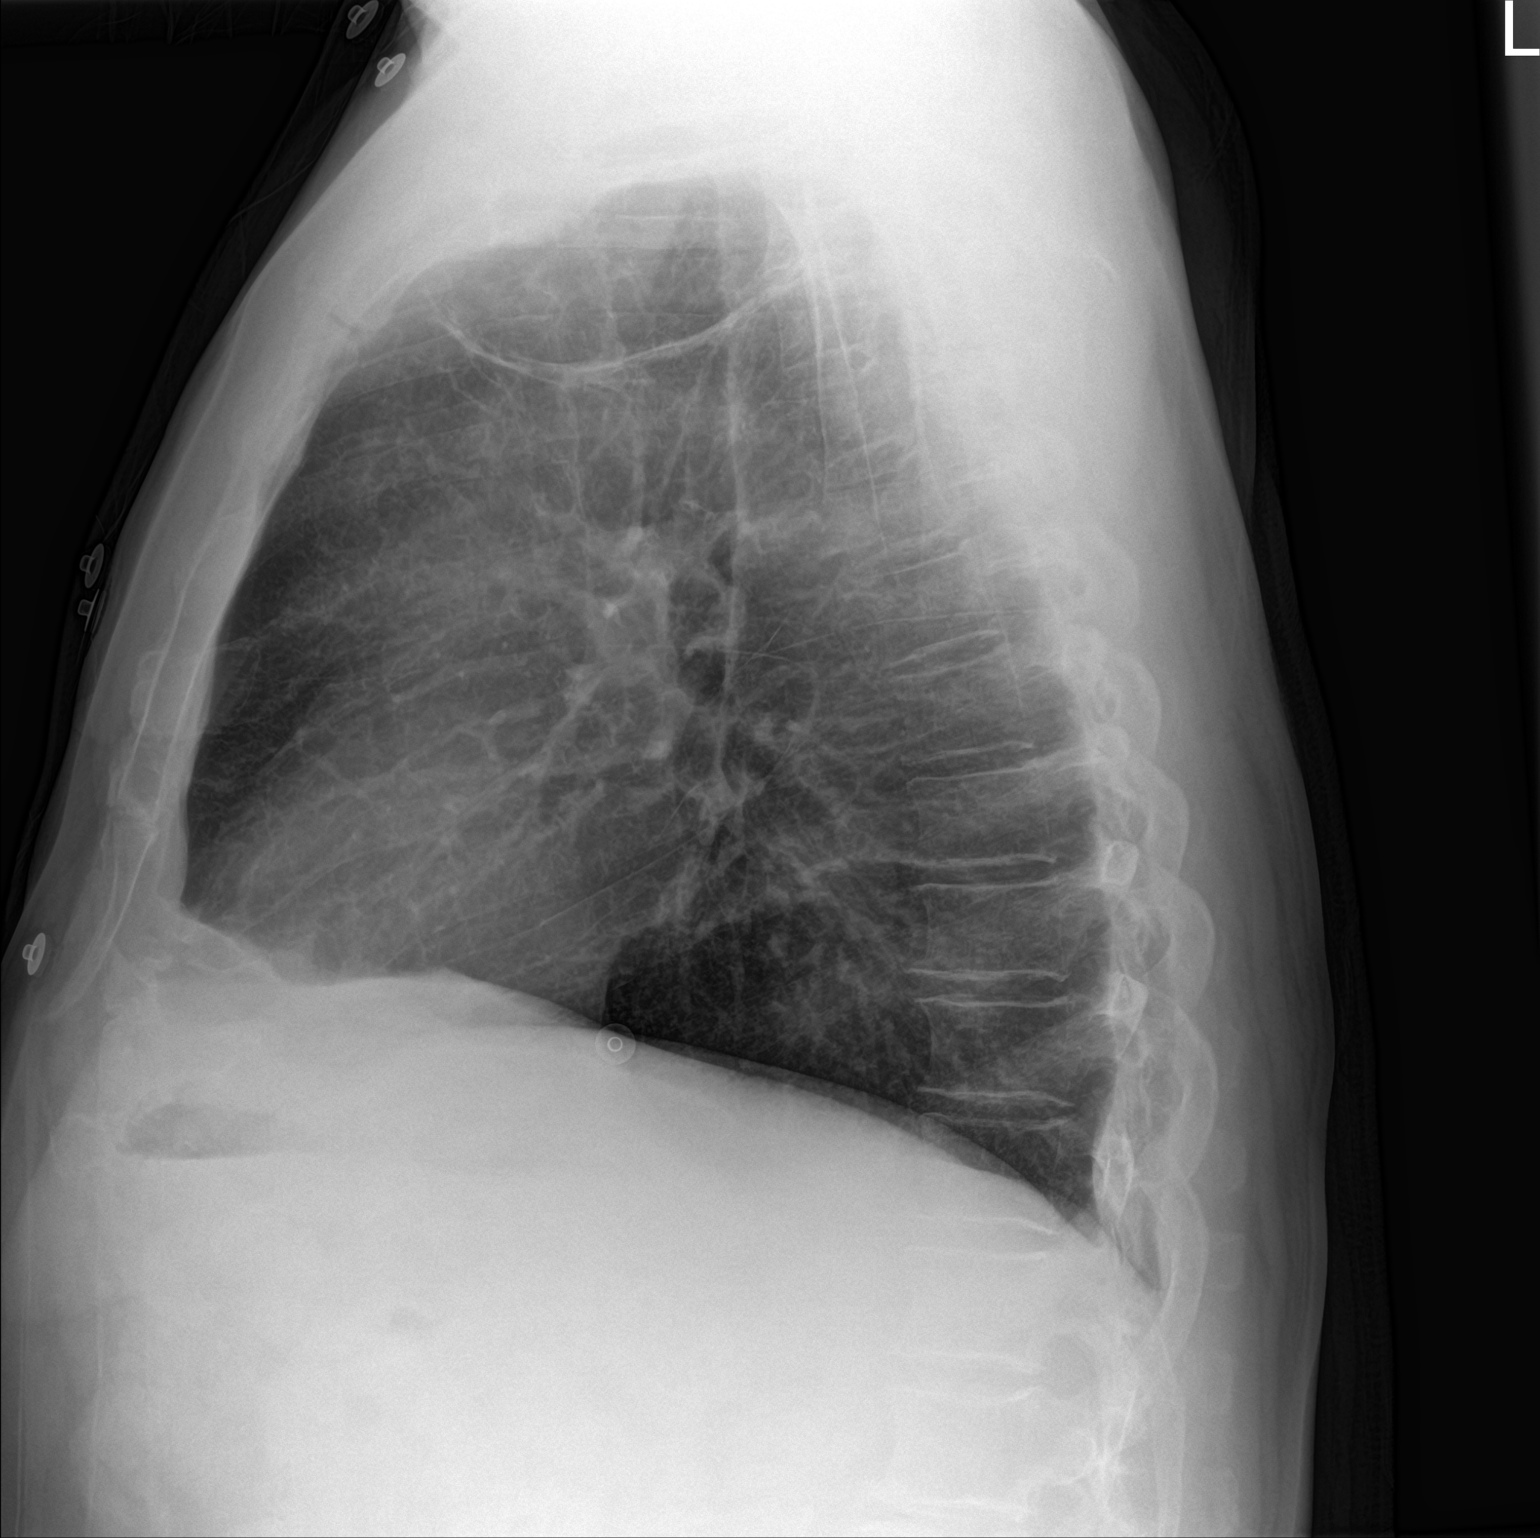

[2 of 2 positions shown; findings below may reference images not displayed]

FINDINGS: Cardiac shadow is stable. The lungs are well aerated bilaterally.
Stable scarring is noted in the upper lobes bilaterally. No new
focal infiltrate or sizable effusion is seen. Bullous changes are
noted in the apices as well stable from the prior exam. No acute
bony abnormality is noted.
IMPRESSION: Chronic changes without acute abnormality.

## 2017-01-10 DIAGNOSIS — J449 Chronic obstructive pulmonary disease, unspecified: Secondary | ICD-10-CM | POA: Diagnosis not present

## 2017-01-15 ENCOUNTER — Other Ambulatory Visit: Payer: Self-pay | Admitting: Family Medicine

## 2017-01-16 ENCOUNTER — Other Ambulatory Visit: Payer: Self-pay

## 2017-01-21 ENCOUNTER — Other Ambulatory Visit: Payer: Self-pay | Admitting: Family Medicine

## 2017-02-09 DIAGNOSIS — J449 Chronic obstructive pulmonary disease, unspecified: Secondary | ICD-10-CM | POA: Diagnosis not present

## 2017-02-13 ENCOUNTER — Other Ambulatory Visit: Payer: Self-pay | Admitting: Family Medicine

## 2017-03-07 ENCOUNTER — Encounter: Payer: Self-pay | Admitting: Family Medicine

## 2017-03-07 ENCOUNTER — Ambulatory Visit: Payer: Medicare HMO | Admitting: Family Medicine

## 2017-03-07 ENCOUNTER — Other Ambulatory Visit: Payer: Self-pay | Admitting: Family Medicine

## 2017-03-07 VITALS — BP 133/81 | HR 75 | Temp 98.1°F | Ht 69.0 in | Wt 210.0 lb

## 2017-03-07 DIAGNOSIS — I10 Essential (primary) hypertension: Secondary | ICD-10-CM

## 2017-03-07 DIAGNOSIS — J441 Chronic obstructive pulmonary disease with (acute) exacerbation: Secondary | ICD-10-CM

## 2017-03-07 DIAGNOSIS — Z1159 Encounter for screening for other viral diseases: Secondary | ICD-10-CM | POA: Diagnosis not present

## 2017-03-07 DIAGNOSIS — E039 Hypothyroidism, unspecified: Secondary | ICD-10-CM | POA: Diagnosis not present

## 2017-03-07 DIAGNOSIS — K219 Gastro-esophageal reflux disease without esophagitis: Secondary | ICD-10-CM | POA: Diagnosis not present

## 2017-03-07 DIAGNOSIS — Z23 Encounter for immunization: Secondary | ICD-10-CM

## 2017-03-07 DIAGNOSIS — E785 Hyperlipidemia, unspecified: Secondary | ICD-10-CM

## 2017-03-07 MED ORDER — OMEPRAZOLE 40 MG PO CPDR: DELAYED_RELEASE_CAPSULE | ORAL | 6 refills | Status: DC

## 2017-03-07 MED ORDER — FLUTICASONE PROPIONATE 50 MCG/ACT NA SUSP: 2.0000 | Freq: Every day | NASAL | 3 refills | Status: DC

## 2017-03-07 MED ORDER — TRAMADOL HCL 50 MG PO TABS: 50.0000 mg | ORAL_TABLET | Freq: Two times a day (BID) | ORAL | 0 refills | Status: DC

## 2017-03-07 MED ORDER — PREDNISONE 20 MG PO TABS: ORAL_TABLET | ORAL | 0 refills | Status: DC

## 2017-03-07 MED ORDER — LOSARTAN POTASSIUM-HCTZ 50-12.5 MG PO TABS: 1.0000 | ORAL_TABLET | Freq: Every day | ORAL | 1 refills | Status: DC

## 2017-03-07 MED ORDER — TIOTROPIUM BROMIDE MONOHYDRATE 18 MCG IN CAPS: 18.0000 ug | ORAL_CAPSULE | Freq: Every day | RESPIRATORY_TRACT | 12 refills | Status: DC

## 2017-03-07 MED ORDER — MELOXICAM 15 MG PO TABS: 15.0000 mg | ORAL_TABLET | Freq: Every day | ORAL | 1 refills | Status: DC

## 2017-03-07 MED ORDER — BUDESONIDE-FORMOTEROL FUMARATE 160-4.5 MCG/ACT IN AERO: 2.0000 | INHALATION_SPRAY | Freq: Two times a day (BID) | RESPIRATORY_TRACT | 3 refills | Status: DC

## 2017-03-07 MED ORDER — LEVOTHYROXINE SODIUM 125 MCG PO TABS: 125.0000 ug | ORAL_TABLET | Freq: Every day | ORAL | 1 refills | Status: DC

## 2017-03-07 MED ORDER — ALBUTEROL SULFATE HFA 108 (90 BASE) MCG/ACT IN AERS: INHALATION_SPRAY | RESPIRATORY_TRACT | 4 refills | Status: DC

## 2017-03-07 NOTE — Progress Notes (Signed)
BP 133/81   Pulse 75   Temp 98.1 F (36.7 C) (Oral)   Ht '5\' 9"'$  (1.753 m)   Wt 210 lb (95.3 kg)   BMI 31.01 kg/m    Subjective:    Patient ID: Antonio Bowen, male    DOB: 1945-05-08, 71 y.o.   MRN: [id number]  HPI: Antonio Bowen is a 71 y.o. male presenting on 03/07/2017 for Medication refills (all meds, would also to have a round of prednisone for his breathing) and Discuss Trelegy (caused mouth to break out, would rather stay on Symbicort)   HPI GERD Patient is currently on omeprazole 40.  She denies any major symptoms or abdominal pain or belching or burping. She denies any blood in her stool or lightheadedness or dizziness.   Hyperlipidemia Patient is coming in for recheck of his hyperlipidemia. The patient is currently taking diet controlled currently, we are just monitoring for now and that has not been too bad, will continue to monitor. They deny any issues with myalgias or history of liver damage from it. They deny any focal numbness or weakness or chest pain.   Hypertension Patient is currently on losartan-hydrochlorothiazide, and their blood pressure today is 133/81. Patient denies any lightheadedness or dizziness. Patient denies headaches, blurred vision, chest pains, shortness of breath, or weakness. Denies any side effects from medication and is content with current medication.   Hypothyroidism recheck Patient is coming in for thyroid recheck today as well. They deny any issues with hair changes or heat or cold problems or diarrhea or constipation. They deny any chest pain or palpitations. They are currently on levothyroxine 163mcrograms   COPD The patient is coming in for a COPD recheck today.  He says that his COPD has been worse and he tried doing the trilogy and feels like he was getting a lot of sores in his mouth and irritation and he could not do it.  He would like to go back to his Symbicort and Spiriva.  He still says he is having issues with his breathing  every day and wheezing every day and he has been using his albuterol inhaler every day.  He says it comes and goes and is not initially worse during the day or at night but is especially worse on exertion.  He denies any shortness of breath or fevers or chills but mostly just the wheezing and tightness of his chest.  He denies any cough currently   Relevant past medical, surgical, family and social history reviewed and updated as indicated. Interim medical history since our last visit reviewed. Allergies and medications reviewed and updated.  Review of Systems  Constitutional: Negative for chills and fever.  HENT: Positive for congestion, postnasal drip, rhinorrhea, sinus pressure, sneezing and sore throat. Negative for ear discharge, ear pain and voice change.   Eyes: Negative for pain, discharge, redness and visual disturbance.  Respiratory: Positive for cough, chest tightness and wheezing. Negative for shortness of breath.   Cardiovascular: Negative for chest pain and leg swelling.  Gastrointestinal: Negative for abdominal distention, abdominal pain, nausea and vomiting.  Musculoskeletal: Negative for gait problem.  Skin: Negative for rash.  All other systems reviewed and are negative.   Per HPI unless specifically indicated above     Objective:    BP 133/81   Pulse 75   Temp 98.1 F (36.7 C) (Oral)   Ht '5\' 9"'$  (1.753 m)   Wt 210 lb (95.3 kg)   BMI 31.01 kg/m  Wt Readings from Last 3 Encounters:  03/07/17 210 lb (95.3 kg)  07/25/16 212 lb (96.2 kg)  04/02/16 203 lb 4 oz (92.2 kg)    Physical Exam  Constitutional: He is oriented to person, place, and time. He appears well-developed and well-nourished. No distress.  Eyes: Conjunctivae are normal. No scleral icterus.  Neck: Neck supple. No thyromegaly present.  Cardiovascular: Normal rate, regular rhythm, normal heart sounds and intact distal pulses.  No murmur heard. Pulmonary/Chest: Effort normal and breath sounds normal.  No respiratory distress. He has no wheezes. He has no rales.  Abdominal: Soft. Bowel sounds are normal. He exhibits no distension. There is no tenderness. There is no rebound.  Musculoskeletal: Normal range of motion. He exhibits no edema.  Lymphadenopathy:    He has no cervical adenopathy.  Neurological: He is alert and oriented to person, place, and time. Coordination normal.  Skin: Skin is warm and dry. No rash noted. He is not diaphoretic.  Psychiatric: He has a normal mood and affect. His behavior is normal.  Nursing note and vitals reviewed.     Assessment & Plan:   Problem List Items Addressed This Visit      Cardiovascular and Mediastinum   Hypertension   Relevant Orders   CMP14+EGFR     Respiratory   Obstructive chronic bronchitis with exacerbation Gold B Copd - Primary   Relevant Medications   budesonide-formoterol (SYMBICORT) 160-4.5 MCG/ACT inhaler   tiotropium (SPIRIVA HANDIHALER) 18 MCG inhalation capsule   predniSONE (DELTASONE) 20 MG tablet   Other Relevant Orders   CBC with Differential/Platelet     Digestive   GERD (gastroesophageal reflux disease)   Relevant Orders   CBC with Differential/Platelet     Endocrine   Hypothyroidism (Chronic)   Relevant Orders   TSH     Other   Hyperlipidemia LDL goal <130   Relevant Orders   Lipid panel    Other Visit Diagnoses    Need for hepatitis C screening test       Relevant Orders   Hepatitis C antibody       Follow up plan: Return in about 6 months (around 09/04/2017), or if symptoms worsen or fail to improve, for COPD and hypertension and cholesterol.  Counseling provided for all of the vaccine components Orders Placed This Encounter  Procedures  . CMP14+EGFR  . CBC with Differential/Platelet  . Lipid panel  . Hepatitis C antibody  . TSH    Caryl Pina, MD [facility] 03/07/2017, 2:34 PM

## 2017-03-08 LAB — CBC WITH DIFFERENTIAL/PLATELET
Basophils Absolute: 0 10*3/uL (ref 0.0–0.2)
Basos: 0 %
EOS (ABSOLUTE): 0.1 10*3/uL (ref 0.0–0.4)
Eos: 1 %
Hematocrit: 48.6 % (ref 37.5–51.0)
Hemoglobin: 16.8 g/dL (ref 13.0–17.7)
Immature Grans (Abs): 0 10*3/uL (ref 0.0–0.1)
Immature Granulocytes: 0 %
Lymphocytes Absolute: 3.6 10*3/uL — ABNORMAL HIGH (ref 0.7–3.1)
Lymphs: 37 %
MCH: 32.4 pg (ref 26.6–33.0)
MCHC: 34.6 g/dL (ref 31.5–35.7)
MCV: 94 fL (ref 79–97)
Monocytes Absolute: 0.7 10*3/uL (ref 0.1–0.9)
Monocytes: 7 %
Neutrophils Absolute: 5.4 10*3/uL (ref 1.4–7.0)
Neutrophils: 55 %
Platelets: 205 10*3/uL (ref 150–379)
RBC: 5.18 x10E6/uL (ref 4.14–5.80)
RDW: 13.6 % (ref 12.3–15.4)
WBC: 9.9 10*3/uL (ref 3.4–10.8)

## 2017-03-08 LAB — CMP14+EGFR
ALT: 25 IU/L (ref 0–44)
AST: 25 IU/L (ref 0–40)
Albumin/Globulin Ratio: 1.8 (ref 1.2–2.2)
Albumin: 4.2 g/dL (ref 3.5–4.8)
Alkaline Phosphatase: 74 IU/L (ref 39–117)
BUN/Creatinine Ratio: 9 — ABNORMAL LOW (ref 10–24)
BUN: 15 mg/dL (ref 8–27)
Bilirubin Total: 0.4 mg/dL (ref 0.0–1.2)
CO2: 22 mmol/L (ref 20–29)
Calcium: 9.1 mg/dL (ref 8.6–10.2)
Chloride: 105 mmol/L (ref 96–106)
Creatinine, Ser: 1.62 mg/dL — ABNORMAL HIGH (ref 0.76–1.27)
GFR calc Af Amer: 49 mL/min/{1.73_m2} — ABNORMAL LOW (ref >59)
GFR calc non Af Amer: 42 mL/min/{1.73_m2} — ABNORMAL LOW (ref >59)
Globulin, Total: 2.3 g/dL (ref 1.5–4.5)
Glucose: 83 mg/dL (ref 65–99)
Potassium: 4.1 mmol/L (ref 3.5–5.2)
Sodium: 144 mmol/L (ref 134–144)
Total Protein: 6.5 g/dL (ref 6.0–8.5)

## 2017-03-08 LAB — LIPID PANEL
Chol/HDL Ratio: 2.1 ratio (ref 0.0–5.0)
Cholesterol, Total: 92 mg/dL — ABNORMAL LOW (ref 100–199)
HDL: 44 mg/dL (ref >39)
LDL Calculated: 18 mg/dL (ref 0–99)
Triglycerides: 151 mg/dL — ABNORMAL HIGH (ref 0–149)
VLDL Cholesterol Cal: 30 mg/dL (ref 5–40)

## 2017-03-08 LAB — TSH: TSH: 1.9 u[IU]/mL (ref 0.450–4.500)

## 2017-03-08 LAB — HEPATITIS C ANTIBODY: Hep C Virus Ab: <0.1 s/co ratio (ref 0.0–0.9)

## 2017-03-12 DIAGNOSIS — J449 Chronic obstructive pulmonary disease, unspecified: Secondary | ICD-10-CM | POA: Diagnosis not present

## 2017-04-11 DIAGNOSIS — J449 Chronic obstructive pulmonary disease, unspecified: Secondary | ICD-10-CM | POA: Diagnosis not present

## 2017-05-12 DIAGNOSIS — J449 Chronic obstructive pulmonary disease, unspecified: Secondary | ICD-10-CM | POA: Diagnosis not present

## 2017-05-24 ENCOUNTER — Other Ambulatory Visit: Payer: Self-pay

## 2017-05-24 ENCOUNTER — Encounter: Payer: Self-pay | Admitting: Family Medicine

## 2017-05-24 ENCOUNTER — Ambulatory Visit (INDEPENDENT_AMBULATORY_CARE_PROVIDER_SITE_OTHER): Payer: Medicare HMO | Admitting: Family Medicine

## 2017-05-24 VITALS — BP 138/87 | HR 76 | Temp 98.7°F | Ht 69.0 in | Wt 207.0 lb

## 2017-05-24 DIAGNOSIS — G5622 Lesion of ulnar nerve, left upper limb: Secondary | ICD-10-CM

## 2017-05-24 MED ORDER — PREDNISONE 20 MG PO TABS: ORAL_TABLET | ORAL | 0 refills | Status: DC

## 2017-05-24 MED ORDER — METHYLPREDNISOLONE ACETATE 80 MG/ML IJ SUSP
80.0000 mg | Freq: Once | INTRAMUSCULAR | Status: AC
  Administered 2017-05-24: 80 mg via INTRAMUSCULAR

## 2017-05-24 NOTE — Progress Notes (Signed)
BP 138/87   Pulse 76   Temp 98.7 F (37.1 C) (Oral)   Ht 5\' 9"  (1.753 m)   Wt 207 lb (93.9 kg)   BMI 30.57 kg/m    Subjective:    Patient ID: Antonio Bowen, male    DOB: Jul 02, 1945, 72 y.o.   MRN: [id number]  HPI: Antonio Bowen is a 72 y.o. male presenting on 05/24/2017 for Pain in left elbow (radiates into upper and lower arm, 4th and 5th digits numb, radiates into shoulder blade, side)   HPI Patient presents to the clinic with left elbow pain that radiates both up and down entire arm (shoulder down to 4th and 5th fingers) x 1 week.  Patient describes pain as "sticking ice pick" in his elbow. He is rating the pain a 10/10. Patient has tried Tramadol, Tylenol, Prednisone (he had 1 left over from a COPD exacerbation and split it in half), ice, and heat which mildly relieved pain for a short period of time. Patient states that the pain was so severe last night that he felt hot, nauseous, and sweaty. Patient admits to numbness/tingling in 4th and 5th left fingers. Patient denies a history of injury, swelling, fever, and chills. Patient is not having any problems moving left arm, but it does cause mild pain. Patient had ulnar nerve surgery in left arm in March 2018.  Relevant past medical, surgical, family and social history reviewed and updated as indicated. Interim medical history since our last visit reviewed. Allergies and medications reviewed and updated.  Review of Systems  Constitutional: Negative for chills, diaphoresis, fatigue and fever.  Respiratory: Negative for shortness of breath.   Cardiovascular: Negative for chest pain.  Musculoskeletal: Positive for arthralgias and myalgias. Negative for joint swelling.       Left elbow pain radiates to shoulder and fingers  Neurological: Positive for numbness (4th and 5th left fingers). Negative for weakness.    Per HPI unless specifically indicated above        Objective:    BP 138/87   Pulse 76   Temp 98.7 F (37.1 C)  (Oral)   Ht 5\' 9"  (1.753 m)   Wt 207 lb (93.9 kg)   BMI 30.57 kg/m   Wt Readings from Last 3 Encounters:  05/24/17 207 lb (93.9 kg)  03/07/17 210 lb (95.3 kg)  07/25/16 212 lb (96.2 kg)    Physical Exam  Constitutional: He is oriented to person, place, and time. He appears well-developed and well-nourished. No distress.  Cardiovascular: Normal rate, regular rhythm, normal heart sounds and intact distal pulses. Exam reveals no gallop and no friction rub.  No murmur heard. Pulmonary/Chest: Effort normal and breath sounds normal. No respiratory distress. He has no wheezes. He has no rales.  Musculoskeletal:       Left shoulder: He exhibits tenderness and pain. He exhibits normal range of motion, no swelling, no deformity, normal pulse and normal strength.       Left elbow: He exhibits normal range of motion, no swelling, no effusion and no deformity. Tenderness found. Medial epicondyle and olecranon process tenderness noted.       Left forearm: He exhibits no swelling and no edema.       Left hand: He exhibits normal range of motion, no tenderness, normal capillary refill and no swelling. Decreased sensation noted. Decreased sensation is present in the ulnar distribution. Decreased sensation is not present in the medial redistribution and is not present in the radial distribution. Normal  strength noted.  Neurological: He is alert and oriented to person, place, and time.  Psychiatric: He has a normal mood and affect. His behavior is normal.    Left tennis elbow injection: Consent form signed. Risk factors of bleeding and infection discussed with patient and patient is agreeable towards injection. Patient prepped with Betadine. Lateral approach towards injection used. Injected 80 mg of Depo-Medrol and 1 mL of 2% lidocaine. Patient tolerated procedure well and no side effects from noted. Minimal to no bleeding. Simple bandage applied after.     Assessment & Plan:   Problem List Items Addressed  This Visit    None    Visit Diagnoses    Ulnar nerve entrapment syndrome, left    -  Primary   Relevant Medications   predniSONE (DELTASONE) 20 MG tablet   methylPREDNISolone acetate (DEPO-MEDROL) injection 80 mg    Patient presents to the clinic with signs and symptoms that are indicative of ulnar nerve entrapment syndrome such as pain in left elbow that radiates up to the shoulder and down to the 4th and 5th fingers. Patient has numbness/tingling in 4th and 5th left fingers. On physical exam, patient had tenderness to palpation around the olecranon and medial epicondyle. Both sensation and pulses were intact in left arm. Patient was given a Depo-Medrol 80mg  injection in left elbow at clinic today. Patient will be prescribed Deltasone 20mg  for his left elbow pain. Patient has been instructed to call the clinic if the pain gets worse or does not improve after the steroid treatment. Patient may be referred to Orthopedics if pain worsens.   Follow up plan: Return if symptoms worsen or fail to improve.  Counseling provided for all of the vaccine components No orders of the defined types were placed in this encounter.  Patient seen and examined with Lanna Poche PA student.  Agree with assessment and plan above. Caryl Pina, MD [facility] 05/28/2017, 2:28 PM

## 2017-05-25 ENCOUNTER — Telehealth: Payer: Self-pay | Admitting: Family Medicine

## 2017-05-25 NOTE — Telephone Encounter (Signed)
Prednisone was called to [facility].  [facility] was closed so left message for patient to call them Monday to cancel prednisone script.

## 2017-05-25 NOTE — Telephone Encounter (Signed)
Left message, ask [facility] to check voice mail for prednisone script, please.

## 2017-05-27 ENCOUNTER — Telehealth: Payer: Self-pay | Admitting: Family Medicine

## 2017-05-27 DIAGNOSIS — G5622 Lesion of ulnar nerve, left upper limb: Secondary | ICD-10-CM

## 2017-05-27 NOTE — Telephone Encounter (Signed)
Patient's pain in his elbow/arm has not improved at all, he is now having numbness in his hand also.  Would like to be referred to [facility].  Please advise.

## 2017-05-27 NOTE — Telephone Encounter (Signed)
Please inform the patient that I have sent him a referral for orthopedic and he would get a call from [place] as soon as it is placed.

## 2017-05-28 NOTE — Telephone Encounter (Signed)
Patient aware.

## 2017-05-29 ENCOUNTER — Telehealth: Payer: Self-pay | Admitting: Family Medicine

## 2017-05-29 NOTE — Telephone Encounter (Signed)
Patient aware referral is being worked on.

## 2017-05-30 NOTE — Telephone Encounter (Signed)
Antonio Bowen,  I spoke with this pt : After he spoke with Janett Billow yesterday - he called [facility] himself and tried to get an appt - they told him that he would need records from 3-4 yrs ago from DR Arlis Porta ([place])  He needs to know what to do. Can you help him / call him please?  He is in a lot of pain.

## 2017-05-31 NOTE — Telephone Encounter (Signed)
Pt aware that information was sent to [facility] on 05/30/17.

## 2017-06-05 DIAGNOSIS — M25522 Pain in left elbow: Secondary | ICD-10-CM | POA: Insufficient documentation

## 2017-06-05 DIAGNOSIS — M7712 Lateral epicondylitis, left elbow: Secondary | ICD-10-CM | POA: Diagnosis not present

## 2017-06-05 DIAGNOSIS — M720 Palmar fascial fibromatosis [Dupuytren]: Secondary | ICD-10-CM | POA: Diagnosis not present

## 2017-06-12 ENCOUNTER — Telehealth: Payer: Self-pay | Admitting: Internal Medicine

## 2017-06-12 ENCOUNTER — Telehealth: Payer: Self-pay | Admitting: *Deleted

## 2017-06-12 DIAGNOSIS — J449 Chronic obstructive pulmonary disease, unspecified: Secondary | ICD-10-CM | POA: Diagnosis not present

## 2017-06-12 NOTE — Telephone Encounter (Signed)
Pt called to schedule appt for dysphagia Pt has hx of this Pt instructed to call GI Pt verbalizes understanding

## 2017-06-12 NOTE — Telephone Encounter (Signed)
Pt states he can swallow but he does not want to due to the pain. States this started 3 days ago but he thought it would get better. Pt states milk seems to feel the best when he tries to swallow it. Pt scheduled to see Tye Savoy NP tomorrow at 3pm. Pt aware of appt.

## 2017-06-13 ENCOUNTER — Encounter: Payer: Self-pay | Admitting: Nurse Practitioner

## 2017-06-13 ENCOUNTER — Ambulatory Visit: Payer: Medicare HMO | Admitting: Nurse Practitioner

## 2017-06-13 VITALS — BP 122/68 | HR 76 | Ht 69.0 in | Wt 200.0 lb

## 2017-06-13 DIAGNOSIS — R1310 Dysphagia, unspecified: Secondary | ICD-10-CM

## 2017-06-13 MED ORDER — FLUCONAZOLE 100 MG PO TABS: 100.0000 mg | ORAL_TABLET | Freq: Every day | ORAL | 0 refills | Status: DC

## 2017-06-13 MED ORDER — LIDOCAINE VISCOUS 2 % MT SOLN: 15.0000 mL | OROMUCOSAL | 0 refills | Status: DC | PRN

## 2017-06-13 NOTE — Progress Notes (Signed)
      IMPRESSION and PLAN:    72 yo male with acute onset of severe odynophagia. He has noticed white coating on tongue. Sx followed course of prednisone. I suspect he has candida esophagitis. Viral etiology possible. Pill esophagitis possible but no recent problems swallowing food nor pills.  -lidocaine viscous prn -course of diflucan -continue PPI -Call us with condition update early next week. If doesn't improve with above measures then will need further workup    HPI:    Chief Complaint: painful swallowing   Patient is a 72 yo male known to Dr. Henrene Pastor for a hx of GERD. In 2014 he was evaluated for dysphagia, EGD revealed an esophageal ring which was dilated. He represented in 2017 with recurrent dysphagia, repeat EGD remarkable for moderate benign appearing stenosis, dilated again. Patient presents today with acute stabbing pain over Xiphoid. Pain mainly when swallowing food, fluid. No chest pain, SOB.  Weight down 8 pounds since pain started. Recently got steroid injection in hip followed by prednisone burst.  He hasn't had any further problems with dysphagia since last dilation. No problems swallowing pills leading up to sx to suggest pill esophagitis.  Patient does admit to having some white coating on tongue and around mouth late.  Past Medical History:  Diagnosis Date  . Allergic rhinitis   . Arthritis   . BOOP (bronchiolitis obliterans with organizing pneumonia) ([place])   . Bronchitis   . Colon polyps   . COPD (chronic obstructive pulmonary disease) ([place])   . Dyspnea    with exertion   . Dysrhythmia    skipped beats- followed by Dr Angelena Form   . GERD (gastroesophageal reflux disease)   . Hypertension   . Hypothyroidism   . Kidney stone   . Low back pain   . Oxygen dependent    uses 3L/[place] at nite occasional during day if needed   . Stroke [facility])    hx of 2 strokes which patient did not know he had had   . Thyroid disease   . Tobacco dependence     Patient's surgical  history, family medical history, social history, medications and allergies were all reviewed in Epic    Review of systems: No fevers, no SOB   Physical Exam:     BP 122/68   Pulse 76   Ht 5\' 9"  (1.753 m)   Wt 200 lb (90.7 kg)   BMI 29.53 kg/m   GENERAL:  Pleasant male in NAD PSYCH: :Pleasant, cooperative, normal affect EENT:  conjunctiva pink, mucous membranes moist, scant white coating on tongue. Neck supple without masses CARDIAC:  RRR, no peripheral edema PULM: Normal respiratory effort, lungs CTA bilaterally, no wheezing ABDOMEN:  Nondistended, soft, nontender. No obvious masses, no hepatomegaly,  normal bowel sounds SKIN:  turgor, no lesions seen Musculoskeletal:  Normal muscle tone, normal strength NEURO: Alert and oriented x 3, no focal neurologic deficits   Tye Savoy , NP 06/13/2017, 3:33 PM

## 2017-06-13 NOTE — Patient Instructions (Signed)
If you are age 72 or older, your body mass index should be between 23-30. Your Body mass index is 29.53 kg/m. If this is out of the aforementioned range listed, please consider follow up with your Primary Care Provider.  If you are age 76 or younger, your body mass index should be between 19-25. Your Body mass index is 29.53 kg/m. If this is out of the aformentioned range listed, please consider follow up with your Primary Care Provider.   We have sent the following medications to your pharmacy for you to pick up at your convenience: Viscous Lidocaine Solution Diflucan  Call on Wednesday with an update.  Thank you for choosing me and [facility].   Tye Savoy, NP

## 2017-06-18 ENCOUNTER — Encounter: Payer: Self-pay | Admitting: Nurse Practitioner

## 2017-06-19 ENCOUNTER — Telehealth: Payer: Self-pay | Admitting: Nurse Practitioner

## 2017-06-19 NOTE — Progress Notes (Signed)
Assessment and plans review 

## 2017-06-21 NOTE — Telephone Encounter (Signed)
Great news.

## 2017-06-28 ENCOUNTER — Ambulatory Visit (INDEPENDENT_AMBULATORY_CARE_PROVIDER_SITE_OTHER): Payer: Medicare HMO

## 2017-06-28 ENCOUNTER — Ambulatory Visit (INDEPENDENT_AMBULATORY_CARE_PROVIDER_SITE_OTHER): Payer: Medicare HMO | Admitting: Family Medicine

## 2017-06-28 ENCOUNTER — Encounter: Payer: Self-pay | Admitting: Family Medicine

## 2017-06-28 VITALS — BP 125/79 | HR 90 | Temp 98.1°F | Ht 69.0 in | Wt 205.0 lb

## 2017-06-28 DIAGNOSIS — S79921A Unspecified injury of right thigh, initial encounter: Secondary | ICD-10-CM | POA: Diagnosis not present

## 2017-06-28 DIAGNOSIS — S76111A Strain of right quadriceps muscle, fascia and tendon, initial encounter: Secondary | ICD-10-CM | POA: Diagnosis not present

## 2017-06-28 DIAGNOSIS — M79651 Pain in right thigh: Secondary | ICD-10-CM

## 2017-06-28 MED ORDER — LEVOTHYROXINE SODIUM 125 MCG PO TABS: 125.0000 ug | ORAL_TABLET | Freq: Every day | ORAL | 1 refills | Status: DC

## 2017-06-28 MED ORDER — BACLOFEN 10 MG PO TABS: 10.0000 mg | ORAL_TABLET | Freq: Three times a day (TID) | ORAL | 0 refills | Status: DC

## 2017-06-28 MED ORDER — FLUTICASONE PROPIONATE 50 MCG/ACT NA SUSP: 2.0000 | Freq: Two times a day (BID) | NASAL | 6 refills | Status: DC

## 2017-06-28 MED ORDER — LOSARTAN POTASSIUM-HCTZ 50-12.5 MG PO TABS: 1.0000 | ORAL_TABLET | Freq: Every day | ORAL | 1 refills | Status: DC

## 2017-06-28 MED ORDER — MELOXICAM 15 MG PO TABS: 15.0000 mg | ORAL_TABLET | Freq: Every day | ORAL | 1 refills | Status: DC

## 2017-06-28 NOTE — Progress Notes (Signed)
BP 125/79   Pulse 90   Temp 98.1 F (36.7 C) (Oral)   Ht 5\' 9"  (1.753 m)   Wt 205 lb (93 kg)   BMI 30.27 kg/m    Subjective:    Patient ID: Antonio Bowen, male    DOB: 1946/03/10, 72 y.o.   MRN: [id number]  HPI: Antonio Bowen is a 72 y.o. male presenting on 06/28/2017 for Right leg pain   HPI Pt presents today with right leg pain x 3 weeks. Occurred when he took a step back at home and he felt the leg "give out". He thought it was a cramp at first, but it was very severe. He was too weak to get up and his wife had to come and get him help. He has been using a walker for several days. The next day his knee and leg were swollen and his upper thigh was very bruised. Still remains bruised today. He had a hip replacement in 2014 and is concerned if the rod in his leg has broken. He also describes the lower part of his leg as "numb" and has a loss of sensation. He has been taking Mobic, Tramadol, and Tylenol, which have not been helping with the pain. He does feel the injury has improved over the last 3 weeks and the bruising is resolving as well. He rates the initial pain as a 10/10 but now it is an 8/10.  Relevant past medical, surgical, family and social history reviewed and updated as indicated. Interim medical history since our last visit reviewed. Allergies and medications reviewed and updated.  Review of Systems  Constitutional: Negative for chills and fever.  HENT: Negative for congestion, ear pain, rhinorrhea, sneezing and sore throat.   Eyes: Negative for pain and discharge.  Respiratory: Negative for cough and shortness of breath.   Musculoskeletal: Positive for arthralgias, gait problem, joint swelling (right knee) and myalgias (right inner thigh; also often gets leg cramps). Negative for back pain.  Skin: Positive for color change (bruising). Negative for rash and wound.  Neurological: Positive for numbness (mild numbness in lower right leg from knee below). Negative for  dizziness, weakness and light-headedness.    Per HPI unless specifically indicated above        Objective:    BP 125/79   Pulse 90   Temp 98.1 F (36.7 C) (Oral)   Ht 5\' 9"  (1.753 m)   Wt 205 lb (93 kg)   BMI 30.27 kg/m   Wt Readings from Last 3 Encounters:  06/28/17 205 lb (93 kg)  06/13/17 200 lb (90.7 kg)  05/24/17 207 lb (93.9 kg)    Physical Exam  Constitutional: He is oriented to person, place, and time. He appears well-developed and well-nourished.  Cardiovascular: Normal rate and regular rhythm.  Pulmonary/Chest: Effort normal and breath sounds normal.  Musculoskeletal: Normal range of motion.       Right hip: He exhibits normal range of motion, normal strength, no tenderness, no bony tenderness and no swelling.       Left hip: Normal.       Right knee: He exhibits bony tenderness (mild tenderness over proximal tibia). He exhibits normal range of motion, no swelling, no effusion, no deformity, no LCL laxity, normal meniscus and no MCL laxity. Tenderness (mild) found.       Left knee: Normal.       Legs: Neurological: He is alert and oriented to person, place, and time.  Skin: Skin is warm  and intact. Bruising (over right thigh) noted. No abrasion and no rash noted. No erythema.  Nursing note and vitals reviewed.     XR: No acute bony abnormalities, await final read from radiology    Assessment & Plan:   Problem List Items Addressed This Visit    None    Visit Diagnoses    Quadriceps muscle strain, right, initial encounter    -  Primary   Relevant Medications   baclofen (LIORESAL) 10 MG tablet   Right thigh pain       Relevant Medications   baclofen (LIORESAL) 10 MG tablet   Other Relevant Orders   DG FEMUR, MIN 2 VIEWS RIGHT (Completed)      Order Baclofen for muscle pain and quad muscle strain.  Discussed quad muscle strengthening excercises and printed off educational handout.  Follow up plan: Return if symptoms worsen or fail to  improve.  Counseling provided for all of the vaccine components Orders Placed This Encounter  Procedures  . DG FEMUR, MIN 2 VIEWS RIGHT     Patient was seen and examined with Chaney Malling PA student, agree with assessment and plan above. Caryl Pina, MD [facility] 07/03/2017, 10:24 PM

## 2017-07-03 ENCOUNTER — Telehealth: Payer: Self-pay | Admitting: *Deleted

## 2017-07-03 DIAGNOSIS — R202 Paresthesia of skin: Secondary | ICD-10-CM | POA: Diagnosis not present

## 2017-07-03 DIAGNOSIS — M720 Palmar fascial fibromatosis [Dupuytren]: Secondary | ICD-10-CM | POA: Diagnosis not present

## 2017-07-03 DIAGNOSIS — M7712 Lateral epicondylitis, left elbow: Secondary | ICD-10-CM | POA: Diagnosis not present

## 2017-07-03 DIAGNOSIS — M25522 Pain in left elbow: Secondary | ICD-10-CM | POA: Diagnosis not present

## 2017-07-03 DIAGNOSIS — M7710 Lateral epicondylitis, unspecified elbow: Secondary | ICD-10-CM | POA: Insufficient documentation

## 2017-07-03 MED ORDER — FLUTICASONE PROPIONATE 50 MCG/ACT NA SUSP: 1.0000 | Freq: Two times a day (BID) | NASAL | 3 refills | Status: DC

## 2017-07-03 NOTE — Telephone Encounter (Signed)
Fax received [facility] clarification on fluticason 50 mcg spr Max recommended dosage of 200 mcg/day Per Dr. Warrick Parisian fax response & new Rx sent

## 2017-07-10 DIAGNOSIS — J449 Chronic obstructive pulmonary disease, unspecified: Secondary | ICD-10-CM | POA: Diagnosis not present

## 2017-08-10 DIAGNOSIS — J449 Chronic obstructive pulmonary disease, unspecified: Secondary | ICD-10-CM | POA: Diagnosis not present

## 2017-08-13 ENCOUNTER — Encounter: Payer: Self-pay | Admitting: *Deleted

## 2017-08-13 ENCOUNTER — Ambulatory Visit (INDEPENDENT_AMBULATORY_CARE_PROVIDER_SITE_OTHER): Payer: Medicare HMO | Admitting: *Deleted

## 2017-08-13 VITALS — BP 135/83 | HR 72 | Ht 67.5 in | Wt 204.0 lb

## 2017-08-13 DIAGNOSIS — Z0000 Encounter for general adult medical examination without abnormal findings: Secondary | ICD-10-CM

## 2017-08-13 DIAGNOSIS — Z1211 Encounter for screening for malignant neoplasm of colon: Secondary | ICD-10-CM

## 2017-08-13 MED ORDER — FLUTICASONE PROPIONATE 50 MCG/ACT NA SUSP: 2.0000 | Freq: Two times a day (BID) | NASAL | 3 refills | Status: DC

## 2017-08-13 NOTE — Progress Notes (Addendum)
Subjective:   Antonio Bowen is a 72 y.o. male who presents for an Initial Medicare Annual Wellness Visit. Antonio Bowen is married and lives at home with his wife. He is retired from the [employer]. He enjoys playing golf but has not been able to this year due to joint pain. He is disappointed that he isn't able to. He has two adult children and three grandchildren. He does stay busy around his home and yard as much as he can.   Review of Systems  Overall health is about the same as last year.  Musc: multiple joint pains and right quad strain  Cardiac Risk Factors include: advanced age (>44men, >8 women);male gender;sedentary lifestyle;hypertension  Urinary: Urinary frequency and urgency due to enlarged prostate. . Usually gets up once during night.   Objective:    Today's Vitals   08/13/17 1517 08/13/17 1518  BP: 135/83   Pulse: 72   Weight: 204 lb (92.5 kg)   Height: 5' 7.5" (1.715 m)   PainSc:  4    Body mass index is 31.48 kg/m.  Advanced Directives 08/13/2017 03/26/2016 02/18/2015 01/14/2014 08/06/2013  Does Patient Have a Medical Advance Directive? No No No No Patient does not have advance directive  Would patient like information on creating a medical advance directive? Yes (MAU/Ambulatory/Procedural Areas - Information given) - No - patient declined information - -  Pre-existing out of facility DNR order (yellow Bowen or pink MOST Bowen) - - - - No    Current Medications (verified) Outpatient Encounter Medications as of 08/13/2017  Medication Sig  . acetaminophen (TYLENOL) 500 MG tablet Take 1,000 mg by mouth 2 (two) times daily as needed (for pain.).  Marland Kitchen albuterol (VENTOLIN HFA) 108 (90 Base) MCG/ACT inhaler INHALE TWO PUFFS INTO LUNGS EVERY 6 HOURS AS NEEDED FOR WHEEZING  . budesonide-formoterol (SYMBICORT) 160-4.5 MCG/ACT inhaler Inhale 2 puffs 2 (two) times daily into the lungs.  . cetirizine (ZYRTEC) 10 MG tablet Take 10 mg by mouth at bedtime.   . fluticasone (FLONASE) 50  MCG/ACT nasal spray Place 2 sprays into both nostrils 2 (two) times daily.  . Homeopathic Products (LEG CRAMP RELIEF PO) Take 2 tablets by mouth at bedtime. Hyland's  . levothyroxine (SYNTHROID, LEVOTHROID) 125 MCG tablet Take 1 tablet (125 mcg total) by mouth daily before breakfast.  . losartan-hydrochlorothiazide (HYZAAR) 50-12.5 MG tablet Take 1 tablet by mouth daily.  . magnesium oxide (MAG-OX) 400 MG tablet Take 400 mg by mouth at bedtime.   . meloxicam (MOBIC) 15 MG tablet Take 1 tablet (15 mg total) by mouth daily.  Marland Kitchen omeprazole (PRILOSEC) 40 MG capsule TAKE 1 CAPSULE TWICE DAILY  ( APPOINTMENT IS NEEDED  )  . PRESCRIPTION MEDICATION Oxygen 3.0 liters at night, prn use in day  . Respiratory Therapy Supplies (FLUTTER) DEVI Use three times daily  . SUPER B COMPLEX/C PO Take 1 capsule by mouth at bedtime.  . traMADol (ULTRAM) 50 MG tablet Take 1 tablet (50 mg total) 2 (two) times daily by mouth.  . [DISCONTINUED] baclofen (LIORESAL) 10 MG tablet Take 1 tablet (10 mg total) by mouth 3 (three) times daily.  . [DISCONTINUED] fluticasone (FLONASE) 50 MCG/ACT nasal spray Place 1 spray into both nostrils 2 (two) times daily.   No facility-administered encounter medications on file as of 08/13/2017.     Allergies (verified) Nuts; Vicodin [hydrocodone-acetaminophen]; Codeine; Aspirin; Other; and Penicillins   History: Past Medical History:  Diagnosis Date  . Allergic rhinitis   . Arthritis   .  BOOP (bronchiolitis obliterans with organizing pneumonia) ([place])   . Bronchitis   . Colon polyps   . COPD (chronic obstructive pulmonary disease) ([place])   . Dyspnea    with exertion   . Dysrhythmia    skipped beats- followed by Antonio Bowen   . GERD (gastroesophageal reflux disease)   . Hypertension   . Hypothyroidism   . Kidney stone   . Low back pain   . Oxygen dependent    uses 3L/[place] at nite occasional during day if needed   . Stroke [facility])    hx of 2 strokes which patient did not know he had  had   . Thyroid disease   . Tobacco dependence    Past Surgical History:  Procedure Laterality Date  . broncoscopy     . carpel tunnal     left  . COLONOSCOPY     x 4  . DUPUYTREN CONTRACTURE RELEASE     Left  . ESOPHAGOGASTRODUODENOSCOPY (EGD) WITH PROPOFOL N/A 03/26/2016   Procedure: ESOPHAGOGASTRODUODENOSCOPY (EGD) WITH PROPOFOL;  Surgeon: Antonio Shipper, MD;  Location: [facility];  Service: Endoscopy;  Laterality: N/A;  . ESOPHAGOGASTRODUODENOSCOPY ENDOSCOPY     x 3  . FACIAL FRACTURE SURGERY    . OTHER SURGICAL HISTORY     facial surgery  . TOTAL HIP ARTHROPLASTY  01/2011   Right hip, Antonio Bowen  . ULNAR NERVE TRANSPOSITION     left   Family History  Problem Relation Age of Onset  . Arthritis Father   . Stroke Father 82  . Cancer Maternal Grandfather   . Prostate cancer Maternal Grandfather   . Stomach cancer Maternal Grandmother   . Cancer Paternal Grandmother   . Colon cancer Unknown        mat 1st cousin  . Cancer Mother   . Healthy Daughter   . Healthy Son    Social History   Socioeconomic History  . Marital status: Married    Spouse name: Not on file  . Number of children: 2  . Years of education: 61  . Highest education level: 11th grade  Occupational History  . 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 Financial resource strain: Not hard at all  . Food insecurity:    Worry: Never true    Inability: Never true  . Transportation needs:    Medical: No    Non-medical: No  Tobacco Use  . Smoking status: Former Smoker    Packs/day: 1.00    Years: 50.00    Pack years: 50.00    Types: Cigarettes    Last attempt to quit: 07/30/2010    Years since quitting: 7.0  . Smokeless tobacco: Current User    Types: Snuff  Substance and Sexual Activity  . Alcohol use: Yes    Comment: rare  . Drug use: No  . Sexual activity: Not on file  Lifestyle  . Physical activity:    Days per week: 7  days    Minutes per session: 30 min  . Stress: Not at all  Relationships  . Social connections:    Talks on phone: More than three times a week    Gets together: More than three times a week    Attends religious service: Never    Active member of club or organization: No    Attends meetings of clubs or organizations: Never    Relationship status: Married  Other Topics Concern  . Not on file  Social History Narrative  . Not on file   Tobacco Counseling Ready to quit: Not Answered Counseling given: Not Answered Uses dip. Is not ready to quit.  Clinical Intake:     Pain : 0-10 Pain Score: 4  Pain Type: Chronic pain Pain Location: Back(back, right hip) Pain Orientation: Right Pain Descriptors / Indicators: Aching Pain Onset: More than a month ago Pain Frequency: Constant Effect of Pain on Daily Activities: minimal-pushes through the pain but it does affect how he wants to do things     Nutritional Status: BMI 25 -29 Overweight Diabetes: No  How often do you need to have someone help you when you read instructions, pamphlets, or other written materials from your doctor or pharmacy?: 1 - Never What is the last grade level you completed in school?: 11  Interpreter Needed?: No  Information entered by :: [name], RN  Activities of Daily Living In your present state of health, do you have any difficulty performing the following activities: 08/13/2017  Hearing? Y  Comment Has some hearing loss. Has had hearing evaluation in the past.   Vision? N  Comment Last eye exam was December 2018.  Difficulty concentrating or making decisions? Y  Comment has noticed some decrease in short term memory. Sometimes he does things that don't make sense but he realizes it. He may go to put something in the microwave and open the refrigerator  Walking or climbing stairs? Y  Comment multiple joint pain and left thigh strain  Dressing or bathing? N  Doing errands, shopping? N    Preparing Food and eating ? N  Using the Toilet? N  In the past six months, have you accidently leaked urine? N  Do you have problems with loss of bowel control? N  Managing your Medications? N  Managing your Finances? N  Housekeeping or managing your Housekeeping? N  Some recent data might be hidden     Immunizations and Health Maintenance Immunization History  Administered Date(s) Administered  . Influenza Split 01/24/2012  . Influenza,inj,Quad PF,6+ Mos 03/09/2013, 01/14/2014, 02/21/2015, 02/09/2016, 03/07/2017  . Pneumococcal Conjugate-13 01/14/2014  . Pneumococcal Polysaccharide-23 01/24/2012  . Tdap 01/05/2011  . Zoster 01/05/2011   Health Maintenance Due  Topic Date Due  . COLONOSCOPY  07/09/2017    Patient Care Team: [name], [name], MD as PCP - General (Family Medicine) Elsie Stain, MD (Pulmonary Disease) [facility]   No hospitalizations, ER visits, or surgeries this past year.      Assessment:   This is a routine wellness examination for Spencer.  Hearing/Vision screen Hearing deficit noted. Eye exam is up to date   Dietary issues and exercise activities discussed: Current Exercise Habits: Home exercise routine(stays busy at home and hard and he does have an exercise routine), Type of exercise: Other - see comments, Time (Minutes): 30, Frequency (Times/Week): 5, Weekly Exercise (Minutes/Week): 150, Intensity: Mild, Exercise limited by: orthopedic condition(s)  Goals    . Exercise 150 min/wk Moderate Activity     Maintain current activity level. Increase as tolerated once your joints and leg are feeling better      Depression Screen PHQ 2/9 Scores 08/13/2017 06/28/2017 05/24/2017 03/07/2017  PHQ - 2 Score 0 0 0 0    Fall Risk Fall Risk  08/13/2017 06/28/2017 05/24/2017 03/07/2017 07/25/2016  Falls in the past year? Yes No No No No  Comment reaching for something that was out of reach - - - -  Number falls in past yr: 1 - - - -  Injury with  Fall? Yes - - - -  Comment scraped left elbow, bruised left side of ribs and right lower leg - - - -  Risk Factor Category  High Fall Risk - - - -  Risk for fall due to : History of fall(s) - - - -  Follow up Falls prevention discussed - - - -   Cognitive Function: MMSE - Mini Mental State Exam 08/13/2017  Orientation to time 5  Orientation to Place 5  Registration 3  Attention/ Calculation 5  Recall 3  Language- name 2 objects 2  Language- repeat 1  Language- follow 3 step command 3  Language- read & follow direction 1  Write a sentence 1  Copy design 0  Total score 29        Screening Tests Health Maintenance  Topic Date Due  . COLONOSCOPY  07/09/2017  . INFLUENZA VACCINE  11/28/2017  . TETANUS/TDAP  01/04/2021  . Hepatitis C Screening  Completed  . PNA vac Low Risk Adult  Completed       Plan:  Order for colonoscopy placed Update TD if dirty injury Breo samples given since no samples of symbicort. Rinse mouth out.  Return signed/notarized copy of Advance directives Keep f/u with PCP  I have personally reviewed and noted the following in the patient's chart:   . Medical and social history . Use of alcohol, tobacco or illicit drugs  . Current medications and supplements . Functional ability and status . Nutritional status . Physical activity . Advanced directives . List of other physicians . Hospitalizations, surgeries, and ER visits in previous 12 months . Vitals . Screenings to include cognitive, depression, and falls . Referrals and appointments  In addition, I have reviewed and discussed with patient certain preventive protocols, quality metrics, and best practice recommendations. A written personalized care plan for preventive services as well as general preventive health recommendations were provided to patient.     [name], RN   08/13/2017      I have reviewed and agree with the above AWV documentation.   Evelina Dun, FNP

## 2017-08-13 NOTE — Patient Instructions (Signed)
  Antonio Bowen , Thank you for taking time to come for your Medicare Wellness Visit. I appreciate your ongoing commitment to your health goals. Please review the following plan we discussed and let me know if I can assist you in the future.   These are the goals we discussed: Goals    . Exercise 150 min/wk Moderate Activity     Maintain current activity level. Increase as tolerated once your joints and leg are feeling better       This is a list of the screening recommended for you and due dates:  Health Maintenance  Topic Date Due  . Colon Cancer Screening  07/09/2017  . Flu Shot  11/28/2017  . Tetanus Vaccine  01/04/2021  .  Hepatitis C: One time screening is recommended by Center for Disease Control  (CDC) for  adults born from [birth year] through 1965.   Completed  . Pneumonia vaccines  Completed

## 2017-08-15 ENCOUNTER — Encounter: Payer: Self-pay | Admitting: Internal Medicine

## 2017-08-19 ENCOUNTER — Other Ambulatory Visit: Payer: Self-pay | Admitting: Family Medicine

## 2017-08-19 DIAGNOSIS — M720 Palmar fascial fibromatosis [Dupuytren]: Secondary | ICD-10-CM | POA: Diagnosis not present

## 2017-08-19 DIAGNOSIS — M25522 Pain in left elbow: Secondary | ICD-10-CM | POA: Diagnosis not present

## 2017-08-19 DIAGNOSIS — M7712 Lateral epicondylitis, left elbow: Secondary | ICD-10-CM | POA: Diagnosis not present

## 2017-08-19 MED ORDER — FLUTICASONE PROPIONATE 50 MCG/ACT NA SUSP: 1.0000 | Freq: Two times a day (BID) | NASAL | 3 refills | Status: DC | PRN

## 2017-09-09 DIAGNOSIS — J449 Chronic obstructive pulmonary disease, unspecified: Secondary | ICD-10-CM | POA: Diagnosis not present

## 2017-10-10 DIAGNOSIS — J449 Chronic obstructive pulmonary disease, unspecified: Secondary | ICD-10-CM | POA: Diagnosis not present

## 2017-11-09 DIAGNOSIS — J449 Chronic obstructive pulmonary disease, unspecified: Secondary | ICD-10-CM | POA: Diagnosis not present

## 2017-12-10 DIAGNOSIS — J449 Chronic obstructive pulmonary disease, unspecified: Secondary | ICD-10-CM | POA: Diagnosis not present

## 2018-01-10 DIAGNOSIS — J449 Chronic obstructive pulmonary disease, unspecified: Secondary | ICD-10-CM | POA: Diagnosis not present

## 2018-01-29 ENCOUNTER — Other Ambulatory Visit: Payer: Self-pay | Admitting: Family Medicine

## 2018-01-30 ENCOUNTER — Ambulatory Visit (INDEPENDENT_AMBULATORY_CARE_PROVIDER_SITE_OTHER): Payer: Medicare HMO | Admitting: Family Medicine

## 2018-01-30 ENCOUNTER — Encounter: Payer: Self-pay | Admitting: Family Medicine

## 2018-01-30 VITALS — BP 127/78 | HR 77 | Temp 97.8°F | Ht 67.5 in | Wt 203.6 lb

## 2018-01-30 DIAGNOSIS — J441 Chronic obstructive pulmonary disease with (acute) exacerbation: Secondary | ICD-10-CM

## 2018-01-30 DIAGNOSIS — K219 Gastro-esophageal reflux disease without esophagitis: Secondary | ICD-10-CM | POA: Diagnosis not present

## 2018-01-30 DIAGNOSIS — E039 Hypothyroidism, unspecified: Secondary | ICD-10-CM | POA: Diagnosis not present

## 2018-01-30 DIAGNOSIS — I10 Essential (primary) hypertension: Secondary | ICD-10-CM

## 2018-01-30 DIAGNOSIS — E785 Hyperlipidemia, unspecified: Secondary | ICD-10-CM | POA: Diagnosis not present

## 2018-01-30 DIAGNOSIS — Z9981 Dependence on supplemental oxygen: Secondary | ICD-10-CM

## 2018-01-30 DIAGNOSIS — Z23 Encounter for immunization: Secondary | ICD-10-CM

## 2018-01-30 MED ORDER — OMEPRAZOLE 40 MG PO CPDR: DELAYED_RELEASE_CAPSULE | ORAL | 6 refills | Status: DC

## 2018-01-30 MED ORDER — TRAMADOL HCL 50 MG PO TABS: 50.0000 mg | ORAL_TABLET | Freq: Two times a day (BID) | ORAL | 2 refills | Status: DC

## 2018-01-30 MED ORDER — MELOXICAM 15 MG PO TABS: 15.0000 mg | ORAL_TABLET | Freq: Every day | ORAL | 1 refills | Status: DC

## 2018-01-30 MED ORDER — FLUTICASONE PROPIONATE 50 MCG/ACT NA SUSP: 1.0000 | Freq: Two times a day (BID) | NASAL | 3 refills | Status: DC | PRN

## 2018-01-30 MED ORDER — LOSARTAN POTASSIUM-HCTZ 50-12.5 MG PO TABS: 1.0000 | ORAL_TABLET | Freq: Every day | ORAL | 3 refills | Status: DC

## 2018-01-30 MED ORDER — BACLOFEN 10 MG PO TABS: 10.0000 mg | ORAL_TABLET | Freq: Three times a day (TID) | ORAL | 0 refills | Status: DC

## 2018-01-30 MED ORDER — LEVOTHYROXINE SODIUM 125 MCG PO TABS: 125.0000 ug | ORAL_TABLET | Freq: Every day | ORAL | 3 refills | Status: DC

## 2018-01-30 MED ORDER — TIOTROPIUM BROMIDE MONOHYDRATE 18 MCG IN CAPS: 18.0000 ug | ORAL_CAPSULE | Freq: Every day | RESPIRATORY_TRACT | 3 refills | Status: DC

## 2018-01-30 MED ORDER — ALBUTEROL SULFATE HFA 108 (90 BASE) MCG/ACT IN AERS: INHALATION_SPRAY | RESPIRATORY_TRACT | 4 refills | Status: DC

## 2018-01-30 MED ORDER — BUDESONIDE-FORMOTEROL FUMARATE 160-4.5 MCG/ACT IN AERO: 2.0000 | INHALATION_SPRAY | Freq: Two times a day (BID) | RESPIRATORY_TRACT | 3 refills | Status: DC

## 2018-01-30 NOTE — Progress Notes (Signed)
BP 127/78   Pulse 77   Temp 97.8 F (36.6 C) (Oral)   Ht 5' 7.5" (1.715 m)   Wt 203 lb 9.6 oz (92.4 kg)   SpO2 95%   BMI 31.42 kg/m    Subjective:    Patient ID: Antonio Bowen, male    DOB: 1946-01-21, 72 y.o.   MRN: [id number]  HPI: Antonio Bowen is a 72 y.o. male presenting on 01/30/2018 for check up on chronic medical conditions (Would like to talk about getting portable oxygen. ) and Spasms (Patient states that it has been going on 2 times per week)   HPI Hypertension Patient is currently on lisinopril-hydrochlorothiazide, and their blood pressure today is 127/78. Patient denies any lightheadedness or dizziness. Patient denies headaches, blurred vision, chest pains, shortness of breath, or weakness. Denies any side effects from medication and is content with current medication.   Hypothyroidism recheck Patient is coming in for thyroid recheck today as well. They deny any issues with hair changes or heat or cold problems or diarrhea or constipation. They deny any chest pain or palpitations. They are currently on levothyroxine 128mcrograms   Hyperlipidemia Patient is coming in for recheck of his hyperlipidemia. The patient is currently taking no medication currently and we are just monitoring. They deny any issues with myalgias or history of liver damage from it. They deny any focal numbness or weakness or chest pain.   GERD Patient is currently on omeprazole.  She denies any major symptoms or abdominal pain or belching or burping. She denies any blood in her stool or lightheadedness or dizziness.   COPD Patient is coming in for COPD recheck today.  He is currently on Symbicort and Spiriva.  He has a mild chronic cough but denies any major coughing spells or wheezing spells.  He has 5nighttime symptoms per week and 2daytime symptoms per week currently.  Patient is currently on 2 L nasal cannula which he uses about half of the time but especially has to use it every night.   Patient does have shortness of breath if he does not use his oxygen when he needs it.  Relevant past medical, surgical, family and social history reviewed and updated as indicated. Interim medical history since our last visit reviewed. Allergies and medications reviewed and updated.  Review of Systems  Constitutional: Negative for chills and fever.  Eyes: Negative for visual disturbance.  Respiratory: Positive for cough, shortness of breath and wheezing.   Cardiovascular: Negative for chest pain, palpitations and leg swelling.  Musculoskeletal: Negative for back pain and gait problem.  Skin: Negative for rash.  Neurological: Negative for dizziness, weakness, light-headedness and numbness.  All other systems reviewed and are negative.   Per HPI unless specifically indicated above   Allergies as of 01/30/2018      Reactions   Nuts Nausea And Vomiting   sweating   Vicodin [hydrocodone-acetaminophen]    tachycardia   Codeine    Aspirin Other (See Comments)   Upset stomach, bleeding   Other Nausea And Vomiting   Per patient, cannot take any narcotic. Nausea and vomiting   Penicillins Hives, Rash   Has patient had a PCN reaction causing immediate rash, facial/tongue/throat swelling, SOB or lightheadedness with hypotension:No Has patient had a PCN reaction causing severe rash involving mucus membranes or skin necrosis:No Has patient had a PCN reaction that required hospitalization:No Has patient had a PCN reaction occurring within the last 10 years:Yes If all of the above answers are "  NO", then may proceed with Cephalosporin use.      Medication List        Accurate as of 01/30/18  1:46 PM. Always use your most recent med list.          acetaminophen 500 MG tablet Commonly known as:  TYLENOL Take 1,000 mg by mouth 2 (two) times daily as needed (for pain.).   albuterol 108 (90 Base) MCG/ACT inhaler Commonly known as:  PROVENTIL HFA;VENTOLIN HFA INHALE TWO PUFFS INTO LUNGS  EVERY 6 HOURS AS NEEDED FOR WHEEZING   baclofen 10 MG tablet Commonly known as:  LIORESAL Take 1 tablet (10 mg total) by mouth 3 (three) times daily.   budesonide-formoterol 160-4.5 MCG/ACT inhaler Commonly known as:  SYMBICORT Inhale 2 puffs into the lungs 2 (two) times daily.   cetirizine 10 MG tablet Commonly known as:  ZYRTEC Take 10 mg by mouth at bedtime.   fluticasone 50 MCG/ACT nasal spray Commonly known as:  FLONASE Place 1 spray into both nostrils 2 (two) times daily as needed for allergies or rhinitis.   FLUTTER Devi Use three times daily   LEG CRAMP RELIEF PO Take 2 tablets by mouth at bedtime. Hyland's   levothyroxine 125 MCG tablet Commonly known as:  SYNTHROID, LEVOTHROID Take 1 tablet (125 mcg total) by mouth daily before breakfast.   losartan-hydrochlorothiazide 50-12.5 MG tablet Commonly known as:  HYZAAR Take 1 tablet by mouth daily.   magnesium oxide 400 MG tablet Commonly known as:  MAG-OX Take 400 mg by mouth at bedtime.   meloxicam 15 MG tablet Commonly known as:  MOBIC Take 1 tablet (15 mg total) by mouth daily.   omeprazole 40 MG capsule Commonly known as:  PRILOSEC TAKE 1 CAPSULE TWICE DAILY  ( APPOINTMENT IS NEEDED  )   PRESCRIPTION MEDICATION Oxygen 3.0 liters at night, prn use in day   SUPER B COMPLEX/C PO Take 1 capsule by mouth at bedtime.   tiotropium 18 MCG inhalation capsule Commonly known as:  SPIRIVA Place 1 capsule (18 mcg total) into inhaler and inhale daily.   traMADol 50 MG tablet Commonly known as:  ULTRAM Take 1 tablet (50 mg total) by mouth 2 (two) times daily.          Objective:    BP 127/78   Pulse 77   Temp 97.8 F (36.6 C) (Oral)   Ht 5' 7.5" (1.715 m)   Wt 203 lb 9.6 oz (92.4 kg)   SpO2 95%   BMI 31.42 kg/m   Wt Readings from Last 3 Encounters:  01/30/18 203 lb 9.6 oz (92.4 kg)  08/13/17 204 lb (92.5 kg)  06/28/17 205 lb (93 kg)    Physical Exam  Constitutional: He is oriented to person,  place, and time. He appears well-developed and well-nourished. No distress.  Eyes: Pupils are equal, round, and reactive to light. Conjunctivae and EOM are normal. No scleral icterus.  Neck: Neck supple. No thyromegaly present.  Cardiovascular: Normal rate, regular rhythm, normal heart sounds and intact distal pulses.  No murmur heard. Pulmonary/Chest: Effort normal. No respiratory distress. He has wheezes (And expiratory wheezes in upper lobes bilaterally). He has no rales.  Musculoskeletal: Normal range of motion. He exhibits no edema.  Lymphadenopathy:    He has no cervical adenopathy.  Neurological: He is alert and oriented to person, place, and time. Coordination normal.  Skin: Skin is warm and dry. No rash noted. He is not diaphoretic.  Psychiatric: He has a normal mood  and affect. His behavior is normal.  Nursing note and vitals reviewed.       Assessment & Plan:   Problem List Items Addressed This Visit      Cardiovascular and Mediastinum   Hypertension   Relevant Medications   losartan-hydrochlorothiazide (HYZAAR) 50-12.5 MG tablet   Other Relevant Orders   CMP14+EGFR (Completed)     Respiratory   Obstructive chronic bronchitis with exacerbation Gold B Copd   Relevant Medications   budesonide-formoterol (SYMBICORT) 160-4.5 MCG/ACT inhaler   albuterol (VENTOLIN HFA) 108 (90 Base) MCG/ACT inhaler   fluticasone (FLONASE) 50 MCG/ACT nasal spray   tiotropium (SPIRIVA HANDIHALER) 18 MCG inhalation capsule   Other Relevant Orders   Ambulatory referral to Pulmonology   CBC with Differential/Platelet (Completed)     Digestive   GERD (gastroesophageal reflux disease)   Relevant Medications   omeprazole (PRILOSEC) 40 MG capsule     Endocrine   Hypothyroidism (Chronic)   Relevant Medications   levothyroxine (SYNTHROID, LEVOTHROID) 125 MCG tablet   Other Relevant Orders   TSH (Completed)     Other   Hyperlipidemia LDL goal <130   Relevant Medications    losartan-hydrochlorothiazide (HYZAAR) 50-12.5 MG tablet   Other Relevant Orders   Lipid panel (Completed)   Oxygen dependent - Primary    Other Visit Diagnoses    Encounter for immunization       Relevant Orders   Flu vaccine HIGH DOSE PF (Completed)      Continue medications and refer to pulmonology.  Oxygen saturation in the office was normal.  On room air.  Continue losartan hydrochlorothiazide and levothyroxine and omeprazole Follow up plan: Return in about 6 months (around 08/01/2018), or if symptoms worsen or fail to improve, for COPD and thyroid and cholesterol recheck.  Counseling provided for all of the vaccine components Orders Placed This Encounter  Procedures  . CBC with Differential/Platelet  . CMP14+EGFR  . Lipid panel  . TSH  . Ambulatory referral to Marcus, MD [facility] 01/30/2018, 1:46 PM

## 2018-01-31 LAB — CBC WITH DIFFERENTIAL/PLATELET
Basophils Absolute: 0.1 10*3/uL (ref 0.0–0.2)
Basos: 1 %
EOS (ABSOLUTE): 0.1 10*3/uL (ref 0.0–0.4)
Eos: 1 %
Hematocrit: 45.8 % (ref 37.5–51.0)
Hemoglobin: 16.6 g/dL (ref 13.0–17.7)
Immature Grans (Abs): 0.1 10*3/uL (ref 0.0–0.1)
Immature Granulocytes: 1 %
Lymphocytes Absolute: 3.2 10*3/uL — ABNORMAL HIGH (ref 0.7–3.1)
Lymphs: 31 %
MCH: 33.2 pg — ABNORMAL HIGH (ref 26.6–33.0)
MCHC: 36.2 g/dL — ABNORMAL HIGH (ref 31.5–35.7)
MCV: 92 fL (ref 79–97)
Monocytes Absolute: 0.7 10*3/uL (ref 0.1–0.9)
Monocytes: 7 %
Neutrophils Absolute: 6.2 10*3/uL (ref 1.4–7.0)
Neutrophils: 59 %
Platelets: 203 10*3/uL (ref 150–450)
RBC: 5 x10E6/uL (ref 4.14–5.80)
RDW: 13 % (ref 12.3–15.4)
WBC: 10.3 10*3/uL (ref 3.4–10.8)

## 2018-01-31 LAB — CMP14+EGFR
ALT: 28 IU/L (ref 0–44)
AST: 25 IU/L (ref 0–40)
Albumin/Globulin Ratio: 1.9 (ref 1.2–2.2)
Albumin: 4.4 g/dL (ref 3.5–4.8)
Alkaline Phosphatase: 67 IU/L (ref 39–117)
BUN/Creatinine Ratio: 10 (ref 10–24)
BUN: 16 mg/dL (ref 8–27)
Bilirubin Total: 0.5 mg/dL (ref 0.0–1.2)
CO2: 21 mmol/L (ref 20–29)
Calcium: 9.1 mg/dL (ref 8.6–10.2)
Chloride: 103 mmol/L (ref 96–106)
Creatinine, Ser: 1.64 mg/dL — ABNORMAL HIGH (ref 0.76–1.27)
GFR calc Af Amer: 48 mL/min/{1.73_m2} — ABNORMAL LOW (ref >59)
GFR calc non Af Amer: 41 mL/min/{1.73_m2} — ABNORMAL LOW (ref >59)
Globulin, Total: 2.3 g/dL (ref 1.5–4.5)
Glucose: 104 mg/dL — ABNORMAL HIGH (ref 65–99)
Potassium: 3.7 mmol/L (ref 3.5–5.2)
Sodium: 142 mmol/L (ref 134–144)
Total Protein: 6.7 g/dL (ref 6.0–8.5)

## 2018-01-31 LAB — LIPID PANEL
Chol/HDL Ratio: 2 ratio (ref 0.0–5.0)
Cholesterol, Total: 98 mg/dL — ABNORMAL LOW (ref 100–199)
HDL: 49 mg/dL (ref >39)
LDL Calculated: 18 mg/dL (ref 0–99)
Triglycerides: 153 mg/dL — ABNORMAL HIGH (ref 0–149)
VLDL Cholesterol Cal: 31 mg/dL (ref 5–40)

## 2018-01-31 LAB — TSH: TSH: 3.09 u[IU]/mL (ref 0.450–4.500)

## 2018-02-09 DIAGNOSIS — J449 Chronic obstructive pulmonary disease, unspecified: Secondary | ICD-10-CM | POA: Diagnosis not present

## 2018-02-13 ENCOUNTER — Encounter: Payer: Self-pay | Admitting: Pulmonary Disease

## 2018-02-13 ENCOUNTER — Ambulatory Visit: Payer: Medicare HMO | Admitting: Pulmonary Disease

## 2018-02-13 ENCOUNTER — Ambulatory Visit (HOSPITAL_BASED_OUTPATIENT_CLINIC_OR_DEPARTMENT_OTHER)
Admission: RE | Admit: 2018-02-13 | Discharge: 2018-02-13 | Disposition: A | Discharge status: Home / Self Care | Payer: Medicare HMO | Source: Ambulatory Visit | Attending: Pulmonary Disease | Admitting: Pulmonary Disease

## 2018-02-13 VITALS — BP 145/83 | HR 76 | Ht 67.5 in | Wt 203.0 lb

## 2018-02-13 DIAGNOSIS — R0602 Shortness of breath: Secondary | ICD-10-CM | POA: Insufficient documentation

## 2018-02-13 DIAGNOSIS — R0600 Dyspnea, unspecified: Secondary | ICD-10-CM | POA: Diagnosis present

## 2018-02-13 DIAGNOSIS — J441 Chronic obstructive pulmonary disease with (acute) exacerbation: Secondary | ICD-10-CM

## 2018-02-13 DIAGNOSIS — R918 Other nonspecific abnormal finding of lung field: Secondary | ICD-10-CM | POA: Insufficient documentation

## 2018-02-13 DIAGNOSIS — J8489 Other specified interstitial pulmonary diseases: Secondary | ICD-10-CM | POA: Diagnosis not present

## 2018-02-13 MED ORDER — REVEFENACIN 175 MCG/3ML IN SOLN: 3.0000 mL | Freq: Every day | RESPIRATORY_TRACT | 12 refills | Status: DC

## 2018-02-13 NOTE — Patient Instructions (Signed)
Worsening shortness of breath: We will check a chest x-ray We will check spirometry testing We will check your oxygen while walking  COPD: Continue Symbicort Start taking Yupelri 1 time a day Use albuterol as needed for chest tightness wheezing or shortness of breath  History of organizing pneumonia: You should never take amiodarone (this is a heart medicine) We will check a chest x-ray today  Follow-up in 2 to 3 weeks to see how you are doing with the new medicine

## 2018-02-13 NOTE — Progress Notes (Signed)
Synopsis: Referred in October 2019 for dyspnea; formerly followed by Dr. Joya Gaskins for COPD and organizing pneumonia.  He was treated for COP in 2012 with prednisone for several months.    Subjective:   PATIENT ID: Antonio Bowen GENDER: male DOB: [birth year], MRN: [id number]   HPI  Chief Complaint  Patient presents with  . Pulm Consult    Referred by PCP for COPD. States his breathing has gotten worse since 2016. Uses O2 during the night. Wants to qualify for a POC so that he can travel. Is currently using [facility] as his DME.    [name] was previously followed by Dr. Joya Gaskins for COPD and organizing pneumonia . He was treated with prednisone for a number of months.    Since he was seen her in 2017 he has been on prednisone off and on, perhaps a total of 12 weeks in the last 3 years.   He was taking Symbicort for years but recently he was changed to Spiriva.  He tried taking Trelegy but it made him break out in his mouth.  He says that he cannot afford Symbicort and Spiriva.  He would really like to be on Symbicort and spiriva but he cannot afford this.    He says that this year he had increasing shortness of breath and wheezing so he went to his physician for this.  He was changed from Symbicort to Spiriva.  He says that this has not been very helpful so he wanted to come back and see [place].  He says that his cost of medicines is quite significant and he is not really able to afford co-pays for both Symbicort and Spiriva.  He tells me that he has been off and on prednisone but it is typically something that he has used for back pain and hip pain.  He notices that it causes his breathing to improve but he does not specifically take the prednisone because of a breathing problem.  So, he does not indicate that he has had recurrent episodes of bronchitis or pneumonia over the last several years which have required specific treatment.  Past Medical History:  Diagnosis Date  . Allergic rhinitis   .  Arthritis   . BOOP (bronchiolitis obliterans with organizing pneumonia) ([place])   . Bronchitis   . Colon polyps   . COPD (chronic obstructive pulmonary disease) ([place])   . Dyspnea    with exertion   . Dysrhythmia    skipped beats- followed by Dr Angelena Form   . GERD (gastroesophageal reflux disease)   . Hypertension   . Hypothyroidism   . Kidney stone   . Low back pain   . Oxygen dependent    uses 3L/[place] at nite occasional during day if needed   . Stroke [facility])    hx of 2 strokes which patient did not know he had had   . Thyroid disease   . Tobacco dependence      Family History  Problem Relation Age of Onset  . Arthritis Father   . Stroke Father 45  . Cancer Maternal Grandfather   . Prostate cancer Maternal Grandfather   . Stomach cancer Maternal Grandmother   . Cancer Paternal Grandmother   . Colon cancer Unknown        mat 1st cousin  . Cancer Mother   . Healthy Daughter   . Healthy Son      Social History   Socioeconomic History  . Marital status: Married    Spouse  name: Not on file  . Number of children: 2  . Years of education: 72  . Highest education level: 11th grade  Occupational History  . 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 Financial resource strain: Not hard at all  . Food insecurity:    Worry: Never true    Inability: Never true  . Transportation needs:    Medical: No    Non-medical: No  Tobacco Use  . Smoking status: Former Smoker    Packs/day: 1.00    Years: 50.00    Pack years: 50.00    Types: Cigarettes    Last attempt to quit: 07/30/2010    Years since quitting: 7.5  . Smokeless tobacco: Current User    Types: Snuff  Substance and Sexual Activity  . Alcohol use: Yes    Comment: rare  . Drug use: No  . Sexual activity: Not on file  Lifestyle  . Physical activity:    Days per week: 7 days    Minutes per session: 30 min  . Stress: Not at all  Relationships  .  Social connections:    Talks on phone: More than three times a week    Gets together: More than three times a week    Attends religious service: Never    Active member of club or organization: No    Attends meetings of clubs or organizations: Never    Relationship status: Married  . Intimate partner violence:    Fear of current or ex partner: No    Emotionally abused: No    Physically abused: No    Forced sexual activity: No  Other Topics Concern  . Not on file  Social History Narrative  . Not on file     Allergies  Allergen Reactions  . Nuts Nausea And Vomiting    sweating  . Vicodin [Hydrocodone-Acetaminophen]     tachycardia  . Codeine   . Aspirin Other (See Comments)    Upset stomach, bleeding  . Other Nausea And Vomiting    Per patient, cannot take any narcotic. Nausea and vomiting  . Penicillins Hives and Rash    Has patient had a PCN reaction causing immediate rash, facial/tongue/throat swelling, SOB or lightheadedness with hypotension:No Has patient had a PCN reaction causing severe rash involving mucus membranes or skin necrosis:No Has patient had a PCN reaction that required hospitalization:No Has patient had a PCN reaction occurring within the last 10 years:Yes If all of the above answers are "NO", then may proceed with Cephalosporin use.      Outpatient Medications Prior to Visit  Medication Sig Dispense Refill  . acetaminophen (TYLENOL) 500 MG tablet Take 1,000 mg by mouth 2 (two) times daily as needed (for pain.).    Marland Kitchen albuterol (VENTOLIN HFA) 108 (90 Base) MCG/ACT inhaler INHALE TWO PUFFS INTO LUNGS EVERY 6 HOURS AS NEEDED FOR WHEEZING 2 Inhaler 4  . baclofen (LIORESAL) 10 MG tablet Take 1 tablet (10 mg total) by mouth 3 (three) times daily. 30 each 0  . budesonide-formoterol (SYMBICORT) 160-4.5 MCG/ACT inhaler Inhale 2 puffs into the lungs 2 (two) times daily. 1 Inhaler 3  . cetirizine (ZYRTEC) 10 MG tablet Take 10 mg by mouth at bedtime.     . fluticasone  (FLONASE) 50 MCG/ACT nasal spray Place 1 spray into both nostrils 2 (two) times daily as needed for allergies or rhinitis. 48 g 3  .  Homeopathic Products (LEG CRAMP RELIEF PO) Take 2 tablets by mouth at bedtime. Hyland's    . levothyroxine (SYNTHROID, LEVOTHROID) 125 MCG tablet Take 1 tablet (125 mcg total) by mouth daily before breakfast. 90 tablet 3  . losartan-hydrochlorothiazide (HYZAAR) 50-12.5 MG tablet Take 1 tablet by mouth daily. 90 tablet 3  . magnesium oxide (MAG-OX) 400 MG tablet Take 400 mg by mouth at bedtime.     . meloxicam (MOBIC) 15 MG tablet Take 1 tablet (15 mg total) by mouth daily. 90 tablet 1  . omeprazole (PRILOSEC) 40 MG capsule TAKE 1 CAPSULE TWICE DAILY  ( APPOINTMENT IS NEEDED  ) 180 capsule 6  . PRESCRIPTION MEDICATION Oxygen 3.0 liters at night, prn use in day    . Respiratory Therapy Supplies (FLUTTER) DEVI Use three times daily 1 each 0  . SUPER B COMPLEX/C PO Take 1 capsule by mouth at bedtime.    Marland Kitchen tiotropium (SPIRIVA HANDIHALER) 18 MCG inhalation capsule Place 1 capsule (18 mcg total) into inhaler and inhale daily. 90 capsule 3  . traMADol (ULTRAM) 50 MG tablet Take 1 tablet (50 mg total) by mouth 2 (two) times daily. 60 tablet 2   No facility-administered medications prior to visit.     Review of Systems  Constitutional: Negative for chills, fever, malaise/fatigue and weight loss.  HENT: Positive for congestion. Negative for nosebleeds, sinus pain and sore throat.   Eyes: Negative for photophobia, pain and discharge.  Respiratory: Positive for cough and shortness of breath. Negative for hemoptysis, sputum production and wheezing.   Cardiovascular: Negative for chest pain, palpitations, orthopnea and leg swelling.  Gastrointestinal: Negative for abdominal pain, constipation, diarrhea, nausea and vomiting.  Genitourinary: Negative for dysuria, frequency, hematuria and urgency.  Musculoskeletal: Positive for joint pain. Negative for back pain, myalgias and  neck pain.  Skin: Negative for itching and rash.  Neurological: Negative for tingling, tremors, sensory change, speech change, focal weakness, seizures, weakness and headaches.  Psychiatric/Behavioral: Negative for memory loss, substance abuse and suicidal ideas. The patient is not nervous/anxious.       Objective:  Physical Exam   Vitals:   02/13/18 1445  BP: (!) 145/83  Pulse: 76  SpO2: 97%  Weight: 203 lb (92.1 kg)  Height: 5' 7.5" (1.715 m)   Walked 500 feet, O2 saturation was above 98%  Gen: well appearing, no acute distress HENT: NCAT, OP clear, neck supple without masses Eyes: PERRL, EOMi Lymph: no cervical lymphadenopathy PULM: Poor air movement, no wheezing CV: RRR, no mgr, no JVD GI: BS+, soft, nontender, no hsm Derm: no rash or skin breakdown MSK: normal bulk and tone Neuro: A&Ox4, CN II-XII intact, strength 5/5 in all 4 extremities Psyche: normal mood and affect   CBC    Component Value Date/Time   WBC 10.3 01/30/2018 1346   WBC 13.1 (H) 02/18/2015 1050   RBC 5.00 01/30/2018 1346   RBC 5.28 02/18/2015 1050   HGB 16.6 01/30/2018 1346   HCT 45.8 01/30/2018 1346   PLT 203 01/30/2018 1346   MCV 92 01/30/2018 1346   MCH 33.2 (H) 01/30/2018 1346   MCH 32.0 02/18/2015 1050   MCHC 36.2 (H) 01/30/2018 1346   MCHC 34.7 02/18/2015 1050   RDW 13.0 01/30/2018 1346   LYMPHSABS 3.2 (H) 01/30/2018 1346   MONOABS 0.5 02/18/2015 1050   EOSABS 0.1 01/30/2018 1346   BASOSABS 0.1 01/30/2018 1346     Chest imaging: 2012 CT chest images independently reviewed showing groundglass and an upper lobe  and dependent distribution, there is also significant centrilobular emphysema.  PFT: April 2016 spirometry ratio 71%, FEV1 3.2 L 94%.  Labs:  Path: 2012 transbronchial lung biopsy left upper lobe showed mild chronic inflammation, microscopic fibrosis raising the possibility of organizing type fibrosis.  Echo:  Heart Catheterization:       Assessment & Plan:    Shortness of breath - Plan: DG Chest 2 View, Spirometry with Graph  Discussion: This is a pleasant 72 year old male with centrilobular emphysema who was diagnosed with organizing pneumonia in 2012 off of transbronchial lung biopsies.  He comes to see me today for evaluation of worsening shortness of breath which I think is likely due to worsening COPD/centrilobular emphysema.  Given his degree of symptoms despite taking Symbicort I think he probably needs to take something in addition to Symbicort.  Specifically I think the addition of a long-acting muscarinic antagonist would be helpful.  Cost is a significant issue.  The differential diagnosis includes recurrent pneumonia but I doubt this given his physical exam.  Plan: Worsening shortness of breath: We will check a chest x-ray We will check spirometry testing We will check your oxygen while walking Next visit : 6 min walk  COPD: Continue Symbicort Start taking Yupelri 1 time a day Use albuterol as needed for chest tightness wheezing or shortness of breath  History of organizing pneumonia: You should never take amiodarone (this is a heart medicine) We will check a chest x-ray today  Follow-up in 2 to 3 weeks to see how you are doing with the new medicine [facility] for that visit so you can have a 6 min walk, all other visits can be in [employer])    Current Outpatient Medications:  .  acetaminophen (TYLENOL) 500 MG tablet, Take 1,000 mg by mouth 2 (two) times daily as needed (for pain.)., Disp: , Rfl:  .  albuterol (VENTOLIN HFA) 108 (90 Base) MCG/ACT inhaler, INHALE TWO PUFFS INTO LUNGS EVERY 6 HOURS AS NEEDED FOR WHEEZING, Disp: 2 Inhaler, Rfl: 4 .  baclofen (LIORESAL) 10 MG tablet, Take 1 tablet (10 mg total) by mouth 3 (three) times daily., Disp: 30 each, Rfl: 0 .  budesonide-formoterol (SYMBICORT) 160-4.5 MCG/ACT inhaler, Inhale 2 puffs into the lungs 2 (two) times daily., Disp: 1 Inhaler, Rfl: 3 .  cetirizine (ZYRTEC) 10  MG tablet, Take 10 mg by mouth at bedtime. , Disp: , Rfl:  .  fluticasone (FLONASE) 50 MCG/ACT nasal spray, Place 1 spray into both nostrils 2 (two) times daily as needed for allergies or rhinitis., Disp: 48 g, Rfl: 3 .  Homeopathic Products (LEG CRAMP RELIEF PO), Take 2 tablets by mouth at bedtime. Hyland's, Disp: , Rfl:  .  levothyroxine (SYNTHROID, LEVOTHROID) 125 MCG tablet, Take 1 tablet (125 mcg total) by mouth daily before breakfast., Disp: 90 tablet, Rfl: 3 .  losartan-hydrochlorothiazide (HYZAAR) 50-12.5 MG tablet, Take 1 tablet by mouth daily., Disp: 90 tablet, Rfl: 3 .  magnesium oxide (MAG-OX) 400 MG tablet, Take 400 mg by mouth at bedtime. , Disp: , Rfl:  .  meloxicam (MOBIC) 15 MG tablet, Take 1 tablet (15 mg total) by mouth daily., Disp: 90 tablet, Rfl: 1 .  omeprazole (PRILOSEC) 40 MG capsule, TAKE 1 CAPSULE TWICE DAILY  ( APPOINTMENT IS NEEDED  ), Disp: 180 capsule, Rfl: 6 .  PRESCRIPTION MEDICATION, Oxygen 3.0 liters at night, prn use in day, Disp: , Rfl:  .  Respiratory Therapy Supplies (FLUTTER) DEVI, Use three times daily, Disp: 1 each,  Rfl: 0 .  SUPER B COMPLEX/C PO, Take 1 capsule by mouth at bedtime., Disp: , Rfl:  .  tiotropium (SPIRIVA HANDIHALER) 18 MCG inhalation capsule, Place 1 capsule (18 mcg total) into inhaler and inhale daily., Disp: 90 capsule, Rfl: 3 .  traMADol (ULTRAM) 50 MG tablet, Take 1 tablet (50 mg total) by mouth 2 (two) times daily., Disp: 60 tablet, Rfl: 2

## 2018-02-14 ENCOUNTER — Telehealth: Payer: Self-pay | Admitting: Pulmonary Disease

## 2018-02-14 NOTE — Telephone Encounter (Signed)
Called and spoke with patient regarding results.  Informed the patient of results and recommendations today. Pt verbalized understanding and denied any questions or concerns at this time.  Nothing further needed.  

## 2018-02-14 NOTE — Telephone Encounter (Signed)
Patient was seen yesterday by BQ in [facility]. Patient was prescribed Yupelri and wishes to have this to be sent to [facility]. I left a message for the patient to call back to see if he already has a nebulizer and to schedule him for his 2 week f/u.   Will leave this message open until he calls back.

## 2018-03-11 ENCOUNTER — Telehealth: Payer: Self-pay | Admitting: Pulmonary Disease

## 2018-03-11 DIAGNOSIS — R0602 Shortness of breath: Secondary | ICD-10-CM

## 2018-03-11 DIAGNOSIS — J441 Chronic obstructive pulmonary disease with (acute) exacerbation: Secondary | ICD-10-CM

## 2018-03-11 DIAGNOSIS — J8489 Other specified interstitial pulmonary diseases: Secondary | ICD-10-CM

## 2018-03-11 NOTE — Telephone Encounter (Signed)
See phone note from 03/11/18 as there are duplicate information in regards to this message on there.

## 2018-03-11 NOTE — Telephone Encounter (Signed)
Per [name], the order for the [name] was already faxed to [name] for pt. Due to pt never contacting us back in regards to the neb machine an order had not been sent in for that.  I have placed an order for pt to be provided a neb machine through [place]. Nothing further needed.

## 2018-03-11 NOTE — Telephone Encounter (Signed)
Called and spoke with pt who stated the neb sol was going to cost him $200 at his pharmacy but if it was sent through [facility], it would cost him around $5.  Asked pt if he had a neb machine or if he was in need of that as well and pt stated he does not have a neb machine and would need that too.  Cherina, looking back at a previous message from 10/18, it stated that a Rx was printed for pt's Yupelri. Please advise if you still have this Rx from when pt saw BQ in [place] or if we need to reprint it and have it signed again by BQ.

## 2018-03-12 DIAGNOSIS — J449 Chronic obstructive pulmonary disease, unspecified: Secondary | ICD-10-CM | POA: Diagnosis not present

## 2018-03-13 ENCOUNTER — Telehealth: Payer: Self-pay | Admitting: Pulmonary Disease

## 2018-03-13 NOTE — Telephone Encounter (Signed)
This is using medicare part B into account? Are we following the checklist on the yupelri prescribing sheets from the manufacturer?

## 2018-03-13 NOTE — Telephone Encounter (Signed)
Spoke with [employer], they state the Thereasa Parkin is 216 dollars and patient is requesting another medication to be used instead of the [place]. BQ please advise.    Patient Instructions by Juanito Doom, MD at 02/13/2018 2:45 PM  Author: Juanito Doom, MD Author Type: Physician Filed: 02/13/2018 3:37 PM  Note Status: Signed Cosign: Cosign Not Required Encounter Date: 02/13/2018  Editor: Juanito Doom, MD (Physician)    Worsening shortness of breath: We will check a chest x-ray We will check spirometry testing We will check your oxygen while walking  COPD: Continue Symbicort Start taking Yupelri 1 time a day Use albuterol as needed for chest tightness wheezing or shortness of breath  History of organizing pneumonia: You should never take amiodarone (this is a heart medicine) We will check a chest x-ray today  Follow-up in 2 to 3 weeks to see how you are doing with the new medicine

## 2018-03-14 NOTE — Telephone Encounter (Signed)
Attempted to call patient today regarding BQ recommendations. I did not receive an answer at time of call. I have left a voicemail message for pt to return call. X1

## 2018-03-14 NOTE — Telephone Encounter (Signed)
Dr. Lake Bells, the checklist was followed and billed under Medicare part B through [place].   Revefenacin (YUPELRI) 175 MCG/3ML SOLN [[id number]]   Order Details  Dose: 3 mL Route: Inhalation Frequency: Daily  Note to Pharmacy:  Use DX: J44.1      Dispense Quantity: 90 mL Refills: 12 Fills remaining: --        Sig: Inhale 3 mLs into the lungs daily.       Written Date: 02/13/18 Expiration Date: 02/13/19    Start Date: 02/13/18 End Date: --         Ordering Provider: Juanito Doom, MD DEA #:  [id number] NPI:  [id number]   Authorizing Provider: Juanito Doom, MD DEA #:  [id number] NPI:  [id number]   Ordering User:  Valerie Salts, [place]:  [place], [address] DEA #:  --    Pharmacy Comments:  --

## 2018-03-14 NOTE — Telephone Encounter (Signed)
OK, spiriva 2.5 2 puffs daily then

## 2018-03-17 NOTE — Telephone Encounter (Signed)
Called and spoke with patient, he stated that he is currently on spiriva and he would be calling his insurance to see if there was another medication that was cheaper than the Yuperli so that he can take something similar. Patient stated he would call back once he heard back. Will leave encounter open.

## 2018-03-17 NOTE — Telephone Encounter (Signed)
Attempted to call pt but unable to reach him. Left message for pt to return call. 

## 2018-03-17 NOTE — Telephone Encounter (Signed)
Patient returned, CB is [phone]

## 2018-03-19 NOTE — Telephone Encounter (Signed)
Called patient unable to reach left message to give us a call back.

## 2018-03-19 NOTE — Telephone Encounter (Signed)
Called and spoke with rep at [facility], informed her the patient would be calling his insurance to find out what is on his formulary. Rep reports that ipratropium would only cost the patient $3.00.   BQ please advise if patient can use ipratropium nebs? Thanks.

## 2018-03-19 NOTE — Telephone Encounter (Signed)
Called and spoke to patient, patient states he spoke to [place] who spoke with his insurance who suggested ipratropium. I explained to the patient that even though it is affordable BQ does not think it is adequate. Patient again stated that he would like the $3.00 ipratropium, I again explained that he needed to call his insurance to find out what is covered. The patient became more confused. I informed the patient we would call back. Call made to [facility]. Spoke with Donella Stade, [place] needs a PA. Triage to initiate PA in AM due to time. Currently 5:54pm.

## 2018-03-19 NOTE — Telephone Encounter (Signed)
Larkin Ina from [employer], CB is [phone] regarding substitution for [facility]

## 2018-03-19 NOTE — Telephone Encounter (Signed)
No, this is not an adequate substitution Surely there is something else that is covered Have the patient provide his formulary

## 2018-03-19 NOTE — Telephone Encounter (Signed)
Patient returned call, CB is [phone]

## 2018-03-20 NOTE — Telephone Encounter (Signed)
Looked through pt's medication list to see which meds he has tried/failed in the past. Pt has not been on any other neb meds other than the Yupelri which was prescribed by BQ. Pt is currently on the spiriva handihaler and symbicort and does have the Ventolin inhaler as his rescue inhaler.  I saw that pt has also been on Qvar, Trelegy, and also Breo in the past.  I called and spoke with pt asking him to call the insurance company. Stated to pt we would need the insurance company to provide [place] with a formulary list so we can see what all medications are covered by his insurance company, especially the nebulizer meds so BQ can be able to see if there is a med that is covered by his insurance that is adequate enough compared to the [place].  Pt expressed understanding. I gave pt our fax number that way he could give it to his insurance company so they could go ahead and fax [place] the list so we could give it to BQ to look at. Will leave this encounter open.

## 2018-03-25 NOTE — Telephone Encounter (Signed)
Iris from [facility] is calling back about another medication besides [name]     [phone]

## 2018-03-25 NOTE — Telephone Encounter (Signed)
Ipratropium nebs is not adequate substitution to [employer] and spoke with Myra who wanted to know if we were wanting to switch pt to ipratropium but I stated to her BQ said this med is not an adequate substitution. Stated to her pt was notified 03/20/18 and was asked to call insurance company to get a formulary list of all covered meds so we could have BQ look at. Myra expressed understanding. Nothing further needed.

## 2018-03-31 ENCOUNTER — Telehealth: Payer: Self-pay | Admitting: Pulmonary Disease

## 2018-03-31 NOTE — Telephone Encounter (Signed)
I called [facility] but they were closed. Will call in the morning.

## 2018-04-01 NOTE — Telephone Encounter (Signed)
Attempted to contact [facility]. I was placed on a 5+ min hold. Will try back.

## 2018-04-02 NOTE — Telephone Encounter (Signed)
Called [facility] and spoke with Crystal. Crystal stated that they received an order for neb machine but has not received an order for neb sol. I stated to [place] that we had sent an order for Yupelri but due to cost, we had placed a call in to pt asking him to call insurance company to see if they could provide him with a formulary list so BQ could be able to look at it to see what neb sol are adequate to [place].  Crystal stated to me until we were able to send Rx to them in regards to neb sol, they were currently going to cancel the order for the neb machine until we can get med taken care of for pt and then once we got neb sol taken care of and had the Rx sent to [place], they would reactivate the order for the neb machine to be sent to pt.  Called pt but unable to reach him. Left message for pt to return call.

## 2018-04-03 DIAGNOSIS — R252 Cramp and spasm: Secondary | ICD-10-CM | POA: Diagnosis not present

## 2018-04-03 DIAGNOSIS — S29011A Strain of muscle and tendon of front wall of thorax, initial encounter: Secondary | ICD-10-CM | POA: Diagnosis not present

## 2018-04-03 DIAGNOSIS — R0789 Other chest pain: Secondary | ICD-10-CM | POA: Diagnosis not present

## 2018-04-03 NOTE — Telephone Encounter (Signed)
Attempted to call pt but unable to reach him. Left message for pt to return call.  In the message, I stated to pt that we were needing him to schedule f/u appt at our office so we can see if we can get his neb meds taken care of and I also asked him in the message if he had been able to call insurance company to receive formulary list of all meds, especially neb solutions that are covered by insurance.  When pt calls back, please get pt scheduled for a f/u visit. Thanks!

## 2018-04-07 NOTE — Telephone Encounter (Signed)
Called and spoke with patient. He is very upset with his last encounter with Dr. Lake Bells. He states "Dr. Lake Bells said I was more concerned with money and not my health and I do not want to have anything to do with this doctor. 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 Lake Bells will not approve it".  I offered for him to see another provider, patient was asking who else is out at the [facility] office. I told him Dr. Halford Chessman, but we would have to get approval of the switch and call him back to make the appointment.   Will route to Dr. Lake Bells and Dr. Halford Chessman to verify if switch is appropriate.

## 2018-04-07 NOTE — Telephone Encounter (Signed)
I am okay with him switching to me.

## 2018-04-07 NOTE — Telephone Encounter (Signed)
BQ please advise if you are ok with this switch.  Thanks.

## 2018-04-08 NOTE — Telephone Encounter (Signed)
OK with switch ?

## 2018-04-08 NOTE — Telephone Encounter (Signed)
Spoke with pt, advised him that Dr. Halford Chessman agreed to take him on as a patient. He refused to make an appt at this time but when he calls to make an appointment he will make it with VS. Nothing further is needed.

## 2018-04-11 DIAGNOSIS — J449 Chronic obstructive pulmonary disease, unspecified: Secondary | ICD-10-CM | POA: Diagnosis not present

## 2018-05-12 DIAGNOSIS — J449 Chronic obstructive pulmonary disease, unspecified: Secondary | ICD-10-CM | POA: Diagnosis not present

## 2018-06-12 DIAGNOSIS — J449 Chronic obstructive pulmonary disease, unspecified: Secondary | ICD-10-CM | POA: Diagnosis not present

## 2018-07-10 DIAGNOSIS — H6121 Impacted cerumen, right ear: Secondary | ICD-10-CM | POA: Diagnosis not present

## 2018-07-11 DIAGNOSIS — J449 Chronic obstructive pulmonary disease, unspecified: Secondary | ICD-10-CM | POA: Diagnosis not present

## 2018-08-11 DIAGNOSIS — J449 Chronic obstructive pulmonary disease, unspecified: Secondary | ICD-10-CM | POA: Diagnosis not present

## 2018-09-10 ENCOUNTER — Other Ambulatory Visit: Payer: Self-pay | Admitting: *Deleted

## 2018-09-10 ENCOUNTER — Ambulatory Visit: Payer: Medicare HMO | Admitting: Family Medicine

## 2018-09-11 ENCOUNTER — Other Ambulatory Visit: Payer: Self-pay

## 2018-09-12 ENCOUNTER — Other Ambulatory Visit: Payer: Self-pay

## 2018-09-12 ENCOUNTER — Ambulatory Visit (INDEPENDENT_AMBULATORY_CARE_PROVIDER_SITE_OTHER): Payer: Medicare HMO | Admitting: Family Medicine

## 2018-09-12 ENCOUNTER — Encounter: Payer: Self-pay | Admitting: Family Medicine

## 2018-09-12 VITALS — BP 137/93 | HR 84 | Temp 97.5°F | Ht 67.5 in | Wt 208.2 lb

## 2018-09-12 DIAGNOSIS — M1712 Unilateral primary osteoarthritis, left knee: Secondary | ICD-10-CM | POA: Diagnosis not present

## 2018-09-12 DIAGNOSIS — E785 Hyperlipidemia, unspecified: Secondary | ICD-10-CM | POA: Diagnosis not present

## 2018-09-12 DIAGNOSIS — K219 Gastro-esophageal reflux disease without esophagitis: Secondary | ICD-10-CM

## 2018-09-12 DIAGNOSIS — E039 Hypothyroidism, unspecified: Secondary | ICD-10-CM

## 2018-09-12 DIAGNOSIS — I10 Essential (primary) hypertension: Secondary | ICD-10-CM | POA: Diagnosis not present

## 2018-09-12 DIAGNOSIS — N183 Chronic kidney disease, stage 3 unspecified: Secondary | ICD-10-CM | POA: Insufficient documentation

## 2018-09-12 DIAGNOSIS — J441 Chronic obstructive pulmonary disease with (acute) exacerbation: Secondary | ICD-10-CM

## 2018-09-12 MED ORDER — TRAMADOL HCL 50 MG PO TABS: 50.0000 mg | ORAL_TABLET | Freq: Three times a day (TID) | ORAL | 1 refills | Status: DC | PRN

## 2018-09-12 MED ORDER — LOSARTAN POTASSIUM-HCTZ 50-12.5 MG PO TABS: 1.0000 | ORAL_TABLET | Freq: Every day | ORAL | 3 refills | Status: DC

## 2018-09-12 MED ORDER — BUDESONIDE-FORMOTEROL FUMARATE 160-4.5 MCG/ACT IN AERO: 2.0000 | INHALATION_SPRAY | Freq: Two times a day (BID) | RESPIRATORY_TRACT | 3 refills | Status: DC

## 2018-09-12 MED ORDER — ALBUTEROL SULFATE HFA 108 (90 BASE) MCG/ACT IN AERS: INHALATION_SPRAY | RESPIRATORY_TRACT | 4 refills | Status: DC

## 2018-09-12 MED ORDER — FLUTICASONE PROPIONATE 50 MCG/ACT NA SUSP: 1.0000 | Freq: Two times a day (BID) | NASAL | 3 refills | Status: DC | PRN

## 2018-09-12 MED ORDER — LEVOTHYROXINE SODIUM 125 MCG PO TABS: 125.0000 ug | ORAL_TABLET | Freq: Every day | ORAL | 3 refills | Status: DC

## 2018-09-12 MED ORDER — TIOTROPIUM BROMIDE MONOHYDRATE 18 MCG IN CAPS: 18.0000 ug | ORAL_CAPSULE | Freq: Every day | RESPIRATORY_TRACT | 12 refills | Status: DC

## 2018-09-12 MED ORDER — OMEPRAZOLE 40 MG PO CPDR: DELAYED_RELEASE_CAPSULE | ORAL | 3 refills | Status: DC

## 2018-09-12 MED ORDER — MELOXICAM 15 MG PO TABS: 15.0000 mg | ORAL_TABLET | Freq: Every day | ORAL | 1 refills | Status: DC

## 2018-09-12 NOTE — Progress Notes (Signed)
BP (!) 137/93   Pulse 84   Temp (!) 97.5 F (36.4 C) (Oral)   Ht 5' 7.5" (1.715 m)   Wt 208 lb 3.2 oz (94.4 kg)   BMI 32.13 kg/m    Subjective:   Patient ID: Antonio Bowen, male    DOB: Feb 06, 1946, 73 y.o.   MRN: [id number]  HPI: Antonio Bowen is a 73 y.o. male presenting on 09/12/2018 for Medical Management of Chronic Issues (6 month follow up)   HPI Hypothyroidism recheck Patient is coming in for thyroid recheck today as well. They deny any issues with hair changes or heat or cold problems or diarrhea or constipation. They deny any chest pain or palpitations. They are currently on levothyroxine 136mcrograms   Hypertension Patient is currently on losartan-hctz, and their blood pressure today is 137/93, he says at home that it is running always lower than this. Patient denies any lightheadedness or dizziness. Patient denies headaches, blurred vision, chest pains, shortness of breath, or weakness. Denies any side effects from medication and is content with current medication.   Hyperlipidemia Patient is coming in for recheck of his hyperlipidemia. The patient is currently taking no medication and are monitoring and doing diet control. They deny any issues with myalgias or history of liver damage from it. They deny any focal numbness or weakness or chest pain.   COPD Patient is coming in for COPD recheck today.  He is currently on symbicort.  He has a mild chronic cough but denies any major coughing spells or wheezing spells.  He has 2nighttime symptoms per week and 3daytime symptoms per week currently.    GERD Patient is currently on omeprazole.  She denies any major symptoms or abdominal pain or belching or burping. She denies any blood in her stool or lightheadedness or dizziness.   Pain assessment: Cause of pain- left knee pain and arthritis Pain location- left knee Pain on scale of 1-10- 3 Frequency- daily What increases pain-being mobile What makes pain Better-rest  and medication Effects on ADL - limits ability to stay active and do day to day work Any change in general medical condition-none  Current opioids rx- tramadol '50mg'$  q8h prn # meds rx- 90 Effectiveness of current meds-working well Adverse reactions form pain meds-none Morphine equivalent- 15  Pill count performed-No Last drug screen - none ( high risk q348mmoderate risk q6m23mow risk yearly ) Urine drug screen today- No [place]  If yes were their any concerning findings? - none  No flowsheet data found.   Pain contract signed on: none  Relevant past medical, surgical, family and social history reviewed and updated as indicated. Interim medical history since our last visit reviewed. Allergies and medications reviewed and updated.  Review of Systems  Constitutional: Negative for chills and fever.  Eyes: Negative for visual disturbance.  Respiratory: Negative for shortness of breath and wheezing.   Cardiovascular: Negative for chest pain and leg swelling.  Musculoskeletal: Positive for arthralgias. Negative for back pain and gait problem.  Skin: Negative for rash.  All other systems reviewed and are negative.   Per HPI unless specifically indicated above   Allergies as of 09/12/2018      Reactions   Nuts Nausea And Vomiting   sweating   Vicodin [hydrocodone-acetaminophen]    tachycardia   Codeine    Aspirin Other (See Comments)   Upset stomach, bleeding   Other Nausea And Vomiting   Per patient, cannot take any  narcotic. Nausea and vomiting   Penicillins Hives, Rash   Has patient had a PCN reaction causing immediate rash, facial/tongue/throat swelling, SOB or lightheadedness with hypotension:No Has patient had a PCN reaction causing severe rash involving mucus membranes or skin necrosis:No Has patient had a PCN reaction that required hospitalization:No Has patient had a PCN reaction occurring within the last 10 years:Yes If all of the above answers  are "NO", then may proceed with Cephalosporin use.      Medication List       Accurate as of Sep 12, 2018  4:05 PM. If you have any questions, ask your nurse or doctor.        STOP taking these medications   baclofen 10 MG tablet Commonly known as:  LIORESAL Stopped by:  Worthy Rancher, MD   Revefenacin 175 MCG/3ML Soln Commonly known as:  Latina Craver by:  [name] Navayah Sok, MD   tiotropium 18 MCG inhalation capsule Commonly known as:  Spiriva HandiHaler Stopped by:  [name] Kaelani Kendrick, MD     TAKE these medications   acetaminophen 500 MG tablet Commonly known as:  TYLENOL Take 1,000 mg by mouth 2 (two) times daily as needed (for pain.).   albuterol 108 (90 Base) MCG/ACT inhaler Commonly known as:  Ventolin HFA INHALE TWO PUFFS INTO LUNGS EVERY 6 HOURS AS NEEDED FOR WHEEZING   budesonide-formoterol 160-4.5 MCG/ACT inhaler Commonly known as:  SYMBICORT Inhale 2 puffs into the lungs 2 (two) times daily.   cetirizine 10 MG tablet Commonly known as:  ZYRTEC Take 10 mg by mouth at bedtime.   fluticasone 50 MCG/ACT nasal spray Commonly known as:  FLONASE Place 1 spray into both nostrils 2 (two) times daily as needed for allergies or rhinitis.   Flutter Devi Use three times daily   LEG CRAMP RELIEF PO Take 2 tablets by mouth at bedtime. Hyland's   levothyroxine 125 MCG tablet Commonly known as:  SYNTHROID Take 1 tablet (125 mcg total) by mouth daily before breakfast.   losartan-hydrochlorothiazide 50-12.5 MG tablet Commonly known as:  HYZAAR Take 1 tablet by mouth daily.   magnesium oxide 400 MG tablet Commonly known as:  MAG-OX Take 400 mg by mouth at bedtime.   meloxicam 15 MG tablet Commonly known as:  MOBIC Take 1 tablet (15 mg total) by mouth daily.   omeprazole 40 MG capsule Commonly known as:  PRILOSEC TAKE 1 CAPSULE TWICE DAILY  ( APPOINTMENT IS NEEDED  )   PRESCRIPTION MEDICATION Oxygen 3.0 liters at night, prn use in day   SUPER B  COMPLEX/C PO Take 1 capsule by mouth at bedtime.        Objective:   BP (!) 137/93   Pulse 84   Temp (!) 97.5 F (36.4 C) (Oral)   Ht 5' 7.5" (1.715 m)   Wt 208 lb 3.2 oz (94.4 kg)   BMI 32.13 kg/m   Wt Readings from Last 3 Encounters:  09/12/18 208 lb 3.2 oz (94.4 kg)  02/13/18 203 lb (92.1 kg)  01/30/18 203 lb 9.6 oz (92.4 kg)    Physical Exam Vitals signs and nursing note reviewed.  Constitutional:      General: He is not in acute distress.    Appearance: He is well-developed. He is not diaphoretic.  Eyes:     General: No scleral icterus.    Conjunctiva/sclera: Conjunctivae normal.  Neck:     Thyroid: No thyromegaly.  Cardiovascular:     Rate and Rhythm: Normal rate  and regular rhythm.     Heart sounds: Normal heart sounds. No murmur.  Pulmonary:     Effort: Pulmonary effort is normal. No respiratory distress.     Breath sounds: Normal breath sounds. No wheezing.  Musculoskeletal: Normal range of motion.        General: No swelling or tenderness (no tenderness in knee on exam).  Skin:    General: Skin is warm and dry.     Findings: No rash.  Neurological:     Mental Status: He is alert and oriented to person, place, and time.     Coordination: Coordination normal.  Psychiatric:        Behavior: Behavior normal.     Assessment & Plan:   Problem List Items Addressed This Visit      Cardiovascular and Mediastinum   Hypertension   Relevant Medications   losartan-hydrochlorothiazide (HYZAAR) 50-12.5 MG tablet   Other Relevant Orders   CMP14+EGFR (Completed)     Respiratory   Obstructive chronic bronchitis with exacerbation Gold B Copd   Relevant Medications   budesonide-formoterol (SYMBICORT) 160-4.5 MCG/ACT inhaler   albuterol (VENTOLIN HFA) 108 (90 Base) MCG/ACT inhaler   fluticasone (FLONASE) 50 MCG/ACT nasal spray   tiotropium (SPIRIVA HANDIHALER) 18 MCG inhalation capsule     Digestive   GERD (gastroesophageal reflux disease)   Relevant  Medications   omeprazole (PRILOSEC) 40 MG capsule   Other Relevant Orders   CBC with Differential/Platelet (Completed)     Endocrine   Hypothyroidism - Primary (Chronic)   Relevant Medications   levothyroxine (SYNTHROID) 125 MCG tablet   Other Relevant Orders   CBC with Differential/Platelet (Completed)   TSH (Completed)     Genitourinary   Chronic kidney disease (CKD), stage III (moderate) (HCC)   Relevant Orders   CMP14+EGFR (Completed)     Other   Hyperlipidemia LDL goal <130   Relevant Medications   losartan-hydrochlorothiazide (HYZAAR) 50-12.5 MG tablet   Other Relevant Orders   Lipid panel (Completed)    Other Visit Diagnoses    Primary osteoarthritis of left knee       Relevant Medications   meloxicam (MOBIC) 15 MG tablet   traMADol (ULTRAM) 50 MG tablet     will give meloxicam and tramadol for arthritis  Continue thyroid and bp medicine  Follow up plan: Return in about 6 months (around 03/15/2019), or if symptoms worsen or fail to improve, for thyroid and htn and copd.  Counseling provided for all of the vaccine components No orders of the defined types were placed in this encounter.   Caryl Pina, MD [facility] 09/12/2018, 4:05 PM

## 2018-09-13 LAB — CBC WITH DIFFERENTIAL/PLATELET
Basophils Absolute: 0.1 10*3/uL (ref 0.0–0.2)
Basos: 0 %
EOS (ABSOLUTE): 0.1 10*3/uL (ref 0.0–0.4)
Eos: 1 %
Hematocrit: 46.6 % (ref 37.5–51.0)
Hemoglobin: 16.3 g/dL (ref 13.0–17.7)
Immature Grans (Abs): 0.1 10*3/uL (ref 0.0–0.1)
Immature Granulocytes: 1 %
Lymphocytes Absolute: 3.5 10*3/uL — ABNORMAL HIGH (ref 0.7–3.1)
Lymphs: 32 %
MCH: 32.2 pg (ref 26.6–33.0)
MCHC: 35 g/dL (ref 31.5–35.7)
MCV: 92 fL (ref 79–97)
Monocytes Absolute: 0.8 10*3/uL (ref 0.1–0.9)
Monocytes: 8 %
Neutrophils Absolute: 6.5 10*3/uL (ref 1.4–7.0)
Neutrophils: 58 %
Platelets: 195 10*3/uL (ref 150–450)
RBC: 5.06 x10E6/uL (ref 4.14–5.80)
RDW: 12.6 % (ref 11.6–15.4)
WBC: 11.1 10*3/uL — ABNORMAL HIGH (ref 3.4–10.8)

## 2018-09-13 LAB — LIPID PANEL
Chol/HDL Ratio: 2.1 ratio (ref 0.0–5.0)
Cholesterol, Total: 100 mg/dL (ref 100–199)
HDL: 48 mg/dL (ref >39)
LDL Calculated: 26 mg/dL (ref 0–99)
Triglycerides: 132 mg/dL (ref 0–149)
VLDL Cholesterol Cal: 26 mg/dL (ref 5–40)

## 2018-09-13 LAB — CMP14+EGFR
ALT: 28 IU/L (ref 0–44)
AST: 29 IU/L (ref 0–40)
Albumin/Globulin Ratio: 2 (ref 1.2–2.2)
Albumin: 4.7 g/dL (ref 3.7–4.7)
Alkaline Phosphatase: 84 IU/L (ref 39–117)
BUN/Creatinine Ratio: 9 — ABNORMAL LOW (ref 10–24)
BUN: 16 mg/dL (ref 8–27)
Bilirubin Total: 0.5 mg/dL (ref 0.0–1.2)
CO2: 21 mmol/L (ref 20–29)
Calcium: 9.5 mg/dL (ref 8.6–10.2)
Chloride: 102 mmol/L (ref 96–106)
Creatinine, Ser: 1.79 mg/dL — ABNORMAL HIGH (ref 0.76–1.27)
GFR calc Af Amer: 43 mL/min/{1.73_m2} — ABNORMAL LOW (ref >59)
GFR calc non Af Amer: 37 mL/min/{1.73_m2} — ABNORMAL LOW (ref >59)
Globulin, Total: 2.3 g/dL (ref 1.5–4.5)
Glucose: 79 mg/dL (ref 65–99)
Potassium: 4.1 mmol/L (ref 3.5–5.2)
Sodium: 140 mmol/L (ref 134–144)
Total Protein: 7 g/dL (ref 6.0–8.5)

## 2018-09-13 LAB — TSH: TSH: 3.38 u[IU]/mL (ref 0.450–4.500)

## 2018-09-30 DIAGNOSIS — J449 Chronic obstructive pulmonary disease, unspecified: Secondary | ICD-10-CM | POA: Diagnosis not present

## 2018-10-30 DIAGNOSIS — J449 Chronic obstructive pulmonary disease, unspecified: Secondary | ICD-10-CM | POA: Diagnosis not present

## 2018-11-25 ENCOUNTER — Telehealth: Payer: Self-pay | Admitting: Family Medicine

## 2018-11-25 NOTE — Chronic Care Management (AMB) (Signed)
°  Chronic Care Management   Outreach Note  11/25/2018 Name: Antonio Bowen MRN: [id number] DOB: March 09, 1946  Referred by: [name], [name], MD Reason for referral : Chronic Care Management (Initial CCM outreach was unsuccessful. )   An unsuccessful telephone outreach was attempted today. The patient was referred to the case management team by for assistance with chronic care management and care coordination.   Follow Up Plan: A HIPPA compliant phone message was left for the patient providing contact information and requesting a return call.  The care management team will reach out to the patient again over the next 7 days.  If patient returns call to provider office, please advise to call [place] [facility] [place]  [email or web address]   ??[phone]

## 2018-11-26 NOTE — Chronic Care Management (AMB) (Signed)
Chronic Care Management   Note  11/26/2018 Name: Antonio Bowen MRN: [id number] DOB: May 31, 1945  Antonio Bowen is a 73 y.o. year old male who is a primary care patient of [name], [name], MD. I reached out to Antonio Bowen by phone today in response to a referral sent by Antonio Bowen's health plan.    Antonio Bowen was given information about Chronic Care Management services today including:  1. CCM service includes personalized support from designated clinical staff supervised by his physician, including individualized plan of care and coordination with other care providers 2. 24/7 contact phone numbers for assistance for urgent and routine care needs. 3. Service will only be billed when office clinical staff spend 20 minutes or more in a month to coordinate care. 4. Only one practitioner may furnish and bill the service in a calendar month. 5. The patient may stop CCM services at any time (effective at the end of the month) by phone call to the office staff. 6. The patient will be responsible for cost sharing (co-pay) of up to 20% of the service fee (after annual deductible is met).  Patient agreed to services and verbal consent obtained.   Follow up plan: Telephone appointment with CCM team member scheduled for:12/01/2018  [place]  [email or web address]   ??[phone]

## 2018-11-30 DIAGNOSIS — J449 Chronic obstructive pulmonary disease, unspecified: Secondary | ICD-10-CM | POA: Diagnosis not present

## 2018-12-01 ENCOUNTER — Ambulatory Visit (INDEPENDENT_AMBULATORY_CARE_PROVIDER_SITE_OTHER): Payer: Medicare HMO | Admitting: *Deleted

## 2018-12-01 DIAGNOSIS — Z9981 Dependence on supplemental oxygen: Secondary | ICD-10-CM

## 2018-12-01 DIAGNOSIS — J449 Chronic obstructive pulmonary disease, unspecified: Secondary | ICD-10-CM | POA: Diagnosis not present

## 2018-12-01 DIAGNOSIS — I10 Essential (primary) hypertension: Secondary | ICD-10-CM

## 2018-12-01 NOTE — Patient Instructions (Signed)
Visit Information  Goals Addressed            This Visit's Progress     Patient Stated   . "I need to increase my activity level" (pt-stated)       Current Barriers:  . Chronic Disease Management support and education needs related to physical fitness  Nurse Case Manager Clinical Goal(s):  Marland Kitchen Over the next 30 days, patient will work with [facility] to address needs related to physical activity level and physical fittness  Interventions:  . Advised patient to avoid outdoor activities when the temp and humidity are high and/or the air quality is poor.  . Discussed current physical activity level . Recommended that he resume indoor physical activity routine . Advised to stop if he has any SOB, dizziness, etc  Patient Self Care Activities:  . Performs ADL's independently . Performs IADL's independently  Initial goal documentation     . "I would like some help with my medication cost" (pt-stated)       Current Barriers:  Marland Kitchen Knowledge Deficits related to prescription assistance programs . Chronic Disease Management support and education needs related to prescription assistance  Nurse Case Manager Clinical Goal(s):  Marland Kitchen Over the next 10 days, patient will verbalize understanding of plan for prescription assistance . Over the next 14 days patient will work with [place] to complete medication assistance applications  Interventions:  . Care Guide referral for help with prescription assistance applications &/or Medicare Extra Help Program . RNCM will check office for samples of Symbicort  . RNCM will collaborate with [place] team regarding medication options andcost Patient Self Care Activities:  . Performs ADL's independently . Performs IADL's independently  Initial goal documentation     . "I would like to be able to travel some" (pt-stated)       Current Barriers:  . Chronic Disease Management support and education needs related to COPD and oxygen use  Nurse Case  Manager Clinical Goal(s):  Marland Kitchen Over the next 30 days, patient will work with [facility] to address needs related to oxygen use . Over the next 60 days, patient will have a plan in place for oxygen use while travelling   Interventions:  . Evaluation of current treatment plan related to COPD and patient's adherence to plan as established by provider. o 3L of continuous O2 at night via nasal canula.  Supplied by a large oxygen concentrator. He does not have a portable tank. o Has been told by [employer] that a portable oxygen concentrator wouldn't likely work for him because of his need for continuous supply at 3L.  Marland Kitchen RNCM will collaborate with PCP and [employer] to see what options are available  Patient Self Care Activities:  . Performs ADL's independently . Some difficulty with IADLs due to Covid-19 mask requirements  Initial goal documentation        Mr. Bloyd was given information about Chronic Care Management services today including:  1. CCM service includes personalized support from designated clinical staff supervised by his physician, including individualized plan of care and coordination with other care providers 2. 24/7 contact phone numbers for assistance for urgent and routine care needs. 3. Service will only be billed when office clinical staff spend 20 minutes or more in a month to coordinate care. 4. Only one practitioner may furnish and bill the service in a calendar month. 5. The patient may stop CCM services at any time (effective at the end of the month) by phone call  to the office staff. 6. The patient will be responsible for cost sharing (co-pay) of up to 20% of the service fee (after annual deductible is met).  Patient agreed to services and verbal consent obtained.   The patient verbalized understanding of instructions provided today and declined a print copy of patient instruction materials.   The care management team will reach out to the patient again over the next 7 days.     [name] BSN, RN-BC Embedded Chronic Care Manager [facility] / [place] Management Direct Dial: [phone]

## 2018-12-01 NOTE — Chronic Care Management (AMB) (Signed)
Chronic Care Management   Initial Visit Note  12/01/2018 Name: Antonio Bowen MRN: [id number] DOB: [birth year]  Referred by: [name], [name], MD Reason for referral : Chronic Care Management (RNCM)   Antonio Bowen is a 73 y.o. year old male who is a primary care patient of [name], [name], MD. The CCM team was consulted for assistance with chronic disease management and care coordination needs.   Review of patient status, including review of consultants reports, relevant laboratory and other test results, and collaboration with appropriate care team members and the patient's provider was performed as part of comprehensive patient evaluation and provision of chronic care management services.    SDOH (Social Determinants of Health) screening performed today. Identified challenges with: Social Connections Tobacco Use Physical Activity . See care plan for Physical Activity.   Subjective: "My breathing is worse in the heat and humidity and I haven't been very physically active the past 5 months. I was playing golf several days a week before Covid."  I spoke with Antonio Bowen by telephone today. His primary concerns were the cost of his inhalers, oxygen usage, and his decreased physical activity level. His generic medications are free through [facility] but the Symbicort and Albuterol ar $45 each. He stated that he would like to be able to go on vacation but he uses oxygen at night and does not have a portable concentrator. He has checked with [employer] and they did not believe that a portable concentrator would meet his oxygen requirements.  Objective:    CT CHEST WITH CONTRAST 08/28/2010 IMPRESSION:   1.  Bilateral upper lobe airspace consolidation, left greater than right.  Likely secondary to pneumonia.  This will need interval radiographic followup to ensure resolution and to rule out underlying malignancy. 2.  Emphysema. 3.  Small bilateral pulmonary nodules.  Similar in  appearance to 03/19/2005.  COPD Medications:   albuterol 108 (90 Base) MCG/ACT inhaler Commonly known as: Ventolin HFA INHALE TWO PUFFS INTO LUNGS EVERY 6 HOURS AS NEEDED FOR WHEEZING  Using 2-3 times daily  budesonide-formoterol 160-4.5 MCG/ACT inhaler Commonly known as: SYMBICORT Inhale 2 puffs into the lungs 2 (two) times daily.      Plan from visit with Dr Lake Bells 02/13/2018 COPD: Continue Symbicort Start taking Yupelri 1 time a day (pt did not purchase due to cost) Use albuterol as needed for chest tightness wheezing or shortness of breath Follow-up in 2 to 3 weeks to see how you are doing with the new medicine  (did not follow up with pulmonary. Has been seeing PCP for management)    Assessment:   . "I need to increase my activity level" (pt-stated)       Current Barriers:  . Chronic Disease Management support and education needs related to physical fitness  Nurse Case Manager Clinical Goal(s):  Marland Kitchen Over the next 30 days, patient will work with Antonio Bowen to address needs related to physical activity level and physical fittness  Interventions:  . Advised patient to avoid outdoor activities when the temp and humidity are high and/or the air quality is poor.  . Discussed current physical activity level . Recommended that he resume indoor physical activity routine . Advised to stop if he has any SOB, dizziness, etc  Patient Self Care Activities:  . Performs ADL's independently . Performs IADL's independently  Initial goal documentation     . "I would like some help with my medication cost" (pt-stated)       Current Barriers:  .  Knowledge Deficits related to prescription assistance programs . Chronic Disease Management support and education needs related to prescription assistance  Nurse Case Manager Clinical Goal(s):  Marland Kitchen Over the next 10 days, patient will verbalize understanding of plan for prescription assistance . Over the next 14 days patient will work with [employer] to complete medication assistance applications  Interventions:  . Care Guide referral for help with prescription assistance applications &/or Medicare Extra Help Program . RNCM will check office for samples of Symbicort . CM will collaborate with [place] team regarding medication options and cost  Patient Self Care Activities:  . Performs ADL's independently . Performs IADL's independently  Initial goal documentation     . "I would like to be able to travel some" (pt-stated)       Current Barriers:  . Chronic Disease Management support and education needs related to COPD and oxygen use  Nurse Case Manager Clinical Goal(s):  Marland Kitchen Over the next 30 days, patient will work with [facility] to address needs related to oxygen use . Over the next 60 days, patient will have a plan in place for oxygen use while travelling   Interventions:  . Evaluation of current treatment plan related to COPD and patient's adherence to plan as established by provider. o 3L of continuous O2 at night via nasal canula.  Supplied by a large oxygen concentrator. He does not have a portable tank. o Has been told by [employer] that a portable oxygen concentrator wouldn't likely work for him because of his need for continuous supply at 3L.  Marland Kitchen RNCM will collaborate with PCP and [employer] to see what options are available  Patient Self Care Activities:  . Performs ADL's independently . Some difficulty with IADLs due to Covid-19 mask requirements  Initial goal documentation          Plan: The care management team will reach out to the patient again over the next 30 days.    [name] BSN, RN-BC Embedded Chronic Care Manager [facility] / [place] Management Direct Dial: [phone]

## 2018-12-02 ENCOUNTER — Ambulatory Visit: Payer: Medicare HMO | Admitting: *Deleted

## 2018-12-02 DIAGNOSIS — I10 Essential (primary) hypertension: Secondary | ICD-10-CM

## 2018-12-02 DIAGNOSIS — J449 Chronic obstructive pulmonary disease, unspecified: Secondary | ICD-10-CM

## 2018-12-02 DIAGNOSIS — Z9981 Dependence on supplemental oxygen: Secondary | ICD-10-CM

## 2018-12-02 NOTE — Chronic Care Management (AMB) (Signed)
  Chronic Care Management   Care Coordination Note   12/02/2018 Name: Antonio Bowen MRN: [id number] DOB: 26-Mar-1946  Referred by: [name], [name], MD Reason for referral : Chronic Care Management [facility] Care Coordination)   Antonio Bowen is a 74 y.o. year old male who is a primary care patient of [name], [name], MD. The CCM team was consulted for assistance with chronic disease management and care coordination needs.    RNCM care coordination regarding portable oxygen concentrator and inhalers.   Goals Addressed            This Visit's Progress     Patient Stated   . "I would like some help with my medication cost" (pt-stated)       Current Barriers:  Marland Kitchen Knowledge Deficits related to prescription assistance programs . Chronic Disease Management support and education needs related to prescription assistance  Nurse Case Manager Clinical Goal(s):  Marland Kitchen Over the next 10 days, patient will verbalize understanding of plan for prescription assistance . Over the next 14 days patient will work with [name] to complete medication assistance applications  Interventions:  . Collaborated with Care Guide, [name], regarding medication assistance.  . No samples of symbicort available  Patient Self Care Activities:  . Performs ADL's independently . Performs IADL's independently  Please see past updates related to this goal by clicking on the "Past Updates" button in the selected goal      . "I would like to be able to travel some" (pt-stated)       Current Barriers:  . Chronic Disease Management support and education needs related to COPD and oxygen use  Nurse Case Manager Clinical Goal(s):  Marland Kitchen Over the next 30 days, patient will work with [facility] to address needs related to oxygen use . Over the next 60 days, patient will have a plan in place for oxygen use while travelling   Interventions:  . Evaluation of current treatment plan related to COPD and patient's adherence to  plan as established by provider. Marland Kitchen RNCM consulted with [name] [phone] and they advised that they do not carry any continuous flow portable concentrators.  [name] out to [place] [phone])  o They can special order a continuous flow portable concentrator that costs $1820. Patient would pay half up front and half when it arrives [employer] will not cover purchase price, only rental and they only stock pulse flow for rental. o Require a prescription  Patient Self Care Activities:  . Performs ADL's independently . Some difficulty with IADLs due to Covid-19 mask requirements  Please see past updates related to this goal by clicking on the "Past Updates" button in the selected goal         Follow Up Plan The care management team will reach out to the patient again over the next 14 days.   RNCM will mail patient information on portable oxygen concentrator   [name] BSN, RN-BC [place] / [place] Management Direct Dial: [phone]

## 2018-12-02 NOTE — Patient Instructions (Signed)
Visit Information  [facility] does not offer a portable oxygen concentrator with a continous flow of oxygen. [employer] in [place] does offer a special order unit that has a continuous flow.   Insurance covers the cost of renting a pulse flow unit but does not cover the cost of a portable continuous flow unit. You can purchase a SimplyGo Portable Oxygen Concentrator device for $1820 through [employer]. We would have to send them a prescription. They require half of the payment up front and then the other half when the device is picked up.                   Telephone #: [phone]   Goals Addressed      Patient Stated   . "I would like some help with my medication cost"        Current Barriers:  Marland Kitchen Knowledge Deficits related to prescription assistance programs . Chronic Disease Management support and education needs related to prescription assistance  Nurse Case Manager Clinical Goal(s):  Marland Kitchen Over the next 10 days, patient will verbalize understanding of plan for prescription assistance . Over the next 14 days patient will work with [place] to complete medication assistance applications  Interventions:  . Collaborated with Care Guide, [name], regarding medication assistance.  . No samples of symbicort available  Patient Self Care Activities:  . Performs ADL's independently . Performs IADL's independently  Please see past updates related to this goal by clicking on the "Past Updates" button in the selected goal      . "I would like to be able to travel some"       Current Barriers:  . Chronic Disease Management support and education needs related to COPD and oxygen use  Nurse Case Manager Clinical Goal(s):  Marland Kitchen Over the next 30 days, patient will work with [facility] to address needs related to oxygen use . Over the next 60 days, patient will have a plan in place for oxygen use while travelling   Interventions:  . Evaluation of current treatment plan related to  COPD and patient's adherence to plan as established by provider. Marland Kitchen RNCM consulted with [facility] [phone] and they advised that they do not carry any continuous flow portable concentrators.  [name] out to [place] [phone])  o They can special order a continuous flow portable concentrator that costs $1820. Patient would pay half up front and half when it arrives [employer] will not cover purchase price, only rental and they only stock pulse flow for rental. o Require a prescription  Patient Self Care Activities:  . Performs ADL's independently . Some difficulty with IADLs due to Covid-19 mask requirements  Please see past updates related to this goal by clicking on the "Past Updates" button in the selected goal       Print copy of patient instructions provided.    Follow Up Plan The care management team will reach out to the patient again over the next 14 days.    [name] BSN, RN-BC Embedded Chronic Care Manager [facility] Direct Dial: [phone]

## 2018-12-03 ENCOUNTER — Telehealth: Payer: Self-pay

## 2018-12-03 NOTE — Telephone Encounter (Signed)
12/03/2018 Left message on voicemail for patient to return my call regarding assistance with filling out prescription savings program applications for Ventolin and Symbicort. Ambrose Mantle [phone]

## 2018-12-05 ENCOUNTER — Ambulatory Visit: Payer: Self-pay | Admitting: *Deleted

## 2018-12-05 ENCOUNTER — Telehealth: Payer: Self-pay

## 2018-12-05 NOTE — Telephone Encounter (Signed)
12/04/2018 Spoke with patient about completing applications for Symbicort and Ventolin.  Informed patient that he would need to bring proof of out of pocket for medications this year and proof of income when he comes in to sign his applications. Emailed applications to [employer] to complete provider portions of applications. Ambrose Mantle [phone]

## 2018-12-09 ENCOUNTER — Ambulatory Visit: Payer: Medicare HMO | Admitting: *Deleted

## 2018-12-12 ENCOUNTER — Telehealth: Payer: Medicare HMO

## 2018-12-17 ENCOUNTER — Telehealth: Payer: Self-pay

## 2018-12-17 NOTE — Telephone Encounter (Signed)
Copied from [place]. Topic: Referral - Status >> Dec 17, 2018  3:33 AM Simone Curia D wrote: [id number] Spoke with patient's wife about patient coming in to sign paperwork for medication assistance and bring proof of out of pocked expenses for medication and proof of income with him. Next appt for Annual Wellness Visit 12/24/2018 @ 3:30pm

## 2018-12-23 ENCOUNTER — Telehealth: Payer: Medicare HMO

## 2018-12-24 ENCOUNTER — Ambulatory Visit (INDEPENDENT_AMBULATORY_CARE_PROVIDER_SITE_OTHER): Payer: Medicare HMO | Admitting: *Deleted

## 2018-12-24 DIAGNOSIS — Z0000 Encounter for general adult medical examination without abnormal findings: Secondary | ICD-10-CM | POA: Diagnosis not present

## 2018-12-24 NOTE — Progress Notes (Signed)
MEDICARE ANNUAL WELLNESS VISIT  12/24/2018  Telephone Visit Disclaimer This Medicare AWV was conducted by telephone due to national recommendations for restrictions regarding the COVID-19 Pandemic (e.g. social distancing).  I verified, using two identifiers, that I am speaking with Antonio Bowen or their authorized healthcare agent. I discussed the limitations, risks, security, and privacy concerns of performing an evaluation and management service by telephone and the potential availability of an in-person appointment in the future. The patient expressed understanding and agreed to proceed.   Subjective:  Antonio Bowen is a 73 y.o. male patient of [name], [name], MD who had a Medicare Annual Wellness Visit today via telephone. Antonio Bowen is Retired and lives with their spouse. he has 2 children. he reports that he is socially active and does interact with friends/family regularly. He is moderately physically active and enjoys golfing at least three times a week..  Patient Care Team: [name], [name], MD as PCP - General (Family Medicine) Ilean China, RN as Registered Nurse  Advanced Directives 12/24/2018 08/13/2017 03/26/2016 02/18/2015 01/14/2014 08/06/2013  Does Patient Have a Medical Advance Directive? No No No No No Patient does not have advance directive  Would patient like information on creating a medical advance directive? No - Patient declined Yes (MAU/Ambulatory/Procedural Areas - Information given) - No - patient declined information - -  Pre-existing out of facility DNR order (yellow form or pink MOST form) - - - - - No    Hospital Utilization Over the Past 12 Months: # of hospitalizations or ER visits: 0 # of surgeries: 0  Review of Systems    Patient reports that his overall health is unchanged compared to last year.  Patient Reported Readings (BP, Pulse, CBG, Weight, etc) none  Review of Systems: Complains of chronic hip and knee pain with some degree  of intermitten pain.  All other systems negative.  Pain Assessment Pain : 0-10 Pain Score: 4  Pain Type: Chronic pain Pain Location: Hip Pain Orientation: Right Pain Descriptors / Indicators: Aching Pain Frequency: Intermittent Pain Relieving Factors: Medicine.  Pain Relieving Factors: Medicine.  Current Medications & Allergies (verified) Allergies as of 12/24/2018      Reactions   Nuts Nausea And Vomiting   sweating   Other Nausea And Vomiting   Per patient, cannot take any narcotic. Nausea and vomiting sweating   Vicodin [hydrocodone-acetaminophen]    tachycardia   Codeine    Aspirin Other (See Comments)   Upset stomach, bleeding   Penicillins Hives, Rash   Has patient had a PCN reaction causing immediate rash, facial/tongue/throat swelling, SOB or lightheadedness with hypotension:No Has patient had a PCN reaction causing severe rash involving mucus membranes or skin necrosis:No Has patient had a PCN reaction that required hospitalization:No Has patient had a PCN reaction occurring within the last 10 years:Yes If all of the above answers are "NO", then may proceed with Cephalosporin use.      Medication List       Accurate as of December 24, 2018  4:22 PM. If you have any questions, ask your nurse or doctor.        acetaminophen 500 MG tablet Commonly known as: TYLENOL Take 1,000 mg by mouth 2 (two) times daily as needed (for pain.).   albuterol 108 (90 Base) MCG/ACT inhaler Commonly known as: Ventolin HFA INHALE TWO PUFFS INTO LUNGS EVERY 6 HOURS AS NEEDED FOR WHEEZING   budesonide-formoterol 160-4.5 MCG/ACT inhaler Commonly known as: SYMBICORT Inhale 2 puffs  into the lungs 2 (two) times daily.   cetirizine 10 MG tablet Commonly known as: ZYRTEC Take 10 mg by mouth at bedtime.   fluticasone 50 MCG/ACT nasal spray Commonly known as: FLONASE Place 1 spray into both nostrils 2 (two) times daily as needed for allergies or rhinitis.   Flutter Devi Use three  times daily   LEG CRAMP RELIEF PO Take 2 tablets by mouth at bedtime. Hyland's   levothyroxine 125 MCG tablet Commonly known as: SYNTHROID Take 1 tablet (125 mcg total) by mouth daily before breakfast.   losartan-hydrochlorothiazide 50-12.5 MG tablet Commonly known as: HYZAAR Take 1 tablet by mouth daily.   magnesium oxide 400 MG tablet Commonly known as: MAG-OX Take 400 mg by mouth at bedtime.   meloxicam 15 MG tablet Commonly known as: MOBIC Take 1 tablet (15 mg total) by mouth daily.   omeprazole 40 MG capsule Commonly known as: PRILOSEC TAKE 1 CAPSULE TWICE DAILY   PRESCRIPTION MEDICATION Oxygen 3.0 liters at night, prn use in day   SUPER B COMPLEX/C PO Take 1 capsule by mouth at bedtime.   traMADol 50 MG tablet Commonly known as: ULTRAM Take 1 tablet (50 mg total) by mouth every 8 (eight) hours as needed.       History (reviewed): Past Medical History:  Diagnosis Date  . Allergic rhinitis   . Arthritis   . BOOP (bronchiolitis obliterans with organizing pneumonia) ([place])   . Bronchitis   . Colon polyps   . COPD (chronic obstructive pulmonary disease) ([place])   . Dyspnea    with exertion   . Dysrhythmia    skipped beats- followed by Dr Angelena Form   . GERD (gastroesophageal reflux disease)   . Hypertension   . Hypothyroidism   . Kidney stone   . Low back pain   . Oxygen dependent    uses 3L/[place] at nite occasional during day if needed   . Stroke [facility])    hx of 2 strokes which patient did not know he had had   . Thyroid disease   . Tobacco dependence    Past Surgical History:  Procedure Laterality Date  . broncoscopy     . carpel tunnal     left  . COLONOSCOPY     x 4  . DUPUYTREN CONTRACTURE RELEASE     Left  . ESOPHAGOGASTRODUODENOSCOPY (EGD) WITH PROPOFOL N/A 03/26/2016   Procedure: ESOPHAGOGASTRODUODENOSCOPY (EGD) WITH PROPOFOL;  Surgeon: Irene Shipper, MD;  Location: [facility];  Service: Endoscopy;  Laterality: N/A;  .  ESOPHAGOGASTRODUODENOSCOPY ENDOSCOPY     x 3  . FACIAL FRACTURE SURGERY    . OTHER SURGICAL HISTORY     facial surgery  . TOTAL HIP ARTHROPLASTY  01/2011   Right hip, [place]  . ULNAR NERVE TRANSPOSITION     left   Family History  Problem Relation Age of Onset  . Arthritis Father   . Stroke Father 61  . Cancer Maternal Grandfather   . Prostate cancer Maternal Grandfather   . Stomach cancer Maternal Grandmother   . Cancer Paternal Grandmother   . Colon cancer Other        mat 1st cousin  . Cancer Mother   . Healthy Daughter   . Healthy Son    Social History   Socioeconomic History  . Marital status: Married    Spouse name: Not on file  . Number of children: 2  . Years of education: 71  . Highest education level: 11th grade  Occupational History  . 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 Financial resource strain: Not hard at all  . Food insecurity    Worry: Never true    Inability: Never true  . Transportation needs    Medical: No    Non-medical: No  Tobacco Use  . Smoking status: Former Smoker    Packs/day: 1.00    Years: 50.00    Pack years: 50.00    Types: Cigarettes    Quit date: 07/30/2010    Years since quitting: 8.4  . Smokeless tobacco: Current User    Types: Snuff  Substance and Sexual Activity  . Alcohol use: Yes    Comment: rare  . Drug use: No  . Sexual activity: Not on file  Lifestyle  . Physical activity    Days per week: 3 days    Minutes per session: 130 min  . Stress: Not at all  Relationships  . Social connections    Talks on phone: More than three times a week    Gets together: More than three times a week    Attends religious service: Never    Active member of club or organization: No    Attends meetings of clubs or organizations: Never    Relationship status: Married  Other Topics Concern  . Not on file  Social History Narrative  . Not on file     Activities of Daily Living In your present state of health, do you have any difficulty performing the following activities: 12/24/2018 12/01/2018  Hearing? Tempie Donning  Vision? N N  Difficulty concentrating or making decisions? N N  Walking or climbing stairs? Y Y  Comment Has hip and knee pain. COPD  Dressing or bathing? N N  Doing errands, shopping? N Y  Comment - has difficulty because COPD makes wearing a mask difficult  Preparing Food and eating ? N -  Using the Toilet? N -  In the past six months, have you accidently leaked urine? N -  Do you have problems with loss of bowel control? N -  Managing your Finances? N -  Housekeeping or managing your Housekeeping? N -  Some recent data might be hidden    Patient Education/ Literacy How often do you need to have someone help you when you read instructions, pamphlets, or other written materials from your doctor or pharmacy?: 1 - Never  Exercise Current Exercise Habits: Home exercise routine(Helps around the house and goes golfing at least three times weekly lasting for hours each trip.), Type of exercise: walking, Time (Minutes): > 60, Frequency (Times/Week): 3, Weekly Exercise (Minutes/Week): 0, Intensity: Mild, Exercise limited by: respiratory conditions(s)(wears oxygen at night and will need to rest during the day if he over exerts himself.)  Diet Patient reports consuming 3 meals a day and 0 snack(s) a day Patient reports that his primary diet is: Regular Patient reports that she does have regular access to food.   Depression Screen PHQ 2/9 Scores 12/24/2018 12/01/2018 09/12/2018 01/30/2018 08/13/2017 06/28/2017 05/24/2017  PHQ - 2 Score 0 0 0 0 0 0 0     Fall Risk Fall Risk  12/24/2018 12/01/2018 09/12/2018 01/30/2018 08/13/2017  Falls in the past year? 0 0 0 No Yes  Comment - - - - reaching for something that was out of reach  Number falls in past yr: - 0 - - 1  Injury with Fall? -  0 - - Yes  Comment - - - - scraped left elbow, bruised left  side of ribs and right lower leg  Risk Factor Category  - - - - High Fall Risk  Risk for fall due to : - - - - History of fall(s)  Follow up - - - - Falls prevention discussed     Objective:  Antonio Bowen seemed alert and oriented and he participated appropriately during our telephone visit.  Blood Pressure Weight BMI  BP Readings from Last 3 Encounters:  09/12/18 (!) 137/93  02/13/18 (!) 145/83  01/30/18 127/78   Wt Readings from Last 3 Encounters:  09/12/18 208 lb 3.2 oz (94.4 kg)  02/13/18 203 lb (92.1 kg)  01/30/18 203 lb 9.6 oz (92.4 kg)   BMI Readings from Last 1 Encounters:  09/12/18 32.13 kg/m    *Unable to obtain current vital signs, weight, and BMI due to telephone visit type  Hearing/Vision  . Krystian did not seem to have difficulty with hearing/understanding during the telephone conversation . Reports that he has not had a formal eye exam by an eye care professional within the past year . Reports that he has had a formal hearing evaluation within the past year *Unable to fully assess hearing and vision during telephone visit type  Cognitive Function: 6CIT Screen 12/24/2018  What Year? 0 points  What month? 0 points  What time? 0 points  Count back from 20 0 points  Months in reverse 0 points  Repeat phrase 0 points  Total Score 0   (Normal:0-7, Significant for Dysfunction: >8)  Normal Cognitive Function Screening: Yes   Immunization & Health Maintenance Record Immunization History  Administered Date(s) Administered  . Influenza Split 01/24/2012  . Influenza, High Dose Seasonal PF 01/30/2018  . Influenza,inj,Quad PF,6+ Mos 03/09/2013, 01/14/2014, 02/21/2015, 02/09/2016, 03/07/2017  . Pneumococcal Conjugate-13 01/14/2014  . Pneumococcal Polysaccharide-23 01/24/2012  . Td 07/03/2006  . Tdap 07/03/2006, 01/05/2011  . Zoster 01/05/2011    Health Maintenance  Topic Date Due  . COLONOSCOPY  07/09/2017  . INFLUENZA VACCINE  11/29/2018  .  TETANUS/TDAP  01/04/2021  . Hepatitis C Screening  Completed  . PNA vac Low Risk Adult  Completed       Assessment  This is a routine wellness examination for Antonio Bowen.  Health Maintenance: Due or Overdue Health Maintenance Due  Topic Date Due  . COLONOSCOPY  07/09/2017  . INFLUENZA VACCINE  11/29/2018    Antonio Bowen does not need a referral for Community Assistance: Care Management:   no Social Work:    no Prescription Assistance:  no Nutrition/Diabetes Education:  no   Plan:  Personalized Goals Goals Addressed            This Visit's Progress   . Obtain Annual Eye (retinal)  Exam       . Prevent falls        Personalized Health Maintenance & Screening Recommendations  Colorectal cancer screening Dexascan, Shingrix, Opthamology Exam  Lung Cancer Screening Recommended: no (Low Dose CT Chest recommended if Age 26-80 years, 30 pack-year currently smoking OR have quit w/in past 15 years) Hepatitis C Screening recommended: no HIV Screening recommended: no  Advanced Directives: Written information was not prepared per patient's request.  Referrals & Orders No orders of the defined types were placed in this encounter.   Follow-up Plan . Follow-up with [name], [name], MD as planned . Schedule  .    I have  personally reviewed and noted the following in the patient's chart:   . Medical and social history . Use of alcohol, tobacco or illicit drugs  . Current medications and supplements . Functional ability and status . Nutritional status . Physical activity . Advanced directives . List of other physicians . Hospitalizations, surgeries, and ER visits in previous 12 months . Vitals . Screenings to include cognitive, depression, and falls . Referrals and appointments  In addition, I have reviewed and discussed with Antonio Bowen certain preventive protocols, quality metrics, and best practice recommendations. A written personalized care  plan for preventive services as well as general preventive health recommendations is available and can be mailed to the patient at his request.      Johnell Comings LPN  [phone]

## 2018-12-31 DIAGNOSIS — J449 Chronic obstructive pulmonary disease, unspecified: Secondary | ICD-10-CM | POA: Diagnosis not present

## 2019-01-20 ENCOUNTER — Telehealth: Payer: Self-pay

## 2019-01-20 NOTE — Telephone Encounter (Signed)
12/17/2018 Spoke with patient's wife about patient coming in to sign paperwork for medication assistance and bring proof of out of pocket expenses for medication and proof of income with him. Ambrose Mantle [phone]

## 2019-01-30 DIAGNOSIS — J449 Chronic obstructive pulmonary disease, unspecified: Secondary | ICD-10-CM | POA: Diagnosis not present

## 2019-03-02 DIAGNOSIS — J449 Chronic obstructive pulmonary disease, unspecified: Secondary | ICD-10-CM | POA: Diagnosis not present

## 2019-03-20 ENCOUNTER — Other Ambulatory Visit: Payer: Self-pay | Admitting: Family Medicine

## 2019-04-01 DIAGNOSIS — J449 Chronic obstructive pulmonary disease, unspecified: Secondary | ICD-10-CM | POA: Diagnosis not present

## 2019-05-02 DIAGNOSIS — J449 Chronic obstructive pulmonary disease, unspecified: Secondary | ICD-10-CM | POA: Diagnosis not present

## 2019-06-02 DIAGNOSIS — J449 Chronic obstructive pulmonary disease, unspecified: Secondary | ICD-10-CM | POA: Diagnosis not present

## 2019-06-08 ENCOUNTER — Telehealth: Payer: Self-pay | Admitting: Family Medicine

## 2019-06-08 NOTE — Telephone Encounter (Signed)
I have not seen it yet but I am more than happy to sign anything that would help the patient get medications.  Please let me know when it is found and we can get it signed filled out by likely [facility]

## 2019-06-08 NOTE — Telephone Encounter (Signed)
Patient aware and verbalized understanding. °

## 2019-06-08 NOTE — Telephone Encounter (Signed)
Pt called to let Dr [name] know that he is currently paying $45 for his Simbicort Rx. Says his insurance Personnel officer) has a COPD Program available where pt can get a 90 day supply of this medication for $0. Pt said that [facility] needs Dr Neldon Mc approval for this. Would save pt a lot of money. Said lady from [facility] told pt that she faxed request to [place] this morning.

## 2019-06-09 ENCOUNTER — Other Ambulatory Visit: Payer: Self-pay | Admitting: *Deleted

## 2019-06-09 DIAGNOSIS — J441 Chronic obstructive pulmonary disease with (acute) exacerbation: Secondary | ICD-10-CM

## 2019-06-09 MED ORDER — BUDESONIDE-FORMOTEROL FUMARATE 160-4.5 MCG/ACT IN AERO: 2.0000 | INHALATION_SPRAY | Freq: Two times a day (BID) | RESPIRATORY_TRACT | 0 refills | Status: DC

## 2019-06-19 ENCOUNTER — Other Ambulatory Visit: Payer: Self-pay | Admitting: Family Medicine

## 2019-06-19 DIAGNOSIS — I10 Essential (primary) hypertension: Secondary | ICD-10-CM

## 2019-06-19 DIAGNOSIS — K219 Gastro-esophageal reflux disease without esophagitis: Secondary | ICD-10-CM

## 2019-06-30 DIAGNOSIS — J449 Chronic obstructive pulmonary disease, unspecified: Secondary | ICD-10-CM | POA: Diagnosis not present

## 2019-07-31 DIAGNOSIS — J449 Chronic obstructive pulmonary disease, unspecified: Secondary | ICD-10-CM | POA: Diagnosis not present

## 2019-08-02 ENCOUNTER — Emergency Department (HOSPITAL_COMMUNITY): Payer: Medicare HMO

## 2019-08-02 ENCOUNTER — Emergency Department (HOSPITAL_COMMUNITY)
Admission: EM | Admit: 2019-08-02 | Discharge: 2019-08-02 | Disposition: A | Discharge status: Home / Self Care | Payer: Medicare HMO | Attending: Emergency Medicine | Admitting: Emergency Medicine

## 2019-08-02 ENCOUNTER — Other Ambulatory Visit: Payer: Self-pay

## 2019-08-02 ENCOUNTER — Encounter (HOSPITAL_COMMUNITY): Payer: Self-pay | Admitting: Emergency Medicine

## 2019-08-02 DIAGNOSIS — Z96641 Presence of right artificial hip joint: Secondary | ICD-10-CM | POA: Diagnosis not present

## 2019-08-02 DIAGNOSIS — I129 Hypertensive chronic kidney disease with stage 1 through stage 4 chronic kidney disease, or unspecified chronic kidney disease: Secondary | ICD-10-CM | POA: Insufficient documentation

## 2019-08-02 DIAGNOSIS — Z79899 Other long term (current) drug therapy: Secondary | ICD-10-CM | POA: Diagnosis not present

## 2019-08-02 DIAGNOSIS — N183 Chronic kidney disease, stage 3 unspecified: Secondary | ICD-10-CM | POA: Insufficient documentation

## 2019-08-02 DIAGNOSIS — Z8673 Personal history of transient ischemic attack (TIA), and cerebral infarction without residual deficits: Secondary | ICD-10-CM | POA: Insufficient documentation

## 2019-08-02 DIAGNOSIS — Z87891 Personal history of nicotine dependence: Secondary | ICD-10-CM | POA: Insufficient documentation

## 2019-08-02 DIAGNOSIS — R079 Chest pain, unspecified: Secondary | ICD-10-CM | POA: Diagnosis not present

## 2019-08-02 DIAGNOSIS — R1310 Dysphagia, unspecified: Secondary | ICD-10-CM | POA: Diagnosis not present

## 2019-08-02 DIAGNOSIS — J449 Chronic obstructive pulmonary disease, unspecified: Secondary | ICD-10-CM | POA: Insufficient documentation

## 2019-08-02 DIAGNOSIS — E039 Hypothyroidism, unspecified: Secondary | ICD-10-CM | POA: Diagnosis not present

## 2019-08-02 LAB — CBC
HCT: 51.7 % (ref 39.0–52.0)
Hemoglobin: 17 g/dL (ref 13.0–17.0)
MCH: 31.2 pg (ref 26.0–34.0)
MCHC: 32.9 g/dL (ref 30.0–36.0)
MCV: 94.9 fL (ref 80.0–100.0)
Platelets: 194 K/uL (ref 150–400)
RBC: 5.45 MIL/uL (ref 4.22–5.81)
RDW: 13.5 % (ref 11.5–15.5)
WBC: 14 K/uL — ABNORMAL HIGH (ref 4.0–10.5)
nRBC: 0 % (ref 0.0–0.2)

## 2019-08-02 LAB — BASIC METABOLIC PANEL
Anion gap: 11 (ref 5–15)
BUN: 15 mg/dL (ref 8–23)
CO2: 28 mmol/L (ref 22–32)
Calcium: 9.5 mg/dL (ref 8.9–10.3)
Chloride: 102 mmol/L (ref 98–111)
Creatinine, Ser: 1.66 mg/dL — ABNORMAL HIGH (ref 0.61–1.24)
GFR calc Af Amer: 47 mL/min — ABNORMAL LOW (ref >60)
GFR calc non Af Amer: 40 mL/min — ABNORMAL LOW (ref >60)
Glucose, Bld: 127 mg/dL — ABNORMAL HIGH (ref 70–99)
Potassium: 3.9 mmol/L (ref 3.5–5.1)
Sodium: 141 mmol/L (ref 135–145)

## 2019-08-02 LAB — TROPONIN I (HIGH SENSITIVITY)
Troponin I (High Sensitivity): 7 ng/L
Troponin I (High Sensitivity): 7 ng/L (ref <18)

## 2019-08-02 MED ORDER — IOHEXOL 350 MG/ML SOLN
80.0000 mL | Freq: Once | INTRAVENOUS | Status: AC | PRN
  Administered 2019-08-02: 13:00:00 80 mL via INTRAVENOUS

## 2019-08-02 MED ORDER — ONDANSETRON HCL 4 MG/2ML IJ SOLN
4.0000 mg | Freq: Once | INTRAMUSCULAR | Status: AC
  Administered 2019-08-02: 4 mg via INTRAVENOUS
  Filled 2019-08-02: qty 2

## 2019-08-02 MED ORDER — PANTOPRAZOLE SODIUM 40 MG IV SOLR
40.0000 mg | Freq: Once | INTRAVENOUS | Status: AC
  Administered 2019-08-02: 40 mg via INTRAVENOUS
  Filled 2019-08-02: qty 40

## 2019-08-02 MED ORDER — SUCRALFATE 1 GM/10ML PO SUSP: 1.0000 g | Freq: Three times a day (TID) | ORAL | 0 refills | Status: DC

## 2019-08-02 NOTE — Discharge Instructions (Addendum)
Continue your antacid medications and start taking the sucralfate.  Follow-up with your GI doctor as we discussed.  Return to the ED as needed for worsening symptoms.  Avoid citrus and sodas.  Avoid spicy foods.  Try a soft diet in the meantime

## 2019-08-02 NOTE — ED Triage Notes (Signed)
Patient arrives POV c/o chest pain x 2 days but worsening this am. States he is having difficulty swallowing once the pills get down the medicine feels like it is sitting in his esophagus and then he throws it up. Could not get BP medication down PTA. Reports an episode of bloody emesis this am. Mid chest pain described as an ice pick jamming into his chest radiating into back.

## 2019-08-02 NOTE — ED Notes (Signed)
Patient transported to X-ray 

## 2019-08-02 NOTE — ED Provider Notes (Signed)
[facility] EMERGENCY DEPARTMENT Provider Note   CSN: [id number] Arrival date & time: 08/02/19  1032     History Chief Complaint  Patient presents with  . Chest Pain    Antonio Bowen is a 73 y.o. male.  HPI  HPI: A 74 year old patient with a history of hypertension and obesity presents for evaluation of chest pain. Initial onset of pain was approximately 1-3 hours ago. The patient's chest pain is sharp and is not worse with exertion. The patient complains of nausea. The patient's chest pain is middle- or left-sided, is not well-localized, is not described as heaviness/pressure/tightness and does not radiate to the arms/jaw/neck. The patient denies diaphoresis. The patient has no history of stroke, has no history of peripheral artery disease, has not smoked in the past 90 days, denies any history of treated diabetes, has no relevant family history of coronary artery disease (first degree relative at less than age 54) and has no history of hypercholesterolemia.  Patient states he developed sharp pain in his chest this morning.  Started within the last few hours.  It was very intense but seems to be decreasing in severity at this point.  Patient states that he felt a sharp pain in his chest.  It radiated to his back.  He tried to drink some soda to see if that would help.  Patient felt that got stuck and then he vomited.  He noted blood in the vomit.  Patient states he cannot take any narcotics because they cause severe nausea and vomiting for him. Past Medical History:  Diagnosis Date  . Allergic rhinitis   . Arthritis   . BOOP (bronchiolitis obliterans with organizing pneumonia) ([place])   . Bronchitis   . Colon polyps   . COPD (chronic obstructive pulmonary disease) ([place])   . Dyspnea    with exertion   . Dysrhythmia    skipped beats- followed by Dr Angelena Form   . GERD (gastroesophageal reflux disease)   . Hypertension   . Hypothyroidism   . Kidney stone   . Low back  pain   . Oxygen dependent    uses 3L/[place] at nite occasional during day if needed   . Stroke [facility])    hx of 2 strokes which patient did not know he had had   . Thyroid disease   . Tobacco dependence     Patient Active Problem List   Diagnosis Date Noted  . Chronic kidney disease (CKD), stage III (moderate) 09/12/2018  . Lateral epicondylitis 07/03/2017  . Pain in joint of left elbow 06/05/2017  . Dysphagia 02/23/2016  . Schatzki's ring 02/23/2016  . Oxygen dependent 02/23/2016  . Hyperlipidemia LDL goal <130 06/01/2015  . BOOP (bronchiolitis obliterans with organizing pneumonia) ([place]) 09/29/2010  . Hearing loss, conductive, bilateral 09/21/2010  . Hypothyroidism 09/21/2010  . Obstructive chronic bronchitis with exacerbation Gold B Copd   . Hypertension   . GERD (gastroesophageal reflux disease)   . Low back pain     Past Surgical History:  Procedure Laterality Date  . broncoscopy     . carpel tunnal     left  . COLONOSCOPY     x 4  . DUPUYTREN CONTRACTURE RELEASE     Left  . ESOPHAGOGASTRODUODENOSCOPY (EGD) WITH PROPOFOL N/A 03/26/2016   Procedure: ESOPHAGOGASTRODUODENOSCOPY (EGD) WITH PROPOFOL;  Surgeon: Irene Shipper, MD;  Location: [facility] ENDOSCOPY;  Service: Endoscopy;  Laterality: N/A;  . ESOPHAGOGASTRODUODENOSCOPY ENDOSCOPY     x 3  .  FACIAL FRACTURE SURGERY    . OTHER SURGICAL HISTORY     facial surgery  . TOTAL HIP ARTHROPLASTY  01/2011   Right hip, [place]  . ULNAR NERVE TRANSPOSITION     left       Family History  Problem Relation Age of Onset  . Arthritis Father   . Stroke Father 74  . Cancer Maternal Grandfather   . Prostate cancer Maternal Grandfather   . Stomach cancer Maternal Grandmother   . Cancer Paternal Grandmother   . Colon cancer Other        mat 1st cousin  . Cancer Mother   . Healthy Daughter   . Healthy Son     Social History   Tobacco Use  . Smoking status: Former Smoker    Packs/day: 1.00    Years: 50.00    Pack years: 50.00     Types: Cigarettes    Quit date: 07/30/2010    Years since quitting: 9.0  . Smokeless tobacco: Current User    Types: Snuff  Substance Use Topics  . Alcohol use: Yes    Comment: rare  . Drug use: No    Home Medications Prior to Admission medications   Medication Sig Start Date End Date Taking? Authorizing Provider  acetaminophen (TYLENOL) 500 MG tablet Take 1,000 mg by mouth 2 (two) times daily as needed (for pain.).    [provider]  albuterol (VENTOLIN HFA) 108 (90 Base) MCG/ACT inhaler INHALE TWO PUFFS INTO LUNGS EVERY 6 HOURS AS NEEDED FOR WHEEZING 09/12/18   [name], [name], MD  budesonide-formoterol (SYMBICORT) 160-4.5 MCG/ACT inhaler Inhale 2 puffs into the lungs 2 (two) times daily. 06/09/19   [name], [name], MD  cetirizine (ZYRTEC) 10 MG tablet Take 10 mg by mouth at bedtime.     [provider]  fluticasone (FLONASE) 50 MCG/ACT nasal spray Place 1 spray into both nostrils 2 (two) times daily as needed for allergies or rhinitis. 09/12/18   [name], [name], MD  Homeopathic Products (LEG CRAMP RELIEF PO) Take 2 tablets by mouth at bedtime. Hyland's    [provider]  levothyroxine (SYNTHROID) 125 MCG tablet Take 1 tablet (125 mcg total) by mouth daily before breakfast. 09/12/18   [name], [name], MD  losartan-hydrochlorothiazide (HYZAAR) 50-12.5 MG tablet TAKE 1 TABLET EVERY DAY 06/19/19   [name], [name], MD  magnesium oxide (MAG-OX) 400 MG tablet Take 400 mg by mouth at bedtime.     [provider]  meloxicam (MOBIC) 15 MG tablet TAKE 1 TABLET (15 MG TOTAL) BY MOUTH DAILY. 03/20/19   [name], [name], MD  omeprazole (PRILOSEC) 40 MG capsule TAKE 1 CAPSULE TWICE DAILY 06/19/19   [name], [name], MD  PRESCRIPTION MEDICATION Oxygen 3.0 liters at night, prn use in day    [provider]  Respiratory Therapy Supplies (FLUTTER) DEVI Use three times daily 01/04/11   Elsie Stain, MD  sucralfate (CARAFATE) 1 GM/10ML  suspension Take 10 mLs (1 g total) by mouth 4 (four) times daily -  with meals and at bedtime. 08/02/19   Dorie Rank, MD  SUPER B COMPLEX/C PO Take 1 capsule by mouth at bedtime.    [provider]  traMADol (ULTRAM) 50 MG tablet Take 1 tablet (50 mg total) by mouth every 8 (eight) hours as needed. 09/12/18   [name], [name], MD    Allergies    Nuts, Other, Vicodin [hydrocodone-acetaminophen], Codeine, Aspirin, and Penicillins  Review of Systems  Review of Systems  Neurological:       Occasional numbness in his right leg.  This is not associated with this episode but has occurred in the past  All other systems reviewed and are negative.   Physical Exam Updated Vital Signs BP (!) 133/101   Pulse 88   Temp 97.7 F (36.5 C) (Oral)   Resp 20   Ht 1.753 m (5\' 9" )   Wt 93.4 kg   SpO2 94%   BMI 30.42 kg/m   Physical Exam Vitals and nursing note reviewed.  Constitutional:      Appearance: He is not diaphoretic.  HENT:     Head: Normocephalic and atraumatic.     Right Ear: External ear normal.     Left Ear: External ear normal.  Eyes:     General: No scleral icterus.       Right eye: No discharge.        Left eye: No discharge.     Conjunctiva/sclera: Conjunctivae normal.  Neck:     Trachea: No tracheal deviation.  Cardiovascular:     Rate and Rhythm: Normal rate and regular rhythm.     Heart sounds: Normal heart sounds.     Comments: Normal pulses bilateral upper and lower extremities, normal perfusion Pulmonary:     Effort: Pulmonary effort is normal. No respiratory distress.     Breath sounds: Normal breath sounds. No stridor. No wheezing or rales.  Abdominal:     General: Bowel sounds are normal. There is no distension.     Palpations: Abdomen is soft.     Tenderness: There is no abdominal tenderness. There is no guarding or rebound.  Musculoskeletal:        General: No tenderness.     Cervical back: Neck supple.  Skin:    General: Skin is warm and dry.      Findings: No rash.  Neurological:     Mental Status: He is alert.     Cranial Nerves: No cranial nerve deficit (no facial droop, extraocular movements intact, no slurred speech).     Sensory: No sensory deficit.     Motor: No abnormal muscle tone or seizure activity.     Coordination: Coordination normal.     ED Results / Procedures / Treatments   Labs (all labs ordered are listed, but only abnormal results are displayed) Labs Reviewed  BASIC METABOLIC PANEL - Abnormal; Notable for the following components:      Result Value   Glucose, Bld 127 (*)    Creatinine, Ser 1.66 (*)    GFR calc non Af Amer 40 (*)    GFR calc Af Amer 47 (*)    All other components within normal limits  CBC - Abnormal; Notable for the following components:   WBC 14.0 (*)    All other components within normal limits  TROPONIN I (HIGH SENSITIVITY)  TROPONIN I (HIGH SENSITIVITY)    EKG EKG Interpretation  Date/Time:  Sunday August 02 2019 10:31:45 EDT Ventricular Rate:  88 PR Interval:  144 QRS Duration: 80 QT Interval:  360 QTC Calculation: 435 R Axis:   26 Text Interpretation: Normal sinus rhythm ST & T wave abnormality, consider inferior ischemia Abnormal ECG No significant change since last tracing Confirmed by Dorie Rank [phone]) on 08/02/2019 11:09:12 AM   Radiology DG Chest 2 View  Result Date: 08/02/2019 CLINICAL DATA:  Chest pain EXAM: CHEST - 2 VIEW COMPARISON:  02/13/2018 FINDINGS: The heart size and mediastinal contours  are within normal limits. Unchanged scarring of the upper lungs. The visualized skeletal structures are unremarkable. IMPRESSION: No acute abnormality of the lungs. Electronically Signed   By: Eddie Candle M.D.   On: 08/02/2019 11:21   CT ANGIO CHEST AORTA W/CM & OR WO/CM  Result Date: 08/02/2019 CLINICAL DATA:  Chest pain radiating to back EXAM: CT ANGIOGRAPHY CHEST WITH CONTRAST TECHNIQUE: Initially, axial CT images were obtained through the chest without intravenous  contrast material administration. Multidetector CT imaging of the chest was performed using the standard protocol during bolus administration of intravenous contrast. Multiplanar CT image reconstructions and MIPs were obtained to evaluate the vascular anatomy. CONTRAST:  75mL OMNIPAQUE IOHEXOL 350 MG/ML SOLN COMPARISON:  Chest radiograph August 02, 2019; chest CT August 28, 2010 FINDINGS: Cardiovascular: There is no intramural hematoma in the thoracic aorta on noncontrast enhanced study. There is no evident mediastinal hematoma. There is no thoracic aortic aneurysm or dissection. Visualized great vessels appear unremarkable. Note that the right innominate and left common carotid arteries arise as a common trunk, an anatomic variant. There are foci of aortic atherosclerosis. There are foci of coronary artery calcification. There is no pericardial effusion or pericardial thickening. There is no demonstrable pulmonary embolus. Mediastinum/Nodes: Thyroid appears diminutive without focal lesion. No adenopathy evident. No appreciable esophageal lesions. Lungs/Pleura: There is extensive underlying centrilobular and paraseptal emphysematous change. There is scarring in each upper lobe. There is apparent rounded atelectasis in the left upper lobe anteriorly with the rounded atelectasis aspect best appreciated on coronal and sagittal sequences. There are scattered areas of atelectatic change in scarring elsewhere. No edema or airspace opacity. No pleural effusions evident. There is a stable 2 mm nodular opacity abutting the pleura in the right upper lobe seen on axial slice 40 series 8. Upper Abdomen: There is a cyst arising from the upper pole of the right kidney measuring 5.7 x 4.3 cm. Visualized upper abdominal structures otherwise appear unremarkable. Musculoskeletal: No blastic or lytic bone lesions. There are foci of degenerative change in the thoracic spine. No chest wall lesions. Review of the MIP images confirms the  above findings. IMPRESSION: 1. No thoracic aortic aneurysm or dissection. There are foci of aortic atherosclerosis and coronary artery calcification. 2.  No evident pulmonary embolus. 3. Extensive underlying emphysematous change. Areas of scarring and atelectatic change with focal rounded atelectasis in the left upper lobe. No edema or airspace opacity. No pleural effusion. 4.  No evident adenopathy. Aortic Atherosclerosis (ICD10-I70.0) and Emphysema (ICD10-J43.9). Electronically Signed   By: Lowella Grip III M.D.   On: 08/02/2019 13:46    Procedures Procedures (including critical care time)  Medications Ordered in ED Medications  ondansetron (ZOFRAN) injection 4 mg (4 mg Intravenous Given 08/02/19 1131)  pantoprazole (PROTONIX) injection 40 mg (40 mg Intravenous Given 08/02/19 1150)  iohexol (OMNIPAQUE) 350 MG/ML injection 80 mL (80 mLs Intravenous Contrast Given 08/02/19 1329)    ED Course  I have reviewed the triage vital signs and the nursing notes.  Pertinent labs & imaging results that were available during my care of the patient were reviewed by me and considered in my medical decision making (see chart for details).  Clinical Course as of Aug 02 1531  Sun Aug 02, 2019  1240 CXR negative.   [id number] Labs reviewed.  Creatinine elevated but stable.  Leukocytosis noted   [JK]  1500 CT scan does not show evidence of dissection or embolism   [JK]    Clinical Course User  Index [JK] Dorie Rank, MD   MDM Rules/Calculators/A&P HEAR Score: 4                    Will proceed with ACS workup.  ACS, esophageal etiology, aortic dissection a concern.  Hold off on asa  Patient CT scans did not show any evidence of dissection or embolism.  Patient's laboratory tests were normal including his serial troponins.  At this point I doubt acute coronary syndrome.  I doubt PE or aortic dissection.  Patient symptoms did start after he tried to swallow something.  He does have history of dysphagia  and is required esophageal dilatation in the past.  I suspect his symptoms are related to esophagitis.   I will add Carafate to his antacids.  Recommend he follow-up with his GI doctor.  Avoid acid-containing foods and try a soft diet over the next several days. Final Clinical Impression(s) / ED Diagnoses Final diagnoses:  Chest pain, unspecified type  Dysphagia, unspecified type    Rx / DC Orders ED Discharge Orders         Ordered    sucralfate (CARAFATE) 1 GM/10ML suspension  3 times daily with meals & bedtime     08/02/19 1532           Dorie Rank, MD 08/02/19 1534

## 2019-08-05 ENCOUNTER — Other Ambulatory Visit: Payer: Self-pay | Admitting: Family Medicine

## 2019-08-06 ENCOUNTER — Encounter: Payer: Self-pay | Admitting: Gastroenterology

## 2019-08-06 ENCOUNTER — Ambulatory Visit: Payer: Medicare HMO | Admitting: Gastroenterology

## 2019-08-06 VITALS — BP 118/74 | HR 73 | Temp 97.9°F | Ht 67.0 in | Wt 206.0 lb

## 2019-08-06 DIAGNOSIS — K222 Esophageal obstruction: Secondary | ICD-10-CM | POA: Diagnosis not present

## 2019-08-06 DIAGNOSIS — K219 Gastro-esophageal reflux disease without esophagitis: Secondary | ICD-10-CM

## 2019-08-06 DIAGNOSIS — R1310 Dysphagia, unspecified: Secondary | ICD-10-CM

## 2019-08-06 DIAGNOSIS — K920 Hematemesis: Secondary | ICD-10-CM

## 2019-08-06 MED ORDER — SUCRALFATE 1 GM/10ML PO SUSP: 1.0000 g | Freq: Three times a day (TID) | ORAL | 0 refills | Status: DC

## 2019-08-06 NOTE — Patient Instructions (Addendum)
If you are age 74 or older, your body mass index should be between 23-30. Your Body mass index is 32.26 kg/m. If this is out of the aforementioned range listed, please consider follow up with your Primary Care Provider.  If you are age 55 or younger, your body mass index should be between 19-25. Your Body mass index is 32.26 kg/m. If this is out of the aformentioned range listed, please consider follow up with your Primary Care Provider.   You have been scheduled for a colonoscopy. Please follow written instructions given to you at your visit today.  Please pick up your prep supplies at the pharmacy within the next 1-3 days. If you use inhalers (even only as needed), please bring them with you on the day of your procedure.  Continue Carafate

## 2019-08-06 NOTE — Addendum Note (Signed)
Addended by: Aleatha Borer on: 08/06/2019 11:36 AM   Modules accepted: Orders

## 2019-08-06 NOTE — Progress Notes (Signed)
08/06/2019 DUY STRAWTHER [id number] [birth year]   HISTORY OF PRESENT ILLNESS:  This is a pleasant 74 year old male who is known to Dr. Henrene Pastor.  Has history of dysphagia due to esophageal stenosis/stricture and GERD.  Has been on omeprazole 40 mg BID for a long time.  EGD in 02/2016.  Benign appearing esophageal stenosis dilated with [address] [place] [address].  Here again today with similar complaints.  Tells me that he has been experiencing issues with swallowing again for about the past 3 months or so.  Mostly bread, meats, and rice bother him.  Then on Sunday, Easter, he woke up with sudden onset of severe pain in the center of his chest.  He vomited and vomited a "right much" amount of blood.  Says that it felt like an ice pick through his chest and to his back.  Went to the ED where chest CT, EKG, troponins all ok/stable.  CBC showed a mildly elevated WBC count at 14, but otherwise labs ok/stable.  They gave him carafate suspension and since he has been taking that he has been feeling better.  Still with dysphagia obviously though.  Uses O2 at night and prn during the day.   Past Medical History:  Diagnosis Date  . Allergic rhinitis   . Arthritis   . BOOP (bronchiolitis obliterans with organizing pneumonia) ([place])   . Bronchitis   . Colon polyps   . COPD (chronic obstructive pulmonary disease) ([place])   . Dyspnea    with exertion   . Dysrhythmia    skipped beats- followed by Dr Angelena Form   . GERD (gastroesophageal reflux disease)   . Hypertension   . Hypothyroidism   . Kidney stone   . Low back pain   . Oxygen dependent    uses 3L/[place] at nite occasional during day if needed   . Stroke [facility])    hx of 2 strokes which patient did not know he had had   . Thyroid disease   . Tobacco dependence    Past Surgical History:  Procedure Laterality Date  . broncoscopy     . carpel tunnal     left  . COLONOSCOPY     x 4  . DUPUYTREN CONTRACTURE RELEASE     Left  .  ESOPHAGOGASTRODUODENOSCOPY (EGD) WITH PROPOFOL N/A 03/26/2016   Procedure: ESOPHAGOGASTRODUODENOSCOPY (EGD) WITH PROPOFOL;  Surgeon: Irene Shipper, MD;  Location: [facility] ENDOSCOPY;  Service: Endoscopy;  Laterality: N/A;  . ESOPHAGOGASTRODUODENOSCOPY ENDOSCOPY     x 3  . FACIAL FRACTURE SURGERY    . OTHER SURGICAL HISTORY     facial surgery  . TOTAL HIP ARTHROPLASTY  01/2011   Right hip, [place]  . ULNAR NERVE TRANSPOSITION     left    reports that he quit smoking about 9 years ago. His smoking use included cigarettes. He has a 50.00 pack-year smoking history. His smokeless tobacco use includes snuff. He reports current alcohol use. He reports that he does not use drugs. family history includes Arthritis in his father; Cancer in his mother and paternal grandmother; Colon cancer in an other family member; Healthy in his daughter and son; Prostate cancer in his maternal grandfather; Stomach cancer in his maternal grandmother; Stroke (age of onset: 63) in his father. Allergies  Allergen Reactions  . Nuts Nausea And Vomiting    sweating  . Other Nausea And Vomiting    Per patient, cannot take any narcotic. Nausea and vomiting sweating  . Vicodin [  Hydrocodone-Acetaminophen]     tachycardia  . Codeine   . Aspirin Other (See Comments)    Upset stomach, bleeding  . Penicillins Hives and Rash    Has patient had a PCN reaction causing immediate rash, facial/tongue/throat swelling, SOB or lightheadedness with hypotension:No Has patient had a PCN reaction causing severe rash involving mucus membranes or skin necrosis:No Has patient had a PCN reaction that required hospitalization:No Has patient had a PCN reaction occurring within the last 10 years:Yes If all of the above answers are "NO", then may proceed with Cephalosporin use.       Outpatient Encounter Medications as of 08/06/2019  Medication Sig  . acetaminophen (TYLENOL) 500 MG tablet Take 1,000 mg by mouth 2 (two) times daily as needed (for  pain.).  Marland Kitchen albuterol (VENTOLIN HFA) 108 (90 Base) MCG/ACT inhaler INHALE TWO PUFFS INTO LUNGS EVERY 6 HOURS AS NEEDED FOR WHEEZING  . budesonide-formoterol (SYMBICORT) 160-4.5 MCG/ACT inhaler Inhale 2 puffs into the lungs 2 (two) times daily.  . cetirizine (ZYRTEC) 10 MG tablet Take 10 mg by mouth at bedtime.   . fluticasone (FLONASE) 50 MCG/ACT nasal spray Place 1 spray into both nostrils 2 (two) times daily as needed for allergies or rhinitis.  . Homeopathic Products (LEG CRAMP RELIEF PO) Take 2 tablets by mouth at bedtime. [employer]yland's  . levothyroxine (SYNTHROID) 125 MCG tablet Take 1 tablet (125 mcg total) by mouth daily before breakfast.  . losartan-hydrochlorothiazide (HYZAAR) 50-12.5 MG tablet TAKE 1 TABLET EVERY DAY  . magnesium oxide (MAG-OX) 400 MG tablet Take 400 mg by mouth at bedtime.   . meloxicam (MOBIC) 15 MG tablet TAKE 1 TABLET (15 MG TOTAL) BY MOUTH DAILY.  Marland Kitchen omeprazole (PRILOSEC) 40 MG capsule TAKE 1 CAPSULE TWICE DAILY  . PRESCRIPTION MEDICATION Oxygen 3.0 liters at night, prn use in day  . Respiratory Therapy Supplies (FLUTTER) DEVI Use three times daily  . sucralfate (CARAFATE) 1 GM/10ML suspension Take 10 mLs (1 g total) by mouth 4 (four) times daily -  with meals and at bedtime.  . SUPER B COMPLEX/C PO Take 1 capsule by mouth at bedtime.  . traMADol (ULTRAM) 50 MG tablet Take 1 tablet (50 mg total) by mouth every 8 (eight) hours as needed.   No facility-administered encounter medications on file as of 08/06/2019.     REVIEW OF SYSTEMS  : All other systems reviewed and negative except where noted in the History of Present Illness.   PHYSICAL EXAM: BP 118/74   Pulse 73   Temp 97.9 F (36.6 C)   Ht 5\' 7"  (1.702 m)   Wt 206 lb (93.4 kg)   BMI 32.26 kg/m  General: Well developed white male in no acute distress Head: Normocephalic and atraumatic Eyes:  Sclerae anicteric, conjunctiva pink. Ears: Normal auditory acuity Lungs: Clear throughout to auscultation; no  increased WOB. Heart: Regular rate and rhythm; no M/R/G. Abdomen: Soft, non-distended.  BS present.  Non-tender. Musculoskeletal: Symmetrical with no gross deformities  Skin: No lesions on visible extremities Extremities: No edema  Neurological: Alert oriented x 4, grossly non-focal Psychological:  Alert and cooperative. Normal mood and affect  ASSESSMENT AND PLAN: *Dysphagia, history of esophageal stricture/stenosis, non-cardiac CP, hematemesis, GERD:  Had EGD with dilation in 2017.  On omeprazole 40 mg BID ongoing.  Dysphagia to solid food again for about 3 months.  Acute onset of other symptoms prompted ED visit.  Carafate has helped those symptoms.  Will plan for EGD with dilation with Dr. Henrene Pastor at  [facility] due to O2 use.  The risks, benefits, and alternatives to EGD with dilation were discussed with the patient and he consents to proceed. Will continue carafate suspension ACHS for now.  Prescription sent to pharmacy.   CC:  [name], [name], MD

## 2019-08-06 NOTE — Progress Notes (Signed)
Noted  

## 2019-08-06 NOTE — H&P (View-Only) (Signed)
Noted  

## 2019-08-07 ENCOUNTER — Other Ambulatory Visit: Payer: Self-pay | Admitting: Family Medicine

## 2019-08-07 DIAGNOSIS — E039 Hypothyroidism, unspecified: Secondary | ICD-10-CM

## 2019-08-07 DIAGNOSIS — J441 Chronic obstructive pulmonary disease with (acute) exacerbation: Secondary | ICD-10-CM

## 2019-08-13 ENCOUNTER — Telehealth: Payer: Self-pay | Admitting: Family Medicine

## 2019-08-13 ENCOUNTER — Other Ambulatory Visit (HOSPITAL_COMMUNITY): Payer: Medicare HMO

## 2019-08-13 NOTE — Telephone Encounter (Signed)
Pt has not been seen since 09/12/18. appt set for 09/09/19

## 2019-08-14 ENCOUNTER — Other Ambulatory Visit (HOSPITAL_COMMUNITY)
Admission: RE | Admit: 2019-08-14 | Discharge: 2019-08-14 | Disposition: A | Discharge status: Home / Self Care | Payer: Medicare HMO | Source: Ambulatory Visit | Attending: Internal Medicine | Admitting: Internal Medicine

## 2019-08-14 DIAGNOSIS — Z20822 Contact with and (suspected) exposure to covid-19: Secondary | ICD-10-CM | POA: Insufficient documentation

## 2019-08-14 DIAGNOSIS — Z01812 Encounter for preprocedural laboratory examination: Secondary | ICD-10-CM | POA: Insufficient documentation

## 2019-08-14 LAB — SARS CORONAVIRUS 2 (TAT 6-24 HRS): SARS Coronavirus 2: NEGATIVE

## 2019-08-17 ENCOUNTER — Encounter (HOSPITAL_COMMUNITY): Payer: Self-pay | Admitting: Internal Medicine

## 2019-08-17 ENCOUNTER — Telehealth: Payer: Self-pay | Admitting: Family Medicine

## 2019-08-17 DIAGNOSIS — J441 Chronic obstructive pulmonary disease with (acute) exacerbation: Secondary | ICD-10-CM

## 2019-08-17 MED ORDER — BUDESONIDE-FORMOTEROL FUMARATE 160-4.5 MCG/ACT IN AERO: 2.0000 | INHALATION_SPRAY | Freq: Two times a day (BID) | RESPIRATORY_TRACT | 0 refills | Status: DC

## 2019-08-17 NOTE — Telephone Encounter (Signed)
One month supply sent for patient per patient request- patient aware.

## 2019-08-17 NOTE — Anesthesia Preprocedure Evaluation (Addendum)
Anesthesia Evaluation  Patient identified by MRN, date of birth, ID band Patient awake    Reviewed: Allergy & Precautions, NPO status , Patient's Chart, lab work & pertinent test results  History of Anesthesia Complications Negative for: history of anesthetic complications  Airway Mallampati: I  TM Distance: >3 FB Neck ROM: Full    Dental no notable dental hx. (+) Dental Advisory Given, Edentulous Upper, Edentulous Lower   Pulmonary shortness of breath and with exertion, pneumonia, resolved, COPD,  COPD inhaler and oxygen dependent, former smoker,    Pulmonary exam normal        Cardiovascular hypertension, Pt. on medications and Pt. on home beta blockers Normal cardiovascular exam+ dysrhythmias Atrial Fibrillation      Neuro/Psych CVA, No Residual Symptoms negative psych ROS   GI/Hepatic Neg liver ROS, GERD  Medicated,Dyshagia Hematemesis Esophageal stricture    Endo/Other  Hypothyroidism   Renal/GU Renal diseasenegative Renal ROS     Musculoskeletal   Abdominal   Peds  Hematology   Anesthesia Other Findings   Reproductive/Obstetrics                             Anesthesia Physical  Anesthesia Plan  ASA: III  Anesthesia Plan: MAC   Post-op Pain Management:    Induction: Intravenous  PONV Risk Score and Plan: Ondansetron, Propofol infusion and Treatment may vary due to age or medical condition  Airway Management Planned: Natural Airway and Nasal Cannula  Additional Equipment:   Intra-op Plan:   Post-operative Plan:   Informed Consent: I have reviewed the patients History and Physical, chart, labs and discussed the procedure including the risks, benefits and alternatives for the proposed anesthesia with the patient or authorized representative who has indicated his/her understanding and acceptance.     Dental advisory given  Plan Discussed with: CRNA and  Anesthesiologist  Anesthesia Plan Comments:        Anesthesia Quick Evaluation

## 2019-08-17 NOTE — Telephone Encounter (Signed)
  Prescription Request  08/17/2019  What is the name of the medication or equipment?  budesonide-formoterol (SYMBICORT) 160-4.5 MCG/ACT inhaler  Have you contacted your pharmacy to request a refill? (if applicable) pharmacy called  Which [facility] would you like this sent to? [facility]   Patient notified that their request is being sent to the clinical staff for review and that they should receive a response within 2 business days.

## 2019-08-17 NOTE — Telephone Encounter (Signed)
Patient has appoint scheduled.

## 2019-08-18 ENCOUNTER — Ambulatory Visit (HOSPITAL_COMMUNITY): Payer: Medicare HMO | Admitting: Anesthesiology

## 2019-08-18 ENCOUNTER — Encounter (HOSPITAL_COMMUNITY): Payer: Self-pay | Admitting: Internal Medicine

## 2019-08-18 ENCOUNTER — Encounter (HOSPITAL_COMMUNITY)
Admission: RE | Disposition: A | Discharge status: Home / Self Care | Payer: Self-pay | Source: Home / Self Care | Attending: Internal Medicine

## 2019-08-18 ENCOUNTER — Ambulatory Visit (HOSPITAL_COMMUNITY)
Admission: RE | Admit: 2019-08-18 | Discharge: 2019-08-18 | Disposition: A | Discharge status: Home / Self Care | Payer: Medicare HMO | Attending: Internal Medicine | Admitting: Internal Medicine

## 2019-08-18 ENCOUNTER — Other Ambulatory Visit: Payer: Self-pay

## 2019-08-18 DIAGNOSIS — K209 Esophagitis, unspecified without bleeding: Secondary | ICD-10-CM | POA: Insufficient documentation

## 2019-08-18 DIAGNOSIS — Z800 Family history of malignant neoplasm of digestive organs: Secondary | ICD-10-CM | POA: Insufficient documentation

## 2019-08-18 DIAGNOSIS — R1310 Dysphagia, unspecified: Secondary | ICD-10-CM | POA: Diagnosis not present

## 2019-08-18 DIAGNOSIS — Z79899 Other long term (current) drug therapy: Secondary | ICD-10-CM | POA: Insufficient documentation

## 2019-08-18 DIAGNOSIS — Z8673 Personal history of transient ischemic attack (TIA), and cerebral infarction without residual deficits: Secondary | ICD-10-CM | POA: Insufficient documentation

## 2019-08-18 DIAGNOSIS — K219 Gastro-esophageal reflux disease without esophagitis: Secondary | ICD-10-CM | POA: Insufficient documentation

## 2019-08-18 DIAGNOSIS — I10 Essential (primary) hypertension: Secondary | ICD-10-CM | POA: Diagnosis not present

## 2019-08-18 DIAGNOSIS — E039 Hypothyroidism, unspecified: Secondary | ICD-10-CM | POA: Diagnosis not present

## 2019-08-18 DIAGNOSIS — E785 Hyperlipidemia, unspecified: Secondary | ICD-10-CM | POA: Diagnosis not present

## 2019-08-18 DIAGNOSIS — J449 Chronic obstructive pulmonary disease, unspecified: Secondary | ICD-10-CM | POA: Diagnosis not present

## 2019-08-18 DIAGNOSIS — K228 Other specified diseases of esophagus: Secondary | ICD-10-CM | POA: Diagnosis not present

## 2019-08-18 DIAGNOSIS — Z9981 Dependence on supplemental oxygen: Secondary | ICD-10-CM | POA: Insufficient documentation

## 2019-08-18 DIAGNOSIS — Z87891 Personal history of nicotine dependence: Secondary | ICD-10-CM | POA: Diagnosis not present

## 2019-08-18 DIAGNOSIS — K449 Diaphragmatic hernia without obstruction or gangrene: Secondary | ICD-10-CM | POA: Insufficient documentation

## 2019-08-18 DIAGNOSIS — K222 Esophageal obstruction: Secondary | ICD-10-CM | POA: Diagnosis not present

## 2019-08-18 DIAGNOSIS — Z96641 Presence of right artificial hip joint: Secondary | ICD-10-CM | POA: Diagnosis not present

## 2019-08-18 DIAGNOSIS — K920 Hematemesis: Secondary | ICD-10-CM

## 2019-08-18 DIAGNOSIS — Z7951 Long term (current) use of inhaled steroids: Secondary | ICD-10-CM | POA: Insufficient documentation

## 2019-08-18 DIAGNOSIS — Z79890 Hormone replacement therapy (postmenopausal): Secondary | ICD-10-CM | POA: Insufficient documentation

## 2019-08-18 DIAGNOSIS — J309 Allergic rhinitis, unspecified: Secondary | ICD-10-CM | POA: Insufficient documentation

## 2019-08-18 DIAGNOSIS — I129 Hypertensive chronic kidney disease with stage 1 through stage 4 chronic kidney disease, or unspecified chronic kidney disease: Secondary | ICD-10-CM | POA: Diagnosis not present

## 2019-08-18 DIAGNOSIS — N183 Chronic kidney disease, stage 3 unspecified: Secondary | ICD-10-CM | POA: Diagnosis not present

## 2019-08-18 HISTORY — PX: SAVORY DILATION: SHX5439

## 2019-08-18 HISTORY — PX: BIOPSY: SHX5522

## 2019-08-18 HISTORY — PX: ESOPHAGOGASTRODUODENOSCOPY (EGD) WITH PROPOFOL: SHX5813

## 2019-08-18 SURGERY — ESOPHAGOGASTRODUODENOSCOPY (EGD) WITH PROPOFOL
Anesthesia: Monitor Anesthesia Care

## 2019-08-18 MED ORDER — PROPOFOL 500 MG/50ML IV EMUL
INTRAVENOUS | Status: AC
  Filled 2019-08-18: qty 50

## 2019-08-18 MED ORDER — SODIUM CHLORIDE 0.9 % IV SOLN: INTRAVENOUS | Status: DC

## 2019-08-18 MED ORDER — LIDOCAINE 2% (20 MG/ML) 5 ML SYRINGE
INTRAMUSCULAR | Status: DC | PRN
  Administered 2019-08-18: 80 mg via INTRAVENOUS

## 2019-08-18 MED ORDER — PROPOFOL 10 MG/ML IV BOLUS
INTRAVENOUS | Status: DC | PRN
  Administered 2019-08-18 (×2): 20 mg via INTRAVENOUS

## 2019-08-18 MED ORDER — LACTATED RINGERS IV SOLN: INTRAVENOUS | Status: DC

## 2019-08-18 MED ORDER — PROPOFOL 500 MG/50ML IV EMUL
INTRAVENOUS | Status: DC | PRN
  Administered 2019-08-18: 125 ug/kg/min via INTRAVENOUS

## 2019-08-18 SURGICAL SUPPLY — 14 items

## 2019-08-18 NOTE — Anesthesia Postprocedure Evaluation (Signed)
Anesthesia Post Note  Patient: Antonio Bowen  Procedure(s) Performed: ESOPHAGOGASTRODUODENOSCOPY (EGD) WITH PROPOFOL (N/A ) SAVORY DILATION (N/A ) BIOPSY     Patient location during evaluation: PACU Anesthesia Type: MAC Level of consciousness: awake and alert and oriented Pain management: pain level controlled Vital Signs Assessment: post-procedure vital signs reviewed and stable Respiratory status: spontaneous breathing, nonlabored ventilation and respiratory function stable Cardiovascular status: stable and blood pressure returned to baseline Postop Assessment: no apparent nausea or vomiting Anesthetic complications: no    Last Vitals:  Vitals:   08/18/19 0721 08/18/19 0925  BP: 125/72 118/71  Pulse: 70 62  Resp: 18 14  Temp: 36.8 C 36.6 C  SpO2: 96% 97%    Last Pain:  Vitals:   08/18/19 0925  TempSrc: Oral  PainSc:                  Koreen Lizaola A.

## 2019-08-18 NOTE — Interval H&P Note (Signed)
History and Physical Interval Note:  08/18/2019 8:53 AM  Antonio Bowen  has presented today for surgery, with the diagnosis of dysphagia, esophageal stricture, hematemesis, GERD.  The various methods of treatment have been discussed with the patient and family. After consideration of risks, benefits and other options for treatment, the patient has consented to  Procedure(s) with comments: ESOPHAGOGASTRODUODENOSCOPY (EGD) WITH PROPOFOL (N/A) - Possible dilation SAVORY DILATION (N/A) as a surgical intervention.  The patient's history has been reviewed, patient examined, no change in status, stable for surgery.  I have reviewed the patient's chart and labs.  Questions were answered to the patient's satisfaction.     Antonio Bowen

## 2019-08-18 NOTE — Transfer of Care (Signed)
Immediate Anesthesia Transfer of Care Note  Patient: Antonio Bowen  Procedure(s) Performed: ESOPHAGOGASTRODUODENOSCOPY (EGD) WITH PROPOFOL (N/A ) SAVORY DILATION (N/A ) BIOPSY  Patient Location: PACU  Anesthesia Type:MAC  Level of Consciousness: awake, alert  and oriented  Airway & Oxygen Therapy: Patient Spontanous Breathing and Patient connected to nasal cannula oxygen  Post-op Assessment: Report given to RN and Post -op Vital signs reviewed and stable  Post vital signs: Reviewed and stable  Last Vitals:  Vitals Value Taken Time  BP    Temp    Pulse 63 08/18/19 0921  Resp 16 08/18/19 0921  SpO2 97 % 08/18/19 0921  Vitals shown include unvalidated device data.  Last Pain:  Vitals:   08/18/19 0721  TempSrc: Oral  PainSc: 2          Complications: No apparent anesthesia complications

## 2019-08-18 NOTE — Op Note (Signed)
[facility] Patient Name: Antonio Bowen Procedure Date: 08/18/2019 MRN: [id number] Attending MD: Docia Chuck. Henrene Pastor , MD Date of Birth: 1945-08-10 CSN: [id number] Age: 74 Admit Type: Outpatient Procedure:                Upper GI endoscopy with biopsies; Maloney dilation.                            [address] French Indications:              Dysphagia Providers:                Docia Chuck. Henrene Pastor, MD, Benetta Spar RN, RN, Laverda Sorenson, Technician, [facility], CRNA Referring MD:              Medicines:                Monitored Anesthesia Care Complications:            No immediate complications. Estimated Blood Loss:     Estimated blood loss: none. Procedure:                Pre-Anesthesia Assessment:                           - Prior to the procedure, a History and Physical                            was performed, and patient medications and                            allergies were reviewed. The patient's tolerance of                            previous anesthesia was also reviewed. The risks                            and benefits of the procedure and the sedation                            options and risks were discussed with the patient.                            All questions were answered, and informed consent                            was obtained. Prior Anticoagulants: The patient has                            taken no previous anticoagulant or antiplatelet                            agents. ASA Grade Assessment: III - A patient with  severe systemic disease. After reviewing the risks                            and benefits, the patient was deemed in                            satisfactory condition to undergo the procedure.                           After obtaining informed consent, the endoscope was                            passed under direct vision. Throughout the                            procedure, the  patient's blood pressure, pulse, and                            oxygen saturations were monitored continuously. The                            [id number] Olympus gastroscope was                            introduced through the mouth, and advanced to the                            second part of duodenum. The upper GI endoscopy was                            accomplished without difficulty. The patient                            tolerated the procedure well. Scope In: Scope Out: Findings:      One benign-appearing, intrinsic moderate stenosis was found 38 cm from       the incisors. This stenosis measured 1.5 cm (inner diameter). There was       esophagitis in this region as manifested by quite friable mucosa. No       obvious mass or ulceration. Multiple biopsies were taken of the inflamed       area. After completing the endoscopic survey, the scope was withdrawn.       Dilation was performed with a Maloney dilator with mild resistance at 5       Fr. The dilation site was examined following endoscope reinsertion and       showed moderate mucosal disruption at the region of the stricture as       well as a contained linear proximal esophageal mucosal wrent.      The exam of the esophagus was otherwise normal.      The stomach was normal save a small hiatal hernia.      The examined duodenum was normal.      The cardia and gastric fundus were normal on retroflexion. Impression:               - Benign-appearing esophageal stenosis. Dilated.  Esophagitis was biopsied.                           - Normal stomach. Small hiatal hernia                           - Normal examined duodenum. Moderate Sedation:      none Recommendation:           1. Patient has a contact number available for                            emergencies. The signs and symptoms of potential                            delayed complications were discussed with the                             patient. Return to normal activities tomorrow.                            Written discharge instructions were provided to the                            patient.                           2. N.p.o. for 2 hours then clear liquids for 2                            hours. Soft foods the remainder of today. Resume                            previous diet tomorrow.                           3. Continue current medications including                            omeprazole 40 mg TWICE daily                           4. Office follow-up with Dr. Henrene Pastor or one of his                            advanced practitioners in 6 to 8 weeks. Procedure Code(s):        --- Professional ---                           [phone], Esophagogastroduodenoscopy, flexible,                            transoral; with biopsy, single or multiple                           43450, Dilation of esophagus, by unguided sound or  bougie, single or multiple passes Diagnosis Code(s):        --- Professional ---                           K22.2, Esophageal obstruction                           R13.10, Dysphagia, unspecified CPT copyright 2019 American [employer] Association. All rights reserved. The codes documented in this report are preliminary and upon coder review may  be revised to meet current compliance requirements. Docia Chuck. Henrene Pastor, MD 08/18/2019 9:25:03 AM This report has been signed electronically. Number of Addenda: 0

## 2019-08-18 NOTE — Discharge Instructions (Signed)
YOU HAD AN ENDOSCOPIC PROCEDURE TODAY: Refer to the procedure report and other information in the discharge instructions given to you for any specific questions about what was found during the examination. If this information does not answer your questions, please call [place] office at [phone] to clarify.   YOU SHOULD EXPECT: Some feelings of bloating in the abdomen. Passage of more gas than usual. Walking can help get rid of the air that was put into your GI tract during the procedure and reduce the bloating. If you had a lower endoscopy (such as a colonoscopy or flexible sigmoidoscopy) you may notice spotting of blood in your stool or on the toilet paper. Some abdominal soreness may be present for a day or two, also.  DIET: Nothing to eat or drink for 2 hours , then clear liquids for 2 hours, soft foods todayYour first meal following the procedure should be a light meal and then it is ok to progress to your normal diet. A half-sandwich or bowl of soup is an example of a good first meal. Heavy or fried foods are harder to digest and may make you feel nauseous or bloated. Drink plenty of fluids but you should avoid alcoholic beverages for 24 hours. If you had a esophageal dilation, please see attached instructions for diet.    ACTIVITY: Your care partner should take you home directly after the procedure. You should plan to take it easy, moving slowly for the rest of the day. You can resume normal activity the day after the procedure however YOU SHOULD NOT DRIVE, use power tools, machinery or perform tasks that involve climbing or major physical exertion for 24 hours (because of the sedation medicines used during the test).   SYMPTOMS TO REPORT IMMEDIATELY: A gastroenterologist can be reached at any hour. Please call [phone]  for any of the following symptoms:  Following lower endoscopy (colonoscopy, flexible sigmoidoscopy) Excessive amounts of blood in the stool  Significant tenderness,  worsening of abdominal pains  Swelling of the abdomen that is new, acute  Fever of 100 or higher  Following upper endoscopy (EGD, EUS, ERCP, esophageal dilation) Vomiting of blood or coffee ground material  New, significant abdominal pain  New, significant chest pain or pain under the shoulder blades  Painful or persistently difficult swallowing  New shortness of breath  Black, tarry-looking or red, bloody stools  FOLLOW UP:  If any biopsies were taken you will be contacted by phone or by letter within the next 1-3 weeks. Call [phone]  if you have not heard about the biopsies in 3 weeks.  Please also call with any specific questions about appointments or follow up tests.

## 2019-08-19 ENCOUNTER — Encounter: Payer: Self-pay | Admitting: *Deleted

## 2019-08-19 LAB — SURGICAL PATHOLOGY

## 2019-08-21 DIAGNOSIS — M5136 Other intervertebral disc degeneration, lumbar region: Secondary | ICD-10-CM | POA: Diagnosis not present

## 2019-08-21 DIAGNOSIS — M51369 Other intervertebral disc degeneration, lumbar region without mention of lumbar back pain or lower extremity pain: Secondary | ICD-10-CM | POA: Insufficient documentation

## 2019-08-21 DIAGNOSIS — M4316 Spondylolisthesis, lumbar region: Secondary | ICD-10-CM | POA: Diagnosis not present

## 2019-08-26 ENCOUNTER — Other Ambulatory Visit: Payer: Self-pay | Admitting: Family Medicine

## 2019-08-29 ENCOUNTER — Other Ambulatory Visit: Payer: Self-pay | Admitting: Gastroenterology

## 2019-08-30 DIAGNOSIS — J449 Chronic obstructive pulmonary disease, unspecified: Secondary | ICD-10-CM | POA: Diagnosis not present

## 2019-09-02 ENCOUNTER — Ambulatory Visit: Payer: Medicare HMO | Admitting: Internal Medicine

## 2019-09-09 ENCOUNTER — Ambulatory Visit (INDEPENDENT_AMBULATORY_CARE_PROVIDER_SITE_OTHER): Payer: Medicare HMO | Admitting: Family Medicine

## 2019-09-09 ENCOUNTER — Encounter: Payer: Self-pay | Admitting: Family Medicine

## 2019-09-09 ENCOUNTER — Other Ambulatory Visit: Payer: Self-pay

## 2019-09-09 VITALS — BP 135/83 | HR 106 | Temp 100.1°F | Ht 69.0 in | Wt 203.0 lb

## 2019-09-09 DIAGNOSIS — N1832 Chronic kidney disease, stage 3b: Secondary | ICD-10-CM | POA: Diagnosis not present

## 2019-09-09 DIAGNOSIS — I10 Essential (primary) hypertension: Secondary | ICD-10-CM

## 2019-09-09 DIAGNOSIS — E785 Hyperlipidemia, unspecified: Secondary | ICD-10-CM

## 2019-09-09 DIAGNOSIS — M5441 Lumbago with sciatica, right side: Secondary | ICD-10-CM | POA: Diagnosis not present

## 2019-09-09 DIAGNOSIS — G8929 Other chronic pain: Secondary | ICD-10-CM | POA: Diagnosis not present

## 2019-09-09 DIAGNOSIS — E039 Hypothyroidism, unspecified: Secondary | ICD-10-CM | POA: Diagnosis not present

## 2019-09-09 DIAGNOSIS — J441 Chronic obstructive pulmonary disease with (acute) exacerbation: Secondary | ICD-10-CM | POA: Diagnosis not present

## 2019-09-09 DIAGNOSIS — K219 Gastro-esophageal reflux disease without esophagitis: Secondary | ICD-10-CM

## 2019-09-09 DIAGNOSIS — M1712 Unilateral primary osteoarthritis, left knee: Secondary | ICD-10-CM | POA: Diagnosis not present

## 2019-09-09 MED ORDER — LEVOTHYROXINE SODIUM 125 MCG PO TABS: 125.0000 ug | ORAL_TABLET | Freq: Every day | ORAL | 3 refills | Status: DC

## 2019-09-09 MED ORDER — PREDNISONE 20 MG PO TABS: ORAL_TABLET | ORAL | 0 refills | Status: DC

## 2019-09-09 MED ORDER — BUDESONIDE-FORMOTEROL FUMARATE 160-4.5 MCG/ACT IN AERO: 2.0000 | INHALATION_SPRAY | Freq: Two times a day (BID) | RESPIRATORY_TRACT | 3 refills | Status: DC

## 2019-09-09 MED ORDER — OMEPRAZOLE 40 MG PO CPDR: 40.0000 mg | DELAYED_RELEASE_CAPSULE | Freq: Two times a day (BID) | ORAL | 3 refills | Status: DC

## 2019-09-09 MED ORDER — TRAMADOL HCL 50 MG PO TABS: 50.0000 mg | ORAL_TABLET | Freq: Three times a day (TID) | ORAL | 1 refills | Status: DC | PRN

## 2019-09-09 MED ORDER — LOSARTAN POTASSIUM-HCTZ 50-12.5 MG PO TABS: 1.0000 | ORAL_TABLET | Freq: Every day | ORAL | 3 refills | Status: DC

## 2019-09-09 MED ORDER — ALBUTEROL SULFATE HFA 108 (90 BASE) MCG/ACT IN AERS: 1.0000 | INHALATION_SPRAY | RESPIRATORY_TRACT | 3 refills | Status: DC | PRN

## 2019-09-09 MED ORDER — MELOXICAM 15 MG PO TABS: 15.0000 mg | ORAL_TABLET | Freq: Every day | ORAL | 3 refills | Status: DC

## 2019-09-09 MED ORDER — FLUTICASONE PROPIONATE 50 MCG/ACT NA SUSP: 1.0000 | Freq: Two times a day (BID) | NASAL | 3 refills | Status: DC | PRN

## 2019-09-09 NOTE — Progress Notes (Signed)
BP 135/83    Pulse (!) 106    Temp 100.1 F (37.8 C)    Ht '5\' 9"'$  (1.753 m)    Wt 203 lb (92.1 kg)    SpO2 96%    BMI 29.98 kg/m    Subjective:   Patient ID: Antonio Bowen, male    DOB: Mar 26, 1946, 74 y.o.   MRN: [id number]  HPI: Antonio Bowen is a 74 y.o. male presenting on 09/09/2019 for Medical Management of Chronic Issues   HPI Pain assessment: Cause of pain-lumbar and hip pain going down his leg Pain location-caused by degenerative disc disease with nerve compression, sees an orthopedic back doctor Pain on scale of 1-10- 6 Frequency-Daily What increases pain-laying and standing What makes pain Better-tramadol and meloxicam Effects on ADL -limits prolonged walking Any change in general medical condition-none  Current opioids rx-tramadol 50 mg 3 times daily as needed # meds rx-90 Effectiveness of current meds-works well Adverse reactions form pain meds-none Morphine equivalent-15  Pill count performed-No Last drug screen -N/A ( high risk q46m moderate risk q64mlow risk yearly ) Urine drug screen today- Yes Was the [place]yes  If yes were their any concerning findings? -He did get one prescription from his orthopedic doctor in between our prescriptions, its been at least 6 or 7 months since [place] No flowsheet data found.   Pain contract signed on: Today  Hypertension Patient is currently on losartan hydrochlorothiazide, and their blood pressure today is 135/83. Patient denies any lightheadedness or dizziness. Patient denies headaches, blurred vision, chest pains, shortness of breath, or weakness. Denies any side effects from medication and is content with current medication.   Hypothyroidism recheck Patient is coming in for thyroid recheck today as well. They deny any issues with hair changes or heat or cold problems or diarrhea or constipation. They deny any chest pain or palpitations. They are currently on levothyroxine 125  micrograms   Hyperlipidemia Patient is coming in for recheck of his hyperlipidemia. The patient is currently taking no medication currently for it, will check blood work. They deny any issues with myalgias or history of liver damage from it. They deny any focal numbness or weakness or chest pain.   GERD Patient is currently on omeprazole.  She denies any major symptoms or abdominal pain or belching or burping. She denies any blood in her stool or lightheadedness or dizziness.   Patient has stage III CKD and we are monitoring keep a close eye on it.  We will recheck labs today.  Patient denies any urinary issues.  Relevant past medical, surgical, family and social history reviewed and updated as indicated. Interim medical history since our last visit reviewed. Allergies and medications reviewed and updated.  Review of Systems  Constitutional: Negative for chills and fever.  Respiratory: Positive for cough. Negative for shortness of breath and wheezing.   Cardiovascular: Negative for chest pain and leg swelling.  Musculoskeletal: Positive for arthralgias and back pain. Negative for gait problem.  Skin: Negative for rash.  Neurological: Negative for dizziness, weakness and light-headedness.  All other systems reviewed and are negative.   Per HPI unless specifically indicated above   Allergies as of 09/09/2019      Reactions   Nuts Nausea And Vomiting   sweating   Other Nausea And Vomiting   Per patient, cannot take any narcotic. Nausea and vomiting sweating   Vicodin [hydrocodone-acetaminophen]    tachycardia   Codeine  Aspirin Other (See Comments)   Upset stomach, bleeding   Penicillins Hives, Rash   Has patient had a PCN reaction causing immediate rash, facial/tongue/throat swelling, SOB or lightheadedness with hypotension:No Has patient had a PCN reaction causing severe rash involving mucus membranes or skin necrosis:No Has patient had a PCN reaction that required  hospitalization:No Has patient had a PCN reaction occurring within the last 10 years:Yes If all of the above answers are "NO", then may proceed with Cephalosporin use.      Medication List       Accurate as of Sep 09, 2019  1:13 PM. If you have any questions, ask your nurse or doctor.        acetaminophen 500 MG tablet Commonly known as: TYLENOL Take 1,000 mg by mouth 2 (two) times daily as needed (for pain.).   albuterol 108 (90 Base) MCG/ACT inhaler Commonly known as: Ventolin HFA INHALE TWO PUFFS INTO LUNGS EVERY 6 HOURS AS NEEDED FOR WHEEZING   budesonide-formoterol 160-4.5 MCG/ACT inhaler Commonly known as: Symbicort Inhale 2 puffs into the lungs in the morning and at bedtime. Needs to be seen for further refills.   cetirizine 10 MG tablet Commonly known as: ZYRTEC Take 10 mg by mouth at bedtime.   fluticasone 50 MCG/ACT nasal spray Commonly known as: FLONASE PLACE 1 SPRAY INTO BOTH NOSTRILS 2 (TWO) TIMES DAILY AS NEEDED FOR ALLERGIES OR RHINITIS.   Flutter Devi Use three times daily   LEG CRAMP RELIEF PO Take 2 tablets by mouth at bedtime. Hyland's   levothyroxine 125 MCG tablet Commonly known as: SYNTHROID Take 1 tablet (125 mcg total) by mouth daily before breakfast. Patient needs to be seen for further refills   losartan-hydrochlorothiazide 50-12.5 MG tablet Commonly known as: HYZAAR TAKE 1 TABLET EVERY DAY   magnesium oxide 400 MG tablet Commonly known as: MAG-OX Take 400 mg by mouth at bedtime.   meloxicam 15 MG tablet Commonly known as: MOBIC TAKE 1 TABLET (15 MG TOTAL) BY MOUTH DAILY.   omeprazole 40 MG capsule Commonly known as: PRILOSEC TAKE 1 CAPSULE TWICE DAILY What changed:   when to take this  additional instructions   PRESCRIPTION MEDICATION Oxygen 3.0 liters at night, prn use in day   sucralfate 1 GM/10ML suspension Commonly known as: CARAFATE Take 10 mLs (1 g total) by mouth 4 (four) times daily -  with meals and at bedtime.     sucralfate 1 g tablet Commonly known as: CARAFATE TAKE 1 TABLET BY MOUTH 4 TIMES DAILY (WITH MEALS AND AT BEDTIME)   SUPER B COMPLEX/C PO Take 1 capsule by mouth at bedtime.   traMADol 50 MG tablet Commonly known as: ULTRAM Take 1 tablet (50 mg total) by mouth every 8 (eight) hours as needed.        Objective:   BP 135/83    Pulse (!) 106    Temp 100.1 F (37.8 C)    Ht '5\' 9"'$  (1.753 m)    Wt 203 lb (92.1 kg)    SpO2 96%    BMI 29.98 kg/m   Wt Readings from Last 3 Encounters:  09/09/19 203 lb (92.1 kg)  08/18/19 206 lb (93.4 kg)  08/06/19 206 lb (93.4 kg)    Physical Exam Vitals and nursing note reviewed.  Constitutional:      General: He is not in acute distress.    Appearance: He is well-developed. He is not diaphoretic.  Eyes:     General: No scleral icterus.  Conjunctiva/sclera: Conjunctivae normal.  Neck:     Thyroid: No thyromegaly.  Cardiovascular:     Rate and Rhythm: Normal rate and regular rhythm.     Heart sounds: Normal heart sounds. No murmur.  Pulmonary:     Effort: Pulmonary effort is normal. No respiratory distress.     Breath sounds: Normal breath sounds. No wheezing.  Musculoskeletal:     Cervical back: Neck supple.  Lymphadenopathy:     Cervical: No cervical adenopathy.  Skin:    General: Skin is warm and dry.     Findings: No rash.  Neurological:     Mental Status: He is alert and oriented to person, place, and time.     Coordination: Coordination normal.  Psychiatric:        Behavior: Behavior normal.       Assessment & Plan:   Problem List Items Addressed This Visit      Cardiovascular and Mediastinum   Hypertension   Relevant Medications   losartan-hydrochlorothiazide (HYZAAR) 50-12.5 MG tablet   Other Relevant Orders   CMP14+EGFR   Magnesium     Respiratory   Obstructive chronic bronchitis with exacerbation Gold B Copd   Relevant Medications   albuterol (VENTOLIN HFA) 108 (90 Base) MCG/ACT inhaler    budesonide-formoterol (SYMBICORT) 160-4.5 MCG/ACT inhaler   fluticasone (FLONASE) 50 MCG/ACT nasal spray   predniSONE (DELTASONE) 20 MG tablet   Other Relevant Orders   CBC with Differential/Platelet     Digestive   GERD (gastroesophageal reflux disease)   Relevant Medications   omeprazole (PRILOSEC) 40 MG capsule     Endocrine   Hypothyroidism - Primary (Chronic)   Relevant Medications   levothyroxine (SYNTHROID) 125 MCG tablet   Other Relevant Orders   CBC with Differential/Platelet   TSH     Genitourinary   Chronic kidney disease (CKD), stage III (moderate)   Relevant Orders   CMP14+EGFR   Magnesium     Other   Low back pain   Relevant Medications   traMADol (ULTRAM) 50 MG tablet   meloxicam (MOBIC) 15 MG tablet   predniSONE (DELTASONE) 20 MG tablet   Hyperlipidemia LDL goal <130   Relevant Medications   losartan-hydrochlorothiazide (HYZAAR) 50-12.5 MG tablet   Other Relevant Orders   Lipid panel   Magnesium    Other Visit Diagnoses    Primary osteoarthritis of left knee       Relevant Medications   traMADol (ULTRAM) 50 MG tablet   meloxicam (MOBIC) 15 MG tablet   predniSONE (DELTASONE) 20 MG tablet   Other Relevant Orders   ToxASSURE Select 13 (MW), Urine      Continue current medication, will give short course of prednisone for arthritis and back.  He will continue see orthopedic for this. Follow up plan: Return in about 3 months (around 12/10/2019), or if symptoms worsen or fail to improve, for COPD and pain recheck.  Counseling provided for all of the vaccine components No orders of the defined types were placed in this encounter.   Caryl Pina, MD [facility] 09/09/2019, 1:13 PM

## 2019-09-10 LAB — TSH: TSH: 2.83 u[IU]/mL (ref 0.450–4.500)

## 2019-09-10 LAB — CBC WITH DIFFERENTIAL/PLATELET
Basophils Absolute: 0 10*3/uL (ref 0.0–0.2)
Basos: 0 %
EOS (ABSOLUTE): 0.1 10*3/uL (ref 0.0–0.4)
Eos: 0 %
Hematocrit: 47.8 % (ref 37.5–51.0)
Hemoglobin: 16.6 g/dL (ref 13.0–17.7)
Immature Grans (Abs): 0.1 10*3/uL (ref 0.0–0.1)
Immature Granulocytes: 1 %
Lymphocytes Absolute: 3.1 10*3/uL (ref 0.7–3.1)
Lymphs: 23 %
MCH: 31.6 pg (ref 26.6–33.0)
MCHC: 34.7 g/dL (ref 31.5–35.7)
MCV: 91 fL (ref 79–97)
Monocytes Absolute: 0.8 10*3/uL (ref 0.1–0.9)
Monocytes: 6 %
Neutrophils Absolute: 9.2 10*3/uL — ABNORMAL HIGH (ref 1.4–7.0)
Neutrophils: 70 %
Platelets: 190 10*3/uL (ref 150–450)
RBC: 5.26 x10E6/uL (ref 4.14–5.80)
RDW: 12.8 % (ref 11.6–15.4)
WBC: 13.4 10*3/uL — ABNORMAL HIGH (ref 3.4–10.8)

## 2019-09-10 LAB — CMP14+EGFR
ALT: 31 IU/L (ref 0–44)
AST: 31 IU/L (ref 0–40)
Albumin/Globulin Ratio: 2 (ref 1.2–2.2)
Albumin: 4.4 g/dL (ref 3.7–4.7)
Alkaline Phosphatase: 81 IU/L (ref 39–117)
BUN/Creatinine Ratio: 9 — ABNORMAL LOW (ref 10–24)
BUN: 15 mg/dL (ref 8–27)
Bilirubin Total: 0.5 mg/dL (ref 0.0–1.2)
CO2: 23 mmol/L (ref 20–29)
Calcium: 9 mg/dL (ref 8.6–10.2)
Chloride: 104 mmol/L (ref 96–106)
Creatinine, Ser: 1.67 mg/dL — ABNORMAL HIGH (ref 0.76–1.27)
GFR calc Af Amer: 46 mL/min/{1.73_m2} — ABNORMAL LOW (ref >59)
GFR calc non Af Amer: 40 mL/min/{1.73_m2} — ABNORMAL LOW (ref >59)
Globulin, Total: 2.2 g/dL (ref 1.5–4.5)
Glucose: 110 mg/dL — ABNORMAL HIGH (ref 65–99)
Potassium: 4 mmol/L (ref 3.5–5.2)
Sodium: 142 mmol/L (ref 134–144)
Total Protein: 6.6 g/dL (ref 6.0–8.5)

## 2019-09-10 LAB — LIPID PANEL
Chol/HDL Ratio: 1.6 ratio (ref 0.0–5.0)
Cholesterol, Total: 104 mg/dL (ref 100–199)
HDL: 64 mg/dL (ref >39)
LDL Chol Calc (NIH): 21 mg/dL (ref 0–99)
Triglycerides: 105 mg/dL (ref 0–149)
VLDL Cholesterol Cal: 19 mg/dL (ref 5–40)

## 2019-09-10 LAB — MAGNESIUM: Magnesium: 2.1 mg/dL (ref 1.6–2.3)

## 2019-09-15 DIAGNOSIS — M5136 Other intervertebral disc degeneration, lumbar region: Secondary | ICD-10-CM | POA: Diagnosis not present

## 2019-09-15 LAB — TOXASSURE SELECT 13 (MW), URINE

## 2019-09-17 ENCOUNTER — Telehealth (INDEPENDENT_AMBULATORY_CARE_PROVIDER_SITE_OTHER): Payer: Medicare HMO | Admitting: Family Medicine

## 2019-09-17 ENCOUNTER — Telehealth: Payer: Self-pay | Admitting: Family Medicine

## 2019-09-17 NOTE — Telephone Encounter (Signed)
1 sorry that he is upset about his results, but it is for [place] to know about what prescriptions he is getting for elsewhere, that is why we are his primary care physician.  If he is getting prescriptions from another provider that can explain the clonazepam then we are more than happy to accept it but with a negative urine drug screen we cannot prescribe substances any further without a proper explanation.  We are more than happy to continue to see him for other issues.

## 2019-09-17 NOTE — Telephone Encounter (Signed)
Pt returned missed call from [facility] regarding his Toxassure results. Reviewed results with pt per Dr Neldon Mc notes. Pt was very upset with the results and said that it is none of Dr Neldon Mc business what prescriptions he may being taking by other providers and that he would tell Dr Building control surveyor that himself.

## 2019-09-17 NOTE — Telephone Encounter (Signed)
Erroneous note

## 2019-09-17 NOTE — Telephone Encounter (Signed)
rc for nurse 

## 2019-09-17 NOTE — Telephone Encounter (Signed)
lmtcb

## 2019-09-18 DIAGNOSIS — M5137 Other intervertebral disc degeneration, lumbosacral region: Secondary | ICD-10-CM | POA: Diagnosis not present

## 2019-09-18 DIAGNOSIS — M5416 Radiculopathy, lumbar region: Secondary | ICD-10-CM | POA: Diagnosis not present

## 2019-09-18 DIAGNOSIS — M5116 Intervertebral disc disorders with radiculopathy, lumbar region: Secondary | ICD-10-CM | POA: Diagnosis not present

## 2019-09-18 NOTE — Telephone Encounter (Signed)
Okay sounds good, we will discuss it at his next visit

## 2019-09-18 NOTE — Telephone Encounter (Signed)
Pt returned missed call from [facility] regarding lab results for Toxassure. Reviewed Dr Neldon Mc note with pt. Pt said he wanted [place] to tear up the contract that he signed because nobody told him in full what the contract rules were. I asked pt if he read the contract before signing and he said no. He said he didn't have time to. Pt said he just wants to speak with Dr [name] about all of this at his next visit with him.

## 2019-09-30 DIAGNOSIS — J449 Chronic obstructive pulmonary disease, unspecified: Secondary | ICD-10-CM | POA: Diagnosis not present

## 2019-10-01 ENCOUNTER — Other Ambulatory Visit: Payer: Self-pay | Admitting: Gastroenterology

## 2019-10-02 DIAGNOSIS — M5416 Radiculopathy, lumbar region: Secondary | ICD-10-CM | POA: Diagnosis not present

## 2019-10-02 DIAGNOSIS — M4316 Spondylolisthesis, lumbar region: Secondary | ICD-10-CM | POA: Diagnosis not present

## 2019-10-23 DIAGNOSIS — M5116 Intervertebral disc disorders with radiculopathy, lumbar region: Secondary | ICD-10-CM | POA: Diagnosis not present

## 2019-10-23 DIAGNOSIS — M5136 Other intervertebral disc degeneration, lumbar region: Secondary | ICD-10-CM | POA: Diagnosis not present

## 2019-10-30 DIAGNOSIS — J449 Chronic obstructive pulmonary disease, unspecified: Secondary | ICD-10-CM | POA: Diagnosis not present

## 2019-11-03 ENCOUNTER — Observation Stay (HOSPITAL_COMMUNITY)
Admission: EM | Admit: 2019-11-03 | Discharge: 2019-11-05 | Disposition: A | Discharge status: Home / Self Care | Payer: Medicare HMO | Attending: Internal Medicine | Admitting: Internal Medicine

## 2019-11-03 ENCOUNTER — Emergency Department (HOSPITAL_COMMUNITY): Payer: Medicare HMO

## 2019-11-03 ENCOUNTER — Inpatient Hospital Stay (HOSPITAL_COMMUNITY): Payer: Medicare HMO

## 2019-11-03 ENCOUNTER — Encounter (HOSPITAL_COMMUNITY): Payer: Self-pay | Admitting: Emergency Medicine

## 2019-11-03 DIAGNOSIS — I129 Hypertensive chronic kidney disease with stage 1 through stage 4 chronic kidney disease, or unspecified chronic kidney disease: Secondary | ICD-10-CM | POA: Diagnosis not present

## 2019-11-03 DIAGNOSIS — Z9981 Dependence on supplemental oxygen: Secondary | ICD-10-CM | POA: Diagnosis not present

## 2019-11-03 DIAGNOSIS — Z7951 Long term (current) use of inhaled steroids: Secondary | ICD-10-CM | POA: Insufficient documentation

## 2019-11-03 DIAGNOSIS — Z791 Long term (current) use of non-steroidal anti-inflammatories (NSAID): Secondary | ICD-10-CM | POA: Diagnosis not present

## 2019-11-03 DIAGNOSIS — Z79899 Other long term (current) drug therapy: Secondary | ICD-10-CM | POA: Diagnosis not present

## 2019-11-03 DIAGNOSIS — Z20822 Contact with and (suspected) exposure to covid-19: Secondary | ICD-10-CM | POA: Insufficient documentation

## 2019-11-03 DIAGNOSIS — I6781 Acute cerebrovascular insufficiency: Secondary | ICD-10-CM | POA: Diagnosis not present

## 2019-11-03 DIAGNOSIS — E039 Hypothyroidism, unspecified: Secondary | ICD-10-CM | POA: Diagnosis not present

## 2019-11-03 DIAGNOSIS — N183 Chronic kidney disease, stage 3 unspecified: Secondary | ICD-10-CM | POA: Diagnosis present

## 2019-11-03 DIAGNOSIS — Z880 Allergy status to penicillin: Secondary | ICD-10-CM | POA: Insufficient documentation

## 2019-11-03 DIAGNOSIS — I6782 Cerebral ischemia: Secondary | ICD-10-CM | POA: Insufficient documentation

## 2019-11-03 DIAGNOSIS — Z79890 Hormone replacement therapy (postmenopausal): Secondary | ICD-10-CM | POA: Insufficient documentation

## 2019-11-03 DIAGNOSIS — Z96641 Presence of right artificial hip joint: Secondary | ICD-10-CM | POA: Diagnosis not present

## 2019-11-03 DIAGNOSIS — R269 Unspecified abnormalities of gait and mobility: Secondary | ICD-10-CM | POA: Diagnosis not present

## 2019-11-03 DIAGNOSIS — Z885 Allergy status to narcotic agent status: Secondary | ICD-10-CM | POA: Diagnosis not present

## 2019-11-03 DIAGNOSIS — J8489 Other specified interstitial pulmonary diseases: Secondary | ICD-10-CM | POA: Insufficient documentation

## 2019-11-03 DIAGNOSIS — Z03818 Encounter for observation for suspected exposure to other biological agents ruled out: Secondary | ICD-10-CM | POA: Diagnosis not present

## 2019-11-03 DIAGNOSIS — K222 Esophageal obstruction: Secondary | ICD-10-CM

## 2019-11-03 DIAGNOSIS — N1831 Chronic kidney disease, stage 3a: Secondary | ICD-10-CM | POA: Insufficient documentation

## 2019-11-03 DIAGNOSIS — Z8673 Personal history of transient ischemic attack (TIA), and cerebral infarction without residual deficits: Secondary | ICD-10-CM | POA: Insufficient documentation

## 2019-11-03 DIAGNOSIS — I10 Essential (primary) hypertension: Secondary | ICD-10-CM | POA: Diagnosis present

## 2019-11-03 DIAGNOSIS — M1990 Unspecified osteoarthritis, unspecified site: Secondary | ICD-10-CM | POA: Diagnosis not present

## 2019-11-03 DIAGNOSIS — I639 Cerebral infarction, unspecified: Secondary | ICD-10-CM

## 2019-11-03 DIAGNOSIS — Z886 Allergy status to analgesic agent status: Secondary | ICD-10-CM | POA: Insufficient documentation

## 2019-11-03 DIAGNOSIS — S0990XA Unspecified injury of head, initial encounter: Secondary | ICD-10-CM | POA: Diagnosis not present

## 2019-11-03 DIAGNOSIS — K219 Gastro-esophageal reflux disease without esophagitis: Secondary | ICD-10-CM | POA: Diagnosis not present

## 2019-11-03 DIAGNOSIS — Z87891 Personal history of nicotine dependence: Secondary | ICD-10-CM | POA: Insufficient documentation

## 2019-11-03 DIAGNOSIS — R531 Weakness: Secondary | ICD-10-CM

## 2019-11-03 DIAGNOSIS — E785 Hyperlipidemia, unspecified: Secondary | ICD-10-CM | POA: Insufficient documentation

## 2019-11-03 DIAGNOSIS — I6381 Other cerebral infarction due to occlusion or stenosis of small artery: Secondary | ICD-10-CM | POA: Diagnosis not present

## 2019-11-03 DIAGNOSIS — R1310 Dysphagia, unspecified: Secondary | ICD-10-CM | POA: Insufficient documentation

## 2019-11-03 LAB — CBC
HCT: 51.4 % (ref 39.0–52.0)
Hemoglobin: 17.5 g/dL — ABNORMAL HIGH (ref 13.0–17.0)
MCH: 32.3 pg (ref 26.0–34.0)
MCHC: 34 g/dL (ref 30.0–36.0)
MCV: 94.8 fL (ref 80.0–100.0)
Platelets: 195 10*3/uL (ref 150–400)
RBC: 5.42 MIL/uL (ref 4.22–5.81)
RDW: 13.2 % (ref 11.5–15.5)
WBC: 10.9 10*3/uL — ABNORMAL HIGH (ref 4.0–10.5)
nRBC: 0 % (ref 0.0–0.2)

## 2019-11-03 LAB — COMPREHENSIVE METABOLIC PANEL
ALT: 27 U/L (ref 0–44)
AST: 25 U/L (ref 15–41)
Albumin: 4.1 g/dL (ref 3.5–5.0)
Alkaline Phosphatase: 64 U/L (ref 38–126)
Anion gap: 12 (ref 5–15)
BUN: 12 mg/dL (ref 8–23)
CO2: 25 mmol/L (ref 22–32)
Calcium: 9.4 mg/dL (ref 8.9–10.3)
Chloride: 101 mmol/L (ref 98–111)
Creatinine, Ser: 1.71 mg/dL — ABNORMAL HIGH (ref 0.61–1.24)
GFR calc Af Amer: 45 mL/min — ABNORMAL LOW (ref >60)
GFR calc non Af Amer: 39 mL/min — ABNORMAL LOW (ref >60)
Glucose, Bld: 117 mg/dL — ABNORMAL HIGH (ref 70–99)
Potassium: 3.4 mmol/L — ABNORMAL LOW (ref 3.5–5.1)
Sodium: 138 mmol/L (ref 135–145)
Total Bilirubin: 0.8 mg/dL (ref 0.3–1.2)
Total Protein: 7.2 g/dL (ref 6.5–8.1)

## 2019-11-03 LAB — I-STAT CHEM 8, ED
BUN: 15 mg/dL (ref 8–23)
Calcium, Ion: 1.21 mmol/L (ref 1.15–1.40)
Chloride: 101 mmol/L (ref 98–111)
Creatinine, Ser: 1.7 mg/dL — ABNORMAL HIGH (ref 0.61–1.24)
Glucose, Bld: 114 mg/dL — ABNORMAL HIGH (ref 70–99)
HCT: 50 % (ref 39.0–52.0)
Hemoglobin: 17 g/dL (ref 13.0–17.0)
Potassium: 3.4 mmol/L — ABNORMAL LOW (ref 3.5–5.1)
Sodium: 141 mmol/L (ref 135–145)
TCO2: 29 mmol/L (ref 22–32)

## 2019-11-03 LAB — CBG MONITORING, ED: Glucose-Capillary: 105 mg/dL — ABNORMAL HIGH (ref 70–99)

## 2019-11-03 LAB — DIFFERENTIAL
Abs Immature Granulocytes: 0.14 10*3/uL — ABNORMAL HIGH (ref 0.00–0.07)
Basophils Absolute: 0.1 10*3/uL (ref 0.0–0.1)
Basophils Relative: 1 %
Eosinophils Absolute: 0.1 10*3/uL (ref 0.0–0.5)
Eosinophils Relative: 1 %
Immature Granulocytes: 1 %
Lymphocytes Relative: 38 %
Lymphs Abs: 4.1 10*3/uL — ABNORMAL HIGH (ref 0.7–4.0)
Monocytes Absolute: 0.6 10*3/uL (ref 0.1–1.0)
Monocytes Relative: 5 %
Neutro Abs: 6 10*3/uL (ref 1.7–7.7)
Neutrophils Relative %: 54 %

## 2019-11-03 LAB — LIPID PANEL
Cholesterol: 99 mg/dL (ref 0–200)
HDL: 52 mg/dL (ref >40)
LDL Cholesterol: 35 mg/dL (ref 0–99)
Total CHOL/HDL Ratio: 1.9 RATIO
Triglycerides: 58 mg/dL (ref <150)
VLDL: 12 mg/dL (ref 0–40)

## 2019-11-03 LAB — PROTIME-INR
INR: 0.9 (ref 0.8–1.2)
Prothrombin Time: 12 seconds (ref 11.4–15.2)

## 2019-11-03 LAB — APTT: aPTT: 28 seconds (ref 24–36)

## 2019-11-03 LAB — SARS CORONAVIRUS 2 BY RT PCR (HOSPITAL ORDER, PERFORMED IN ~~LOC~~ HOSPITAL LAB): SARS Coronavirus 2: NEGATIVE

## 2019-11-03 MED ORDER — LORAZEPAM 1 MG PO TABS: 0.5000 mg | ORAL_TABLET | Freq: Four times a day (QID) | ORAL | Status: DC | PRN

## 2019-11-03 MED ORDER — LORAZEPAM 0.5 MG PO TABS: 0.5000 mg | ORAL_TABLET | Freq: Four times a day (QID) | ORAL | Status: DC | PRN

## 2019-11-03 MED ORDER — SUCRALFATE 1 GM/10ML PO SUSP: 1.0000 g | Freq: Three times a day (TID) | ORAL | Status: DC

## 2019-11-03 MED ORDER — PANTOPRAZOLE SODIUM 40 MG PO TBEC
40.0000 mg | DELAYED_RELEASE_TABLET | Freq: Every day | ORAL | Status: DC
  Administered 2019-11-04: 40 mg via ORAL
  Filled 2019-11-03 (×2): qty 1

## 2019-11-03 MED ORDER — ONDANSETRON HCL 4 MG/2ML IJ SOLN: 4.0000 mg | Freq: Four times a day (QID) | INTRAMUSCULAR | Status: DC | PRN

## 2019-11-03 MED ORDER — ALBUTEROL SULFATE HFA 108 (90 BASE) MCG/ACT IN AERS
1.0000 | INHALATION_SPRAY | RESPIRATORY_TRACT | Status: DC | PRN
  Administered 2019-11-04: 2 via RESPIRATORY_TRACT
  Filled 2019-11-03 (×2): qty 6.7

## 2019-11-03 MED ORDER — SENNOSIDES-DOCUSATE SODIUM 8.6-50 MG PO TABS: 1.0000 | ORAL_TABLET | Freq: Every evening | ORAL | Status: DC | PRN

## 2019-11-03 MED ORDER — LEVOTHYROXINE SODIUM 25 MCG PO TABS
125.0000 ug | ORAL_TABLET | Freq: Every day | ORAL | Status: DC
  Administered 2019-11-04 – 2019-11-05 (×2): 125 ug via ORAL
  Filled 2019-11-03 (×2): qty 1

## 2019-11-03 MED ORDER — ACETAMINOPHEN 500 MG PO TABS: 1000.0000 mg | ORAL_TABLET | Freq: Two times a day (BID) | ORAL | Status: DC | PRN

## 2019-11-03 MED ORDER — LORATADINE 10 MG PO TABS
10.0000 mg | ORAL_TABLET | Freq: Every day | ORAL | Status: DC
  Filled 2019-11-03 (×3): qty 1

## 2019-11-03 MED ORDER — HYDRALAZINE HCL 25 MG PO TABS: 25.0000 mg | ORAL_TABLET | Freq: Four times a day (QID) | ORAL | Status: DC | PRN

## 2019-11-03 MED ORDER — ATORVASTATIN CALCIUM 40 MG PO TABS
40.0000 mg | ORAL_TABLET | Freq: Every day | ORAL | Status: DC
  Administered 2019-11-03 – 2019-11-05 (×3): 40 mg via ORAL
  Filled 2019-11-03 (×3): qty 1

## 2019-11-03 MED ORDER — SODIUM CHLORIDE 0.9% FLUSH
3.0000 mL | Freq: Once | INTRAVENOUS | Status: AC
  Administered 2019-11-03: 3 mL via INTRAVENOUS

## 2019-11-03 MED ORDER — STROKE: EARLY STAGES OF RECOVERY BOOK
Freq: Once | Status: AC
  Filled 2019-11-03 (×2): qty 1

## 2019-11-03 MED ORDER — HEPARIN SODIUM (PORCINE) 5000 UNIT/ML IJ SOLN
5000.0000 [IU] | Freq: Two times a day (BID) | INTRAMUSCULAR | Status: DC
  Administered 2019-11-03 – 2019-11-05 (×4): 5000 [IU] via SUBCUTANEOUS
  Filled 2019-11-03 (×4): qty 1

## 2019-11-03 MED ORDER — CLOPIDOGREL BISULFATE 75 MG PO TABS
75.0000 mg | ORAL_TABLET | Freq: Every day | ORAL | Status: DC
  Administered 2019-11-03 – 2019-11-05 (×3): 75 mg via ORAL
  Filled 2019-11-03 (×3): qty 1

## 2019-11-03 MED ORDER — LORAZEPAM 2 MG/ML IJ SOLN: 1.0000 mg | Freq: Four times a day (QID) | INTRAMUSCULAR | Status: DC | PRN

## 2019-11-03 MED ORDER — SUCRALFATE 1 G PO TABS
1.0000 g | ORAL_TABLET | Freq: Three times a day (TID) | ORAL | Status: DC
  Administered 2019-11-03 – 2019-11-04 (×2): 1 g via ORAL
  Filled 2019-11-03 (×5): qty 1

## 2019-11-03 MED ORDER — MELOXICAM 7.5 MG PO TABS
15.0000 mg | ORAL_TABLET | Freq: Every day | ORAL | Status: DC
  Administered 2019-11-05: 15 mg via ORAL
  Filled 2019-11-03 (×2): qty 2

## 2019-11-03 MED ORDER — MAGNESIUM OXIDE 400 (241.3 MG) MG PO TABS
400.0000 mg | ORAL_TABLET | Freq: Every day | ORAL | Status: DC
  Administered 2019-11-03 – 2019-11-05 (×2): 400 mg via ORAL
  Filled 2019-11-03 (×4): qty 1

## 2019-11-03 MED ORDER — FLUTICASONE PROPIONATE 50 MCG/ACT NA SUSP
1.0000 | Freq: Two times a day (BID) | NASAL | Status: DC | PRN
  Filled 2019-11-03: qty 16

## 2019-11-03 MED ORDER — LORAZEPAM 2 MG/ML IJ SOLN: 0.5000 mg | Freq: Four times a day (QID) | INTRAMUSCULAR | Status: DC | PRN

## 2019-11-03 MED ORDER — FLUTICASONE FUROATE-VILANTEROL 200-25 MCG/INH IN AEPB
1.0000 | INHALATION_SPRAY | Freq: Every day | RESPIRATORY_TRACT | Status: DC
  Administered 2019-11-04 – 2019-11-05 (×2): 1 via RESPIRATORY_TRACT
  Filled 2019-11-03: qty 28

## 2019-11-03 NOTE — ED Triage Notes (Addendum)
Pt reports that upon waking up yesterday morning, his arm felt heavy like it was difficult to move, reports as the day went on the symptoms worsened, upon waking this morning he was unable to move his L arm at all. Also endorses some tingling to the L thigh and L face. Pt a/ox4, speech clear, resp e/u, nad. No facial droop noted.

## 2019-11-03 NOTE — Progress Notes (Signed)
D/W oncall neurology attending, suggests either 81 mg of ASA or Plavix. Given condition of CKD and he is already on one NSAID, will continue Plavix for now.

## 2019-11-03 NOTE — H&P (Signed)
History and Physical    Antonio Bowen DJT:[id number] DOB: 1945/05/25 DOA: 11/03/2019  PCP: [name], [name], MD (Confirm with patient/family/NH records and if not entered, this has to be entered at [facility] point of entry) Patient coming from: Home  I have personally briefly reviewed patient's old medical records in [place]  Chief Complaint: Left sided weakness  HPI: Antonio Bowen is a 74 y.o. male with medical history significant of remote stroke not tolerating aspirin for severe hemoptysis status post bronchoscopy, severe multijoint OA on Mobic not tolerating narcotic or other NSAIDs, retention, hypothyroidism, COPD, GERD presented with new onset of left arm weakness and unsteady gait.  Symptoms started yesterday, persisted to this morning, also developed new onset of left arm numbness below elbow this morning.  Denies any fall or loss of consciousness blurred vision, denies any weakness or numbness of any of the other limbs. ED Course: CT head: 9 mm infarct within the right cerebellum, new from prior head CT 02/18/2015 but otherwise age indeterminate. A lacunar infarct within the left basal ganglia/external capsule is also new from this prior exam but appears chronic.  Review of Systems: As per HPI otherwise 10 point review of systems negative.    Past Medical History:  Diagnosis Date  . Allergic rhinitis   . Arthritis   . BOOP (bronchiolitis obliterans with organizing pneumonia) ([place])   . Bronchitis   . Colon polyps   . COPD (chronic obstructive pulmonary disease) ([place])   . Dyspnea    with exertion   . Dysrhythmia    skipped beats- followed by Dr Angelena Form   . GERD (gastroesophageal reflux disease)   . Hypertension   . Hypothyroidism   . Kidney stone   . Low back pain   . Oxygen dependent    uses 3L/[place] at nite occasional during day if needed   . Stroke [facility])    hx of 2 strokes which patient did not know he had had   . Thyroid disease   . Tobacco dependence      Past Surgical History:  Procedure Laterality Date  . BIOPSY  08/18/2019   Procedure: BIOPSY;  Surgeon: Irene Shipper, MD;  Location: [facility];  Service: Endoscopy;;  . broncoscopy     . carpel tunnal     left  . COLONOSCOPY     x 4  . DUPUYTREN CONTRACTURE RELEASE     Left  . ESOPHAGOGASTRODUODENOSCOPY (EGD) WITH PROPOFOL N/A 03/26/2016   Procedure: ESOPHAGOGASTRODUODENOSCOPY (EGD) WITH PROPOFOL;  Surgeon: Irene Shipper, MD;  Location: [facility]COPY;  Service: Endoscopy;  Laterality: N/A;  . ESOPHAGOGASTRODUODENOSCOPY (EGD) WITH PROPOFOL N/A 08/18/2019   Procedure: ESOPHAGOGASTRODUODENOSCOPY (EGD) WITH PROPOFOL;  Surgeon: Irene Shipper, MD;  Location: [facility] ENDOSCOPY;  Service: Endoscopy;  Laterality: N/A;  Possible dilation  . ESOPHAGOGASTRODUODENOSCOPY ENDOSCOPY     x 3  . FACIAL FRACTURE SURGERY    . OTHER SURGICAL HISTORY     facial surgery  . SAVORY DILATION N/A 08/18/2019   Procedure: SAVORY DILATION;  Surgeon: Irene Shipper, MD;  Location: [facility];  Service: Endoscopy;  Laterality: N/A;  . TOTAL HIP ARTHROPLASTY  01/2011   Right hip, [place]  . ULNAR NERVE TRANSPOSITION     left     reports that he quit smoking about 9 years ago. His smoking use included cigarettes. He has a 50.00 pack-year smoking history. His smokeless tobacco use includes snuff. He reports current alcohol use. He reports that he does  not use drugs.  Allergies  Allergen Reactions  . Nuts Nausea And Vomiting    sweating  . Other Nausea And Vomiting    Per patient, cannot take any narcotic. Nausea and vomiting sweating  . Vicodin [Hydrocodone-Acetaminophen]     tachycardia  . Codeine   . Aspirin Other (See Comments)    Upset stomach, bleeding  . Penicillins Hives and Rash    Has patient had a PCN reaction causing immediate rash, facial/tongue/throat swelling, SOB or lightheadedness with hypotension:No Has patient had a PCN reaction causing severe rash involving mucus membranes or skin  necrosis:No Has patient had a PCN reaction that required hospitalization:No Has patient had a PCN reaction occurring within the last 10 years:Yes If all of the above answers are "NO", then may proceed with Cephalosporin use.     Family History  Problem Relation Age of Onset  . Arthritis Father   . Stroke Father 54  . Prostate cancer Maternal Grandfather   . Stomach cancer Maternal Grandmother   . Cancer Paternal Grandmother   . Colon cancer Other        mat 1st cousin  . Cancer Mother   . Healthy Daughter   . Healthy Son   . Pancreatic cancer Neg Hx   . Esophageal cancer Neg Hx     Prior to Admission medications   Medication Sig Start Date End Date Taking? Authorizing Provider  acetaminophen (TYLENOL) 500 MG tablet Take 1,000 mg by mouth 2 (two) times daily as needed (for pain.).   Yes [provider]  albuterol (VENTOLIN HFA) 108 (90 Base) MCG/ACT inhaler Inhale 1-2 puffs into the lungs every 4 (four) hours as needed for wheezing or shortness of breath. INHALE TWO PUFFS INTO LUNGS EVERY 6 HOURS AS NEEDED FOR WHEEZING 09/09/19  Yes [name], [name], MD  budesonide-formoterol (SYMBICORT) 160-4.5 MCG/ACT inhaler Inhale 2 puffs into the lungs in the morning and at bedtime. Needs to be seen for further refills. 09/09/19  Yes [name], [name], MD  cetirizine (ZYRTEC) 10 MG tablet Take 10 mg by mouth at bedtime.    Yes [provider]  fluticasone (FLONASE) 50 MCG/ACT nasal spray Place 1 spray into both nostrils 2 (two) times daily as needed for allergies or rhinitis. 09/09/19  Yes [name], [name], MD  Homeopathic Products (LEG CRAMP RELIEF PO) Take 2 tablets by mouth at bedtime. [employer]   Yes [provider]  levothyroxine (SYNTHROID) 125 MCG tablet Take 1 tablet (125 mcg total) by mouth daily before breakfast. 09/09/19  Yes [name], [name], MD  losartan-hydrochlorothiazide (HYZAAR) 50-12.5 MG tablet Take 1 tablet by mouth daily. 09/09/19  Yes  [name], [name], MD  magnesium oxide (MAG-OX) 400 MG tablet Take 400 mg by mouth at bedtime.    Yes [provider]  meloxicam (MOBIC) 15 MG tablet Take 1 tablet (15 mg total) by mouth daily. 09/09/19  Yes [name], [name], MD  omeprazole (PRILOSEC) 40 MG capsule Take 1 capsule (40 mg total) by mouth 2 (two) times daily. 09/09/19  Yes [name], [name], MD  PRESCRIPTION MEDICATION Oxygen 3.0 liters at night, prn use in day   Yes [provider]  Respiratory Therapy Supplies (FLUTTER) DEVI Use three times daily 01/04/11  Yes Elsie Stain, MD  sucralfate (CARAFATE) 1 g tablet TAKE 1 TABLET BY MOUTH 4 TIMES DAILY (WITH MEALS AND AT BEDTIME) Patient taking differently: Take 1 g by mouth as needed (ulcers).  10/01/19  Yes Zehr, Laban Emperor,  PA-C  SUPER B COMPLEX/C PO Take 1 capsule by mouth at bedtime.   Yes [provider]  traMADol (ULTRAM) 50 MG tablet Take 1 tablet (50 mg total) by mouth every 8 (eight) hours as needed. 09/09/19  Yes [name], [name], MD  sucralfate (CARAFATE) 1 GM/10ML suspension Take 10 mLs (1 g total) by mouth 4 (four) times daily -  with meals and at bedtime. Patient not taking: Reported on 11/03/2019 08/06/19   Loralie Champagne, PA-C    Physical Exam: Vitals:   11/03/19 1153 11/03/19 1203 11/03/19 1208 11/03/19 1213  BP: (!) 150/116     Pulse:  78 70 71  Resp: 16 17 11 14   Temp:      SpO2:  91% 99% 90%    Constitutional: NAD, calm, comfortable Vitals:   11/03/19 1153 11/03/19 1203 11/03/19 1208 11/03/19 1213  BP: (!) 150/116     Pulse:  78 70 71  Resp: 16 17 11 14   Temp:      SpO2:  91% 99% 90%   Eyes: PERRL, lids and conjunctivae normal ENMT: Mucous membranes are moist. Posterior pharynx clear of any exudate or lesions.Normal dentition.  Neck: normal, supple, no masses, no thyromegaly Respiratory: clear to auscultation bilaterally, no wheezing, no crackles. Normal respiratory effort. No accessory muscle use.  Cardiovascular:  Regular rate and rhythm, no murmurs / rubs / gallops. No extremity edema. 2+ pedal pulses. No carotid bruits.  Abdomen: no tenderness, no masses palpated. No hepatosplenomegaly. Bowel sounds positive.  Musculoskeletal: no clubbing / cyanosis. No joint deformity upper and lower extremities. Good ROM, no contractures. Normal muscle tone.  Skin: no rashes, lesions, ulcers. No induration Neurologic: CN 2-12 grossly intact. Sensation intact, DTR normal. Strength 3/5 on left arm below deltoid compared to 5/5 on the right side, lower extremity strength 4/5 on both sides.  Decreased heel-to-shin on left side Psychiatric: Normal judgment and insight. Alert and oriented x 3. Normal mood.     Labs on Admission: I have personally reviewed following labs and imaging studies  CBC: Recent Labs  Lab 11/03/19 1140 11/03/19 1157  WBC 10.9*  --   NEUTROABS 6.0  --   HGB 17.5* 17.0  HCT 51.4 50.0  MCV 94.8  --   PLT 195  --    Basic Metabolic Panel: Recent Labs  Lab 11/03/19 1140 11/03/19 1157  NA 138 141  K 3.4* 3.4*  CL 101 101  CO2 25  --   GLUCOSE 117* 114*  BUN 12 15  CREATININE 1.71* 1.70*  CALCIUM 9.4  --    GFR: CrCl cannot be calculated (Unknown ideal weight.). Liver Function Tests: Recent Labs  Lab 11/03/19 1140  AST 25  ALT 27  ALKPHOS 64  BILITOT 0.8  PROT 7.2  ALBUMIN 4.1   No results for input(s): LIPASE, AMYLASE in the last 168 hours. No results for input(s): AMMONIA in the last 168 hours. Coagulation Profile: Recent Labs  Lab 11/03/19 1140  INR 0.9   Cardiac Enzymes: No results for input(s): CKTOTAL, CKMB, CKMBINDEX, TROPONINI in the last 168 hours. BNP (last 3 results) No results for input(s): PROBNP in the last 8760 hours. HbA1C: No results for input(s): HGBA1C in the last 72 hours. CBG: Recent Labs  Lab 11/03/19 1232  GLUCAP 105*   Lipid Profile: No results for input(s): CHOL, HDL, LDLCALC, TRIG, CHOLHDL, LDLDIRECT in the last 72 hours. Thyroid  Function Tests: No results for input(s): TSH, T4TOTAL, FREET4, T3FREE, THYROIDAB in the  last 72 hours. Anemia Panel: No results for input(s): VITAMINB12, FOLATE, FERRITIN, TIBC, IRON, RETICCTPCT in the last 72 hours. Urine analysis:    Component Value Date/Time   COLORURINE YELLOW 02/15/2011 [place] 02/15/2011 1551   LABSPEC 1.022 02/15/2011 1551   PHURINE 6.0 02/15/2011 1551   GLUCOSEU NEGATIVE 02/15/2011 1551   HGBUR TRACE (A) 02/15/2011 1551   BILIRUBINUR NEGATIVE 02/15/2011 1551   KETONESUR NEGATIVE 02/15/2011 1551   PROTEINUR NEGATIVE 02/15/2011 1551   UROBILINOGEN 1.0 02/15/2011 1551   NITRITE NEGATIVE 02/15/2011 [place] 02/15/2011 1551    Radiological Exams on Admission: CT HEAD WO CONTRAST  Result Date: 11/03/2019 CLINICAL DATA:  Possible stroke; neuro deficit, acute, stroke suspected; head trauma, focal neuro findings. Additional history provided: Unable to move left arm, tingling in left thigh and left face. EXAM: CT HEAD WITHOUT CONTRAST TECHNIQUE: Contiguous axial images were obtained from the base of the skull through the vertex without intravenous contrast. COMPARISON:  Noncontrast head CT 02/18/2015 FINDINGS: Brain: Mild generalized parenchymal atrophy. Redemonstrated small chronic lacunar infarct within the right caudate nucleus. A chronic lacunar infarct within the left lentiform nucleus/external capsule is well demarcated and appears chronic, although is new as compared to prior head CT 02/18/2015. A 9 mm infarct within the right cerebellum was not present on the prior CT and is age indeterminate. Redemonstrated small chronic infarct within the left cerebellum. Background mild ill-defined hypoattenuation within the cerebral white matter is nonspecific, but consistent with chronic small vessel ischemic disease. There is no acute intracranial hemorrhage. No acute demarcated cortical infarct is identified. No extra-axial fluid collection.  No evidence of intracranial mass. No midline shift. Vascular: No hyperdense vessel.  Atherosclerotic calcifications Skull: Normal. Negative for fracture or focal lesion. Sinuses/Orbits: Prior left maxillofacial reconstructive surgery. Visualized orbits show no acute finding. Mild ethmoid sinus mucosal thickening. No significant mastoid effusion. IMPRESSION: 9 mm infarct within the right cerebellum, new from prior head CT 02/18/2015 but otherwise age indeterminate. A lacunar infarct within the left basal ganglia/external capsule is also new from this prior exam but appears chronic. Redemonstrated small chronic infarcts within the right caudate nucleus and left cerebellum. Background mild generalized parenchymal atrophy and chronic small vessel ischemic disease. Mild ethmoid sinus mucosal thickening. Electronically Signed   By: Kellie Simmering DO   On: 11/03/2019 12:14    EKG: Independently reviewed.  Sinus rhythm no acute ST-T changes  Assessment/Plan Active Problems:   CVA (cerebral vascular accident) ([place])  (please populate well all problems here in Problem List. (For example, if patient is on BP meds at home and you resume or decide to hold them, it is a problem that needs to be her. Same for CAD, COPD, HLD and so on)  Acute CVA -Seems multifocal on CT head, MRI and MRA ordered -No history of A. fib.  According to patient and his wife, and previous CVA several years ago, patient was placed on aspirin 324, couple days later patient developed severe hemoptysis, as patient remember he coughed up a total about a cup about 12 ounce of fresh red blood and bronchoscopy showed no significant bleeding in the main airway and pulmonology recommend evaluation of branches which were never done.  But aspirin was discontinued for that reason.  Patient also reported he has severe ostial arthritis involving multiple joints mainly his back hip and knees and he has been on multiple formula of NSAIDs, Tylenol and narcotics.   Eventually he stabilized with Mobic, and  his PCP told him to not take another NSAIDs including aspirin.  But he said he never try aspirin 81 mg before. -Discussed with patient and his wife at bedside, will start Plavix instead.  Plavix 75 mg daily -Start atorvastatin, patient most recent CTA 2 months ago showed severe atherosclerotic plague on aortic arch. -Echo  Hypertension -Allow permissive hypertension for today  Severe OA -As above  CKD stage II -Creatinine level stable, patient is euvolemic  Chronic dysphagia -Has history of esophageal stenosis shown on most recent EGD in May 2021, has signs of esophagitis as well.  Continue PPI and Sucralfate.  COPD -Stable  DVT prophylaxis: Heparin subcu Code Status: Full code Family Communication: Wife at bedside Disposition Plan: Has severe stroke symptoms with gait abnormality and left-sided weakness expect more than 2 midnight hospital stay for PT evaluation, likely will need rehab. Consults called: Neurology Admission status: Telemetry admission   Lequita Halt MD [facility] Pager [phone]   11/03/2019, 2:04 PM

## 2019-11-03 NOTE — ED Provider Notes (Signed)
[facility] EMERGENCY DEPARTMENT Provider Note   CSN: [id number] Arrival date & time: 11/03/19  1128     History Chief Complaint  Patient presents with  . Stroke Symptoms    Antonio Bowen is a 74 y.o. male.  The history is provided by the patient and medical records. No language interpreter was used.   Antonio Bowen is a 74 y.o. male who presents to the Emergency Department complaining of weakness. He presents the emergency department complaining of left sided numbness that started when he woke up yesterday. He was doing well the day before. Throughout the day yesterday he felt like his numbness worsened and he developed some associated weakness. Today when he woke up he could not move his arm at all so he presented for evaluation. He denies any fevers, cough, shortness of breath, nausea, vomiting, abdominal pain. Symptoms are severe, constant, worsening. He lives with his wife. No recent illnesses.    Past Medical History:  Diagnosis Date  . Allergic rhinitis   . Arthritis   . BOOP (bronchiolitis obliterans with organizing pneumonia) ([place])   . Bronchitis   . Colon polyps   . COPD (chronic obstructive pulmonary disease) ([place])   . Dyspnea    with exertion   . Dysrhythmia    skipped beats- followed by Dr Angelena Form   . GERD (gastroesophageal reflux disease)   . Hypertension   . Hypothyroidism   . Kidney stone   . Low back pain   . Oxygen dependent    uses 3L/[place] at nite occasional during day if needed   . Stroke [facility])    hx of 2 strokes which patient did not know he had had   . Thyroid disease   . Tobacco dependence     Patient Active Problem List   Diagnosis Date Noted  . Acute CVA (cerebrovascular accident) ([place]) 11/03/2019  . Esophageal stricture   . Chronic kidney disease (CKD), stage III (moderate) 09/12/2018  . Lateral epicondylitis 07/03/2017  . Pain in joint of left elbow 06/05/2017  . Dysphagia 02/23/2016  . Schatzki's ring 02/23/2016    . Oxygen dependent 02/23/2016  . Hyperlipidemia LDL goal <130 06/01/2015  . BOOP (bronchiolitis obliterans with organizing pneumonia) ([place]) 09/29/2010  . Hearing loss, conductive, bilateral 09/21/2010  . Hypothyroidism 09/21/2010  . Obstructive chronic bronchitis with exacerbation Gold B Copd   . Hypertension   . GERD (gastroesophageal reflux disease)   . Low back pain     Past Surgical History:  Procedure Laterality Date  . BIOPSY  08/18/2019   Procedure: BIOPSY;  Surgeon: Irene Shipper, MD;  Location: [facility];  Service: Endoscopy;;  . broncoscopy     . carpel tunnal     left  . COLONOSCOPY     x 4  . DUPUYTREN CONTRACTURE RELEASE     Left  . ESOPHAGOGASTRODUODENOSCOPY (EGD) WITH PROPOFOL N/A 03/26/2016   Procedure: ESOPHAGOGASTRODUODENOSCOPY (EGD) WITH PROPOFOL;  Surgeon: Irene Shipper, MD;  Location: [facility];  Service: Endoscopy;  Laterality: N/A;  . ESOPHAGOGASTRODUODENOSCOPY (EGD) WITH PROPOFOL N/A 08/18/2019   Procedure: ESOPHAGOGASTRODUODENOSCOPY (EGD) WITH PROPOFOL;  Surgeon: Irene Shipper, MD;  Location: [facility];  Service: Endoscopy;  Laterality: N/A;  Possible dilation  . ESOPHAGOGASTRODUODENOSCOPY ENDOSCOPY     x 3  . FACIAL FRACTURE SURGERY    . OTHER SURGICAL HISTORY     facial surgery  . SAVORY DILATION N/A 08/18/2019   Procedure: SAVORY DILATION;  Surgeon: Irene Shipper,  MD;  Location: [facility];  Service: Endoscopy;  Laterality: N/A;  . TOTAL HIP ARTHROPLASTY  01/2011   Right hip, [place]  . ULNAR NERVE TRANSPOSITION     left       Family History  Problem Relation Age of Onset  . Arthritis Father   . Stroke Father 44  . Prostate cancer Maternal Grandfather   . Stomach cancer Maternal Grandmother   . Cancer Paternal Grandmother   . Colon cancer Other        mat 1st cousin  . Cancer Mother   . Healthy Daughter   . Healthy Son   . Pancreatic cancer Neg Hx   . Esophageal cancer Neg Hx     Social History   Tobacco Use  . Smoking  status: Former Smoker    Packs/day: 1.00    Years: 50.00    Pack years: 50.00    Types: Cigarettes    Quit date: 07/30/2010    Years since quitting: 9.2  . Smokeless tobacco: Current User    Types: Snuff  Vaping Use  . Vaping Use: Never used  Substance Use Topics  . Alcohol use: Yes    Comment: rare  . Drug use: No    Home Medications Prior to Admission medications   Medication Sig Start Date End Date Taking? Authorizing Provider  acetaminophen (TYLENOL) 500 MG tablet Take 1,000 mg by mouth 2 (two) times daily as needed (for pain.).   Yes [provider]  albuterol (VENTOLIN HFA) 108 (90 Base) MCG/ACT inhaler Inhale 1-2 puffs into the lungs every 4 (four) hours as needed for wheezing or shortness of breath. INHALE TWO PUFFS INTO LUNGS EVERY 6 HOURS AS NEEDED FOR WHEEZING 09/09/19  Yes [name], [name], MD  budesonide-formoterol (SYMBICORT) 160-4.5 MCG/ACT inhaler Inhale 2 puffs into the lungs in the morning and at bedtime. Needs to be seen for further refills. 09/09/19  Yes [name], [name], MD  cetirizine (ZYRTEC) 10 MG tablet Take 10 mg by mouth at bedtime.    Yes [provider]  fluticasone (FLONASE) 50 MCG/ACT nasal spray Place 1 spray into both nostrils 2 (two) times daily as needed for allergies or rhinitis. 09/09/19  Yes [name], [name], MD  Homeopathic Products (LEG CRAMP RELIEF PO) Take 2 tablets by mouth at bedtime. [employer]   Yes [provider]  levothyroxine (SYNTHROID) 125 MCG tablet Take 1 tablet (125 mcg total) by mouth daily before breakfast. 09/09/19  Yes [name], [name], MD  losartan-hydrochlorothiazide (HYZAAR) 50-12.5 MG tablet Take 1 tablet by mouth daily. 09/09/19  Yes [name], [name], MD  magnesium oxide (MAG-OX) 400 MG tablet Take 400 mg by mouth at bedtime.    Yes [provider]  meloxicam (MOBIC) 15 MG tablet Take 1 tablet (15 mg total) by mouth daily. 09/09/19  Yes [name], [name], MD  omeprazole (PRILOSEC)  40 MG capsule Take 1 capsule (40 mg total) by mouth 2 (two) times daily. 09/09/19  Yes [name], [name], MD  PRESCRIPTION MEDICATION Oxygen 3.0 liters at night, prn use in day   Yes [provider]  Respiratory Therapy Supplies (FLUTTER) DEVI Use three times daily 01/04/11  Yes Elsie Stain, MD  sucralfate (CARAFATE) 1 g tablet TAKE 1 TABLET BY MOUTH 4 TIMES DAILY (WITH MEALS AND AT BEDTIME) Patient taking differently: Take 1 g by mouth as needed (ulcers).  10/01/19  Yes Zehr, Laban Emperor, PA-C  SUPER B COMPLEX/C PO Take 1 capsule by mouth at  bedtime.   Yes [provider]  traMADol (ULTRAM) 50 MG tablet Take 1 tablet (50 mg total) by mouth every 8 (eight) hours as needed. 09/09/19  Yes [name], [name], MD  sucralfate (CARAFATE) 1 GM/10ML suspension Take 10 mLs (1 g total) by mouth 4 (four) times daily -  with meals and at bedtime. Patient not taking: Reported on 11/03/2019 08/06/19   Zehr, Janett Billow D, PA-C    Allergies    Nuts, Other, Vicodin [hydrocodone-acetaminophen], Codeine, Aspirin, and Penicillins  Review of Systems   Review of Systems  All other systems reviewed and are negative.   Physical Exam Updated Vital Signs BP (!) 150/116   Pulse 71   Temp 98.1 F (36.7 C)   Resp 14   SpO2 90%   Physical Exam Vitals and nursing note reviewed.  Constitutional:      Appearance: He is well-developed.  HENT:     Head: Normocephalic and atraumatic.  Cardiovascular:     Rate and Rhythm: Normal rate and regular rhythm.     Heart sounds: No murmur heard.   Pulmonary:     Effort: Pulmonary effort is normal. No respiratory distress.     Breath sounds: Normal breath sounds.  Abdominal:     Palpations: Abdomen is soft.     Tenderness: There is no abdominal tenderness. There is no guarding or rebound.  Musculoskeletal:        General: No tenderness.  Skin:    General: Skin is warm and dry.  Neurological:     Mental Status: He is alert and oriented to person,  place, and time.     Comments: Visual fields are grossly intact. Mild dysarthria. 2/5 left upper extremity strength, five out of five right upper extremity strength. 4 to 5 left lower extremity strength, five out of five right lower extremity strength.  Psychiatric:        Behavior: Behavior normal.     ED Results / Procedures / Treatments   Labs (all labs ordered are listed, but only abnormal results are displayed) Labs Reviewed  CBC - Abnormal; Notable for the following components:      Result Value   WBC 10.9 (*)    Hemoglobin 17.5 (*)    All other components within normal limits  DIFFERENTIAL - Abnormal; Notable for the following components:   Lymphs Abs 4.1 (*)    Abs Immature Granulocytes 0.14 (*)    All other components within normal limits  COMPREHENSIVE METABOLIC PANEL - Abnormal; Notable for the following components:   Potassium 3.4 (*)    Glucose, Bld 117 (*)    Creatinine, Ser 1.71 (*)    GFR calc non Af Amer 39 (*)    GFR calc Af Amer 45 (*)    All other components within normal limits  I-STAT CHEM 8, ED - Abnormal; Notable for the following components:   Potassium 3.4 (*)    Creatinine, Ser 1.70 (*)    Glucose, Bld 114 (*)    All other components within normal limits  CBG MONITORING, ED - Abnormal; Notable for the following components:   Glucose-Capillary 105 (*)    All other components within normal limits  SARS CORONAVIRUS 2 BY RT PCR (HOSPITAL ORDER, [facility])  PROTIME-INR  APTT  LIPID PANEL    EKG None  Radiology CT HEAD WO CONTRAST  Result Date: 11/03/2019 CLINICAL DATA:  Possible stroke; neuro deficit, acute, stroke suspected; head trauma, focal neuro findings. Additional  history provided: Unable to move left arm, tingling in left thigh and left face. EXAM: CT HEAD WITHOUT CONTRAST TECHNIQUE: Contiguous axial images were obtained from the base of the skull through the vertex without intravenous contrast. COMPARISON:   Noncontrast head CT 02/18/2015 FINDINGS: Brain: Mild generalized parenchymal atrophy. Redemonstrated small chronic lacunar infarct within the right caudate nucleus. A chronic lacunar infarct within the left lentiform nucleus/external capsule is well demarcated and appears chronic, although is new as compared to prior head CT 02/18/2015. A 9 mm infarct within the right cerebellum was not present on the prior CT and is age indeterminate. Redemonstrated small chronic infarct within the left cerebellum. Background mild ill-defined hypoattenuation within the cerebral white matter is nonspecific, but consistent with chronic small vessel ischemic disease. There is no acute intracranial hemorrhage. No acute demarcated cortical infarct is identified. No extra-axial fluid collection. No evidence of intracranial mass. No midline shift. Vascular: No hyperdense vessel.  Atherosclerotic calcifications Skull: Normal. Negative for fracture or focal lesion. Sinuses/Orbits: Prior left maxillofacial reconstructive surgery. Visualized orbits show no acute finding. Mild ethmoid sinus mucosal thickening. No significant mastoid effusion. IMPRESSION: 9 mm infarct within the right cerebellum, new from prior head CT 02/18/2015 but otherwise age indeterminate. A lacunar infarct within the left basal ganglia/external capsule is also new from this prior exam but appears chronic. Redemonstrated small chronic infarcts within the right caudate nucleus and left cerebellum. Background mild generalized parenchymal atrophy and chronic small vessel ischemic disease. Mild ethmoid sinus mucosal thickening. Electronically Signed   By: Kellie Simmering DO   On: 11/03/2019 12:14    Procedures Procedures (including critical care time)  Medications Ordered in ED Medications  sodium chloride flush (NS) 0.9 % injection 3 mL (has no administration in time range)  acetaminophen (TYLENOL) tablet 1,000 mg (has no administration in time range)  meloxicam  (MOBIC) tablet 15 mg (has no administration in time range)  levothyroxine (SYNTHROID) tablet 125 mcg (has no administration in time range)  magnesium oxide (MAG-OX) tablet 400 mg (has no administration in time range)  pantoprazole (PROTONIX) EC tablet 40 mg (has no administration in time range)  sucralfate (CARAFATE) tablet 1 g (has no administration in time range)  albuterol (VENTOLIN HFA) 108 (90 Base) MCG/ACT inhaler 1-2 puff (has no administration in time range)  fluticasone furoate-vilanterol (BREO ELLIPTA) 200-25 MCG/INH 1 puff (has no administration in time range)  loratadine (CLARITIN) tablet 10 mg (10 mg Oral Not Given 11/03/19 1441)  fluticasone (FLONASE) 50 MCG/ACT nasal spray 1 spray (has no administration in time range)   stroke: mapping our early stages of recovery book (has no administration in time range)  senna-docusate (Senokot-S) tablet 1 tablet (has no administration in time range)  heparin injection 5,000 Units (5,000 Units Subcutaneous Given 11/03/19 1443)  hydrALAZINE (APRESOLINE) tablet 25 mg (has no administration in time range)  atorvastatin (LIPITOR) tablet 40 mg (40 mg Oral Given 11/03/19 1452)  clopidogrel (PLAVIX) tablet 75 mg (75 mg Oral Given 11/03/19 1453)  LORazepam (ATIVAN) tablet 0.5 mg (has no administration in time range)  ondansetron (ZOFRAN) injection 4 mg (has no administration in time range)    ED Course  I have reviewed the triage vital signs and the nursing notes.  Pertinent labs & imaging results that were available during my care of the patient were reviewed by me and considered in my medical decision making (see chart for details).    MDM Rules/Calculators/A&P  Patient here for evaluation of left sided numbness and weakness since yesterday. He does have significant weakness on examination with mild dysarthria. Concern for acute CVA given his symptoms. CT scan does demonstrate a right cerebellar infarct that does not appear to  correlate with his current symptoms. Patient is not a TPA candidate due to duration of symptoms. Neurology consulted for recommendations. Medicine consulted for admission for further workup. Patient updated findings of studies and he is in agreement with treatment plan.  BMP with stable renal insufficiency.  Final Clinical Impression(s) / ED Diagnoses Final diagnoses:  Acute CVA (cerebrovascular accident) [facility])    Rx / [place] Orders ED Discharge Orders    None       Quintella Reichert, MD 11/03/19 1520

## 2019-11-03 NOTE — ED Notes (Signed)
Snack bag provided

## 2019-11-03 NOTE — Consult Note (Addendum)
Neurology Consultation  Reason for Consult: Subacute stroke Referring Physician: Lequita Halt, MD  CC: Subacute stroke  History is obtained from: Patient  HPI: Antonio Bowen is a 74 y.o. male with history of tobacco abuse, stroke, low back pain, hypertension, dysrhythmia, arthritis.  Patient states that he went to bed at approximately midnight on Sunday night.  When he woke up at approximately 8:52 AM on Monday morning he could not use his left arm and in addition also could not feel his left arm.  He thought that there was a possibility he had slept on it wrong.  Throughout the day it did not get better thus he was brought to the emergency department.  MRI confirmed a small acute infarct in the right corona radiata.  Small acute to subacute infarct in the right cerebellar hemisphere.  Currently patient states that he actually has more capability of using his left arm than prior.  Patient does not take aspirin as he was told not to as he is on meloxicam for his arthritis.  ED course  Relevant labs include -  CT head shows-9 mm infarct within the right cerebellum, new from prior head CT but otherwise age-indeterminate.  A lacunar infarct within the left basal ganglia/external capsule is also new from prior exam but appears chronic.  MRI brain-small acute infarct of the right corona radiata.  Smaller acute to subacute infarct in the right cerebellar hemisphere.  MRA of head-no proximal intracranial vessel occlusion or significant stenosis.    LKW: Sunday night approximately midnight tpa given?: no, out of window Premorbid modified Rankin scale (mRS): 0 NIH stroke score: 5   Past Medical History:  Diagnosis Date  . Allergic rhinitis   . Arthritis   . BOOP (bronchiolitis obliterans with organizing pneumonia) ([place])   . Bronchitis   . Colon polyps   . COPD (chronic obstructive pulmonary disease) ([place])   . Dyspnea    with exertion   . Dysrhythmia    skipped beats- followed by Dr  Angelena Form   . GERD (gastroesophageal reflux disease)   . Hypertension   . Hypothyroidism   . Kidney stone   . Low back pain   . Oxygen dependent    uses 3L/[place] at nite occasional during day if needed   . Stroke [facility])    hx of 2 strokes which patient did not know he had had   . Thyroid disease   . Tobacco dependence    Family History  Problem Relation Age of Onset  . Arthritis Father   . Stroke Father 57  . Prostate cancer Maternal Grandfather   . Stomach cancer Maternal Grandmother   . Cancer Paternal Grandmother   . Colon cancer Other        mat 1st cousin  . Cancer Mother   . Healthy Daughter   . Healthy Son   . Pancreatic cancer Neg Hx   . Esophageal cancer Neg Hx    Social History:   reports that he quit smoking about 9 years ago. His smoking use included cigarettes. He has a 50.00 pack-year smoking history. His smokeless tobacco use includes snuff. He reports current alcohol use. He reports that he does not use drugs.  Medications  Current Facility-Administered Medications:  .   stroke: mapping our early stages of recovery book, , Does not apply, Once, Wynetta Fines T, MD .  acetaminophen (TYLENOL) tablet 1,000 mg, 1,000 mg, Oral, BID PRN, Lequita Halt, MD .  albuterol (VENTOLIN HFA)  108 (90 Base) MCG/ACT inhaler 1-2 puff, 1-2 puff, Inhalation, Q4H PRN, Wynetta Fines T, MD .  atorvastatin (LIPITOR) tablet 40 mg, 40 mg, Oral, Daily, Wynetta Fines T, MD, 40 mg at 11/03/19 1452 .  clopidogrel (PLAVIX) tablet 75 mg, 75 mg, Oral, Daily, Wynetta Fines T, MD, 75 mg at 11/03/19 1453 .  fluticasone (FLONASE) 50 MCG/ACT nasal spray 1 spray, 1 spray, Each Nare, BID PRN, Wynetta Fines T, MD .  Derrill Memo ON 11/04/2019] fluticasone furoate-vilanterol (BREO ELLIPTA) 200-25 MCG/INH 1 puff, 1 puff, Inhalation, Daily, Wynetta Fines T, MD .  heparin injection 5,000 Units, 5,000 Units, Subcutaneous, Q12H, Lequita Halt, MD, 5,000 Units at 11/03/19 1443 .  hydrALAZINE (APRESOLINE) tablet 25 mg, 25 mg, Oral,  Q6H PRN, Lequita Halt, MD .  Derrill Memo ON 11/04/2019] levothyroxine (SYNTHROID) tablet 125 mcg, 125 mcg, Oral, QAC breakfast, Wynetta Fines T, MD .  loratadine (CLARITIN) tablet 10 mg, 10 mg, Oral, Daily, Zhang, Ping T, MD .  LORazepam (ATIVAN) tablet 0.5 mg, 0.5 mg, Oral, Q6H PRN, Wynetta Fines T, MD .  magnesium oxide (MAG-OX) tablet 400 mg, 400 mg, Oral, QHS, Lequita Halt, MD .  Derrill Memo ON 11/04/2019] meloxicam (MOBIC) tablet 15 mg, 15 mg, Oral, Daily, Wynetta Fines T, MD .  ondansetron [facility]) injection 4 mg, 4 mg, Intravenous, Q6H PRN, Lequita Halt, MD .  Derrill Memo ON 11/04/2019] pantoprazole (PROTONIX) EC tablet 40 mg, 40 mg, Oral, Daily, Zhang, Ping T, MD .  senna-docusate (Senokot-S) tablet 1 tablet, 1 tablet, Oral, QHS PRN, Wynetta Fines T, MD .  sodium chloride flush (NS) 0.9 % injection 3 mL, 3 mL, Intravenous, Once, Quintella Reichert, MD .  sucralfate (CARAFATE) tablet 1 g, 1 g, Oral, TID WC & HS, Lequita Halt, MD  Current Outpatient Medications:  .  acetaminophen (TYLENOL) 500 MG tablet, Take 1,000 mg by mouth 2 (two) times daily as needed (for pain.)., Disp: , Rfl:  .  albuterol (VENTOLIN HFA) 108 (90 Base) MCG/ACT inhaler, Inhale 1-2 puffs into the lungs every 4 (four) hours as needed for wheezing or shortness of breath. INHALE TWO PUFFS INTO LUNGS EVERY 6 HOURS AS NEEDED FOR WHEEZING, Disp: 18 g, Rfl: 3 .  budesonide-formoterol (SYMBICORT) 160-4.5 MCG/ACT inhaler, Inhale 2 puffs into the lungs in the morning and at bedtime. Needs to be seen for further refills., Disp: 3 Inhaler, Rfl: 3 .  cetirizine (ZYRTEC) 10 MG tablet, Take 10 mg by mouth at bedtime. , Disp: , Rfl:  .  fluticasone (FLONASE) 50 MCG/ACT nasal spray, Place 1 spray into both nostrils 2 (two) times daily as needed for allergies or rhinitis., Disp: 48 g, Rfl: 3 .  Homeopathic Products (LEG CRAMP RELIEF PO), Take 2 tablets by mouth at bedtime. [employer], Disp: , Rfl:  .  levothyroxine (SYNTHROID) 125 MCG tablet, Take 1 tablet (125 mcg  total) by mouth daily before breakfast., Disp: 90 tablet, Rfl: 3 .  losartan-hydrochlorothiazide (HYZAAR) 50-12.5 MG tablet, Take 1 tablet by mouth daily., Disp: 90 tablet, Rfl: 3 .  magnesium oxide (MAG-OX) 400 MG tablet, Take 400 mg by mouth at bedtime. , Disp: , Rfl:  .  meloxicam (MOBIC) 15 MG tablet, Take 1 tablet (15 mg total) by mouth daily., Disp: 90 tablet, Rfl: 3 .  omeprazole (PRILOSEC) 40 MG capsule, Take 1 capsule (40 mg total) by mouth 2 (two) times daily., Disp: 180 capsule, Rfl: 3 .  PRESCRIPTION MEDICATION, Oxygen 3.0 liters at night, prn use in day, Disp: , Rfl:  .  Respiratory Therapy Supplies (FLUTTER) DEVI, Use three times daily, Disp: 1 each, Rfl: 0 .  sucralfate (CARAFATE) 1 g tablet, TAKE 1 TABLET BY MOUTH 4 TIMES DAILY (WITH MEALS AND AT BEDTIME) (Patient taking differently: Take 1 g by mouth as needed (ulcers). ), Disp: 120 tablet, Rfl: 2 .  SUPER B COMPLEX/C PO, Take 1 capsule by mouth at bedtime., Disp: , Rfl:  .  traMADol (ULTRAM) 50 MG tablet, Take 1 tablet (50 mg total) by mouth every 8 (eight) hours as needed., Disp: 90 tablet, Rfl: 1 .  sucralfate (CARAFATE) 1 GM/10ML suspension, Take 10 mLs (1 g total) by mouth 4 (four) times daily -  with meals and at bedtime. (Patient not taking: Reported on 11/03/2019), Disp: 120 mL, Rfl: 0  ROS:    General ROS: negative for - chills, fatigue, fever, night sweats, weight gain or weight loss Psychological ROS: negative for - behavioral disorder, hallucinations, memory difficulties, mood swings or suicidal ideation Ophthalmic ROS: Positive for -spots in his vision ENT ROS: negative for - epistaxis, nasal discharge, oral lesions, sore throat, tinnitus or vertigo Allergy and Immunology ROS: negative for - hives or itchy/watery eyes Hematological and Lymphatic ROS: negative for - bleeding problems, bruising or swollen lymph nodes Endocrine ROS: negative for - galactorrhea, hair pattern changes, polydipsia/polyuria or temperature  intolerance Respiratory ROS: negative for - cough, hemoptysis, shortness of breath or wheezing Cardiovascular ROS: negative for - chest pain, dyspnea on exertion, edema or irregular heartbeat Gastrointestinal ROS: negative for - abdominal pain, diarrhea, hematemesis, nausea/vomiting or stool incontinence Genito-Urinary ROS: negative for - dysuria, hematuria, incontinence or urinary frequency/urgency Musculoskeletal ROS: Positive for - joint pain and weakness Neurological ROS: as noted in HPI Dermatological ROS: negative for rash and skin lesion changes  Exam: Current vital signs: BP (!) 150/116   Pulse 71   Temp 98.1 F (36.7 C)   Resp 14   SpO2 90%  Vital signs in last 24 hours: Temp:  [98.1 F (36.7 C)] 98.1 F (36.7 C) (07/06 1137) Pulse Rate:  [70-78] 71 (07/06 1213) Resp:  [11-17] 14 (07/06 1213) BP: (119-150)/(85-116) 150/116 (07/06 1153) SpO2:  [90 %-99 %] 90 % (07/06 1213)   Constitutional: Appears well-developed and well-nourished.  Psych: Affect appropriate to situation Eyes: No scleral injection HENT: No OP obstrucion Head: Normocephalic.  Cardiovascular: Normal rate and regular rhythm.  Respiratory: Effort normal, non-labored breathing GI: Soft.  No distension. There is no tenderness.  Skin: WDI  Neuro: Mental Status: Patient is awake, alert, oriented to person, place, month, year, and situation.  Patient has slight dysarthria however he has no aphasia.  He has naming, repeating and comprehension intact.  He is able to follow all commands and give a coherent history. Cranial Nerves: II: Visual Fields are full.  III,IV, VI: EOMI without ptosis or diploplia. Pupils equal, round and reactive to light V: Facial sensation is symmetric to temperature VII: Facial movement asymmetric on the left.  VIII: hearing is intact to voice X: Palat elevates symmetrically XI: Shoulder shrug is symmetric. XII: tongue is midline without atrophy or fasciculations.  Motor: Left  arm has 2/5 strength with drift to the bed otherwise right arm, right leg and left leg have 5/5 strength Sensory: Decreased sensation on left arm otherwise intact Deep Tendon Reflexes: 2+ and symmetric in the biceps and patellae.  Plantars: Toes are downgoing bilaterally.  Cerebellar: Finger-nose-finger is intact on the right and heel-to-shin does not show any dysmetria on right or left  CBC    Component Value Date/Time   WBC 10.9 (H) 11/03/2019 1140   RBC 5.42 11/03/2019 1140   HGB 17.0 11/03/2019 1157   HGB 16.6 09/09/2019 1352   HCT 50.0 11/03/2019 1157   HCT 47.8 09/09/2019 1352   PLT 195 11/03/2019 1140   PLT 190 09/09/2019 1352   MCV 94.8 11/03/2019 1140   MCV 91 09/09/2019 1352   MCH 32.3 11/03/2019 1140   MCHC 34.0 11/03/2019 1140   RDW 13.2 11/03/2019 1140   RDW 12.8 09/09/2019 1352   LYMPHSABS 4.1 (H) 11/03/2019 1140   LYMPHSABS 3.1 09/09/2019 1352   MONOABS 0.6 11/03/2019 1140   EOSABS 0.1 11/03/2019 1140   EOSABS 0.1 09/09/2019 1352   BASOSABS 0.1 11/03/2019 1140   BASOSABS 0.0 09/09/2019 1352    CMP     Component Value Date/Time   NA 141 11/03/2019 1157   NA 142 09/09/2019 1352   K 3.4 (L) 11/03/2019 1157   CL 101 11/03/2019 1157   CO2 25 11/03/2019 1140   GLUCOSE 114 (H) 11/03/2019 1157   BUN 15 11/03/2019 1157   BUN 15 09/09/2019 1352   CREATININE 1.70 (H) 11/03/2019 1157   CALCIUM 9.4 11/03/2019 1140   PROT 7.2 11/03/2019 1140   PROT 6.6 09/09/2019 1352   ALBUMIN 4.1 11/03/2019 1140   ALBUMIN 4.4 09/09/2019 1352   AST 25 11/03/2019 1140   ALT 27 11/03/2019 1140   ALKPHOS 64 11/03/2019 1140   BILITOT 0.8 11/03/2019 1140   BILITOT 0.5 09/09/2019 1352   GFRNONAA 39 (L) 11/03/2019 1140   GFRAA 45 (L) 11/03/2019 1140    Lipid Panel     Component Value Date/Time   CHOL 99 11/03/2019 1140   CHOL 104 09/09/2019 1352   TRIG 58 11/03/2019 1140   TRIG 79 05/06/2013 1130   HDL 52 11/03/2019 1140   HDL 64 09/09/2019 1352   HDL 54  05/06/2013 1130   CHOLHDL 1.9 11/03/2019 1140   VLDL 12 11/03/2019 1140   LDLCALC 35 11/03/2019 1140   LDLCALC 21 09/09/2019 1352   LDLCALC 34 05/06/2013 1130     Imaging I have reviewed the images obtained:  CT head shows-9 mm infarct within the right cerebellum, new from prior head CT but otherwise age-indeterminate.  A lacunar infarct within the left basal ganglia/external capsule is also new from prior exam but appears chronic.  MRI brain-small acute infarct of the right corona radiata.  Smaller acute to subacute infarct in the right cerebellar hemisphere.  MRA of head-no proximal intracranial vessel occlusion or significant stenosis.  Etta Quill PA-C [facility] [phone]  M-F  (9:00 am- 5:00 PM)  11/03/2019, 5:51 PM   I have seen the patient and reviewed the above note.   Assessment:  74 year old male presenting to the hospital with subacute right corona radiata and right cerebellar hemisphere stroke.  On exam patient has 2/5 strength in the right arm with significant drift.  In addition he has decreased sensation.  Patient is on no blood thinners.  The multifocal nature does raise the question of embolic disease, though I do think concurrent small vessel disease is also a possibility.  Impression: -Stroke  Recommend  -Transthoracic Echo -Start patient on ASA 325mg  daily  -Start or continue Atorvastatin 80 mg/other high intensity statin -BP goal: At this point given patient has approximately 2 days to 3 days out would keep systolic blood pressure less than 140 -HBAIC and Lipid profile -Telemetry monitoring -Frequent neuro checks -NPO  until passes stroke swallow screen -PT/OT # please page stroke NP  Or  PA  Or MD from 8am -4 pm  as this patient from this time will be  followed by the stroke.   You can look them up on [email or web address]  Password [email or web address]   Roland Rack, MD [facility] [phone]  If 7pm- 7am, please page neurology on  call as listed in [place].

## 2019-11-04 ENCOUNTER — Encounter (HOSPITAL_COMMUNITY): Payer: Self-pay | Admitting: Internal Medicine

## 2019-11-04 ENCOUNTER — Inpatient Hospital Stay (HOSPITAL_BASED_OUTPATIENT_CLINIC_OR_DEPARTMENT_OTHER): Payer: Medicare HMO

## 2019-11-04 ENCOUNTER — Other Ambulatory Visit: Payer: Self-pay

## 2019-11-04 ENCOUNTER — Observation Stay (HOSPITAL_COMMUNITY): Payer: Medicare HMO

## 2019-11-04 DIAGNOSIS — I639 Cerebral infarction, unspecified: Secondary | ICD-10-CM

## 2019-11-04 DIAGNOSIS — I6389 Other cerebral infarction: Secondary | ICD-10-CM | POA: Diagnosis not present

## 2019-11-04 DIAGNOSIS — R531 Weakness: Secondary | ICD-10-CM | POA: Diagnosis not present

## 2019-11-04 LAB — HEMOGLOBIN A1C
Hgb A1c MFr Bld: 5.7 % — ABNORMAL HIGH (ref 4.8–5.6)
Mean Plasma Glucose: 116.89 mg/dL

## 2019-11-04 LAB — ECHOCARDIOGRAM COMPLETE
Height: 69 in
Weight: 3273.39 oz

## 2019-11-04 MED ORDER — PERFLUTREN LIPID MICROSPHERE
1.0000 mL | INTRAVENOUS | Status: AC | PRN
  Administered 2019-11-04: 3 mL via INTRAVENOUS
  Filled 2019-11-04: qty 10

## 2019-11-04 MED ORDER — PANTOPRAZOLE SODIUM 40 MG PO TBEC
40.0000 mg | DELAYED_RELEASE_TABLET | Freq: Every day | ORAL | Status: DC
  Administered 2019-11-05: 40 mg via ORAL
  Filled 2019-11-04: qty 1

## 2019-11-04 MED ORDER — LORAZEPAM 0.5 MG PO TABS
0.5000 mg | ORAL_TABLET | Freq: Four times a day (QID) | ORAL | Status: DC | PRN
  Administered 2019-11-04: 0.5 mg via ORAL
  Filled 2019-11-04: qty 1

## 2019-11-04 NOTE — Progress Notes (Signed)
PROGRESS NOTE    Antonio Bowen  BJS:[id number] DOB: 01/25/1946 DOA: 11/03/2019 PCP: [name], [name], MD   Brief Narrative:  74 y.o. male with medical history significant of remote stroke not tolerating aspirin for severe hemoptysis status post bronchoscopy, severe multijoint OA on Mobic not tolerating narcotic or other NSAIDs, retention, hypothyroidism, COPD, GERD presented with new onset of left arm weakness and unsteady gait. CT head: 9 mm infarct within the right cerebellum, new from prior head CT 02/18/2015 but otherwise age indeterminate. A lacunar infarct within the left basal ganglia/external capsule is also new from this prior exam but appears chronic.  Neurology team consulted.  LDL 35, A1c 5.7.   Assessment & Plan:   Principal Problem:   Acute CVA (cerebrovascular accident) ([place]) Active Problems:   Hypertension   GERD (gastroesophageal reflux disease)   Hypothyroidism   Hyperlipidemia LDL goal <130   Chronic kidney disease (CKD), stage III (moderate)   Esophageal stricture  Acute right cerebellar infarct -CT head shows 9 mm right cerebellar infarct, lacunar infarct with left basal ganglia. -MRI brain/MRA head-small acute right corona radiata infarct, subacute right cerebellar infarct.  MRA head-negative -A1c 5.7, LDL 35 -Echocardiogram-telemetry -EEG-pending -Neurology recommending loop recorder, orders placed. -Likely aspirin Plavix for 3 weeks followed by aspirin.  Essential hypertension -Allow permissive hypertension for today  Severe OA -As above  CKD stage IIIa; baseline creatinine 1.7. -Creatinine level stable, patient is euvolemic  Chronic dysphagia -Has history of esophageal stenosis shown on most recent EGD in May 2021, has signs of esophagitis as well.  Continue PPI and Sucralfate.  COPD -Stable  DVT prophylaxis: Subcu heparin Code Status: Full code Family Communication: Wife at bedside  Status is: Inpatient  Remains inpatient  appropriate because:Inpatient level of care appropriate due to severity of illness   Dispo: The patient is from: Home              Anticipated d/c is to: Home              Anticipated d/c date is: 1 day              Patient currently is not medically stable to d/c.  Still undergoing evaluation for his CVA, pending echocardiogram, EEG, loop recorder placement.  Thereafter PT/OT evaluation.   Body mass index is 30.21 kg/m.    Consultants:   Neurology  Subjective: Feels okay this morning no new complaints.  Review of Systems Otherwise negative except as per HPI, including: General: Denies fever, chills, night sweats or unintended weight loss. Resp: Denies cough, wheezing, shortness of breath. Cardiac: Denies chest pain, palpitations, orthopnea, paroxysmal nocturnal dyspnea. GI: Denies abdominal pain, nausea, vomiting, diarrhea or constipation GU: Denies dysuria, frequency, hesitancy or incontinence MS: Denies muscle aches, joint pain or swelling Neuro: Denies headache, neurologic deficits (focal weakness, numbness, tingling), abnormal gait Psych: Denies anxiety, depression, SI/HI/AVH Skin: Denies new rashes or lesions ID: Denies sick contacts, exotic exposures, travel  Examination:  General exam: Appears calm and comfortable  Respiratory system: Clear to auscultation. Respiratory effort normal. Cardiovascular system: S1 & S2 heard, RRR. No JVD, murmurs, rubs, gallops or clicks. No pedal edema. Gastrointestinal system: Abdomen is nondistended, soft and nontender. No organomegaly or masses felt. Normal bowel sounds heard. Central nervous system: Alert and oriented. No focal neurological deficits. Extremities: Symmetric 5 x 5 power. Skin: No rashes, lesions or ulcers Psychiatry: Judgement and insight appear normal. Mood & affect appropriate.     Objective: Vitals:   11/04/19 0241  11/04/19 0445 11/04/19 0811 11/04/19 0921  BP: (!) 138/91 (!) 134/100 (!) 158/93   Pulse: 83  78 83   Resp: 20 18 17    Temp: 98 F (36.7 C) 98 F (36.7 C) 98.1 F (36.7 C)   TempSrc: Oral Oral Oral   SpO2: 99% 100% 99% 96%  Weight: 92.8 kg     Height: 5\' 9"  (1.753 m)       Intake/Output Summary (Last 24 hours) at 11/04/2019 1153 Last data filed at 11/03/2019 1833 Gross per 24 hour  Intake 3 ml  Output --  Net 3 ml   Filed Weights   11/04/19 0241  Weight: 92.8 kg     Data Reviewed:   CBC: Recent Labs  Lab 11/03/19 1140 11/03/19 1157  WBC 10.9*  --   NEUTROABS 6.0  --   HGB 17.5* 17.0  HCT 51.4 50.0  MCV 94.8  --   PLT 195  --    Basic Metabolic Panel: Recent Labs  Lab 11/03/19 1140 11/03/19 1157  NA 138 141  K 3.4* 3.4*  CL 101 101  CO2 25  --   GLUCOSE 117* 114*  BUN 12 15  CREATININE 1.71* 1.70*  CALCIUM 9.4  --    GFR: Estimated Creatinine Clearance: 42.9 mL/min (A) (by C-G formula based on SCr of 1.7 mg/dL (H)). Liver Function Tests: Recent Labs  Lab 11/03/19 1140  AST 25  ALT 27  ALKPHOS 64  BILITOT 0.8  PROT 7.2  ALBUMIN 4.1   No results for input(s): LIPASE, AMYLASE in the last 168 hours. No results for input(s): AMMONIA in the last 168 hours. Coagulation Profile: Recent Labs  Lab 11/03/19 1140  INR 0.9   Cardiac Enzymes: No results for input(s): CKTOTAL, CKMB, CKMBINDEX, TROPONINI in the last 168 hours. BNP (last 3 results) No results for input(s): PROBNP in the last 8760 hours. HbA1C: Recent Labs    11/04/19 0424  HGBA1C 5.7*   CBG: Recent Labs  Lab 11/03/19 1232  GLUCAP 105*   Lipid Profile: Recent Labs    11/03/19 1140  CHOL 99  HDL 52  LDLCALC 35  TRIG 58  CHOLHDL 1.9   Thyroid Function Tests: No results for input(s): TSH, T4TOTAL, FREET4, T3FREE, THYROIDAB in the last 72 hours. Anemia Panel: No results for input(s): VITAMINB12, FOLATE, FERRITIN, TIBC, IRON, RETICCTPCT in the last 72 hours. Sepsis Labs: No results for input(s): PROCALCITON, LATICACIDVEN in the last 168 hours.  Recent Results  (from the past 240 hour(s))  SARS Coronavirus 2 by RT PCR (hospital order, performed in [facility] hospital lab) Nasopharyngeal Nasopharyngeal Swab     Status: None   Collection Time: 11/03/19  2:35 PM   Specimen: Nasopharyngeal Swab  Result Value Ref Range Status   SARS Coronavirus 2 NEGATIVE NEGATIVE Final    Comment: (NOTE) SARS-CoV-2 target nucleic acids are NOT DETECTED.  The SARS-CoV-2 RNA is generally detectable in upper and lower respiratory specimens during the acute phase of infection. The lowest concentration of SARS-CoV-2 viral copies this assay can detect is 250 copies / mL. A negative result does not preclude SARS-CoV-2 infection and should not be used as the sole basis for treatment or other patient management decisions.  A negative result may occur with improper specimen collection / handling, submission of specimen other than nasopharyngeal swab, presence of viral mutation(s) within the areas targeted by this assay, and inadequate number of viral copies (<250 copies / mL). A negative result must be combined with  clinical observations, patient history, and epidemiological information.  Fact Sheet for Patients:   StrictlyIdeas.no  Fact Sheet for Healthcare Providers: BankingDealers.co.za  This test is not yet approved or  cleared by the [place] FDA and has been authorized for detection and/or diagnosis of SARS-CoV-2 by FDA under an Emergency Use Authorization (EUA).  This EUA will remain in effect (meaning this test can be used) for the duration of the COVID-19 declaration under Section 564(b)(1) of the Act, 21 U.S.C. section 360bbb-3(b)(1), unless the authorization is terminated or revoked sooner.  Performed at [facility], [place] [address] [place],  [address]          Radiology Studies: CT HEAD WO CONTRAST  Result Date: 11/03/2019 CLINICAL DATA:  Possible stroke; neuro deficit, acute,  stroke suspected; head trauma, focal neuro findings. Additional history provided: Unable to move left arm, tingling in left thigh and left face. EXAM: CT HEAD WITHOUT CONTRAST TECHNIQUE: Contiguous axial images were obtained from the base of the skull through the vertex without intravenous contrast. COMPARISON:  Noncontrast head CT 02/18/2015 FINDINGS: Brain: Mild generalized parenchymal atrophy. Redemonstrated small chronic lacunar infarct within the right caudate nucleus. A chronic lacunar infarct within the left lentiform nucleus/external capsule is well demarcated and appears chronic, although is new as compared to prior head CT 02/18/2015. A 9 mm infarct within the right cerebellum was not present on the prior CT and is age indeterminate. Redemonstrated small chronic infarct within the left cerebellum. Background mild ill-defined hypoattenuation within the cerebral white matter is nonspecific, but consistent with chronic small vessel ischemic disease. There is no acute intracranial hemorrhage. No acute demarcated cortical infarct is identified. No extra-axial fluid collection. No evidence of intracranial mass. No midline shift. Vascular: No hyperdense vessel.  Atherosclerotic calcifications Skull: Normal. Negative for fracture or focal lesion. Sinuses/Orbits: Prior left maxillofacial reconstructive surgery. Visualized orbits show no acute finding. Mild ethmoid sinus mucosal thickening. No significant mastoid effusion. IMPRESSION: 9 mm infarct within the right cerebellum, new from prior head CT 02/18/2015 but otherwise age indeterminate. A lacunar infarct within the left basal ganglia/external capsule is also new from this prior exam but appears chronic. Redemonstrated small chronic infarcts within the right caudate nucleus and left cerebellum. Background mild generalized parenchymal atrophy and chronic small vessel ischemic disease. Mild ethmoid sinus mucosal thickening. Electronically Signed   By: Kellie Simmering  DO   On: 11/03/2019 12:14   MR ANGIO HEAD WO CONTRAST  Result Date: 11/03/2019 CLINICAL DATA:  Abnormal CT, stroke suspected EXAM: MRI HEAD WITHOUT CONTRAST MRA HEAD WITHOUT CONTRAST TECHNIQUE: Multiplanar, multiecho pulse sequences of the brain and surrounding structures were obtained without intravenous contrast. Angiographic images of the head were obtained using MRA technique without contrast. COMPARISON:  None. FINDINGS: MRI HEAD Brain: There is a subcentimeter focus of minimally reduced diffusion in the right cerebellar hemisphere. Additional 12 mm focus of reduced diffusion in the right corona radiata. There are small chronic infarcts of the right caudate, left corona radiata, and left cerebellum. Additional patchy T2 hyperintensity in the supratentorial and pontine white matter is nonspecific but may reflect mild to moderate chronic microvascular ischemic changes. Prominence of the ventricles and sulci reflects minor generalized parenchymal volume loss. There is no intracranial mass, mass effect, or edema. There is no hydrocephalus or extra-axial fluid collection. Vascular: Major vessel flow voids at the skull base are preserved. Skull and upper cervical spine: Normal marrow signal is preserved. Sinuses/Orbits: Paranasal sinuses are aerated. Right lens replacement. Other:  Sella is unremarkable.  Mastoid air cells are clear. MRA HEAD Intracranial internal carotid arteries are patent. Middle and anterior cerebral arteries are patent. Intracranial vertebral arteries, basilar artery, posterior cerebral arteries are patent. Bilateral posterior communicating arteries are present with fetal or near fetal origin of the posterior cerebral arteries. There is no significant stenosis or aneurysm. IMPRESSION: Small acute infarct of the right corona radiata. Smaller acute to subacute infarct of the right cerebellar hemisphere. Mild to moderate chronic microvascular ischemic changes. Few chronic small infarcts as  detailed above. No proximal intracranial vessel occlusion or significant stenosis. Electronically Signed   By: Macy Mis M.D.   On: 11/03/2019 16:00   MR BRAIN WO CONTRAST  Result Date: 11/03/2019 CLINICAL DATA:  Abnormal CT, stroke suspected EXAM: MRI HEAD WITHOUT CONTRAST MRA HEAD WITHOUT CONTRAST TECHNIQUE: Multiplanar, multiecho pulse sequences of the brain and surrounding structures were obtained without intravenous contrast. Angiographic images of the head were obtained using MRA technique without contrast. COMPARISON:  None. FINDINGS: MRI HEAD Brain: There is a subcentimeter focus of minimally reduced diffusion in the right cerebellar hemisphere. Additional 12 mm focus of reduced diffusion in the right corona radiata. There are small chronic infarcts of the right caudate, left corona radiata, and left cerebellum. Additional patchy T2 hyperintensity in the supratentorial and pontine white matter is nonspecific but may reflect mild to moderate chronic microvascular ischemic changes. Prominence of the ventricles and sulci reflects minor generalized parenchymal volume loss. There is no intracranial mass, mass effect, or edema. There is no hydrocephalus or extra-axial fluid collection. Vascular: Major vessel flow voids at the skull base are preserved. Skull and upper cervical spine: Normal marrow signal is preserved. Sinuses/Orbits: Paranasal sinuses are aerated. Right lens replacement. Other: Sella is unremarkable.  Mastoid air cells are clear. MRA HEAD Intracranial internal carotid arteries are patent. Middle and anterior cerebral arteries are patent. Intracranial vertebral arteries, basilar artery, posterior cerebral arteries are patent. Bilateral posterior communicating arteries are present with fetal or near fetal origin of the posterior cerebral arteries. There is no significant stenosis or aneurysm. IMPRESSION: Small acute infarct of the right corona radiata. Smaller acute to subacute infarct of the  right cerebellar hemisphere. Mild to moderate chronic microvascular ischemic changes. Few chronic small infarcts as detailed above. No proximal intracranial vessel occlusion or significant stenosis. Electronically Signed   By: Macy Mis M.D.   On: 11/03/2019 16:00   VAS US CAROTID  Result Date: 11/04/2019 Carotid Arterial Duplex Study Indications:       Carotid stenosis. Risk Factors:      Hypertension. Comparison Study:  no prior Performing Technologist: Abram Sander RVS  Examination Guidelines: A complete evaluation includes B-mode imaging, spectral Doppler, color Doppler, and power Doppler as needed of all accessible portions of each vessel. Bilateral testing is considered an integral part of a complete examination. Limited examinations for reoccurring indications may be performed as noted.  Right Carotid Findings: +----------+--------+--------+--------+------------------+--------+           PSV cm/sEDV cm/sStenosisPlaque DescriptionComments +----------+--------+--------+--------+------------------+--------+ CCA Prox  62      17              heterogenous               +----------+--------+--------+--------+------------------+--------+ CCA Distal68      19              heterogenous               +----------+--------+--------+--------+------------------+--------+ ICA Prox  46  14      1-39%   heterogenous               +----------+--------+--------+--------+------------------+--------+ ICA Distal95      30                                         +----------+--------+--------+--------+------------------+--------+ ECA       69      17                                         +----------+--------+--------+--------+------------------+--------+ +----------+--------+-------+--------+-------------------+           PSV cm/sEDV cmsDescribeArm Pressure (mmHG) +----------+--------+-------+--------+-------------------+ [id number]                                          +----------+--------+-------+--------+-------------------+ +---------+--------+--------+------------+ VertebralPSV cm/sEDV cm/sNot assessed +---------+--------+--------+------------+  Left Carotid Findings: +----------+--------+--------+--------+------------------+--------+           PSV cm/sEDV cm/sStenosisPlaque DescriptionComments +----------+--------+--------+--------+------------------+--------+ CCA Prox  61      21              heterogenous               +----------+--------+--------+--------+------------------+--------+ CCA Distal52      14              heterogenous               +----------+--------+--------+--------+------------------+--------+ ICA Prox  46      22      1-39%   heterogenous               +----------+--------+--------+--------+------------------+--------+ ICA Distal54      19                                         +----------+--------+--------+--------+------------------+--------+ ECA       65      21                                         +----------+--------+--------+--------+------------------+--------+ +----------+--------+--------+--------+-------------------+           PSV cm/sEDV cm/sDescribeArm Pressure (mmHG) +----------+--------+--------+--------+-------------------+ [id number]                                         +----------+--------+--------+--------+-------------------+ +---------+--------+--------+------------+ VertebralPSV cm/sEDV cm/sNot assessed +---------+--------+--------+------------+   Summary: Right Carotid: Velocities in the right ICA are consistent with a 1-39% stenosis. Left Carotid: Velocities in the left ICA are consistent with a 1-39% stenosis. Vertebrals: Bilateral vertebral arteries were not visualized. *See table(s) above for measurements and observations.     Preliminary         Scheduled Meds: .  stroke: mapping our early stages of recovery book   Does not apply Once  .  atorvastatin  40 mg Oral Daily  . clopidogrel  75 mg Oral Daily  . fluticasone furoate-vilanterol  1 puff Inhalation Daily  . heparin  5,000 Units  Subcutaneous Q12H  . levothyroxine  125 mcg Oral QAC breakfast  . loratadine  10 mg Oral Daily  . magnesium oxide  400 mg Oral QHS  . meloxicam  15 mg Oral Daily  . [START ON 11/05/2019] pantoprazole  40 mg Oral QAC breakfast  . sucralfate  1 g Oral TID WC & HS   Continuous Infusions:   LOS: 1 day   Time spent= 35 mins    Rahma Meller Arsenio Loader, MD [facility]  If 7PM-7AM, please contact night-coverage  11/04/2019, 11:53 AM

## 2019-11-04 NOTE — Procedures (Signed)
ELECTROENCEPHALOGRAM REPORT   Patient: Antonio Bowen       Room #: 3E94M EEG No. ID: [id number] Age: 74 y.o.        Sex: male Requesting Physician: Reesa Chew Report Date:  11/04/2019        Interpreting Physician: Alexis Goodell  History: [name] is an 74 y.o. male with LUE weakness and difficulty with gait  Medications:  Plavix, Synthroid, Mobic, Lipitor  Conditions of Recording:  This is a 21 channel routine scalp EEG performed with bipolar and monopolar montages arranged in accordance to the international 10/20 system of electrode placement. One channel was dedicated to EKG recording.  The patient is in the awake and drowsy states.  Description:  The waking background activity consists of a low voltage, symmetrical, fairly well organized, 8-9 Hz alpha activity, seen from the parieto-occipital and posterior temporal regions.  Low voltage fast activity, poorly organized, is seen anteriorly and is at times superimposed on more posterior regions.  A mixture of theta and alpha rhythms are seen from the central and temporal regions. The patient drowses with slowing to irregular, low voltage theta and beta activity.   Stage II sleep is not obtained. No epileptiform activity is noted.   Hyperventilation was not performed.  Intermittent photic stimulation was performed but failed to illicit any change in the tracing.    IMPRESSION: Normal electroencephalogram, awake, drowsy and with activation procedures. There are no focal lateralizing or epileptiform features.   Alexis Goodell, MD Neurology [phone] 11/04/2019, 5:48 PM

## 2019-11-04 NOTE — Progress Notes (Signed)
Received on cart from ED into room.  Oriented to room.  MD paged to notify of admission to the floor.  Is calm and cooperative at this time.  Antonio Bowen he just gets anxious sometimes and he wants to be home with his wife.  Said the medication he received in the ED made him calm and sleepy.  Resting comfortably at this time.

## 2019-11-04 NOTE — Evaluation (Signed)
Speech Language Pathology Evaluation Patient Details Name: Antonio Bowen MRN: [id number] DOB: 04-08-1946 Today's Date: 11/04/2019 Time: 2297-9892 SLP Time Calculation (min) (ACUTE ONLY): 21 min  Problem List:  Patient Active Problem List   Diagnosis Date Noted  . Acute CVA (cerebrovascular accident) ([place]) 11/03/2019  . Esophageal stricture   . Chronic kidney disease (CKD), stage III (moderate) 09/12/2018  . Lateral epicondylitis 07/03/2017  . Pain in joint of left elbow 06/05/2017  . Dysphagia 02/23/2016  . Schatzki's ring 02/23/2016  . Oxygen dependent 02/23/2016  . Hyperlipidemia LDL goal <130 06/01/2015  . BOOP (bronchiolitis obliterans with organizing pneumonia) ([place]) 09/29/2010  . Hearing loss, conductive, bilateral 09/21/2010  . Hypothyroidism 09/21/2010  . Obstructive chronic bronchitis with exacerbation Gold B Copd   . Hypertension   . GERD (gastroesophageal reflux disease)   . Low back pain    Past Medical History:  Past Medical History:  Diagnosis Date  . Allergic rhinitis   . Arthritis   . BOOP (bronchiolitis obliterans with organizing pneumonia) ([place])   . Bronchitis   . Colon polyps   . COPD (chronic obstructive pulmonary disease) ([place])   . Dyspnea    with exertion   . Dysrhythmia    skipped beats- followed by Dr Angelena Form   . GERD (gastroesophageal reflux disease)   . Hypertension   . Hypothyroidism   . Kidney stone   . Low back pain   . Oxygen dependent    uses 3L/[place] at nite occasional during day if needed   . Stroke [facility])    hx of 2 strokes which patient did not know he had had   . Thyroid disease   . Tobacco dependence    Past Surgical History:  Past Surgical History:  Procedure Laterality Date  . BIOPSY  08/18/2019   Procedure: BIOPSY;  Surgeon: Irene Shipper, MD;  Location: [facility];  Service: Endoscopy;;  . broncoscopy     . carpel tunnal     left  . COLONOSCOPY     x 4  . DUPUYTREN CONTRACTURE RELEASE     Left  .  ESOPHAGOGASTRODUODENOSCOPY (EGD) WITH PROPOFOL N/A 03/26/2016   Procedure: ESOPHAGOGASTRODUODENOSCOPY (EGD) WITH PROPOFOL;  Surgeon: Irene Shipper, MD;  Location: [facility] ENDOSCOPY;  Service: Endoscopy;  Laterality: N/A;  . ESOPHAGOGASTRODUODENOSCOPY (EGD) WITH PROPOFOL N/A 08/18/2019   Procedure: ESOPHAGOGASTRODUODENOSCOPY (EGD) WITH PROPOFOL;  Surgeon: Irene Shipper, MD;  Location: [facility] ENDOSCOPY;  Service: Endoscopy;  Laterality: N/A;  Possible dilation  . ESOPHAGOGASTRODUODENOSCOPY ENDOSCOPY     x 3  . FACIAL FRACTURE SURGERY    . OTHER SURGICAL HISTORY     facial surgery  . SAVORY DILATION N/A 08/18/2019   Procedure: SAVORY DILATION;  Surgeon: Irene Shipper, MD;  Location: [facility];  Service: Endoscopy;  Laterality: N/A;  . TOTAL HIP ARTHROPLASTY  01/2011   Right hip, [place]  . ULNAR NERVE TRANSPOSITION     left   HPI:  Pt is a 74 y.o. male with medical history significant of remote stroke not tolerating aspirin for severe hemoptysis status post bronchoscopy, severe multijoint OA hypothyroidism, COPD, GERD presented with new onset of left arm weakness and unsteady gait. MRI brain: Small acute infarct of the right corona radiata. Smaller acute to subacute infarct of the right cerebellar hemisphere.   Assessment / Plan / Recommendation Clinical Impression  Pt participated in speech/language/cognition evaluation evaluation with his son and wife present. Pt denied any baseline deficits in speech/language/cognition. He has an Marine scientist  and was living independently prior to admission. Pt and his family stated that the pt's speech was initially "slurry" but is now 95-100% back to baseline. [facility] Mental Status Examination was completed to evaluate the pt's cognitive-linguistic skills. He achieved a score of 26/30 which is within the normal limits of 25 or more out of 30. Mild articulatory imprecision was noted, but it did not significantly impact speech  intelligibility. Further skilled SLP services are not clinically indicated at this time. Pt and family were educated regarding this and all parties verbalized understanding as well as agreement with plan of care.    SLP Assessment  SLP Recommendation/Assessment: Patient does not need any further Speech Lanaguage Pathology Services SLP Visit Diagnosis: Cognitive communication deficit (R41.841)    Follow Up Recommendations  None    Frequency and Duration           SLP Evaluation Cognition  Overall Cognitive Status: Within Functional Limits for tasks assessed Arousal/Alertness: Awake/alert Orientation Level: Oriented X4 Attention: Focused;Sustained Focused Attention: Appears intact Sustained Attention: Appears intact Memory: Impaired Memory Impairment: Storage deficit;Retrieval deficit (Immediate: 5/5; delayed: 4/5; wityh cue: 1/2) Awareness: Appears intact Problem Solving: Appears intact Executive Function: Reasoning;Sequencing;Organizing Reasoning: Appears intact Sequencing: Appears intact (Clock drawing: 3/3) Organizing: Appears intact (Backward digit span: 3/3)       Comprehension  Auditory Comprehension Overall Auditory Comprehension: Appears within functional limits for tasks assessed Yes/No Questions: Within Functional Limits Commands: Within Functional Limits    Expression Expression Primary Mode of Expression: Verbal Verbal Expression Overall Verbal Expression: Appears within functional limits for tasks assessed Initiation: No impairment Level of Generative/Spontaneous Verbalization: Conversation   Oral / Motor  Oral Motor/Sensory Function Overall Oral Motor/Sensory Function: Within functional limits Motor Speech Overall Motor Speech: Impaired at baseline Respiration: Within functional limits Phonation: Normal Resonance: Within functional limits Articulation: Impaired Level of Impairment: Conversation Intelligibility: Intelligible Motor Planning: Witnin  functional limits Motor Speech Errors: Aware;Consistent   Donterrius Santucci I. Hardin Negus, [place], [place] Office number [phone] Pager [place] 11/04/2019, 11:35 AM

## 2019-11-04 NOTE — Care Management Obs Status (Signed)
[place] NOTIFICATION   Patient Details  Name: LARWENCE TU MRN: [id number] Date of Birth: 1945/11/11   Medicare Observation Status Notification Given:  Yes    Pollie Friar, RN 11/04/2019, 4:06 PM

## 2019-11-04 NOTE — Plan of Care (Signed)
  Problem: Education: Goal: Knowledge of General Education information will improve Description Including pain rating scale, medication(s)/side effects and non-pharmacologic comfort measures Outcome: Progressing   Problem: Nutrition: Goal: Adequate nutrition will be maintained Outcome: Progressing   Problem: Pain Managment: Goal: General experience of comfort will improve Outcome: Progressing   

## 2019-11-04 NOTE — Progress Notes (Signed)
  Echocardiogram 2D Echocardiogram has been performed.  Antonio Bowen 11/04/2019, 3:45 PM

## 2019-11-04 NOTE — ED Notes (Signed)
PT extremely anxious and continually taking of b/p cuffs and leads. PT was counseled as to the necessity of keeping the leads on. MD Shaloub paged for medication to reduce anxiousness.

## 2019-11-04 NOTE — Progress Notes (Addendum)
Physical Therapy Treatment Patient Details Name: Antonio Bowen MRN: [id number] DOB: [birth year] Today's Date: 11/04/2019    History of Present Illness Pt is a 74 y.o. male with PMH of HTN, COPD, BOOP, GERD, Schatzki's ring, hypothyroidism, hearing loss, HLD, CKD, and degeneration of lumbar intervertebral disc, presenting 11/03/19 with numbeness and weakness on L side. MRI reveals small acute infarct of the R corona radiata; smaller acute to subacute infarct in R cerebellar hemisphere also seen.   PT Comments    Pt seen for additional session, as pt declined follow-up outpatient PT services, pt and family requesting HEP before d/c tomorrow. Therex education and demonstration provided, as well as HEP handout. Family supportive and pt motivated to return to playing golf. Functional, sport-related activity incorporated as well. If to remain admitted, will continue to follow acutely.    Follow Up Recommendations  Outpatient PT (pt declined)     Equipment Recommendations  None recommended by PT    Recommendations for Other Services       Precautions / Restrictions Precautions Precautions: Fall Precaution Comments: O2 dependent at night Restrictions Weight Bearing Restrictions: No    Mobility  Bed Mobility Overal bed mobility: Modified Independent             General bed mobility comments: Sitting EOB  Transfers Overall transfer level: Needs assistance Equipment used: None Transfers: Sit to/from Stand Sit to Stand: Supervision         General transfer comment: Min guard for safety  Ambulation/Gait                 Stairs             Wheelchair Mobility    Modified Rankin (Stroke Patients Only)       Balance Overall balance assessment: Needs assistance Sitting-balance support: Feet supported;No upper extremity supported Sitting balance-Leahy Scale: Good     Standing balance support: No upper extremity supported;During functional  activity Standing balance-Leahy Scale: Fair Standing balance comment: noted unsteadiness while grooming at sink                            Cognition Arousal/Alertness: Awake/alert Behavior During Therapy: WFL for tasks assessed/performed Overall Cognitive Status: Within Functional Limits for tasks assessed                                 General Comments: Pt feeling anxious and somewhat impulsive - but believe it is part of his personality from him making jokes and pretending to have wobbly knees when getting up from bed.       Exercises Other Exercises Other Exercises: [employer] HEP handout provided (Access Code [email or web address]); also discussed functional ways to incorporate LUE as much as possible, as well as sport-specific tasks as pt hopeful to return to golfing (i.e. gripping, putting, swinging, etc. for [employer] with RUE assisted as needed)    General Comments General comments (skin integrity, edema, etc.): Wife and son present and supportive. Session focused on therex education and HEP as pt declining follow-up PT/OT services      Pertinent Vitals/Pain Pain Assessment: Faces Faces Pain Scale: No hurt Pain Intervention(s): Monitored during session    Home Living Family/patient expects to be discharged to:: Private residence Living Arrangements: Spouse/significant other Available Help at Discharge: Family;Available 24 hours/day Type of Home: House Home Access: Level entry   Home Layout: One level Home Equipment:  Walker - 2 wheels;Cane - single point;Shower seat      Prior Function Level of Independence: Independent          PT Goals (current goals can now be found in the care plan section) Acute Rehab PT Goals Patient Stated Goal: Get back to playing golf Progress towards PT goals: Progressing toward goals    Frequency    Min 4X/week      PT Plan Current plan remains appropriate    Co-evaluation              AM-PAC PT "6 Clicks"  Mobility   Outcome Measure  Help needed turning from your back to your side while in a flat bed without using bedrails?: None Help needed moving from lying on your back to sitting on the side of a flat bed without using bedrails?: None Help needed moving to and from a bed to a chair (including a wheelchair)?: None Help needed standing up from a chair using your arms (e.g., wheelchair or bedside chair)?: None Help needed to walk in hospital room?: None Help needed climbing 3-5 steps with a railing? : A Little 6 Click Score: 23    End of Session   Activity Tolerance: Patient tolerated treatment well Patient left: in bed;with call bell/phone within reach;with family/visitor present Nurse Communication: Mobility status PT Visit Diagnosis: Hemiplegia and hemiparesis Hemiplegia - Right/Left: Left Hemiplegia - dominant/non-dominant: Non-dominant Hemiplegia - caused by: Cerebral infarction     Time: 1705-1720 PT Time Calculation (min) (ACUTE ONLY): 15 min  Charges:  $Therapeutic Exercise: 8-22 mins                    Mabeline Caras, PT, DPT Acute Rehabilitation Services  Pager [phone] Office [place] 11/04/2019, 5:26 PM

## 2019-11-04 NOTE — Progress Notes (Signed)
STROKE TEAM PROGRESS NOTE   INTERVAL HISTORY I personally reviewed history of presenting illness with the patient, electronic medical records and imaging films in PACS.  He presented with sudden onset of left upper extremity weakness and numbness but did not seek medical advice as he thought he had slept on it wrong and would get better.  He presented beyond time window for TPA.  MRI scan shows an acute small right coronary radiata and slightly older cerebellar infarct likely both embolic.  MRA shows no large vessel intracranial stenosis or occlusion.  Echocardiogram and carotid ultrasound pending.  LDL cholesterol is 35 mg percent and hemoglobin A1c is 5.7.  Vitals:   11/04/19 0445 11/04/19 0811 11/04/19 0921 11/04/19 1333  BP: (!) 134/100 (!) 158/93  (!) 110/97  Pulse: 78 83  98  Resp: 18 17  18   Temp: 98 F (36.7 C) 98.1 F (36.7 C)  98 F (36.7 C)  TempSrc: Oral Oral  Oral  SpO2: 100% 99% 96% 96%  Weight:      Height:        CBC:  Recent Labs  Lab 11/03/19 1140 11/03/19 1157  WBC 10.9*  --   NEUTROABS 6.0  --   HGB 17.5* 17.0  HCT 51.4 50.0  MCV 94.8  --   PLT 195  --    Basic Metabolic Panel:  Recent Labs  Lab 11/03/19 1140 11/03/19 1157  NA 138 141  K 3.4* 3.4*  CL 101 101  CO2 25  --   GLUCOSE 117* 114*  BUN 12 15  CREATININE 1.71* 1.70*  CALCIUM 9.4  --    Lipid Panel:  Recent Labs  Lab 11/03/19 1140  CHOL 99  TRIG 58  HDL 52  CHOLHDL 1.9  VLDL 12  LDLCALC 35   HgbA1c:  Recent Labs  Lab 11/04/19 0424  HGBA1C 5.7*   Urine Drug Screen: No results for input(s): LABOPIA, COCAINSCRNUR, LABBENZ, AMPHETMU, THCU, LABBARB in the last 168 hours.  Alcohol Level No results for input(s): ETH in the last 168 hours.  IMAGING past 24 hours MR ANGIO HEAD WO CONTRAST  Result Date: 11/03/2019 CLINICAL DATA:  Abnormal CT, stroke suspected EXAM: MRI HEAD WITHOUT CONTRAST MRA HEAD WITHOUT CONTRAST TECHNIQUE: Multiplanar, multiecho pulse sequences of the brain and  surrounding structures were obtained without intravenous contrast. Angiographic images of the head were obtained using MRA technique without contrast. COMPARISON:  None. FINDINGS: MRI HEAD Brain: There is a subcentimeter focus of minimally reduced diffusion in the right cerebellar hemisphere. Additional 12 mm focus of reduced diffusion in the right corona radiata. There are small chronic infarcts of the right caudate, left corona radiata, and left cerebellum. Additional patchy T2 hyperintensity in the supratentorial and pontine white matter is nonspecific but may reflect mild to moderate chronic microvascular ischemic changes. Prominence of the ventricles and sulci reflects minor generalized parenchymal volume loss. There is no intracranial mass, mass effect, or edema. There is no hydrocephalus or extra-axial fluid collection. Vascular: Major vessel flow voids at the skull base are preserved. Skull and upper cervical spine: Normal marrow signal is preserved. Sinuses/Orbits: Paranasal sinuses are aerated. Right lens replacement. Other: Sella is unremarkable.  Mastoid air cells are clear. MRA HEAD Intracranial internal carotid arteries are patent. Middle and anterior cerebral arteries are patent. Intracranial vertebral arteries, basilar artery, posterior cerebral arteries are patent. Bilateral posterior communicating arteries are present with fetal or near fetal origin of the posterior cerebral arteries. There is no significant stenosis or  aneurysm. IMPRESSION: Small acute infarct of the right corona radiata. Smaller acute to subacute infarct of the right cerebellar hemisphere. Mild to moderate chronic microvascular ischemic changes. Few chronic small infarcts as detailed above. No proximal intracranial vessel occlusion or significant stenosis. Electronically Signed   By: Macy Mis M.D.   On: 11/03/2019 16:00   MR BRAIN WO CONTRAST  Result Date: 11/03/2019 CLINICAL DATA:  Abnormal CT, stroke suspected EXAM:  MRI HEAD WITHOUT CONTRAST MRA HEAD WITHOUT CONTRAST TECHNIQUE: Multiplanar, multiecho pulse sequences of the brain and surrounding structures were obtained without intravenous contrast. Angiographic images of the head were obtained using MRA technique without contrast. COMPARISON:  None. FINDINGS: MRI HEAD Brain: There is a subcentimeter focus of minimally reduced diffusion in the right cerebellar hemisphere. Additional 12 mm focus of reduced diffusion in the right corona radiata. There are small chronic infarcts of the right caudate, left corona radiata, and left cerebellum. Additional patchy T2 hyperintensity in the supratentorial and pontine white matter is nonspecific but may reflect mild to moderate chronic microvascular ischemic changes. Prominence of the ventricles and sulci reflects minor generalized parenchymal volume loss. There is no intracranial mass, mass effect, or edema. There is no hydrocephalus or extra-axial fluid collection. Vascular: Major vessel flow voids at the skull base are preserved. Skull and upper cervical spine: Normal marrow signal is preserved. Sinuses/Orbits: Paranasal sinuses are aerated. Right lens replacement. Other: Sella is unremarkable.  Mastoid air cells are clear. MRA HEAD Intracranial internal carotid arteries are patent. Middle and anterior cerebral arteries are patent. Intracranial vertebral arteries, basilar artery, posterior cerebral arteries are patent. Bilateral posterior communicating arteries are present with fetal or near fetal origin of the posterior cerebral arteries. There is no significant stenosis or aneurysm. IMPRESSION: Small acute infarct of the right corona radiata. Smaller acute to subacute infarct of the right cerebellar hemisphere. Mild to moderate chronic microvascular ischemic changes. Few chronic small infarcts as detailed above. No proximal intracranial vessel occlusion or significant stenosis. Electronically Signed   By: Macy Mis M.D.   On:  11/03/2019 16:00   VAS US CAROTID  Result Date: 11/04/2019 Carotid Arterial Duplex Study Indications:       Carotid stenosis. Risk Factors:      Hypertension. Comparison Study:  no prior Performing Technologist: Abram Sander RVS  Examination Guidelines: A complete evaluation includes B-mode imaging, spectral Doppler, color Doppler, and power Doppler as needed of all accessible portions of each vessel. Bilateral testing is considered an integral part of a complete examination. Limited examinations for reoccurring indications may be performed as noted.  Right Carotid Findings: +----------+--------+--------+--------+------------------+--------+           PSV cm/sEDV cm/sStenosisPlaque DescriptionComments +----------+--------+--------+--------+------------------+--------+ CCA Prox  62      17              heterogenous               +----------+--------+--------+--------+------------------+--------+ CCA Distal68      19              heterogenous               +----------+--------+--------+--------+------------------+--------+ ICA Prox  46      14      1-39%   heterogenous               +----------+--------+--------+--------+------------------+--------+ ICA Distal95      30                                         +----------+--------+--------+--------+------------------+--------+  ECA       69      17                                         +----------+--------+--------+--------+------------------+--------+ +----------+--------+-------+--------+-------------------+           PSV cm/sEDV cmsDescribeArm Pressure (mmHG) +----------+--------+-------+--------+-------------------+ [id number]                                         +----------+--------+-------+--------+-------------------+ +---------+--------+--------+------------+ VertebralPSV cm/sEDV cm/sNot assessed +---------+--------+--------+------------+  Left Carotid Findings:  +----------+--------+--------+--------+------------------+--------+           PSV cm/sEDV cm/sStenosisPlaque DescriptionComments +----------+--------+--------+--------+------------------+--------+ CCA Prox  61      21              heterogenous               +----------+--------+--------+--------+------------------+--------+ CCA Distal52      14              heterogenous               +----------+--------+--------+--------+------------------+--------+ ICA Prox  46      22      1-39%   heterogenous               +----------+--------+--------+--------+------------------+--------+ ICA Distal54      19                                         +----------+--------+--------+--------+------------------+--------+ ECA       65      21                                         +----------+--------+--------+--------+------------------+--------+ +----------+--------+--------+--------+-------------------+           PSV cm/sEDV cm/sDescribeArm Pressure (mmHG) +----------+--------+--------+--------+-------------------+ [id number]                                         +----------+--------+--------+--------+-------------------+ +---------+--------+--------+------------+ VertebralPSV cm/sEDV cm/sNot assessed +---------+--------+--------+------------+   Summary: Right Carotid: Velocities in the right ICA are consistent with a 1-39% stenosis. Left Carotid: Velocities in the left ICA are consistent with a 1-39% stenosis. Vertebrals: Bilateral vertebral arteries were not visualized. *See table(s) above for measurements and observations.  Electronically signed by Antony Contras MD on 11/04/2019 at 12:52:48 PM.    Final     PHYSICAL EXAM Pleasant elderly Caucasian male not in distress. . Afebrile. Head is nontraumatic. Neck is supple without bruit.    Cardiac exam no murmur or gallop. Lungs are clear to auscultation. Distal pulses are well felt. Neurological Exam ;  Awake  Alert  oriented x 3. Normal speech and language.eye movements full without nystagmus.fundi were not visualized. Vision acuity and fields appear normal. Hearing is normal. Palatal movements are normal.  Mild left lower facial asymmetry.  Tongue midline. Normal strength, tone, reflexes and coordination.  Except left upper extremity weakness with hand and grip weakness with diminished fine finger movements and orbits right over left upper extremity.  Diminished touch Pinprick left upper  extremity sensation. Gait deferred.  ASSESSMENT/PLAN Antonio Bowen is a 74 y.o. male with history of tobacco abuse, stroke, low back pain, hypertension, dysrhythmia, arthritis presenting with L arm weakness and numbness.   Stroke:   R corona radiata and R cerebellar infarcts in 2 different vascular territories, infarcts embolic secondary to unknown source  CT head age indeterminate R cerebellar infarct new from 02/18/2015. New chronic L basal ganglia lacune. Old small R caudate nucleus and L cerebellar infarcts. Small vessel disease. Atrophy. Sinus dz.  MRI  Small R corona radiata infarct. Few acute/subacute R cerebellar infarcts. Small vessel disease.   MRA  Unremarkable   Carotid Doppler  B ICA 1-39% stenosis, VAs antegrade   2D Echo pending [place] electrophysiologist will consult and consider placement of an implantable loop recorder to evaluate for atrial fibrillation as etiology of stroke. This has been explained to patient by Dr. Leonie Man and he is agreeable.   LDL 35  HgbA1c 5.7  VTE prophylaxis - Heparin 5000 units sq tid   No antithrombotic prior to admission (no aspirin as on Meloxicam for arthritis), now on clopidogrel 75 mg daily. Listed allergy to aspirin of "upset stomach, bleeding". Dr. Leonie Man recommends DAPT x 3 weeks then plavix alone if pt can tolerate.     Therapy recommendations:  OP PT, no SLP  Disposition:  Return home  Hypertension  As high as 165/113 on  admission   Stable now  . Permissive hypertension (OK if < 220/120) but gradually normalize in 5-7 days . Long-term BP goal normotensive  Hyperlipidemia  Home meds:  No listed statin, though LDL very low for pt not on statin  Now on lipitor 40  LDL 35, goal < 70  Continue statin at discharge  Other Stroke Risk Factors  Advanced age  Former Cigarette smoker, quit 9 yrs ago  ETOH use, advised to drink no more than 2 drink(s) a day  Obesity, Body mass index is 30.21 kg/m., recommend weight loss, diet and exercise as appropriate   Hx stroke/TIA seen on imaging.   Family hx stroke (father)  Other Active Problems  COPD on home [facility] day # 1  He presented with left upper extremity weakness secondary to right brain subcortical infarct but MRI also shows cerebellar infarct.  Recommend continue ongoing stroke work-up and dual antiplatelet therapy of aspirin Plavix for 3 weeks followed by Plavix alone.  Aggressive risk factor modification.  Greater than 50% time during this 35-minute visit was spent on counseling and coordination of care about his strokes and answering questions.D/W Dr Reesa Chew Antony Contras, MD To contact Stroke Continuity provider, please refer to [email or web address] After hours, contact General Neurology

## 2019-11-04 NOTE — Progress Notes (Signed)
EEG complete - results pending 

## 2019-11-04 NOTE — Evaluation (Addendum)
Physical Therapy Evaluation Patient Details Name: Antonio Bowen MRN: [id number] DOB: [birth year] Today's Date: 11/04/2019   History of Present Illness  Pt is a 74 y.o. male with PMH of HTN, COPD, BOOP, GERD, Schatzki's ring, hypothyroidism, hearing loss, HLD, CKD, and degeneration of lumbar intervertebral disc presenting to ED with numbeness and weakness on L side. MRI reveals small acute infarct of the right corona radiata.  Smaller acute to subacute infarct in the right cerebellar hemisphere was also seen.  Clinical Impression  Pt admitted with above diagnosis. At the time of PT eval pt was able to perform transfers and ambulation with up to min guard assist for occasional unsteadiness during gait training. Pt with mildly antalgic gait at baseline, however wife and son report pt seems slightly more unsteady than normal. They also attribute mild slurring of speech to the fact that the pt does not have dentures glued in and they are loose. It appears most of the patient's deficits are with the LUE. Will continue to follow acutely for PT however anticipate follow-up will be largely OT oriented. Recommend a neuro specific outpatient if that is available near the patient's home (they are not from [place]). Pt currently with functional limitations due to the deficits listed below (see PT Problem List). Pt will benefit from skilled PT to increase their independence and safety with mobility to allow discharge to the venue listed below.       Follow Up Recommendations Outpatient PT (vs outpatient OT)    Equipment Recommendations  None recommended by PT    Recommendations for Other Services       Precautions / Restrictions Precautions Precautions: Fall Precaution Comments: O2 dependent at night, wears occasionally during the day. Mildly impulsive - I don't think this is a cognitive deficit, it is likely more a personality trait. Restrictions Weight Bearing Restrictions: No      Mobility  Bed  Mobility Overal bed mobility: Modified Independent             General bed mobility comments: Increased time. Pt sitting up to R side of bed when PT arrived and transitioned to sitting on L side of bed for history interview. Pt laid supine and sat up to L side of bed without assistance.   Transfers Overall transfer level: Modified independent Equipment used: None             General transfer comment: No assist required. Pt stood without difficulty and without AD.   Ambulation/Gait Ambulation/Gait assistance: Min guard;Supervision Gait Distance (Feet): 200 Feet Assistive device: None Gait Pattern/deviations: Step-through pattern;Decreased stride length;Antalgic;Decreased stance time - right Gait velocity: Decreased Gait velocity interpretation: <1.8 ft/sec, indicate of risk for recurrent falls General Gait Details: Antalgic due to baseline RLE pain stemming from back per pt report. Grossly at a supervision level with occasional min guard assist provided for safety. Pt's wife and son report pt appears slightly more unsteady than at baseline.   Stairs            Wheelchair Mobility    Modified Rankin (Stroke Patients Only) Modified Rankin (Stroke Patients Only) Pre-Morbid Rankin Score: No significant disability Modified Rankin: Moderately severe disability     Balance Overall balance assessment: Needs assistance Sitting-balance support: Feet supported;No upper extremity supported Sitting balance-Leahy Scale: Poor Sitting balance - Comments: posterior lean with MMT without UE support   Standing balance support: No upper extremity supported;During functional activity Standing balance-Leahy Scale: Fair  Pertinent Vitals/Pain Pain Assessment: No/denies pain    Home Living Family/patient expects to be discharged to:: Private residence Living Arrangements: Spouse/significant other Available Help at Discharge:  Family;Available 24 hours/day Type of Home: House Home Access: Level entry     Home Layout: One level Home Equipment: Walker - 2 wheels;Cane - single point;Shower seat      Prior Function Level of Independence: Independent               Hand Dominance   Dominant Hand: Right    Extremity/Trunk Assessment   Upper Extremity Assessment Upper Extremity Assessment: LUE deficits/detail LUE Deficits / Details: Decreased strength and AROM. Using momentum to flex at shoulder and elbow at times. 3/5 strength shoulder flexion, 4-/5 elbow flexion, 2/5 grip strength (dynamometer not available) LUE Sensation: decreased light touch (Ulnar distribution in hand) LUE Coordination: decreased fine motor;decreased gross motor    Lower Extremity Assessment Lower Extremity Assessment: RLE deficits/detail RLE Deficits / Details: Per pt, he has baseline bulging discs and disc degeneration at or around L2 that he has received multiple shots for. Pt confirms baseline antalgic gait due to RLE pain stemming from his back. Wife reports the pt appears slightly unsteady compared to normal however. Bilateral LE strength 5/5 with MMT RLE Sensation: WNL    Cervical / Trunk Assessment Cervical / Trunk Assessment: Other exceptions Cervical / Trunk Exceptions: forward head posture with rounded shoulders  Communication   Communication: No difficulties  Cognition Arousal/Alertness: Awake/alert Behavior During Therapy: WFL for tasks assessed/performed (Mildly impulsive) Overall Cognitive Status: Within Functional Limits for tasks assessed                                        General Comments      Exercises     Assessment/Plan    PT Assessment Patient needs continued PT services  PT Problem List Decreased strength;Decreased range of motion;Decreased activity tolerance;Decreased balance;Decreased mobility;Decreased coordination;Decreased safety awareness;Decreased knowledge of  precautions;Impaired sensation       PT Treatment Interventions DME instruction;Gait training;Functional mobility training;Therapeutic activities;Therapeutic exercise;Neuromuscular re-education;Patient/family education    PT Goals (Current goals can be found in the Care Plan section)  Acute Rehab PT Goals Patient Stated Goal: Get back to playing golf PT Goal Formulation: With patient/family Time For Goal Achievement: 11/11/19 Potential to Achieve Goals: Good    Frequency Min 4X/week   Barriers to discharge        Co-evaluation               AM-PAC PT "6 Clicks" Mobility  Outcome Measure Help needed turning from your back to your side while in a flat bed without using bedrails?: None Help needed moving from lying on your back to sitting on the side of a flat bed without using bedrails?: None Help needed moving to and from a bed to a chair (including a wheelchair)?: None Help needed standing up from a chair using your arms (e.g., wheelchair or bedside chair)?: None Help needed to walk in hospital room?: None Help needed climbing 3-5 steps with a railing? : A Little 6 Click Score: 23    End of Session Equipment Utilized During Treatment: Gait belt Activity Tolerance: Patient tolerated treatment well Patient left: with call bell/phone within reach;with family/visitor present (Sitting EOB) Nurse Communication: Mobility status PT Visit Diagnosis: Hemiplegia and hemiparesis Hemiplegia - Right/Left: Left Hemiplegia - dominant/non-dominant: Non-dominant Hemiplegia - caused  by: Cerebral infarction    Time: 1207-1234 PT Time Calculation (min) (ACUTE ONLY): 27 min   Charges:   PT Evaluation $PT Eval Moderate Complexity: 1 Mod PT Treatments $Gait Training: 8-22 mins        Rolinda Roan, PT, DPT Acute Rehabilitation Services Pager: [phone] Office: [phone]   Thelma Comp 11/04/2019, 1:42 PM

## 2019-11-04 NOTE — Evaluation (Signed)
Occupational Therapy Evaluation Patient Details Name: Antonio Bowen MRN: [id number] DOB: 31-Jul-1945 Today's Date: 11/04/2019    History of Present Illness Pt is a 74 y.o. male with PMH of HTN, COPD, BOOP, GERD, Schatzki's ring, hypothyroidism, hearing loss, HLD, CKD, and degeneration of lumbar intervertebral disc presenting to ED with numbeness and weakness on L side. MRI reveals small acute infarct of the right corona radiata.  Smaller acute to subacute infarct in the right cerebellar hemisphere was also seen.   Clinical Impression   PTA pt reports being independent with ADLs and using oxygen at night. Pt was admitted for above and treated for problem list below (see OT Problem List). Pt is A&Ox4 and requires verbal cues for safety awareness. Presented anxious and impulsive, but may believe it is his personality and baseline. Requires Supervision - Min A with verbal cues for ADLs due to decreased functional use of L UE and unsteadiness. Requires Min guard with transfers and ambulation with verbal cues for safety. Pt's wife and son present for eval - very supportive and able to confirm PLOF, home set up, and support. Educated pt on exercises with squeeze ball and use of L UE to promote functional use and strength. Believe pt would benefit from skilled OT services acutely and at the outpatient level to increase independence with ADLs.  Supervising OTR present and assisted with evaluation and documentation.    Follow Up Recommendations  Outpatient OT    Equipment Recommendations  None recommended by OT       Precautions / Restrictions Precautions Precautions: Fall Precaution Comments: O2 dependent at night Restrictions Weight Bearing Restrictions: No      Mobility Bed Mobility Overal bed mobility: Modified Independent             General bed mobility comments: increased time to sit up EOB from supine  Transfers Overall transfer level: Needs assistance Equipment used: None  Transfers: Sit to/from Stand Sit to Stand: Min guard         General transfer comment: Min guard for safety    Balance Overall balance assessment: Needs assistance Sitting-balance support: Feet supported;No upper extremity supported Sitting balance-Leahy Scale: Fair Sitting balance - Comments: posterior lean with MMT without UE support   Standing balance support: No upper extremity supported;During functional activity Standing balance-Leahy Scale: Fair Standing balance comment: noted unsteadiness while grooming at sink                           ADL either performed or assessed with clinical judgement   ADL Overall ADL's : Needs assistance/impaired     Grooming: Wash/dry face;Supervision/safety;Standing Grooming Details (indicate cue type and reason): supervision for safety Upper Body Bathing: Minimal assistance;Sitting Upper Body Bathing Details (indicate cue type and reason): Min A due to lack of ROM in L UE Lower Body Bathing: Min guard;Sit to/from stand Lower Body Bathing Details (indicate cue type and reason): Min guard for safety Upper Body Dressing : Minimal assistance;Sitting Upper Body Dressing Details (indicate cue type and reason): Min A due to decreased coordination and strength in L UE Lower Body Dressing: Minimal assistance;Sit to/from stand Lower Body Dressing Details (indicate cue type and reason): Min A due to decreased ROM and coordination L UE. Toilet Transfer: Min guard;Comfort height toilet;Ambulation Toilet Transfer Details (indicate cue type and reason): Min guard for safety Toileting- Clothing Manipulation and Hygiene: Sitting/lateral lean;Min guard;Sit to/from stand Toileting - Clothing Manipulation Details (indicate cue type and reason):  Min guard for safety Tub/ Shower Transfer: Min guard;Ambulation;Shower Scientist, research (medical) Details (indicate cue type and reason): Min guard for safety Functional mobility during ADLs: Min guard;Cueing  for safety General ADL Comments: Supervision - Min A with ADLs due to decreased balance and decreased functional use of L UE     Vision Baseline Vision/History: No visual deficits Patient Visual Report: No change from baseline Vision Assessment?: Yes Eye Alignment: Within Functional Limits Ocular Range of Motion: Within Functional Limits Alignment/Gaze Preference: Within Defined Limits Tracking/Visual Pursuits: Able to track stimulus in all quads without difficulty            Pertinent Vitals/Pain Pain Assessment: No/denies pain     Hand Dominance Right   Extremity/Trunk Assessment Upper Extremity Assessment Upper Extremity Assessment: LUE deficits/detail LUE Deficits / Details: Decreased strength and ROM. Using shoulder to compensate for arm movements. 3/5 shoulder flexion, 3/5 wrist extension and 2/5 grip strength LUE Sensation: decreased light touch LUE Coordination: decreased fine motor;decreased gross motor   Lower Extremity Assessment Lower Extremity Assessment: Defer to PT evaluation RLE Deficits / Details: Per pt, he has baseline bulging discs and disc degeneration at or around L2 that he has received multiple shots for. Pt confirms baseline antalgic gait due to RLE pain stemming from his back. Wife reports the pt appears slightly unsteady compared to normal however. Bilateral LE strength 5/5 with MMT RLE Sensation: WNL   Cervical / Trunk Assessment Cervical / Trunk Assessment: Other exceptions Cervical / Trunk Exceptions: head forward with rounded shoulders   Communication Communication Communication: No difficulties   Cognition Arousal/Alertness: Awake/alert Behavior During Therapy: WFL for tasks assessed/performed;Anxious Overall Cognitive Status: Within Functional Limits for tasks assessed                                 General Comments: Pt feeling anxious and somewhat impulsive - but believe it is part of his personality from him making jokes  and pretending to have wobbly knees when getting up from bed.    General Comments  Pt wife and son present for eval to confirm PLOF, home set up, and support. Given squeeze ball and given basic exercises palmar grasp, lateral pinch, and tip pinch. Educated on using L UE for functional activities to increase functional use            Home Living Family/patient expects to be discharged to:: Private residence Living Arrangements: Spouse/significant other Available Help at Discharge: Family;Available 24 hours/day Type of Home: House Home Access: Level entry     Home Layout: One level     Bathroom Shower/Tub: Occupational psychologist: Handicapped height     Home Equipment: Environmental consultant - 2 wheels;Cane - single point;Shower seat      Lives With: Spouse    Prior Functioning/Environment Level of Independence: Independent                 OT Problem List: Decreased strength;Decreased range of motion;Impaired balance (sitting and/or standing);Decreased coordination;Decreased safety awareness;Impaired sensation;Impaired UE functional use      OT Treatment/Interventions: Self-care/ADL training;Therapeutic exercise;Neuromuscular education;Manual therapy;Therapeutic activities;Patient/family education;Balance training    OT Goals(Current goals can be found in the care plan section) Acute Rehab OT Goals Patient Stated Goal: Get back to playing golf OT Goal Formulation: With patient Time For Goal Achievement: 11/18/19 Potential to Achieve Goals: Good  OT Frequency: Min 2X/week    AM-PAC OT "6 Clicks" Daily  Activity     Outcome Measure Help from another person eating meals?: None Help from another person taking care of personal grooming?: A Little Help from another person toileting, which includes using toliet, bedpan, or urinal?: A Little Help from another person bathing (including washing, rinsing, drying)?: A Little Help from another person to put on and taking off regular  upper body clothing?: A Little Help from another person to put on and taking off regular lower body clothing?: A Little 6 Click Score: 19   End of Session Equipment Utilized During Treatment: Gait belt;Oxygen Nurse Communication: Mobility status;Other (comment) (anxiety level)  Activity Tolerance: Patient tolerated treatment well Patient left: in bed;with call bell/phone within reach;with bed alarm set  OT Visit Diagnosis: Unsteadiness on feet (R26.81);Muscle weakness (generalized) (M62.81);Other symptoms and signs involving the nervous system (R29.898)                Time: 1610-9604 OT Time Calculation (min): 17 min Charges:  OT General Charges $OT Visit: 1 Visit OT Evaluation $OT Eval Moderate Complexity: 1 Mod  Christina Borzacchiello/OTS  Christina Borzacchiello 11/04/2019, 4:09 PM

## 2019-11-04 NOTE — Progress Notes (Signed)
Carotid duplex has been completed.   Preliminary results in CV Proc.   Abram Sander 11/04/2019 11:18 AM

## 2019-11-04 NOTE — TOC Initial Note (Signed)
Transition of Care [facility]) - Initial/Assessment Note    Patient Details  Name: Antonio Bowen MRN: [id number] Date of Birth: [birth year]  [facility] CM/SW Contact:    Pollie Friar, RN Phone Number: 11/04/2019, 4:32 PM  Clinical Narrative:                 Pt is from home with spouse. Pt is upset about observation status and is asking to d/c home. MD updated.  Pt is refusing outpatient therapy d/t the cost. He asked that the therapies provide him exercises to work on at home. CM called PT office and asked that PT provide him the resources.  TOC following.  Expected Discharge Plan: Home/Self Care Barriers to Discharge: Continued Medical Work up   Patient Goals and CMS Choice        Expected Discharge Plan and Services Expected Discharge Plan: Home/Self Care   Discharge Planning Services: CM Consult   Living arrangements for the past 2 months: Single Family Home                                      Prior Living Arrangements/Services Living arrangements for the past 2 months: Single Family Home Lives with:: Spouse Patient language and need for interpreter reviewed:: Yes Do you feel safe going back to the place where you live?: Yes      Need for Family Participation in Patient Care: Yes (Comment) Care giver support system in place?: Yes (comment)   Criminal Activity/Legal Involvement Pertinent to Current Situation/Hospitalization: No - Comment as needed  Activities of Daily Living Home Assistive Devices/Equipment: None ADL Screening (condition at time of admission) Patient's cognitive ability adequate to safely complete daily activities?: Yes Is the patient deaf or have difficulty hearing?: No Does the patient have difficulty seeing, even when wearing glasses/contacts?: No Does the patient have difficulty concentrating, remembering, or making decisions?: No Patient able to express need for assistance with ADLs?: Yes Does the patient have difficulty  dressing or bathing?: No Independently performs ADLs?: Yes (appropriate for developmental age) Does the patient have difficulty walking or climbing stairs?: No Weakness of Legs: None Weakness of Arms/Hands: Left  Permission Sought/Granted                  Emotional Assessment Appearance:: Appears stated age Attitude/Demeanor/Rapport: Engaged Affect (typically observed): Accepting Orientation: : Oriented to Self, Oriented to Place, Oriented to  Time, Oriented to Situation   Psych Involvement: No (comment)  Admission diagnosis:  CVA (cerebral vascular accident) ([place]) [I63.9] Acute CVA (cerebrovascular accident) [facility]) [I63.9] Patient Active Problem List   Diagnosis Date Noted  . CVA (cerebral vascular accident) ([place]) 11/04/2019  . Acute CVA (cerebrovascular accident) ([place]) 11/03/2019  . Esophageal stricture   . Chronic kidney disease (CKD), stage III (moderate) 09/12/2018  . Lateral epicondylitis 07/03/2017  . Pain in joint of left elbow 06/05/2017  . Dysphagia 02/23/2016  . Schatzki's ring 02/23/2016  . Oxygen dependent 02/23/2016  . Hyperlipidemia LDL goal <130 06/01/2015  . BOOP (bronchiolitis obliterans with organizing pneumonia) ([place]) 09/29/2010  . Hearing loss, conductive, bilateral 09/21/2010  . Hypothyroidism 09/21/2010  . Obstructive chronic bronchitis with exacerbation Gold B Copd   . Hypertension   . GERD (gastroesophageal reflux disease)   . Low back pain    PCP:  [name], [name], MD Pharmacy:   [facility] [address] [place]  [address] [place]  [address] Phone: [phone] Fax: [phone]  [facility] [place], [place] [address] Phone: [phone] Fax: [phone]  [id number] [place], [place] [address] Phone: [phone] Fax: [phone]  [facility] [place] [place] - [address] [place] [address] Phone: [phone] Fax: [phone]     Social Determinants of Health (SDOH) Interventions    Readmission Risk Interventions No flowsheet data found.

## 2019-11-04 NOTE — Care Management CC44 (Signed)
Condition Code 44 Documentation Completed  Patient Details  Name: Antonio Bowen MRN: [id number] Date of Birth: 04/24/1946   Condition Code 44 given:  Yes Patient signature on Condition Code 44 notice:  Yes Documentation of 2 MD's agreement:  Yes Code 44 added to claim:  Yes    Pollie Friar, RN 11/04/2019, 4:07 PM

## 2019-11-05 ENCOUNTER — Encounter (HOSPITAL_COMMUNITY): Payer: Self-pay | Admitting: Internal Medicine

## 2019-11-05 ENCOUNTER — Encounter (HOSPITAL_COMMUNITY)
Admission: EM | Disposition: A | Discharge status: Home / Self Care | Payer: Self-pay | Source: Home / Self Care | Attending: Internal Medicine

## 2019-11-05 DIAGNOSIS — I639 Cerebral infarction, unspecified: Secondary | ICD-10-CM | POA: Diagnosis not present

## 2019-11-05 DIAGNOSIS — R531 Weakness: Secondary | ICD-10-CM | POA: Diagnosis not present

## 2019-11-05 HISTORY — PX: LOOP RECORDER INSERTION: EP1214

## 2019-11-05 LAB — BASIC METABOLIC PANEL
Anion gap: 11 (ref 5–15)
BUN: 10 mg/dL (ref 8–23)
CO2: 26 mmol/L (ref 22–32)
Calcium: 9.7 mg/dL (ref 8.9–10.3)
Chloride: 102 mmol/L (ref 98–111)
Creatinine, Ser: 1.58 mg/dL — ABNORMAL HIGH (ref 0.61–1.24)
GFR calc Af Amer: 49 mL/min — ABNORMAL LOW (ref >60)
GFR calc non Af Amer: 42 mL/min — ABNORMAL LOW (ref >60)
Glucose, Bld: 117 mg/dL — ABNORMAL HIGH (ref 70–99)
Potassium: 3.4 mmol/L — ABNORMAL LOW (ref 3.5–5.1)
Sodium: 139 mmol/L (ref 135–145)

## 2019-11-05 LAB — MAGNESIUM: Magnesium: 2.1 mg/dL (ref 1.7–2.4)

## 2019-11-05 SURGERY — LOOP RECORDER INSERTION

## 2019-11-05 MED ORDER — OMEPRAZOLE 40 MG PO CPDR: 40.0000 mg | DELAYED_RELEASE_CAPSULE | Freq: Two times a day (BID) | ORAL | 0 refills | Status: DC

## 2019-11-05 MED ORDER — POTASSIUM CHLORIDE CRYS ER 20 MEQ PO TBCR
40.0000 meq | EXTENDED_RELEASE_TABLET | Freq: Once | ORAL | Status: AC
  Administered 2019-11-05: 40 meq via ORAL
  Filled 2019-11-05: qty 2

## 2019-11-05 MED ORDER — PANTOPRAZOLE SODIUM 40 MG PO TBEC: 40.0000 mg | DELAYED_RELEASE_TABLET | Freq: Two times a day (BID) | ORAL | 0 refills | Status: DC

## 2019-11-05 MED ORDER — ATORVASTATIN CALCIUM 40 MG PO TABS: 40.0000 mg | ORAL_TABLET | Freq: Every day | ORAL | 0 refills | Status: DC

## 2019-11-05 MED ORDER — LIDOCAINE-EPINEPHRINE 1 %-1:100000 IJ SOLN
INTRAMUSCULAR | Status: AC
  Filled 2019-11-05: qty 1

## 2019-11-05 MED ORDER — ASPIRIN EC 81 MG PO TBEC: 81.0000 mg | DELAYED_RELEASE_TABLET | Freq: Every day | ORAL | Status: DC

## 2019-11-05 MED ORDER — CLOPIDOGREL BISULFATE 75 MG PO TABS: 75.0000 mg | ORAL_TABLET | Freq: Every day | ORAL | 0 refills | Status: DC

## 2019-11-05 MED ORDER — LIDOCAINE HCL (PF) 1 % IJ SOLN
INTRAMUSCULAR | Status: DC | PRN
  Administered 2019-11-05: 5 mL

## 2019-11-05 MED ORDER — ASPIRIN EC 81 MG PO TBEC
81.0000 mg | DELAYED_RELEASE_TABLET | Freq: Every day | ORAL | 0 refills | Status: AC
Start: 2019-11-05 — End: 2019-11-26

## 2019-11-05 MED FILL — ASPIRIN LOW DOSE 81 MG TBEC: 81 | 21 days supply | Qty: 21 | Fill #0

## 2019-11-05 MED FILL — CLOPIDOGREL 75 MG TABLET: 75 | 30 days supply | Qty: 30 | Fill #0

## 2019-11-05 MED FILL — ATORVASTATIN CALCIUM 40 MG: 40 | 30 days supply | Qty: 30 | Fill #0

## 2019-11-05 MED FILL — PANTOPRAZOLE SOD DR 40 MG T: 40 | 30 days supply | Qty: 60 | Fill #0

## 2019-11-05 SURGICAL SUPPLY — 2 items
MONITOR REVEAL LINQ II (Prosthesis & Implant Heart) ×1 IMPLANT
PACK LOOP INSERTION (CUSTOM PROCEDURE TRAY) ×2 IMPLANT

## 2019-11-05 NOTE — Progress Notes (Signed)
Occupational Therapy Treatment Patient Details Name: Antonio Bowen MRN: [id number] DOB: 11/03/1945 Today's Date: 11/05/2019    History of present illness Pt is a 74 y.o. male with PMH of HTN, COPD, BOOP, GERD, Schatzki's ring, hypothyroidism, hearing loss, HLD, CKD, and degeneration of lumbar intervertebral disc, presenting 11/03/19 with numbeness and weakness on L side. MRI reveals small acute infarct of the R corona radiata; smaller acute to subacute infarct in R cerebellar hemisphere also seen.   OT comments  Upon arrival, pt upright in bed and eager for skilled-OT session. Pt declined follow-up outpatient OT services, pt and family requesting HEP. L UE was further assessed to determine what exercises would best benefit the pt and what progression would be needed to best educate pt and family. Therex education and demonstration provided, as well as HEP handouts (AROM, stretching, and fine motor coordination). Family supportive and pt motivated to return home and back to leisure activity of playing golf. Pt declined outpatient and [place] services. Educated family on that if they change their mind or circumstances change that they can follow up with PCP to obtain referral for oupatient OT. Functional, sport-related exercises incorporated as well. If to remain admitted, will continue to follow acutely.   Supervising OTR/L present/assisting in treatment and documentation   Follow Up Recommendations  Outpatient OT    Equipment Recommendations  None recommended by OT       Precautions / Restrictions Precautions Precautions: Fall       Mobility Bed Mobility Overal bed mobility: Modified Independent             General bed mobility comments: Sitting EOB  Transfers Overall transfer level: Needs assistance Equipment used: None Transfers: Sit to/from Stand Sit to Stand: Supervision         General transfer comment: Supervision for safety    Balance Overall balance assessment:  Needs assistance Sitting-balance support: Feet supported;No upper extremity supported Sitting balance-Leahy Scale: Good     Standing balance support: No upper extremity supported;During functional activity Standing balance-Leahy Scale: Fair                             ADL either performed or assessed with clinical judgement   ADL                           Toilet Transfer: Supervision/safety;Comfort height toilet;Ambulation Toilet Transfer Details (indicate cue type and reason): Supervision for safety         Functional mobility during ADLs: Supervision/safety;Cueing for safety General ADL Comments: Supervision with trransfers and ambulation                Cognition Arousal/Alertness: Awake/alert Behavior During Therapy: WFL for tasks assessed/performed;Impulsive Overall Cognitive Status: Within Functional Limits for tasks assessed                                          Exercises Exercises: General Upper Extremity;Other exercises General Exercises - Upper Extremity Shoulder Flexion: AROM;10 reps;Seated;Other (comment);Left (table top washes without washcloth) Shoulder Extension: AROM;10 reps;Left;Seated;Other (comment) (table top washes without washcloth) Shoulder Horizontal ABduction: AROM;10 reps;Left;Seated;Other (comment) (table top washes without washcloth) Shoulder Horizontal ADduction: AROM;Left;10 reps;Other (comment);Seated (table top washes without washcloth) Other Exercises Other Exercises: [employer] HEP and at home fine motor coordination handout provided (Access Code [email or web address]  and [email or web address]); also discussed functional ways to incorporate LUE as much as possible, progression with exercises (horizontal plane to vertical plane & AROM to using weight once stronger, as well as sport-specific tasks as pt hopeful to return to golfing. Other Exercises: seated table top wash without washcloth. Attempted wall wash but pt could not  complete due to FM and proximal weakness. Pt needing verbal cues for posture with trying to compensate for strength and ROM in elbow, wrist, and fingers.       General Comments Wife and son present for treatment    Pertinent Vitals/ Pain       Pain Assessment: No/denies pain         Frequency  Min 2X/week        Progress Toward Goals  OT Goals(current goals can now be found in the care plan section)   Progressing towards goals  Acute Rehab OT Goals Patient Stated Goal: Get back to playing golf OT Goal Formulation: With patient Time For Goal Achievement: 11/18/19 Potential to Achieve Goals: Good  Plan   Discharge plan remain appropriate      AM-PAC OT "6 Clicks" Daily Activity     Outcome Measure   Help from another person eating meals?: None Help from another person taking care of personal grooming?: A Little Help from another person toileting, which includes using toliet, bedpan, or urinal?: A Little Help from another person bathing (including washing, rinsing, drying)?: A Little Help from another person to put on and taking off regular upper body clothing?: A Little Help from another person to put on and taking off regular lower body clothing?: A Little 6 Click Score: 19    End of Session Equipment Utilized During Treatment: Gait belt;Oxygen  OT Visit Diagnosis: Unsteadiness on feet (R26.81);Muscle weakness (generalized) (M62.81);Other symptoms and signs involving the nervous system (R29.898)   Activity Tolerance Patient tolerated treatment well   Patient Left in bed;with call bell/phone within reach;with bed alarm set   Nurse Communication Mobility status        Time: 6468-0321 OT Time Calculation (min): 47 min  Charges: OT General Charges $OT Visit: 1 Visit OT Treatments $Therapeutic Activity: 8-22 mins $Neuromuscular Re-education: 23-37 mins  [name]/OTS   [name] 11/05/2019, 11:16 AM

## 2019-11-05 NOTE — Discharge Instructions (Signed)

## 2019-11-05 NOTE — Interval H&P Note (Signed)
History and Physical Interval Note:  11/05/2019 11:53 AM  Antonio Bowen  has presented today for surgery, with the diagnosis of stroke.  The various methods of treatment have been discussed with the patient and family. After consideration of risks, benefits and other options for treatment, the patient has consented to  Procedure(s): LOOP RECORDER INSERTION (N/A) as a surgical intervention.  The patient's history has been reviewed, patient examined, no change in status, stable for surgery.  I have reviewed the patient's chart and labs.  Questions were answered to the patient's satisfaction.     Thompson Grayer

## 2019-11-05 NOTE — Progress Notes (Signed)
Patient has been discharged from unit via wheelchair. All IV's have been removed and tele has been discontinued. All AVS documentation has been reviewed and loop recorder has been reviewed as well. Patient reports of no pain and was sent in good spirits.

## 2019-11-05 NOTE — H&P (View-Only) (Signed)
ELECTROPHYSIOLOGY CONSULT NOTE  Patient ID: Antonio Bowen MRN: [id number], DOB/AGE: 1945-10-12, 74   Admit date: 11/03/2019 Date of Consult: 11/05/2019  Primary Physician: [name], [name], MD Primary Cardiologist: No primary care provider on file.  Primary Electrophysiologist: New to Dr. Rayann Heman Reason for Consultation: Cryptogenic stroke; recommendations regarding Implantable Loop Recorder Insurance: [employer]  History of Present Illness EP has been asked to evaluate [name] for placement of an implantable loop recorder to monitor for atrial fibrillation by Dr Leonie Man.  The patient was admitted on 11/03/2019 with sudden LUE weakness and numbness outside of tPA window.   Imaging demonstrated acute R corona radiata and R cerebella infarcts in 2 different vascular territories.  They have undergone workup for stroke including echocardiogram and carotid dopplers.  The patient has been monitored on telemetry which has demonstrated sinus rhythm with no arrhythmias.  Inpatient stroke work-up will not require a TEE per Neurology.   Echocardiogram this admission demonstrated 60-65%.  Lab work is reviewed.  Prior to admission, the patient denies chest pain, shortness of breath, dizziness,  or syncope. He has infrequent palpitations, once or twice a year. Previous Holter showed PACs/PVCs as below. They are recovering from their stroke with plans to return home  at discharge.  Past Medical History:  Diagnosis Date  . Allergic rhinitis   . Arthritis   . BOOP (bronchiolitis obliterans with organizing pneumonia) ([place])   . Bronchitis   . Colon polyps   . COPD (chronic obstructive pulmonary disease) ([place])   . Dyspnea    with exertion   . Dysrhythmia    skipped beats- followed by Dr Angelena Form   . GERD (gastroesophageal reflux disease)   . Hypertension   . Hypothyroidism   . Kidney stone   . Low back pain   . Oxygen dependent    uses 3L/[place] at nite occasional during day if needed   .  Stroke [facility])    hx of 2 strokes which patient did not know he had had   . Thyroid disease   . Tobacco dependence      Surgical History:  Past Surgical History:  Procedure Laterality Date  . BIOPSY  08/18/2019   Procedure: BIOPSY;  Surgeon: Irene Shipper, MD;  Location: [facility];  Service: Endoscopy;;  . broncoscopy     . carpel tunnal     left  . COLONOSCOPY     x 4  . DUPUYTREN CONTRACTURE RELEASE     Left  . ESOPHAGOGASTRODUODENOSCOPY (EGD) WITH PROPOFOL N/A 03/26/2016   Procedure: ESOPHAGOGASTRODUODENOSCOPY (EGD) WITH PROPOFOL;  Surgeon: Irene Shipper, MD;  Location: [facility];  Service: Endoscopy;  Laterality: N/A;  . ESOPHAGOGASTRODUODENOSCOPY (EGD) WITH PROPOFOL N/A 08/18/2019   Procedure: ESOPHAGOGASTRODUODENOSCOPY (EGD) WITH PROPOFOL;  Surgeon: Irene Shipper, MD;  Location: [facility] ENDOSCOPY;  Service: Endoscopy;  Laterality: N/A;  Possible dilation  . ESOPHAGOGASTRODUODENOSCOPY ENDOSCOPY     x 3  . FACIAL FRACTURE SURGERY    . OTHER SURGICAL HISTORY     facial surgery  . SAVORY DILATION N/A 08/18/2019   Procedure: SAVORY DILATION;  Surgeon: Irene Shipper, MD;  Location: [facility];  Service: Endoscopy;  Laterality: N/A;  . TOTAL HIP ARTHROPLASTY  01/2011   Right hip, [place]  . ULNAR NERVE TRANSPOSITION     left     Medications Prior to Admission  Medication Sig Dispense Refill Last Dose  . acetaminophen (TYLENOL) 500 MG tablet Take 1,000 mg by mouth 2 (two) times  daily as needed (for pain.).   unknown  . albuterol (VENTOLIN HFA) 108 (90 Base) MCG/ACT inhaler Inhale 1-2 puffs into the lungs every 4 (four) hours as needed for wheezing or shortness of breath. INHALE TWO PUFFS INTO LUNGS EVERY 6 HOURS AS NEEDED FOR WHEEZING 18 g 3 11/03/2019 at Unknown time  . budesonide-formoterol (SYMBICORT) 160-4.5 MCG/ACT inhaler Inhale 2 puffs into the lungs in the morning and at bedtime. Needs to be seen for further refills. 3 Inhaler 3 11/03/2019 at Unknown time  . cetirizine (ZYRTEC)  10 MG tablet Take 10 mg by mouth at bedtime.    11/02/2019 at Unknown time  . fluticasone (FLONASE) 50 MCG/ACT nasal spray Place 1 spray into both nostrils 2 (two) times daily as needed for allergies or rhinitis. 48 g 3 11/03/2019 at Unknown time  . Homeopathic Products (LEG CRAMP RELIEF PO) Take 2 tablets by mouth at bedtime. Hyland's   11/02/2019 at Unknown time  . levothyroxine (SYNTHROID) 125 MCG tablet Take 1 tablet (125 mcg total) by mouth daily before breakfast. 90 tablet 3 11/03/2019 at Unknown time  . losartan-hydrochlorothiazide (HYZAAR) 50-12.5 MG tablet Take 1 tablet by mouth daily. 90 tablet 3 11/03/2019 at Unknown time  . magnesium oxide (MAG-OX) 400 MG tablet Take 400 mg by mouth at bedtime.    11/02/2019 at Unknown time  . meloxicam (MOBIC) 15 MG tablet Take 1 tablet (15 mg total) by mouth daily. 90 tablet 3 11/03/2019 at Unknown time  . omeprazole (PRILOSEC) 40 MG capsule Take 1 capsule (40 mg total) by mouth 2 (two) times daily. 180 capsule 3 11/03/2019 at Unknown time  . PRESCRIPTION MEDICATION Oxygen 3.0 liters at night, prn use in day     . Respiratory Therapy Supplies (FLUTTER) DEVI Use three times daily 1 each 0   . sucralfate (CARAFATE) 1 g tablet TAKE 1 TABLET BY MOUTH 4 TIMES DAILY (WITH MEALS AND AT BEDTIME) (Patient taking differently: Take 1 g by mouth as needed (ulcers). ) 120 tablet 2 11/02/2019 at Unknown time  . SUPER B COMPLEX/C PO Take 1 capsule by mouth at bedtime.   11/02/2019 at Unknown time  . traMADol (ULTRAM) 50 MG tablet Take 1 tablet (50 mg total) by mouth every 8 (eight) hours as needed. 90 tablet 1 Past Week at Unknown time  . sucralfate (CARAFATE) 1 GM/10ML suspension Take 10 mLs (1 g total) by mouth 4 (four) times daily -  with meals and at bedtime. (Patient not taking: Reported on 11/03/2019) 120 mL 0 Not Taking at Unknown time    Inpatient Medications:  . atorvastatin  40 mg Oral Daily  . clopidogrel  75 mg Oral Daily  . fluticasone furoate-vilanterol  1 puff Inhalation  Daily  . heparin  5,000 Units Subcutaneous Q12H  . levothyroxine  125 mcg Oral QAC breakfast  . loratadine  10 mg Oral Daily  . magnesium oxide  400 mg Oral QHS  . meloxicam  15 mg Oral Daily  . pantoprazole  40 mg Oral QAC breakfast  . sucralfate  1 g Oral TID WC & HS    Allergies:  Allergies  Allergen Reactions  . Nuts Nausea And Vomiting    sweating  . Other Nausea And Vomiting    Per patient, cannot take any narcotic. Nausea and vomiting sweating  . Vicodin [Hydrocodone-Acetaminophen]     tachycardia  . Codeine   . Aspirin Other (See Comments)    Upset stomach, bleeding  . Penicillins Hives and Rash  Has patient had a PCN reaction causing immediate rash, facial/tongue/throat swelling, SOB or lightheadedness with hypotension:No Has patient had a PCN reaction causing severe rash involving mucus membranes or skin necrosis:No Has patient had a PCN reaction that required hospitalization:No Has patient had a PCN reaction occurring within the last 10 years:Yes If all of the above answers are "NO", then may proceed with Cephalosporin use.     Social History   Socioeconomic History  . Marital status: Married    Spouse name: Not on file  . Number of children: 2  . Years of education: 66  . Highest education level: 11th grade  Occupational History  . 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 Smoking status: Former Smoker    Packs/day: 1.00    Years: 50.00    Pack years: 50.00    Types: Cigarettes    Quit date: 07/30/2010    Years since quitting: 9.2  . Smokeless tobacco: Current User    Types: Snuff  Vaping Use  . Vaping Use: Never used  Substance and Sexual Activity  . Alcohol use: Yes    Comment: rare  . Drug use: No  . Sexual activity: Not on file  Other Topics Concern  . Not on file  Social History Narrative  . Not on file   Social Determinants of Health   Financial Resource Strain:     . Difficulty of Paying Living Expenses:   Food Insecurity:   . Worried About Charity fundraiser in the Last Year:   . Arboriculturist in the Last Year:   Transportation Needs:   . Film/video editor (Medical):   Marland Kitchen Lack of Transportation (Non-Medical):   Physical Activity: Sufficiently Active  . Days of Exercise per Week: 3 days  . Minutes of Exercise per Session: 130 min  Stress:   . Feeling of Stress :   Social Connections:   . Frequency of Communication with Friends and Family:   . Frequency of Social Gatherings with Friends and Family:   . Attends Religious Services:   . Active Member of Clubs or Organizations:   . Attends Archivist Meetings:   Marland Kitchen Marital Status:   Intimate Partner Violence:   . Fear of Current or Ex-Partner:   . Emotionally Abused:   Marland Kitchen Physically Abused:   . Sexually Abused:      Family History  Problem Relation Age of Onset  . Arthritis Father   . Stroke Father 26  . Prostate cancer Maternal Grandfather   . Stomach cancer Maternal Grandmother   . Cancer Paternal Grandmother   . Colon cancer Other        mat 1st cousin  . Cancer Mother   . Healthy Daughter   . Healthy Son   . Pancreatic cancer Neg Hx   . Esophageal cancer Neg Hx       Review of Systems: All other systems reviewed and are otherwise negative except as noted above.  Physical Exam: Vitals:   11/04/19 2000 11/04/19 2333 11/05/19 0342 11/05/19 0755  BP: 112/88 (!) 143/105 (!) 143/84 (!) 142/91  Pulse: 98 81 76 74  Resp: 18 18 18 18   Temp: 98.2 F (36.8 C) 98.5 F (36.9 C) 97.9 F (36.6 C) 98.8 F (37.1 C)  TempSrc: Oral Oral Oral Oral  SpO2: 95% 96% 96% 97%  Weight:      Height:  GEN- The patient is well appearing, alert and oriented x 3 today.   Head- normocephalic, atraumatic Eyes-  Sclera clear, conjunctiva pink Ears- hearing intact Oropharynx- clear Neck- supple Lungs- Clear to ausculation bilaterally, normal work of breathing Heart-  Regular rate and rhythm, no murmurs, rubs or gallops  GI- soft, NT, ND, + BS Extremities- no clubbing, cyanosis, or edema MS- no significant deformity or atrophy Skin- no rash or lesion Psych- euthymic mood, full affect   Labs:   Lab Results  Component Value Date   WBC 10.9 (H) 11/03/2019   HGB 17.0 11/03/2019   HCT 50.0 11/03/2019   MCV 94.8 11/03/2019   PLT 195 11/03/2019    Recent Labs  Lab 11/03/19 1140 11/03/19 1157 11/05/19 0423  NA 138   < > 139  K 3.4*   < > 3.4*  CL 101   < > 102  CO2 25   < > 26  BUN 12   < > 10  CREATININE 1.71*   < > 1.58*  CALCIUM 9.4   < > 9.7  PROT 7.2  --   --   BILITOT 0.8  --   --   ALKPHOS 64  --   --   ALT 27  --   --   AST 25  --   --   GLUCOSE 117*   < > 117*   < > = values in this interval not displayed.     Radiology/Studies: CT HEAD WO CONTRAST  Result Date: 11/03/2019 CLINICAL DATA:  Possible stroke; neuro deficit, acute, stroke suspected; head trauma, focal neuro findings. Additional history provided: Unable to move left arm, tingling in left thigh and left face. EXAM: CT HEAD WITHOUT CONTRAST TECHNIQUE: Contiguous axial images were obtained from the base of the skull through the vertex without intravenous contrast. COMPARISON:  Noncontrast head CT 02/18/2015 FINDINGS: Brain: Mild generalized parenchymal atrophy. Redemonstrated small chronic lacunar infarct within the right caudate nucleus. A chronic lacunar infarct within the left lentiform nucleus/external capsule is well demarcated and appears chronic, although is new as compared to prior head CT 02/18/2015. A 9 mm infarct within the right cerebellum was not present on the prior CT and is age indeterminate. Redemonstrated small chronic infarct within the left cerebellum. Background mild ill-defined hypoattenuation within the cerebral white matter is nonspecific, but consistent with chronic small vessel ischemic disease. There is no acute intracranial hemorrhage. No acute  demarcated cortical infarct is identified. No extra-axial fluid collection. No evidence of intracranial mass. No midline shift. Vascular: No hyperdense vessel.  Atherosclerotic calcifications Skull: Normal. Negative for fracture or focal lesion. Sinuses/Orbits: Prior left maxillofacial reconstructive surgery. Visualized orbits show no acute finding. Mild ethmoid sinus mucosal thickening. No significant mastoid effusion. IMPRESSION: 9 mm infarct within the right cerebellum, new from prior head CT 02/18/2015 but otherwise age indeterminate. A lacunar infarct within the left basal ganglia/external capsule is also new from this prior exam but appears chronic. Redemonstrated small chronic infarcts within the right caudate nucleus and left cerebellum. Background mild generalized parenchymal atrophy and chronic small vessel ischemic disease. Mild ethmoid sinus mucosal thickening. Electronically Signed   By: Kellie Simmering DO   On: 11/03/2019 12:14   MR ANGIO HEAD WO CONTRAST  Result Date: 11/03/2019 CLINICAL DATA:  Abnormal CT, stroke suspected EXAM: MRI HEAD WITHOUT CONTRAST MRA HEAD WITHOUT CONTRAST TECHNIQUE: Multiplanar, multiecho pulse sequences of the brain and surrounding structures were obtained without intravenous contrast. Angiographic images of the head were obtained  using MRA technique without contrast. COMPARISON:  None. FINDINGS: MRI HEAD Brain: There is a subcentimeter focus of minimally reduced diffusion in the right cerebellar hemisphere. Additional 12 mm focus of reduced diffusion in the right corona radiata. There are small chronic infarcts of the right caudate, left corona radiata, and left cerebellum. Additional patchy T2 hyperintensity in the supratentorial and pontine white matter is nonspecific but may reflect mild to moderate chronic microvascular ischemic changes. Prominence of the ventricles and sulci reflects minor generalized parenchymal volume loss. There is no intracranial mass, mass effect,  or edema. There is no hydrocephalus or extra-axial fluid collection. Vascular: Major vessel flow voids at the skull base are preserved. Skull and upper cervical spine: Normal marrow signal is preserved. Sinuses/Orbits: Paranasal sinuses are aerated. Right lens replacement. Other: Sella is unremarkable.  Mastoid air cells are clear. MRA HEAD Intracranial internal carotid arteries are patent. Middle and anterior cerebral arteries are patent. Intracranial vertebral arteries, basilar artery, posterior cerebral arteries are patent. Bilateral posterior communicating arteries are present with fetal or near fetal origin of the posterior cerebral arteries. There is no significant stenosis or aneurysm. IMPRESSION: Small acute infarct of the right corona radiata. Smaller acute to subacute infarct of the right cerebellar hemisphere. Mild to moderate chronic microvascular ischemic changes. Few chronic small infarcts as detailed above. No proximal intracranial vessel occlusion or significant stenosis. Electronically Signed   By: Macy Mis M.D.   On: 11/03/2019 16:00   MR BRAIN WO CONTRAST  Result Date: 11/03/2019 CLINICAL DATA:  Abnormal CT, stroke suspected EXAM: MRI HEAD WITHOUT CONTRAST MRA HEAD WITHOUT CONTRAST TECHNIQUE: Multiplanar, multiecho pulse sequences of the brain and surrounding structures were obtained without intravenous contrast. Angiographic images of the head were obtained using MRA technique without contrast. COMPARISON:  None. FINDINGS: MRI HEAD Brain: There is a subcentimeter focus of minimally reduced diffusion in the right cerebellar hemisphere. Additional 12 mm focus of reduced diffusion in the right corona radiata. There are small chronic infarcts of the right caudate, left corona radiata, and left cerebellum. Additional patchy T2 hyperintensity in the supratentorial and pontine white matter is nonspecific but may reflect mild to moderate chronic microvascular ischemic changes. Prominence of the  ventricles and sulci reflects minor generalized parenchymal volume loss. There is no intracranial mass, mass effect, or edema. There is no hydrocephalus or extra-axial fluid collection. Vascular: Major vessel flow voids at the skull base are preserved. Skull and upper cervical spine: Normal marrow signal is preserved. Sinuses/Orbits: Paranasal sinuses are aerated. Right lens replacement. Other: Sella is unremarkable.  Mastoid air cells are clear. MRA HEAD Intracranial internal carotid arteries are patent. Middle and anterior cerebral arteries are patent. Intracranial vertebral arteries, basilar artery, posterior cerebral arteries are patent. Bilateral posterior communicating arteries are present with fetal or near fetal origin of the posterior cerebral arteries. There is no significant stenosis or aneurysm. IMPRESSION: Small acute infarct of the right corona radiata. Smaller acute to subacute infarct of the right cerebellar hemisphere. Mild to moderate chronic microvascular ischemic changes. Few chronic small infarcts as detailed above. No proximal intracranial vessel occlusion or significant stenosis. Electronically Signed   By: Macy Mis M.D.   On: 11/03/2019 16:00   EEG adult  Result Date: 11/04/2019 Alexis Goodell, MD     11/04/2019  5:51 PM ELECTROENCEPHALOGRAM REPORT Patient: DEVANTAE BABE       Room #: 6L87F EEG No. ID: [id number] Age: 74 y.o.        Sex: male Requesting Physician: Reesa Chew  Report Date:  11/04/2019       Interpreting Physician: Alexis Goodell History: KIEREN ADKISON is an 74 y.o. male with LUE weakness and difficulty with gait Medications: Plavix, Synthroid, Mobic, Lipitor Conditions of Recording:  This is a 21 channel routine scalp EEG performed with bipolar and monopolar montages arranged in accordance to the international 10/20 system of electrode placement. One channel was dedicated to EKG recording. The patient is in the awake and drowsy states. Description:  The waking background  activity consists of a low voltage, symmetrical, fairly well organized, 8-9 Hz alpha activity, seen from the parieto-occipital and posterior temporal regions.  Low voltage fast activity, poorly organized, is seen anteriorly and is at times superimposed on more posterior regions.  A mixture of theta and alpha rhythms are seen from the central and temporal regions. The patient drowses with slowing to irregular, low voltage theta and beta activity.  Stage II sleep is not obtained. No epileptiform activity is noted.  Hyperventilation was not performed.  Intermittent photic stimulation was performed but failed to illicit any change in the tracing. IMPRESSION: Normal electroencephalogram, awake, drowsy and with activation procedures. There are no focal lateralizing or epileptiform features. Alexis Goodell, MD Neurology [phone] 11/04/2019, 5:48 PM   ECHOCARDIOGRAM COMPLETE  Result Date: 11/04/2019    ECHOCARDIOGRAM REPORT   Patient Name:   PRINCE COUEY Date of Exam: 11/04/2019 Medical Rec #:  [id number]        Height:       69.0 in Accession #:    [id number]       Weight:       204.6 lb Date of Birth:  11-May-1945         BSA:          2.086 m Patient Age:    74 years         BP:           110/97 mmHg Patient Gender: M                HR:           98 bpm. Exam Location:  Inpatient Procedure: 2D Echo Indications:    TIA  History:        Patient has prior history of Echocardiogram examinations, most                 recent 02/28/2015. COPD and Stroke; Risk Factors:Hypertension                 and Former Smoker. Bronchiolitis obliterans organizing pneumonia                 (BOOP). Dysrhythmia. tobacco dependence.  Sonographer:    Jannett Celestine RDCS (AE) Referring Phys: [id number] Lequita Halt  Sonographer Comments: No parasternal window, suboptimal apical window and Technically difficult study due to poor echo windows. Image acquisition challenging due to COPD, Image acquisition challenging due to respiratory motion and  restriced mobility. see  comments IMPRESSIONS  1. Limited echo no para sternal windows.  2. Left ventricular ejection fraction, by estimation, is 60 to 65%. The left ventricle has normal function. The left ventricle has no regional wall motion abnormalities. Left ventricular diastolic parameters are indeterminate.  3. Right ventricular systolic function is normal. The right ventricular size is normal.  4. The mitral valve is normal in structure. not seen well mitral valve regurgitation. No evidence of mitral stenosis.  5. Tricuspid valve regurgitation not seen well.  6. The  aortic valve was not well visualized. Aortic valve regurgitation not seen well. not seen well.  7. The inferior vena cava is normal in size with greater than 50% respiratory variability, suggesting right atrial pressure of 3 mmHg. FINDINGS  Left Ventricle: Left ventricular ejection fraction, by estimation, is 60 to 65%. The left ventricle has normal function. The left ventricle has no regional wall motion abnormalities. Definity contrast agent was given IV to delineate the left ventricular  endocardial borders. The left ventricular internal cavity size was normal in size. There is no left ventricular hypertrophy. Left ventricular diastolic parameters are indeterminate. Right Ventricle: The right ventricular size is normal. No increase in right ventricular wall thickness. Right ventricular systolic function is normal. Left Atrium: Left atrial size was normal in size. Right Atrium: Right atrial size was normal in size. Pericardium: There is no evidence of pericardial effusion. Mitral Valve: The mitral valve is normal in structure. Normal mobility of the mitral valve leaflets. Not seen well mitral valve regurgitation. No evidence of mitral valve stenosis. Tricuspid Valve: The tricuspid valve is not well visualized. Tricuspid valve regurgitation not seen well. No evidence of tricuspid stenosis. Aortic Valve: The aortic valve was not well visualized.  Aortic valve regurgitation not seen well. Not seen well. Pulmonic Valve: The pulmonic valve was normal in structure. Pulmonic valve regurgitation is not visualized. No evidence of pulmonic stenosis. Aorta: The aortic root is normal in size and structure. Venous: The inferior vena cava is normal in size with greater than 50% respiratory variability, suggesting right atrial pressure of 3 mmHg. IAS/Shunts: The interatrial septum was not well visualized. Additional Comments: Limited echo no para sternal windows.   Diastology LV e' medial: 5.33 cm/s  AORTIC VALVE LVOT Vmax:   57.40 cm/s LVOT Vmean:  37.600 cm/s LVOT VTI:    0.104 m  SHUNTS Systemic VTI: 0.10 m Jenkins Rouge MD Electronically signed by Jenkins Rouge MD Signature Date/Time: 11/04/2019/4:28:59 PM    Final    VAS US CAROTID  Result Date: 11/04/2019 Carotid Arterial Duplex Study Indications:       Carotid stenosis. Risk Factors:      Hypertension. Comparison Study:  no prior Performing Technologist: Abram Sander RVS  Examination Guidelines: A complete evaluation includes B-mode imaging, spectral Doppler, color Doppler, and power Doppler as needed of all accessible portions of each vessel. Bilateral testing is considered an integral part of a complete examination. Limited examinations for reoccurring indications may be performed as noted.  Right Carotid Findings: +----------+--------+--------+--------+------------------+--------+           PSV cm/sEDV cm/sStenosisPlaque DescriptionComments +----------+--------+--------+--------+------------------+--------+ CCA Prox  62      17              heterogenous               +----------+--------+--------+--------+------------------+--------+ CCA Distal68      19              heterogenous               +----------+--------+--------+--------+------------------+--------+ ICA Prox  46      14      1-39%   heterogenous                +----------+--------+--------+--------+------------------+--------+ ICA Distal95      30                                         +----------+--------+--------+--------+------------------+--------+  ECA       69      17                                         +----------+--------+--------+--------+------------------+--------+ +----------+--------+-------+--------+-------------------+           PSV cm/sEDV cmsDescribeArm Pressure (mmHG) +----------+--------+-------+--------+-------------------+ [id number]                                         +----------+--------+-------+--------+-------------------+ +---------+--------+--------+------------+ VertebralPSV cm/sEDV cm/sNot assessed +---------+--------+--------+------------+  Left Carotid Findings: +----------+--------+--------+--------+------------------+--------+           PSV cm/sEDV cm/sStenosisPlaque DescriptionComments +----------+--------+--------+--------+------------------+--------+ CCA Prox  61      21              heterogenous               +----------+--------+--------+--------+------------------+--------+ CCA Distal52      14              heterogenous               +----------+--------+--------+--------+------------------+--------+ ICA Prox  46      22      1-39%   heterogenous               +----------+--------+--------+--------+------------------+--------+ ICA Distal54      19                                         +----------+--------+--------+--------+------------------+--------+ ECA       65      21                                         +----------+--------+--------+--------+------------------+--------+ +----------+--------+--------+--------+-------------------+           PSV cm/sEDV cm/sDescribeArm Pressure (mmHG) +----------+--------+--------+--------+-------------------+ [id number]                                          +----------+--------+--------+--------+-------------------+ +---------+--------+--------+------------+ VertebralPSV cm/sEDV cm/sNot assessed +---------+--------+--------+------------+   Summary: Right Carotid: Velocities in the right ICA are consistent with a 1-39% stenosis. Left Carotid: Velocities in the left ICA are consistent with a 1-39% stenosis. Vertebrals: Bilateral vertebral arteries were not visualized. *See table(s) above for measurements and observations.  Electronically signed by Antony Contras MD on 11/04/2019 at 12:52:48 PM.    Final     12-lead ECG on admission NSR 76, normal intervals (personally reviewed) All prior EKG's in EPIC reviewed with no documented atrial fibrillation  Telemetry NSR 70-90s (personally reviewed)  Assessment and Plan:  1. Cryptogenic stroke The patient presents with cryptogenic stroke.  The patient does not have a TEE planned for this AM.  I spoke at length with the patient about monitoring for afib with an implantable loop recorder.  Risks, benefits, and alteratives to implantable loop recorder were discussed with the patient today.   At this time, the patient is very clear in their decision to proceed with implantable loop recorder.   2. H/o near syncope/chest fullness Seen 2016 by Dr. Angelena Form Pt  wore holter 01/2015 that showed sinus bradycardia, PACs, and PVCs Echo normal at that time so no ischemic work up (normal cath 2002 per notes)  3. Hypokalemia K 3.4. Stable at baseline. Would give 40 meq x 1 and follow as outpatient with PCP  Wound care was reviewed with the patient (keep incision clean and dry for 3 days).  Wound check scheduled and entered in AVS. Please call with questions.   Shirley Friar, PA-C 11/05/2019 8:59 AM   I have seen, examined the patient, and reviewed the above assessment and plan.  Changes to above are made where necessary.  On exam, RRR.  The patient presented with stroke of unknown cause.  I agree with dr Leonie Man  that long term monitoring is appropriate to evaluate for afib as the cause of his stroke. Risks and benefits to ILR were discussed with the patient who wishes to proceed.  He understands that risks include but are not to bleeding and infection.  Co Sign: Thompson Grayer, MD 11/05/2019 11:51 AM

## 2019-11-05 NOTE — Progress Notes (Signed)
STROKE TEAM PROGRESS NOTE   INTERVAL HISTORY He is sitting up in bed.  His states his left upper extremity weakness appears to be improving.  Vital signs are stable..  Vitals:   11/04/19 2333 11/05/19 0342 11/05/19 0755 11/05/19 1450  BP: (!) 143/105 (!) 143/84 (!) 142/91 (!) 156/91  Pulse: 81 76 74 91  Resp: 18 18 18 18   Temp: 98.5 F (36.9 C) 97.9 F (36.6 C) 98.8 F (37.1 C) 97.9 F (36.6 C)  TempSrc: Oral Oral Oral Oral  SpO2: 96% 96% 97%   Weight:      Height:        CBC:  Recent Labs  Lab 11/03/19 1140 11/03/19 1157  WBC 10.9*  --   NEUTROABS 6.0  --   HGB 17.5* 17.0  HCT 51.4 50.0  MCV 94.8  --   PLT 195  --    Basic Metabolic Panel:  Recent Labs  Lab 11/03/19 1140 11/03/19 1140 11/03/19 1157 11/05/19 0423  NA 138   < > 141 139  K 3.4*   < > 3.4* 3.4*  CL 101   < > 101 102  CO2 25  --   --  26  GLUCOSE 117*   < > 114* 117*  BUN 12   < > 15 10  CREATININE 1.71*   < > 1.70* 1.58*  CALCIUM 9.4  --   --  9.7  MG  --   --   --  2.1   < > = values in this interval not displayed.   Lipid Panel:  Recent Labs  Lab 11/03/19 1140  CHOL 99  TRIG 58  HDL 52  CHOLHDL 1.9  VLDL 12  LDLCALC 35   HgbA1c:  Recent Labs  Lab 11/04/19 0424  HGBA1C 5.7*   Urine Drug Screen: No results for input(s): LABOPIA, COCAINSCRNUR, LABBENZ, AMPHETMU, THCU, LABBARB in the last 168 hours.  Alcohol Level No results for input(s): ETH in the last 168 hours.  IMAGING past 24 hours EP PPM/ICD IMPLANT  Result Date: 11/05/2019 SURGEON:  Thompson Grayer, MD   PREPROCEDURE DIAGNOSIS:  Cryptogenic Stroke   POSTPROCEDURE DIAGNOSIS:  Cryptogenic Stroke    PROCEDURES:  1. Implantable loop recorder implantation   INTRODUCTION:  Antonio Bowen is a 74 y.o. male with a history of unexplained stroke who presents today for implantable loop implantation.  The patient has had a cryptogenic stroke.  Despite an extensive workup by neurology, no reversible causes have been identified.  he has  worn telemetry during which he did not have arrhythmias.  There is significant concern for possible atrial fibrillation as the cause for the patients stroke.  The patient therefore presents today for implantable loop implantation.   DESCRIPTION OF PROCEDURE:  Informed written consent was obtained.  The patient required no sedation for the procedure today.  The patients left chest was prepped and draped. Mapping over the patient's chest was performed to identify the appropriate ILR site.  This area was found to be the left parasternal region over the 3rd-4th intercostal space.  The skin overlying this region was infiltrated with lidocaine for local analgesia.  A 0.5-cm incision was made at the implant site.  A subcutaneous ILR pocket was fashioned using a combination of sharp and blunt dissection.  A Medtronic Reveal Linq model [id number] implantable loop recorder was then placed into the pocket R waves were very prominent and measured > 0.2 mV. EBL<1 ml.  Steri- Strips and a sterile dressing  were then applied.  There were no early apparent complications.   CONCLUSIONS:  1. Successful implantation of a Medtronic Reveal LINQ implantable loop recorder for cryptogenic stroke  2. No early apparent complications. Thompson Grayer MD, [facility] 11/05/2019 12:33 PM   EEG adult  Result Date: 11/04/2019 Alexis Goodell, MD     11/04/2019  5:51 PM ELECTROENCEPHALOGRAM REPORT Patient: Antonio Bowen       Room #: 0N02V EEG No. ID: [id number] Age: 74 y.o.        Sex: male Requesting Physician: Reesa Chew Report Date:  11/04/2019       Interpreting Physician: Alexis Goodell History: Antonio Bowen is an 74 y.o. male with LUE weakness and difficulty with gait Medications: Plavix, Synthroid, Mobic, Lipitor Conditions of Recording:  This is a 21 channel routine scalp EEG performed with bipolar and monopolar montages arranged in accordance to the international 10/20 system of electrode placement. One channel was dedicated to EKG recording. The patient  is in the awake and drowsy states. Description:  The waking background activity consists of a low voltage, symmetrical, fairly well organized, 8-9 Hz alpha activity, seen from the parieto-occipital and posterior temporal regions.  Low voltage fast activity, poorly organized, is seen anteriorly and is at times superimposed on more posterior regions.  A mixture of theta and alpha rhythms are seen from the central and temporal regions. The patient drowses with slowing to irregular, low voltage theta and beta activity.  Stage II sleep is not obtained. No epileptiform activity is noted.  Hyperventilation was not performed.  Intermittent photic stimulation was performed but failed to illicit any change in the tracing. IMPRESSION: Normal electroencephalogram, awake, drowsy and with activation procedures. There are no focal lateralizing or epileptiform features. Alexis Goodell, MD Neurology [phone] 11/04/2019, 5:48 PM    PHYSICAL EXAM Pleasant elderly Caucasian male not in distress. . Afebrile. Head is nontraumatic. Neck is supple without bruit.    Cardiac exam no murmur or gallop. Lungs are clear to auscultation. Distal pulses are well felt. Neurological Exam ;  Awake  Alert oriented x 3. Normal speech and language.eye movements full without nystagmus.fundi were not visualized. Vision acuity and fields appear normal. Hearing is normal. Palatal movements are normal.  Mild left lower facial asymmetry.  Tongue midline. Normal strength, tone, reflexes and coordination.  Except left upper extremity 3/5 weakness with hand and grip weakness with diminished fine finger movements and orbits right over left upper extremity.  Diminished touch Pinprick left upper extremity sensation. Gait deferred.  ASSESSMENT/PLAN [name] is a 75 y.o. male with history of tobacco abuse, stroke, low back pain, hypertension, dysrhythmia, arthritis presenting with L arm weakness and numbness.   Stroke:   R corona radiata and  R cerebellar infarcts in 2 different vascular territories, infarcts embolic secondary to unknown source  CT head age indeterminate R cerebellar infarct new from 02/18/2015. New chronic L basal ganglia lacune. Old small R caudate nucleus and L cerebellar infarcts. Small vessel disease. Atrophy. Sinus dz.  MRI  Small R corona radiata infarct. Few acute/subacute R cerebellar infarcts. Small vessel disease.   MRA  Unremarkable   Carotid Doppler  B ICA 1-39% stenosis, VAs antegrade   2D Echo normal ejection fraction 60 to 65%.  No wall motion abnormalities.    Loop recorder inserted 11/05/2019  LDL 35  HgbA1c 5.7  VTE prophylaxis - Heparin 5000 units sq tid   No antithrombotic prior to admission (no aspirin as on Meloxicam for arthritis),  now on clopidogrel 75 mg daily. Listed allergy to aspirin of "upset stomach, bleeding". Dr. Leonie Man recommends DAPT x 3 weeks then plavix alone if pt can tolerate.     Therapy recommendations:  OP PT, no SLP  Disposition:  Return home  Hypertension  As high as 165/113 on admission   Stable now  . Permissive hypertension (OK if < 220/120) but gradually normalize in 5-7 days . Long-term BP goal normotensive  Hyperlipidemia  Home meds:  No listed statin, though LDL very low for pt not on statin  Now on lipitor 40  LDL 35, goal < 70  Continue statin at discharge  Other Stroke Risk Factors  Advanced age  Former Cigarette smoker, quit 9 yrs ago  ETOH use, advised to drink no more than 2 drink(s) a day  Obesity, Body mass index is 30.21 kg/m., recommend weight loss, diet and exercise as appropriate   Hx stroke/TIA seen on imaging.   Family hx stroke (father)  Other Active Problems  COPD on home [facility] day # 1  He presented with left upper extremity weakness secondary to right brain subcortical infarct but MRI also shows cerebellar infarct.   dual antiplatelet therapy of aspirin Plavix for 3 weeks followed by Plavix alone.   Aggressive risk factor modification.  Follow-up as an outpatient stroke clinic in 6 weeks.  Stroke team will sign off.  D/W Dr Reesa Chew Antony Contras, MD To contact Stroke Continuity provider, please refer to [email or web address] After hours, contact General Neurology

## 2019-11-05 NOTE — Progress Notes (Signed)
PT Cancellation Note  Patient Details Name: Antonio Bowen MRN: [id number] DOB: 1945/06/04   Cancelled Treatment:    Reason Eval/Treat Not Completed: Discussed pt case with OT who saw pt today. As pt's main deficits are in LUE and is ambulating at/near baseline, opted to hold PT session this date as OT provided HEP and education to the pt/family earlier today. Will continue to follow.    Thelma Comp 11/05/2019, 3:21 PM   Rolinda Roan, PT, DPT Acute Rehabilitation Services Pager: [phone] Office: [phone]

## 2019-11-05 NOTE — Consult Note (Addendum)
ELECTROPHYSIOLOGY CONSULT NOTE  Patient ID: Antonio Bowen MRN: [id number], DOB/AGE: 01/06/1946   Admit date: 11/03/2019 Date of Consult: 11/05/2019  Primary Physician: [name], [name], MD Primary Cardiologist: No primary care provider on file.  Primary Electrophysiologist: New to Dr. Rayann Heman Reason for Consultation: Cryptogenic stroke; recommendations regarding Implantable Loop Recorder Insurance: [employer]  History of Present Illness EP has been asked to evaluate Antonio Bowen for placement of an implantable loop recorder to monitor for atrial fibrillation by Dr Leonie Man.  The patient was admitted on 11/03/2019 with sudden LUE weakness and numbness outside of tPA window.   Imaging demonstrated acute R corona radiata and R cerebella infarcts in 2 different vascular territories.  They have undergone workup for stroke including echocardiogram and carotid dopplers.  The patient has been monitored on telemetry which has demonstrated sinus rhythm with no arrhythmias.  Inpatient stroke work-up will not require a TEE per Neurology.   Echocardiogram this admission demonstrated 60-65%.  Lab work is reviewed.  Prior to admission, the patient denies chest pain, shortness of breath, dizziness,  or syncope. He has infrequent palpitations, once or twice a year. Previous Holter showed PACs/PVCs as below. They are recovering from their stroke with plans to return home  at discharge.  Past Medical History:  Diagnosis Date  . Allergic rhinitis   . Arthritis   . BOOP (bronchiolitis obliterans with organizing pneumonia) ([place])   . Bronchitis   . Colon polyps   . COPD (chronic obstructive pulmonary disease) ([place])   . Dyspnea    with exertion   . Dysrhythmia    skipped beats- followed by Dr Angelena Form   . GERD (gastroesophageal reflux disease)   . Hypertension   . Hypothyroidism   . Kidney stone   . Low back pain   . Oxygen dependent    uses 3L/[place] at nite occasional during day if needed   .  Stroke [facility] [place])    hx of 2 strokes which patient did not know he had had   . Thyroid disease   . Tobacco dependence      Surgical History:  Past Surgical History:  Procedure Laterality Date  . BIOPSY  08/18/2019   Procedure: BIOPSY;  Surgeon: Irene Shipper, MD;  Location: [facility];  Service: Endoscopy;;  . broncoscopy     . carpel tunnal     left  . COLONOSCOPY     x 4  . DUPUYTREN CONTRACTURE RELEASE     Left  . ESOPHAGOGASTRODUODENOSCOPY (EGD) WITH PROPOFOL N/A 03/26/2016   Procedure: ESOPHAGOGASTRODUODENOSCOPY (EGD) WITH PROPOFOL;  Surgeon: Irene Shipper, MD;  Location: [facility];  Service: Endoscopy;  Laterality: N/A;  . ESOPHAGOGASTRODUODENOSCOPY (EGD) WITH PROPOFOL N/A 08/18/2019   Procedure: ESOPHAGOGASTRODUODENOSCOPY (EGD) WITH PROPOFOL;  Surgeon: Irene Shipper, MD;  Location: [facility];  Service: Endoscopy;  Laterality: N/A;  Possible dilation  . ESOPHAGOGASTRODUODENOSCOPY ENDOSCOPY     x 3  . FACIAL FRACTURE SURGERY    . OTHER SURGICAL HISTORY     facial surgery  . SAVORY DILATION N/A 08/18/2019   Procedure: SAVORY DILATION;  Surgeon: Irene Shipper, MD;  Location: [facility];  Service: Endoscopy;  Laterality: N/A;  . TOTAL HIP ARTHROPLASTY  01/2011   Right hip, [place]  . ULNAR NERVE TRANSPOSITION     left     Medications Prior to Admission  Medication Sig Dispense Refill Last Dose  . acetaminophen (TYLENOL) 500 MG tablet Take 1,000 mg by mouth 2 (two) times  daily as needed (for pain.).   unknown  . albuterol (VENTOLIN HFA) 108 (90 Base) MCG/ACT inhaler Inhale 1-2 puffs into the lungs every 4 (four) hours as needed for wheezing or shortness of breath. INHALE TWO PUFFS INTO LUNGS EVERY 6 HOURS AS NEEDED FOR WHEEZING 18 g 3 11/03/2019 at Unknown time  . budesonide-formoterol (SYMBICORT) 160-4.5 MCG/ACT inhaler Inhale 2 puffs into the lungs in the morning and at bedtime. Needs to be seen for further refills. 3 Inhaler 3 11/03/2019 at Unknown time  . cetirizine (ZYRTEC)  10 MG tablet Take 10 mg by mouth at bedtime.    11/02/2019 at Unknown time  . fluticasone (FLONASE) 50 MCG/ACT nasal spray Place 1 spray into both nostrils 2 (two) times daily as needed for allergies or rhinitis. 48 g 3 11/03/2019 at Unknown time  . Homeopathic Products (LEG CRAMP RELIEF PO) Take 2 tablets by mouth at bedtime. Hyland's   11/02/2019 at Unknown time  . levothyroxine (SYNTHROID) 125 MCG tablet Take 1 tablet (125 mcg total) by mouth daily before breakfast. 90 tablet 3 11/03/2019 at Unknown time  . losartan-hydrochlorothiazide (HYZAAR) 50-12.5 MG tablet Take 1 tablet by mouth daily. 90 tablet 3 11/03/2019 at Unknown time  . magnesium oxide (MAG-OX) 400 MG tablet Take 400 mg by mouth at bedtime.    11/02/2019 at Unknown time  . meloxicam (MOBIC) 15 MG tablet Take 1 tablet (15 mg total) by mouth daily. 90 tablet 3 11/03/2019 at Unknown time  . omeprazole (PRILOSEC) 40 MG capsule Take 1 capsule (40 mg total) by mouth 2 (two) times daily. 180 capsule 3 11/03/2019 at Unknown time  . PRESCRIPTION MEDICATION Oxygen 3.0 liters at night, prn use in day     . Respiratory Therapy Supplies (FLUTTER) DEVI Use three times daily 1 each 0   . sucralfate (CARAFATE) 1 g tablet TAKE 1 TABLET BY MOUTH 4 TIMES DAILY (WITH MEALS AND AT BEDTIME) (Patient taking differently: Take 1 g by mouth as needed (ulcers). ) 120 tablet 2 11/02/2019 at Unknown time  . SUPER B COMPLEX/C PO Take 1 capsule by mouth at bedtime.   11/02/2019 at Unknown time  . traMADol (ULTRAM) 50 MG tablet Take 1 tablet (50 mg total) by mouth every 8 (eight) hours as needed. 90 tablet 1 Past Week at Unknown time  . sucralfate (CARAFATE) 1 GM/10ML suspension Take 10 mLs (1 g total) by mouth 4 (four) times daily -  with meals and at bedtime. (Patient not taking: Reported on 11/03/2019) 120 mL 0 Not Taking at Unknown time    Inpatient Medications:  . atorvastatin  40 mg Oral Daily  . clopidogrel  75 mg Oral Daily  . fluticasone furoate-vilanterol  1 puff Inhalation  Daily  . heparin  5,000 Units Subcutaneous Q12H  . levothyroxine  125 mcg Oral QAC breakfast  . loratadine  10 mg Oral Daily  . magnesium oxide  400 mg Oral QHS  . meloxicam  15 mg Oral Daily  . pantoprazole  40 mg Oral QAC breakfast  . sucralfate  1 g Oral TID WC & HS    Allergies:  Allergies  Allergen Reactions  . Nuts Nausea And Vomiting    sweating  . Other Nausea And Vomiting    Per patient, cannot take any narcotic. Nausea and vomiting sweating  . Vicodin [Hydrocodone-Acetaminophen]     tachycardia  . Codeine   . Aspirin Other (See Comments)    Upset stomach, bleeding  . Penicillins Hives and Rash  Has patient had a PCN reaction causing immediate rash, facial/tongue/throat swelling, SOB or lightheadedness with hypotension:No Has patient had a PCN reaction causing severe rash involving mucus membranes or skin necrosis:No Has patient had a PCN reaction that required hospitalization:No Has patient had a PCN reaction occurring within the last 10 years:Yes If all of the above answers are "NO", then may proceed with Cephalosporin use.     Social History   Socioeconomic History  . Marital status: Married    Spouse name: Not on file  . Number of children: 2  . Years of education: 82  . Highest education level: 11th grade  Occupational History  . 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 Smoking status: Former Smoker    Packs/day: 1.00    Years: 50.00    Pack years: 50.00    Types: Cigarettes    Quit date: 07/30/2010    Years since quitting: 9.2  . Smokeless tobacco: Current User    Types: Snuff  Vaping Use  . Vaping Use: Never used  Substance and Sexual Activity  . Alcohol use: Yes    Comment: rare  . Drug use: No  . Sexual activity: Not on file  Other Topics Concern  . Not on file  Social History Narrative  . Not on file   Social Determinants of Health   Financial Resource Strain:     . Difficulty of Paying Living Expenses:   Food Insecurity:   . Worried About Charity fundraiser in the Last Year:   . Arboriculturist in the Last Year:   Transportation Needs:   . Film/video editor (Medical):   Marland Kitchen Lack of Transportation (Non-Medical):   Physical Activity: Sufficiently Active  . Days of Exercise per Week: 3 days  . Minutes of Exercise per Session: 130 min  Stress:   . Feeling of Stress :   Social Connections:   . Frequency of Communication with Friends and Family:   . Frequency of Social Gatherings with Friends and Family:   . Attends Religious Services:   . Active Member of Clubs or Organizations:   . Attends Archivist Meetings:   Marland Kitchen Marital Status:   Intimate Partner Violence:   . Fear of Current or Ex-Partner:   . Emotionally Abused:   Marland Kitchen Physically Abused:   . Sexually Abused:      Family History  Problem Relation Age of Onset  . Arthritis Father   . Stroke Father 22  . Prostate cancer Maternal Grandfather   . Stomach cancer Maternal Grandmother   . Cancer Paternal Grandmother   . Colon cancer Other        mat 1st cousin  . Cancer Mother   . Healthy Daughter   . Healthy Son   . Pancreatic cancer Neg Hx   . Esophageal cancer Neg Hx       Review of Systems: All other systems reviewed and are otherwise negative except as noted above.  Physical Exam: Vitals:   11/04/19 2000 11/04/19 2333 11/05/19 0342 11/05/19 0755  BP: 112/88 (!) 143/105 (!) 143/84 (!) 142/91  Pulse: 98 81 76 74  Resp: 18 18 18 18   Temp: 98.2 F (36.8 C) 98.5 F (36.9 C) 97.9 F (36.6 C) 98.8 F (37.1 C)  TempSrc: Oral Oral Oral Oral  SpO2: 95% 96% 96% 97%  Weight:      Height:  GEN- The patient is well appearing, alert and oriented x 3 today.   Head- normocephalic, atraumatic Eyes-  Sclera clear, conjunctiva pink Ears- hearing intact Oropharynx- clear Neck- supple Lungs- Clear to ausculation bilaterally, normal work of breathing Heart-  Regular rate and rhythm, no murmurs, rubs or gallops  GI- soft, NT, ND, + BS Extremities- no clubbing, cyanosis, or edema MS- no significant deformity or atrophy Skin- no rash or lesion Psych- euthymic mood, full affect   Labs:   Lab Results  Component Value Date   WBC 10.9 (H) 11/03/2019   HGB 17.0 11/03/2019   HCT 50.0 11/03/2019   MCV 94.8 11/03/2019   PLT 195 11/03/2019    Recent Labs  Lab 11/03/19 1140 11/03/19 1157 11/05/19 0423  NA 138   < > 139  K 3.4*   < > 3.4*  CL 101   < > 102  CO2 25   < > 26  BUN 12   < > 10  CREATININE 1.71*   < > 1.58*  CALCIUM 9.4   < > 9.7  PROT 7.2  --   --   BILITOT 0.8  --   --   ALKPHOS 64  --   --   ALT 27  --   --   AST 25  --   --   GLUCOSE 117*   < > 117*   < > = values in this interval not displayed.     Radiology/Studies: CT HEAD WO CONTRAST  Result Date: 11/03/2019 CLINICAL DATA:  Possible stroke; neuro deficit, acute, stroke suspected; head trauma, focal neuro findings. Additional history provided: Unable to move left arm, tingling in left thigh and left face. EXAM: CT HEAD WITHOUT CONTRAST TECHNIQUE: Contiguous axial images were obtained from the base of the skull through the vertex without intravenous contrast. COMPARISON:  Noncontrast head CT 02/18/2015 FINDINGS: Brain: Mild generalized parenchymal atrophy. Redemonstrated small chronic lacunar infarct within the right caudate nucleus. A chronic lacunar infarct within the left lentiform nucleus/external capsule is well demarcated and appears chronic, although is new as compared to prior head CT 02/18/2015. A 9 mm infarct within the right cerebellum was not present on the prior CT and is age indeterminate. Redemonstrated small chronic infarct within the left cerebellum. Background mild ill-defined hypoattenuation within the cerebral white matter is nonspecific, but consistent with chronic small vessel ischemic disease. There is no acute intracranial hemorrhage. No acute  demarcated cortical infarct is identified. No extra-axial fluid collection. No evidence of intracranial mass. No midline shift. Vascular: No hyperdense vessel.  Atherosclerotic calcifications Skull: Normal. Negative for fracture or focal lesion. Sinuses/Orbits: Prior left maxillofacial reconstructive surgery. Visualized orbits show no acute finding. Mild ethmoid sinus mucosal thickening. No significant mastoid effusion. IMPRESSION: 9 mm infarct within the right cerebellum, new from prior head CT 02/18/2015 but otherwise age indeterminate. A lacunar infarct within the left basal ganglia/external capsule is also new from this prior exam but appears chronic. Redemonstrated small chronic infarcts within the right caudate nucleus and left cerebellum. Background mild generalized parenchymal atrophy and chronic small vessel ischemic disease. Mild ethmoid sinus mucosal thickening. Electronically Signed   By: Kellie Simmering DO   On: 11/03/2019 12:14   MR ANGIO HEAD WO CONTRAST  Result Date: 11/03/2019 CLINICAL DATA:  Abnormal CT, stroke suspected EXAM: MRI HEAD WITHOUT CONTRAST MRA HEAD WITHOUT CONTRAST TECHNIQUE: Multiplanar, multiecho pulse sequences of the brain and surrounding structures were obtained without intravenous contrast. Angiographic images of the head were obtained  using MRA technique without contrast. COMPARISON:  None. FINDINGS: MRI HEAD Brain: There is a subcentimeter focus of minimally reduced diffusion in the right cerebellar hemisphere. Additional 12 mm focus of reduced diffusion in the right corona radiata. There are small chronic infarcts of the right caudate, left corona radiata, and left cerebellum. Additional patchy T2 hyperintensity in the supratentorial and pontine white matter is nonspecific but may reflect mild to moderate chronic microvascular ischemic changes. Prominence of the ventricles and sulci reflects minor generalized parenchymal volume loss. There is no intracranial mass, mass effect,  or edema. There is no hydrocephalus or extra-axial fluid collection. Vascular: Major vessel flow voids at the skull base are preserved. Skull and upper cervical spine: Normal marrow signal is preserved. Sinuses/Orbits: Paranasal sinuses are aerated. Right lens replacement. Other: Sella is unremarkable.  Mastoid air cells are clear. MRA HEAD Intracranial internal carotid arteries are patent. Middle and anterior cerebral arteries are patent. Intracranial vertebral arteries, basilar artery, posterior cerebral arteries are patent. Bilateral posterior communicating arteries are present with fetal or near fetal origin of the posterior cerebral arteries. There is no significant stenosis or aneurysm. IMPRESSION: Small acute infarct of the right corona radiata. Smaller acute to subacute infarct of the right cerebellar hemisphere. Mild to moderate chronic microvascular ischemic changes. Few chronic small infarcts as detailed above. No proximal intracranial vessel occlusion or significant stenosis. Electronically Signed   By: Macy Mis M.D.   On: 11/03/2019 16:00   MR BRAIN WO CONTRAST  Result Date: 11/03/2019 CLINICAL DATA:  Abnormal CT, stroke suspected EXAM: MRI HEAD WITHOUT CONTRAST MRA HEAD WITHOUT CONTRAST TECHNIQUE: Multiplanar, multiecho pulse sequences of the brain and surrounding structures were obtained without intravenous contrast. Angiographic images of the head were obtained using MRA technique without contrast. COMPARISON:  None. FINDINGS: MRI HEAD Brain: There is a subcentimeter focus of minimally reduced diffusion in the right cerebellar hemisphere. Additional 12 mm focus of reduced diffusion in the right corona radiata. There are small chronic infarcts of the right caudate, left corona radiata, and left cerebellum. Additional patchy T2 hyperintensity in the supratentorial and pontine white matter is nonspecific but may reflect mild to moderate chronic microvascular ischemic changes. Prominence of the  ventricles and sulci reflects minor generalized parenchymal volume loss. There is no intracranial mass, mass effect, or edema. There is no hydrocephalus or extra-axial fluid collection. Vascular: Major vessel flow voids at the skull base are preserved. Skull and upper cervical spine: Normal marrow signal is preserved. Sinuses/Orbits: Paranasal sinuses are aerated. Right lens replacement. Other: Sella is unremarkable.  Mastoid air cells are clear. MRA HEAD Intracranial internal carotid arteries are patent. Middle and anterior cerebral arteries are patent. Intracranial vertebral arteries, basilar artery, posterior cerebral arteries are patent. Bilateral posterior communicating arteries are present with fetal or near fetal origin of the posterior cerebral arteries. There is no significant stenosis or aneurysm. IMPRESSION: Small acute infarct of the right corona radiata. Smaller acute to subacute infarct of the right cerebellar hemisphere. Mild to moderate chronic microvascular ischemic changes. Few chronic small infarcts as detailed above. No proximal intracranial vessel occlusion or significant stenosis. Electronically Signed   By: Macy Mis M.D.   On: 11/03/2019 16:00   EEG adult  Result Date: 11/04/2019 Alexis Goodell, MD     11/04/2019  5:51 PM ELECTROENCEPHALOGRAM REPORT Patient: Antonio Bowen       Room #: 1B14N EEG No. ID: [id number] Age: 74 y.o.        Sex: male Requesting Physician: Reesa Chew  Report Date:  11/04/2019       Interpreting Physician: Alexis Goodell History: KENDON SEDENO is an 74 y.o. male with LUE weakness and difficulty with gait Medications: Plavix, Synthroid, Mobic, Lipitor Conditions of Recording:  This is a 21 channel routine scalp EEG performed with bipolar and monopolar montages arranged in accordance to the international 10/20 system of electrode placement. One channel was dedicated to EKG recording. The patient is in the awake and drowsy states. Description:  The waking background  activity consists of a low voltage, symmetrical, fairly well organized, 8-9 Hz alpha activity, seen from the parieto-occipital and posterior temporal regions.  Low voltage fast activity, poorly organized, is seen anteriorly and is at times superimposed on more posterior regions.  A mixture of theta and alpha rhythms are seen from the central and temporal regions. The patient drowses with slowing to irregular, low voltage theta and beta activity.  Stage II sleep is not obtained. No epileptiform activity is noted.  Hyperventilation was not performed.  Intermittent photic stimulation was performed but failed to illicit any change in the tracing. IMPRESSION: Normal electroencephalogram, awake, drowsy and with activation procedures. There are no focal lateralizing or epileptiform features. Alexis Goodell, MD Neurology [phone] 11/04/2019, 5:48 PM   ECHOCARDIOGRAM COMPLETE  Result Date: 11/04/2019    ECHOCARDIOGRAM REPORT   Patient Name:   Antonio Bowen Date of Exam: 11/04/2019 Medical Rec #:  [id number]        Height:       69.0 in Accession #:    [id number]       Weight:       204.6 lb Date of Birth:  Dec 28, 1945         BSA:          2.086 m Patient Age:    70 years         BP:           110/97 mmHg Patient Gender: M                HR:           98 bpm. Exam Location:  Inpatient Procedure: 2D Echo Indications:    TIA  History:        Patient has prior history of Echocardiogram examinations, most                 recent 02/28/2015. COPD and Stroke; Risk Factors:Hypertension                 and Former Smoker. Bronchiolitis obliterans organizing pneumonia                 (BOOP). Dysrhythmia. tobacco dependence.  Sonographer:    Jannett Celestine RDCS (AE) Referring Phys: [id number] Lequita Halt  Sonographer Comments: No parasternal window, suboptimal apical window and Technically difficult study due to poor echo windows. Image acquisition challenging due to COPD, Image acquisition challenging due to respiratory motion and  restriced mobility. see  comments IMPRESSIONS  1. Limited echo no para sternal windows.  2. Left ventricular ejection fraction, by estimation, is 60 to 65%. The left ventricle has normal function. The left ventricle has no regional wall motion abnormalities. Left ventricular diastolic parameters are indeterminate.  3. Right ventricular systolic function is normal. The right ventricular size is normal.  4. The mitral valve is normal in structure. not seen well mitral valve regurgitation. No evidence of mitral stenosis.  5. Tricuspid valve regurgitation not seen well.  6. The  aortic valve was not well visualized. Aortic valve regurgitation not seen well. not seen well.  7. The inferior vena cava is normal in size with greater than 50% respiratory variability, suggesting right atrial pressure of 3 mmHg. FINDINGS  Left Ventricle: Left ventricular ejection fraction, by estimation, is 60 to 65%. The left ventricle has normal function. The left ventricle has no regional wall motion abnormalities. Definity contrast agent was given IV to delineate the left ventricular  endocardial borders. The left ventricular internal cavity size was normal in size. There is no left ventricular hypertrophy. Left ventricular diastolic parameters are indeterminate. Right Ventricle: The right ventricular size is normal. No increase in right ventricular wall thickness. Right ventricular systolic function is normal. Left Atrium: Left atrial size was normal in size. Right Atrium: Right atrial size was normal in size. Pericardium: There is no evidence of pericardial effusion. Mitral Valve: The mitral valve is normal in structure. Normal mobility of the mitral valve leaflets. Not seen well mitral valve regurgitation. No evidence of mitral valve stenosis. Tricuspid Valve: The tricuspid valve is not well visualized. Tricuspid valve regurgitation not seen well. No evidence of tricuspid stenosis. Aortic Valve: The aortic valve was not well visualized.  Aortic valve regurgitation not seen well. Not seen well. Pulmonic Valve: The pulmonic valve was normal in structure. Pulmonic valve regurgitation is not visualized. No evidence of pulmonic stenosis. Aorta: The aortic root is normal in size and structure. Venous: The inferior vena cava is normal in size with greater than 50% respiratory variability, suggesting right atrial pressure of 3 mmHg. IAS/Shunts: The interatrial septum was not well visualized. Additional Comments: Limited echo no para sternal windows.   Diastology LV e' medial: 5.33 cm/s  AORTIC VALVE LVOT Vmax:   57.40 cm/s LVOT Vmean:  37.600 cm/s LVOT VTI:    0.104 m  SHUNTS Systemic VTI: 0.10 m Jenkins Rouge MD Electronically signed by Jenkins Rouge MD Signature Date/Time: 11/04/2019/4:28:59 PM    Final    VAS US CAROTID  Result Date: 11/04/2019 Carotid Arterial Duplex Study Indications:       Carotid stenosis. Risk Factors:      Hypertension. Comparison Study:  no prior Performing Technologist: Abram Sander RVS  Examination Guidelines: A complete evaluation includes B-mode imaging, spectral Doppler, color Doppler, and power Doppler as needed of all accessible portions of each vessel. Bilateral testing is considered an integral part of a complete examination. Limited examinations for reoccurring indications may be performed as noted.  Right Carotid Findings: +----------+--------+--------+--------+------------------+--------+           PSV cm/sEDV cm/sStenosisPlaque DescriptionComments +----------+--------+--------+--------+------------------+--------+ CCA Prox  62      17              heterogenous               +----------+--------+--------+--------+------------------+--------+ CCA Distal68      19              heterogenous               +----------+--------+--------+--------+------------------+--------+ ICA Prox  46      14      1-39%   heterogenous                +----------+--------+--------+--------+------------------+--------+ ICA Distal95      30                                         +----------+--------+--------+--------+------------------+--------+  ECA       69      17                                         +----------+--------+--------+--------+------------------+--------+ +----------+--------+-------+--------+-------------------+           PSV cm/sEDV cmsDescribeArm Pressure (mmHG) +----------+--------+-------+--------+-------------------+ [id number]                                         +----------+--------+-------+--------+-------------------+ +---------+--------+--------+------------+ VertebralPSV cm/sEDV cm/sNot assessed +---------+--------+--------+------------+  Left Carotid Findings: +----------+--------+--------+--------+------------------+--------+           PSV cm/sEDV cm/sStenosisPlaque DescriptionComments +----------+--------+--------+--------+------------------+--------+ CCA Prox  61      21              heterogenous               +----------+--------+--------+--------+------------------+--------+ CCA Distal52      14              heterogenous               +----------+--------+--------+--------+------------------+--------+ ICA Prox  46      22      1-39%   heterogenous               +----------+--------+--------+--------+------------------+--------+ ICA Distal54      19                                         +----------+--------+--------+--------+------------------+--------+ ECA       65      21                                         +----------+--------+--------+--------+------------------+--------+ +----------+--------+--------+--------+-------------------+           PSV cm/sEDV cm/sDescribeArm Pressure (mmHG) +----------+--------+--------+--------+-------------------+ [id number]                                          +----------+--------+--------+--------+-------------------+ +---------+--------+--------+------------+ VertebralPSV cm/sEDV cm/sNot assessed +---------+--------+--------+------------+   Summary: Right Carotid: Velocities in the right ICA are consistent with a 1-39% stenosis. Left Carotid: Velocities in the left ICA are consistent with a 1-39% stenosis. Vertebrals: Bilateral vertebral arteries were not visualized. *See table(s) above for measurements and observations.  Electronically signed by Antony Contras MD on 11/04/2019 at 12:52:48 PM.    Final     12-lead ECG on admission NSR 76, normal intervals (personally reviewed) All prior EKG's in EPIC reviewed with no documented atrial fibrillation  Telemetry NSR 70-90s (personally reviewed)  Assessment and Plan:  1. Cryptogenic stroke The patient presents with cryptogenic stroke.  The patient does not have a TEE planned for this AM.  I spoke at length with the patient about monitoring for afib with an implantable loop recorder.  Risks, benefits, and alteratives to implantable loop recorder were discussed with the patient today.   At this time, the patient is very clear in their decision to proceed with implantable loop recorder.   2. H/o near syncope/chest fullness Seen 2016 by Dr. Angelena Form Pt  wore holter 01/2015 that showed sinus bradycardia, PACs, and PVCs Echo normal at that time so no ischemic work up (normal cath 2002 per notes)  3. Hypokalemia K 3.4. Stable at baseline. Would give 40 meq x 1 and follow as outpatient with PCP  Wound care was reviewed with the patient (keep incision clean and dry for 3 days).  Wound check scheduled and entered in AVS. Please call with questions.   Shirley Friar, PA-C 11/05/2019 8:59 AM   I have seen, examined the patient, and reviewed the above assessment and plan.  Changes to above are made where necessary.  On exam, RRR.  The patient presented with stroke of unknown cause.  I agree with dr Leonie Man  that long term monitoring is appropriate to evaluate for afib as the cause of his stroke. Risks and benefits to ILR were discussed with the patient who wishes to proceed.  He understands that risks include but are not to bleeding and infection.  Co Sign: Thompson Grayer, MD 11/05/2019 11:51 AM

## 2019-11-05 NOTE — Discharge Summary (Signed)
Physician Discharge Summary  Antonio Bowen IRJ:[id number] DOB: 02/07/1946 DOA: 11/03/2019  PCP: [name], [name], MD  Admit date: 11/03/2019 Discharge date: 11/05/2019  Admitted From: Home Disposition: Home  Recommendations for Outpatient Follow-up:  1. Follow up with PCP in 1-2 weeks 2. Please obtain BMP/CBC in one week your next doctors visit.  3. Aspirin and Plavix for 3 weeks followed by Plavix alone 4. Omeprazole switch to Protonix 40 mg twice daily before meals 5. Lipitor 40 mg daily 6. Follow-up outpatient neurology next 3-4 weeks  Home Health: Patient refused Equipment/Devices: None Discharge Condition: Stable CODE STATUS: Full Diet recommendation: Heart healthy  Brief/Interim Summary: 74 y.o.malewith medical history significant ofremote stroke not tolerating aspirin for severe hemoptysis status post bronchoscopy, severe multijoint OA on Mobic not tolerating narcotic or other NSAIDs, retention, hypothyroidism, COPD,GERD presented with new onset of left arm weakness and unsteady gait. CT head:9 mm infarct within the right cerebellum, new from prior head CT 02/18/2015 but otherwise age indeterminate. A lacunar infarct within the left basal ganglia/external capsule is also new from this prior exam but appears chronic.  Neurology team consulted.  LDL 35, A1c 5.7.  EEG was negative, loop recorder was placed on 7/8 on the day of discharge. Seen by PT OT who recommended home health but patient refused due to concerns with insurance and out-of-pocket cost.  Today stable for discharge  Assessment & Plan:   Principal Problem:   Acute CVA (cerebrovascular accident) ([place]) Active Problems:   Hypertension   GERD (gastroesophageal reflux disease)   Hypothyroidism   Hyperlipidemia LDL goal <130   Chronic kidney disease (CKD), stage III (moderate)   Esophageal stricture  Acute right cerebellar infarct -CT head shows 9 mm right cerebellar infarct, lacunar infarct with left  basal ganglia. -MRI brain/MRA head-small acute right corona radiata infarct, subacute right cerebellar infarct.  MRA head-negative -A1c 5.7, LDL 35 -Echocardiogram-EF 60 to 65% -EEG-normal -Carotid Dopplers-1-39% stenosis bilaterally -Appreciate neurology recommendations.  Aspirin and Plavix for 3 weeks followed by Plavix alone -Loop recorder will be placed prior to his discharge  Essential hypertension -Slowly resume his home blood pressure medicine  Severe OA -As above  CKD stage IIIa; baseline creatinine 1.7. -Creatinine level stable, patient is euvolemic  Chronic dysphagia -Has history of esophageal stenosis shown on most recent EGD in May 2021, has signs of esophagitis as well. Continue PPI and Sucralfate.  Omeprazole has been changed to pantoprazole  COPD -Stable  Body mass index is 30.21 kg/m.         Discharge Diagnoses:  Principal Problem:   Acute CVA (cerebrovascular accident) ([place]) Active Problems:   Hypertension   GERD (gastroesophageal reflux disease)   Hypothyroidism   Hyperlipidemia LDL goal <130   Chronic kidney disease (CKD), stage III (moderate)   Esophageal stricture   CVA (cerebral vascular accident) [facility])      Consultations:  Neurology  Subjective: Feels great he wants to go home.  Does not want any outpatient physical therapy due to concerns of insurance and out-of-pocket cost  Discharge Exam: Vitals:   11/05/19 0342 11/05/19 0755  BP: (!) 143/84 (!) 142/91  Pulse: 76 74  Resp: 18 18  Temp: 97.9 F (36.6 C) 98.8 F (37.1 C)  SpO2: 96% 97%   Vitals:   11/04/19 2000 11/04/19 2333 11/05/19 0342 11/05/19 0755  BP: 112/88 (!) 143/105 (!) 143/84 (!) 142/91  Pulse: 98 81 76 74  Resp: 18 18 18 18   Temp: 98.2 F (36.8 C) 98.5 F (  36.9 C) 97.9 F (36.6 C) 98.8 F (37.1 C)  TempSrc: Oral Oral Oral Oral  SpO2: 95% 96% 96% 97%  Weight:      Height:        General: Pt is alert, awake, not in acute  distress Cardiovascular: RRR, S1/S2 +, no rubs, no gallops Respiratory: CTA bilaterally, no wheezing, no rhonchi Abdominal: Soft, NT, ND, bowel sounds + Extremities: no edema, no cyanosis Neuro-left upper extremity strength 4/5 rest of the extremities 5/5  Discharge Instructions   Allergies as of 11/05/2019      Reactions   Nuts Nausea And Vomiting   sweating   Other Nausea And Vomiting   Per patient, cannot take any narcotic. Nausea and vomiting sweating   Vicodin [hydrocodone-acetaminophen]    tachycardia   Codeine    Aspirin Other (See Comments)   Upset stomach, bleeding   Penicillins Hives, Rash   Has patient had a PCN reaction causing immediate rash, facial/tongue/throat swelling, SOB or lightheadedness with hypotension:No Has patient had a PCN reaction causing severe rash involving mucus membranes or skin necrosis:No Has patient had a PCN reaction that required hospitalization:No Has patient had a PCN reaction occurring within the last 10 years:Yes If all of the above answers are "NO", then may proceed with Cephalosporin use.      Medication List    STOP taking these medications   omeprazole 40 MG capsule Commonly known as: PRILOSEC     TAKE these medications   acetaminophen 500 MG tablet Commonly known as: TYLENOL Take 1,000 mg by mouth 2 (two) times daily as needed (for pain.).   albuterol 108 (90 Base) MCG/ACT inhaler Commonly known as: Ventolin HFA Inhale 1-2 puffs into the lungs every 4 (four) hours as needed for wheezing or shortness of breath. INHALE TWO PUFFS INTO LUNGS EVERY 6 HOURS AS NEEDED FOR WHEEZING   aspirin EC 81 MG tablet Take 1 tablet (81 mg total) by mouth daily for 21 days. Swallow whole.   atorvastatin 40 MG tablet Commonly known as: LIPITOR Take 1 tablet (40 mg total) by mouth daily.   budesonide-formoterol 160-4.5 MCG/ACT inhaler Commonly known as: Symbicort Inhale 2 puffs into the lungs in the morning and at bedtime. Needs to be seen  for further refills.   cetirizine 10 MG tablet Commonly known as: ZYRTEC Take 10 mg by mouth at bedtime.   clopidogrel 75 MG tablet Commonly known as: PLAVIX Take 1 tablet (75 mg total) by mouth daily.   fluticasone 50 MCG/ACT nasal spray Commonly known as: FLONASE Place 1 spray into both nostrils 2 (two) times daily as needed for allergies or rhinitis.   Flutter Devi Use three times daily   LEG CRAMP RELIEF PO Take 2 tablets by mouth at bedtime. Hyland's   levothyroxine 125 MCG tablet Commonly known as: SYNTHROID Take 1 tablet (125 mcg total) by mouth daily before breakfast.   losartan-hydrochlorothiazide 50-12.5 MG tablet Commonly known as: HYZAAR Take 1 tablet by mouth daily.   magnesium oxide 400 MG tablet Commonly known as: MAG-OX Take 400 mg by mouth at bedtime.   meloxicam 15 MG tablet Commonly known as: MOBIC Take 1 tablet (15 mg total) by mouth daily.   pantoprazole 40 MG tablet Commonly known as: PROTONIX Take 1 tablet (40 mg total) by mouth 2 (two) times daily before a meal.   PRESCRIPTION MEDICATION Oxygen 3.0 liters at night, prn use in day   sucralfate 1 g tablet Commonly known as: CARAFATE TAKE 1 TABLET BY  MOUTH 4 TIMES DAILY (WITH MEALS AND AT BEDTIME) What changed:   See the new instructions.  Another medication with the same name was removed. Continue taking this medication, and follow the directions you see here.   SUPER B COMPLEX/C PO Take 1 capsule by mouth at bedtime.   traMADol 50 MG tablet Commonly known as: ULTRAM Take 1 tablet (50 mg total) by mouth every 8 (eight) hours as needed.       Follow-up Information    [facility] Follow up on 11/19/2019.   Why: at 1200 noon for post loop recorder wound check Contact information: [place] [id number] [phone]             Allergies  Allergen Reactions  . Nuts Nausea And Vomiting     sweating  . Other Nausea And Vomiting    Per patient, cannot take any narcotic. Nausea and vomiting sweating  . Vicodin [Hydrocodone-Acetaminophen]     tachycardia  . Codeine   . Aspirin Other (See Comments)    Upset stomach, bleeding  . Penicillins Hives and Rash    Has patient had a PCN reaction causing immediate rash, facial/tongue/throat swelling, SOB or lightheadedness with hypotension:No Has patient had a PCN reaction causing severe rash involving mucus membranes or skin necrosis:No Has patient had a PCN reaction that required hospitalization:No Has patient had a PCN reaction occurring within the last 10 years:Yes If all of the above answers are "NO", then may proceed with Cephalosporin use.     You were cared for by a hospitalist during your hospital stay. If you have any questions about your discharge medications or the care you received while you were in the hospital after you are discharged, you can call the unit and asked to speak with the hospitalist on call if the hospitalist that took care of you is not available. Once you are discharged, your primary care physician will handle any further medical issues. Please note that no refills for any discharge medications will be authorized once you are discharged, as it is imperative that you return to your primary care physician (or establish a relationship with a primary care physician if you do not have one) for your aftercare needs so that they can reassess your need for medications and monitor your lab values.   Procedures/Studies: CT HEAD WO CONTRAST  Result Date: 11/03/2019 CLINICAL DATA:  Possible stroke; neuro deficit, acute, stroke suspected; head trauma, focal neuro findings. Additional history provided: Unable to move left arm, tingling in left thigh and left face. EXAM: CT HEAD WITHOUT CONTRAST TECHNIQUE: Contiguous axial images were obtained from the base of the skull through the vertex without intravenous contrast.  COMPARISON:  Noncontrast head CT 02/18/2015 FINDINGS: Brain: Mild generalized parenchymal atrophy. Redemonstrated small chronic lacunar infarct within the right caudate nucleus. A chronic lacunar infarct within the left lentiform nucleus/external capsule is well demarcated and appears chronic, although is new as compared to prior head CT 02/18/2015. A 9 mm infarct within the right cerebellum was not present on the prior CT and is age indeterminate. Redemonstrated small chronic infarct within the left cerebellum. Background mild ill-defined hypoattenuation within the cerebral white matter is nonspecific, but consistent with chronic small vessel ischemic disease. There is no acute intracranial hemorrhage. No acute demarcated cortical infarct is identified. No extra-axial fluid collection. No evidence of intracranial mass. No midline shift. Vascular: No hyperdense vessel.  Atherosclerotic calcifications Skull: Normal. Negative for fracture  or focal lesion. Sinuses/Orbits: Prior left maxillofacial reconstructive surgery. Visualized orbits show no acute finding. Mild ethmoid sinus mucosal thickening. No significant mastoid effusion. IMPRESSION: 9 mm infarct within the right cerebellum, new from prior head CT 02/18/2015 but otherwise age indeterminate. A lacunar infarct within the left basal ganglia/external capsule is also new from this prior exam but appears chronic. Redemonstrated small chronic infarcts within the right caudate nucleus and left cerebellum. Background mild generalized parenchymal atrophy and chronic small vessel ischemic disease. Mild ethmoid sinus mucosal thickening. Electronically Signed   By: Kellie Simmering DO   On: 11/03/2019 12:14   MR ANGIO HEAD WO CONTRAST  Result Date: 11/03/2019 CLINICAL DATA:  Abnormal CT, stroke suspected EXAM: MRI HEAD WITHOUT CONTRAST MRA HEAD WITHOUT CONTRAST TECHNIQUE: Multiplanar, multiecho pulse sequences of the brain and surrounding structures were obtained without  intravenous contrast. Angiographic images of the head were obtained using MRA technique without contrast. COMPARISON:  None. FINDINGS: MRI HEAD Brain: There is a subcentimeter focus of minimally reduced diffusion in the right cerebellar hemisphere. Additional 12 mm focus of reduced diffusion in the right corona radiata. There are small chronic infarcts of the right caudate, left corona radiata, and left cerebellum. Additional patchy T2 hyperintensity in the supratentorial and pontine white matter is nonspecific but may reflect mild to moderate chronic microvascular ischemic changes. Prominence of the ventricles and sulci reflects minor generalized parenchymal volume loss. There is no intracranial mass, mass effect, or edema. There is no hydrocephalus or extra-axial fluid collection. Vascular: Major vessel flow voids at the skull base are preserved. Skull and upper cervical spine: Normal marrow signal is preserved. Sinuses/Orbits: Paranasal sinuses are aerated. Right lens replacement. Other: Sella is unremarkable.  Mastoid air cells are clear. MRA HEAD Intracranial internal carotid arteries are patent. Middle and anterior cerebral arteries are patent. Intracranial vertebral arteries, basilar artery, posterior cerebral arteries are patent. Bilateral posterior communicating arteries are present with fetal or near fetal origin of the posterior cerebral arteries. There is no significant stenosis or aneurysm. IMPRESSION: Small acute infarct of the right corona radiata. Smaller acute to subacute infarct of the right cerebellar hemisphere. Mild to moderate chronic microvascular ischemic changes. Few chronic small infarcts as detailed above. No proximal intracranial vessel occlusion or significant stenosis. Electronically Signed   By: Macy Mis M.D.   On: 11/03/2019 16:00   MR BRAIN WO CONTRAST  Result Date: 11/03/2019 CLINICAL DATA:  Abnormal CT, stroke suspected EXAM: MRI HEAD WITHOUT CONTRAST MRA HEAD WITHOUT  CONTRAST TECHNIQUE: Multiplanar, multiecho pulse sequences of the brain and surrounding structures were obtained without intravenous contrast. Angiographic images of the head were obtained using MRA technique without contrast. COMPARISON:  None. FINDINGS: MRI HEAD Brain: There is a subcentimeter focus of minimally reduced diffusion in the right cerebellar hemisphere. Additional 12 mm focus of reduced diffusion in the right corona radiata. There are small chronic infarcts of the right caudate, left corona radiata, and left cerebellum. Additional patchy T2 hyperintensity in the supratentorial and pontine white matter is nonspecific but may reflect mild to moderate chronic microvascular ischemic changes. Prominence of the ventricles and sulci reflects minor generalized parenchymal volume loss. There is no intracranial mass, mass effect, or edema. There is no hydrocephalus or extra-axial fluid collection. Vascular: Major vessel flow voids at the skull base are preserved. Skull and upper cervical spine: Normal marrow signal is preserved. Sinuses/Orbits: Paranasal sinuses are aerated. Right lens replacement. Other: Sella is unremarkable.  Mastoid air cells are clear. MRA HEAD Intracranial internal  carotid arteries are patent. Middle and anterior cerebral arteries are patent. Intracranial vertebral arteries, basilar artery, posterior cerebral arteries are patent. Bilateral posterior communicating arteries are present with fetal or near fetal origin of the posterior cerebral arteries. There is no significant stenosis or aneurysm. IMPRESSION: Small acute infarct of the right corona radiata. Smaller acute to subacute infarct of the right cerebellar hemisphere. Mild to moderate chronic microvascular ischemic changes. Few chronic small infarcts as detailed above. No proximal intracranial vessel occlusion or significant stenosis. Electronically Signed   By: Macy Mis M.D.   On: 11/03/2019 16:00   EEG adult  Result Date:  11/04/2019 Alexis Goodell, MD     11/04/2019  5:51 PM ELECTROENCEPHALOGRAM REPORT Patient: Antonio Bowen       Room #: 2I78M EEG No. ID: [id number] Age: 74 y.o.        Sex: male Requesting Physician: Reesa Chew Report Date:  11/04/2019       Interpreting Physician: Alexis Goodell History: GAD AYMOND is an 74 y.o. male with LUE weakness and difficulty with gait Medications: Plavix, Synthroid, Mobic, Lipitor Conditions of Recording:  This is a 21 channel routine scalp EEG performed with bipolar and monopolar montages arranged in accordance to the international 10/20 system of electrode placement. One channel was dedicated to EKG recording. The patient is in the awake and drowsy states. Description:  The waking background activity consists of a low voltage, symmetrical, fairly well organized, 8-9 Hz alpha activity, seen from the parieto-occipital and posterior temporal regions.  Low voltage fast activity, poorly organized, is seen anteriorly and is at times superimposed on more posterior regions.  A mixture of theta and alpha rhythms are seen from the central and temporal regions. The patient drowses with slowing to irregular, low voltage theta and beta activity.  Stage II sleep is not obtained. No epileptiform activity is noted.  Hyperventilation was not performed.  Intermittent photic stimulation was performed but failed to illicit any change in the tracing. IMPRESSION: Normal electroencephalogram, awake, drowsy and with activation procedures. There are no focal lateralizing or epileptiform features. Alexis Goodell, MD Neurology [phone] 11/04/2019, 5:48 PM   ECHOCARDIOGRAM COMPLETE  Result Date: 11/04/2019    ECHOCARDIOGRAM REPORT   Patient Name:   Antonio Bowen Date of Exam: 11/04/2019 Medical Rec #:  [id number]        Height:       69.0 in Accession #:    [id number]       Weight:       204.6 lb Date of Birth:  26-Jul-1945         BSA:          2.086 m Patient Age:    22 years         BP:           110/97 mmHg  Patient Gender: M                HR:           98 bpm. Exam Location:  Inpatient Procedure: 2D Echo Indications:    TIA  History:        Patient has prior history of Echocardiogram examinations, most                 recent 02/28/2015. COPD and Stroke; Risk Factors:Hypertension                 and Former Smoker. Bronchiolitis obliterans organizing pneumonia                 (  BOOP). Dysrhythmia. tobacco dependence.  Sonographer:    Jannett Celestine RDCS (AE) Referring Phys: [id number] Lequita Halt  Sonographer Comments: No parasternal window, suboptimal apical window and Technically difficult study due to poor echo windows. Image acquisition challenging due to COPD, Image acquisition challenging due to respiratory motion and restriced mobility. see  comments IMPRESSIONS  1. Limited echo no para sternal windows.  2. Left ventricular ejection fraction, by estimation, is 60 to 65%. The left ventricle has normal function. The left ventricle has no regional wall motion abnormalities. Left ventricular diastolic parameters are indeterminate.  3. Right ventricular systolic function is normal. The right ventricular size is normal.  4. The mitral valve is normal in structure. not seen well mitral valve regurgitation. No evidence of mitral stenosis.  5. Tricuspid valve regurgitation not seen well.  6. The aortic valve was not well visualized. Aortic valve regurgitation not seen well. not seen well.  7. The inferior vena cava is normal in size with greater than 50% respiratory variability, suggesting right atrial pressure of 3 mmHg. FINDINGS  Left Ventricle: Left ventricular ejection fraction, by estimation, is 60 to 65%. The left ventricle has normal function. The left ventricle has no regional wall motion abnormalities. Definity contrast agent was given IV to delineate the left ventricular  endocardial borders. The left ventricular internal cavity size was normal in size. There is no left ventricular hypertrophy. Left ventricular  diastolic parameters are indeterminate. Right Ventricle: The right ventricular size is normal. No increase in right ventricular wall thickness. Right ventricular systolic function is normal. Left Atrium: Left atrial size was normal in size. Right Atrium: Right atrial size was normal in size. Pericardium: There is no evidence of pericardial effusion. Mitral Valve: The mitral valve is normal in structure. Normal mobility of the mitral valve leaflets. Not seen well mitral valve regurgitation. No evidence of mitral valve stenosis. Tricuspid Valve: The tricuspid valve is not well visualized. Tricuspid valve regurgitation not seen well. No evidence of tricuspid stenosis. Aortic Valve: The aortic valve was not well visualized. Aortic valve regurgitation not seen well. Not seen well. Pulmonic Valve: The pulmonic valve was normal in structure. Pulmonic valve regurgitation is not visualized. No evidence of pulmonic stenosis. Aorta: The aortic root is normal in size and structure. Venous: The inferior vena cava is normal in size with greater than 50% respiratory variability, suggesting right atrial pressure of 3 mmHg. IAS/Shunts: The interatrial septum was not well visualized. Additional Comments: Limited echo no para sternal windows.   Diastology LV e' medial: 5.33 cm/s  AORTIC VALVE LVOT Vmax:   57.40 cm/s LVOT Vmean:  37.600 cm/s LVOT VTI:    0.104 m  SHUNTS Systemic VTI: 0.10 m Jenkins Rouge MD Electronically signed by Jenkins Rouge MD Signature Date/Time: 11/04/2019/4:28:59 PM    Final    VAS US CAROTID  Result Date: 11/04/2019 Carotid Arterial Duplex Study Indications:       Carotid stenosis. Risk Factors:      Hypertension. Comparison Study:  no prior Performing Technologist: Abram Sander RVS  Examination Guidelines: A complete evaluation includes B-mode imaging, spectral Doppler, color Doppler, and power Doppler as needed of all accessible portions of each vessel. Bilateral testing is considered an integral part of a  complete examination. Limited examinations for reoccurring indications may be performed as noted.  Right Carotid Findings: +----------+--------+--------+--------+------------------+--------+           PSV cm/sEDV cm/sStenosisPlaque DescriptionComments +----------+--------+--------+--------+------------------+--------+ CCA Prox  62      17  heterogenous               +----------+--------+--------+--------+------------------+--------+ CCA Distal68      19              heterogenous               +----------+--------+--------+--------+------------------+--------+ ICA Prox  46      14      1-39%   heterogenous               +----------+--------+--------+--------+------------------+--------+ ICA Distal95      30                                         +----------+--------+--------+--------+------------------+--------+ ECA       69      17                                         +----------+--------+--------+--------+------------------+--------+ +----------+--------+-------+--------+-------------------+           PSV cm/sEDV cmsDescribeArm Pressure (mmHG) +----------+--------+-------+--------+-------------------+ [id number]                                         +----------+--------+-------+--------+-------------------+ +---------+--------+--------+------------+ VertebralPSV cm/sEDV cm/sNot assessed +---------+--------+--------+------------+  Left Carotid Findings: +----------+--------+--------+--------+------------------+--------+           PSV cm/sEDV cm/sStenosisPlaque DescriptionComments +----------+--------+--------+--------+------------------+--------+ CCA Prox  61      21              heterogenous               +----------+--------+--------+--------+------------------+--------+ CCA Distal52      14              heterogenous               +----------+--------+--------+--------+------------------+--------+ ICA Prox  46       22      1-39%   heterogenous               +----------+--------+--------+--------+------------------+--------+ ICA Distal54      19                                         +----------+--------+--------+--------+------------------+--------+ ECA       65      21                                         +----------+--------+--------+--------+------------------+--------+ +----------+--------+--------+--------+-------------------+           PSV cm/sEDV cm/sDescribeArm Pressure (mmHG) +----------+--------+--------+--------+-------------------+ [id number]                                         +----------+--------+--------+--------+-------------------+ +---------+--------+--------+------------+ VertebralPSV cm/sEDV cm/sNot assessed +---------+--------+--------+------------+   Summary: Right Carotid: Velocities in the right ICA are consistent with a 1-39% stenosis. Left Carotid: Velocities in the left ICA are consistent with a 1-39% stenosis. Vertebrals: Bilateral vertebral arteries were not visualized. *See table(s) above for measurements and observations.  Electronically signed by Antony Contras MD on 11/04/2019 at 12:52:48 PM.    Final       The results of significant diagnostics from this hospitalization (including imaging, microbiology, ancillary and laboratory) are listed below for reference.     Microbiology: Recent Results (from the past 240 hour(s))  SARS Coronavirus 2 by RT PCR (hospital order, performed in [facility] hospital lab) Nasopharyngeal Nasopharyngeal Swab     Status: None   Collection Time: 11/03/19  2:35 PM   Specimen: Nasopharyngeal Swab  Result Value Ref Range Status   SARS Coronavirus 2 NEGATIVE NEGATIVE Final    Comment: (NOTE) SARS-CoV-2 target nucleic acids are NOT DETECTED.  The SARS-CoV-2 RNA is generally detectable in upper and lower respiratory specimens during the acute phase of infection. The lowest concentration of SARS-CoV-2 viral  copies this assay can detect is 250 copies / mL. A negative result does not preclude SARS-CoV-2 infection and should not be used as the sole basis for treatment or other patient management decisions.  A negative result may occur with improper specimen collection / handling, submission of specimen other than nasopharyngeal swab, presence of viral mutation(s) within the areas targeted by this assay, and inadequate number of viral copies (<250 copies / mL). A negative result must be combined with clinical observations, patient history, and epidemiological information.  Fact Sheet for Patients:   StrictlyIdeas.no  Fact Sheet for Healthcare Providers: BankingDealers.co.za  This test is not yet approved or  cleared by the [place] FDA and has been authorized for detection and/or diagnosis of SARS-CoV-2 by FDA under an Emergency Use Authorization (EUA).  This EUA will remain in effect (meaning this test can be used) for the duration of the COVID-19 declaration under Section 564(b)(1) of the Act, 21 U.S.C. section 360bbb-3(b)(1), unless the authorization is terminated or revoked sooner.  Performed at [facility], [place] [address] [place], [place] [address]      Labs: BNP (last 3 results) No results for input(s): BNP in the last 8760 hours. Basic Metabolic Panel: Recent Labs  Lab 11/03/19 1140 11/03/19 1157 11/05/19 0423  NA 138 141 139  K 3.4* 3.4* 3.4*  CL 101 101 102  CO2 25  --  26  GLUCOSE 117* 114* 117*  BUN 12 15 10   CREATININE 1.71* 1.70* 1.58*  CALCIUM 9.4  --  9.7  MG  --   --  2.1   Liver Function Tests: Recent Labs  Lab 11/03/19 1140  AST 25  ALT 27  ALKPHOS 64  BILITOT 0.8  PROT 7.2  ALBUMIN 4.1   No results for input(s): LIPASE, AMYLASE in the last 168 hours. No results for input(s): AMMONIA in the last 168 hours. CBC: Recent Labs  Lab 11/03/19 1140 11/03/19 1157  WBC 10.9*  --   NEUTROABS  6.0  --   HGB 17.5* 17.0  HCT 51.4 50.0  MCV 94.8  --   PLT 195  --    Cardiac Enzymes: No results for input(s): CKTOTAL, CKMB, CKMBINDEX, TROPONINI in the last 168 hours. BNP: Invalid input(s): POCBNP CBG: Recent Labs  Lab 11/03/19 1232  GLUCAP 105*   D-Dimer No results for input(s): DDIMER in the last 72 hours. Hgb A1c Recent Labs    11/04/19 0424  HGBA1C 5.7*   Lipid Profile Recent Labs    11/03/19 1140  CHOL 99  HDL 52  LDLCALC 35  TRIG 58  CHOLHDL 1.9   Thyroid function studies No results for input(s):  TSH, T4TOTAL, T3FREE, THYROIDAB in the last 72 hours.  Invalid input(s): FREET3 Anemia work up No results for input(s): VITAMINB12, FOLATE, FERRITIN, TIBC, IRON, RETICCTPCT in the last 72 hours. Urinalysis    Component Value Date/Time   COLORURINE YELLOW 02/15/2011 [place] 02/15/2011 1551   LABSPEC 1.022 02/15/2011 1551   PHURINE 6.0 02/15/2011 1551   GLUCOSEU NEGATIVE 02/15/2011 1551   HGBUR TRACE (A) 02/15/2011 1551   BILIRUBINUR NEGATIVE 02/15/2011 1551   KETONESUR NEGATIVE 02/15/2011 1551   PROTEINUR NEGATIVE 02/15/2011 1551   UROBILINOGEN 1.0 02/15/2011 1551   NITRITE NEGATIVE 02/15/2011 1551   LEUKOCYTESUR NEGATIVE 02/15/2011 1551   Sepsis Labs Invalid input(s): PROCALCITONIN,  WBC,  LACTICIDVEN Microbiology Recent Results (from the past 240 hour(s))  SARS Coronavirus 2 by RT PCR (hospital order, performed in [place] hospital lab) Nasopharyngeal Nasopharyngeal Swab     Status: None   Collection Time: 11/03/19  2:35 PM   Specimen: Nasopharyngeal Swab  Result Value Ref Range Status   SARS Coronavirus 2 NEGATIVE NEGATIVE Final    Comment: (NOTE) SARS-CoV-2 target nucleic acids are NOT DETECTED.  The SARS-CoV-2 RNA is generally detectable in upper and lower respiratory specimens during the acute phase of infection. The lowest concentration of SARS-CoV-2 viral copies this assay can detect is 250 copies / mL. A negative  result does not preclude SARS-CoV-2 infection and should not be used as the sole basis for treatment or other patient management decisions.  A negative result may occur with improper specimen collection / handling, submission of specimen other than nasopharyngeal swab, presence of viral mutation(s) within the areas targeted by this assay, and inadequate number of viral copies (<250 copies / mL). A negative result must be combined with clinical observations, patient history, and epidemiological information.  Fact Sheet for Patients:   StrictlyIdeas.no  Fact Sheet for Healthcare Providers: BankingDealers.co.za  This test is not yet approved or  cleared by the [place] FDA and has been authorized for detection and/or diagnosis of SARS-CoV-2 by FDA under an Emergency Use Authorization (EUA).  This EUA will remain in effect (meaning this test can be used) for the duration of the COVID-19 declaration under Section 564(b)(1) of the Act, 21 U.S.C. section 360bbb-3(b)(1), unless the authorization is terminated or revoked sooner.  Performed at [facility], [place] [address], [place], [place] [address]      Time coordinating discharge:  I have spent 35 minutes face to face with the patient and on the ward discussing the patients care, assessment, plan and disposition with other care givers. >50% of the time was devoted counseling the patient about the risks and benefits of treatment/Discharge disposition and coordinating care.   SIGNED:   Damita Lack, MD  [facility] 11/05/2019, 10:50 AM   If 7PM-7AM, please contact night-coverage

## 2019-11-05 NOTE — TOC Transition Note (Signed)
Transition of Care [facility]) - CM/SW Discharge Note   Patient Details  Name: Antonio Bowen MRN: [id number] Date of Birth: May 18, 1945  Transition of Care [facility]) CM/SW Contact:  Pollie Friar, RN Phone Number: 11/05/2019, 10:36 AM   Clinical Narrative:    Pt is discharging home with self care. Pt is refusing Outpatient therapy. PT met with him last pm and provided things to work on at home.  Pt has supervision at home and transportation to home.   Final next level of care: Home/Self Care Barriers to Discharge: No Barriers Identified   Patient Goals and CMS Choice        Discharge Placement                       Discharge Plan and Services   Discharge Planning Services: CM Consult                                 Social Determinants of Health (SDOH) Interventions     Readmission Risk Interventions No flowsheet data found.

## 2019-11-06 ENCOUNTER — Telehealth: Payer: Self-pay | Admitting: *Deleted

## 2019-11-06 NOTE — Telephone Encounter (Signed)
Antonio Bowen Bowen Bowen.......    TRANSITIONAL CARE MANAGEMENT TELEPHONE OUTREACH NOTE   Contact Date: 11/06/2019 Contacted By: Faylene Million, LPN   DISCHARGE INFORMATION Date of Discharge:11/05/2019 [facility] Discharge Diagnosis:Acute CVA   Outpatient Follow Up Recommendations  Recommendations for Outpatient Follow-up:  1. Follow up with PCP in 1-2 weeks 2. Please obtain BMP/CBC in one week your next doctors visit.  3. Aspirin and Plavix for 3 weeks followed by Plavix alone 4. Omeprazole switch to Protonix 40 mg twice daily before meals 5. Lipitor 40 mg daily 6. Follow-up outpatient neurology next 3-4 weeks  Antonio Bowen is a male primary care patient of [name], [name], MD. An outgoing telephone call was made today and I spoke with wife, Antonio Bowen.  Mr. Coakley condition(s) and treatment(s) were discussed. An opportunity to ask questions was provided and all were answered or forwarded as appropriate.    ACTIVITIES OF DAILY LIVING  Antonio Bowen lives with their spouse and he can perform ADLs independently. his primary caregiver is [name], wife. he is able to depend on his primary caregiver(s) for consistent help. Transportation to appointments, to pick up medications, and to run errands is not a problem.  (Consider referral to [facility] if transportation or a consistent caregiver is a problem)   Fall Risk Fall Risk  09/09/2019 12/24/2018  Falls in the past year? 0 0  Comment - -  Number falls in past yr: - -  Injury with Fall? - -  Comment - -  Risk Factor Category  - -  Risk for fall due to : - -  Follow up - -    low [place] Modifications/Assistive Devices Wheelchair: No Cane: No Ramp: No Bedside Toilet: No Hospital Bed:  No Other:    [place] he is not receiving home health.Patient refused PT at this time.   MEDICATION RECONCILIATION  Antonio Bowen has been able to pick-up all prescribed discharge medications from  the pharmacy.   A post discharge medication reconciliation was performed and the complete medication list was reviewed with the patient/caregiver and is current as of 11/06/2019. Changes highlighted below.  Discontinued Medications STOP taking these medications   omeprazole 40 MG capsule Commonly known as: PRILOSEC     Current Medication List Allergies as of 11/06/2019      Reactions   Nuts Nausea And Vomiting   sweating   Other Nausea And Vomiting   Per patient, cannot take any narcotic. Nausea and vomiting sweating   Vicodin [hydrocodone-acetaminophen]    tachycardia   Codeine    Aspirin Other (See Comments)   Upset stomach, bleeding   Penicillins Hives, Rash   Has patient had a PCN reaction causing immediate rash, facial/tongue/throat swelling, SOB or lightheadedness with hypotension:No Has patient had a PCN reaction causing severe rash involving mucus membranes or skin necrosis:No Has patient had a PCN reaction that required hospitalization:No Has patient had a PCN reaction occurring within the last 10 years:Yes If all of the above answers are "NO", then may proceed with Cephalosporin use.      Medication List       Accurate as of November 06, 2019  9:52 AM. If you have any questions, ask your nurse or doctor.        acetaminophen 500 MG tablet Commonly known as: TYLENOL Take 1,000 mg by mouth 2 (two) times daily as needed (for pain.).   albuterol 108 (90 Base) MCG/ACT inhaler Commonly known as:  Ventolin HFA Inhale 1-2 puffs into the lungs every 4 (four) hours as needed for wheezing or shortness of breath. INHALE TWO PUFFS INTO LUNGS EVERY 6 HOURS AS NEEDED FOR WHEEZING   aspirin EC 81 MG tablet Take 1 tablet (81 mg total) by mouth daily for 21 days. Swallow whole.   atorvastatin 40 MG tablet Commonly known as: LIPITOR Take 1 tablet (40 mg total) by mouth daily.   budesonide-formoterol 160-4.5 MCG/ACT inhaler Commonly known as: Symbicort Inhale 2 puffs into the  lungs in the morning and at bedtime. Needs to be seen for further refills.   cetirizine 10 MG tablet Commonly known as: ZYRTEC Take 10 mg by mouth at bedtime.   clopidogrel 75 MG tablet Commonly known as: PLAVIX Take 1 tablet (75 mg total) by mouth daily.   fluticasone 50 MCG/ACT nasal spray Commonly known as: FLONASE Place 1 spray into both nostrils 2 (two) times daily as needed for allergies or rhinitis.   Flutter Devi Use three times daily   LEG CRAMP RELIEF PO Take 2 tablets by mouth at bedtime. Hyland's   levothyroxine 125 MCG tablet Commonly known as: SYNTHROID Take 1 tablet (125 mcg total) by mouth daily before breakfast.   losartan-hydrochlorothiazide 50-12.5 MG tablet Commonly known as: HYZAAR Take 1 tablet by mouth daily.   magnesium oxide 400 MG tablet Commonly known as: MAG-OX Take 400 mg by mouth at bedtime.   meloxicam 15 MG tablet Commonly known as: MOBIC Take 1 tablet (15 mg total) by mouth daily.   pantoprazole 40 MG tablet Commonly known as: PROTONIX Take 1 tablet (40 mg total) by mouth 2 (two) times daily before a meal.   PRESCRIPTION MEDICATION Oxygen 3.0 liters at night, prn use in day   sucralfate 1 g tablet Commonly known as: CARAFATE TAKE 1 TABLET BY MOUTH 4 TIMES DAILY (WITH MEALS AND AT BEDTIME) What changed: See the new instructions.   SUPER B COMPLEX/C PO Take 1 capsule by mouth at bedtime.   traMADol 50 MG tablet Commonly known as: ULTRAM Take 1 tablet (50 mg total) by mouth every 8 (eight) hours as needed.        PATIENT EDUCATION & FOLLOW-UP PLAN  An appointment for Transitional Care Management is scheduled with [name], [name], MD on 11/16/19 at 1:40 PM  Take all medications as prescribed  Contact our office by calling [phone] if you have any questions or concerns

## 2019-11-12 ENCOUNTER — Ambulatory Visit (INDEPENDENT_AMBULATORY_CARE_PROVIDER_SITE_OTHER): Payer: Medicare HMO | Admitting: *Deleted

## 2019-11-12 ENCOUNTER — Telehealth: Payer: Self-pay | Admitting: *Deleted

## 2019-11-12 DIAGNOSIS — I639 Cerebral infarction, unspecified: Secondary | ICD-10-CM

## 2019-11-12 DIAGNOSIS — I10 Essential (primary) hypertension: Secondary | ICD-10-CM

## 2019-11-12 NOTE — Telephone Encounter (Signed)
11/12/2019  Needs neurology referral. Advised to f/u with neurology 3-4 after hospital discharge on 7/8 and patient has not been contacted by them to schedule an appt.   Will forward to PCP to order referral Patient advised to call the office if he has not received a call from neurology within the next 7 days  [name], BSN, RN-BC [place] / [place] Management Direct Dial: [phone]

## 2019-11-12 NOTE — Patient Instructions (Signed)
Visit Information  Goals Addressed              This Visit's Progress     Patient Stated   .  "I need a f/u appt with neurologist" (pt-stated)        [place] (see longitudinal plan of care for additional care plan information)  Current Barriers:  . Care Coordination needs related to appointment with Neurologist in a patient with HTN and recent CVA (disease states)  Nurse Case Manager Clinical Goal(s):  Marland Kitchen Over the next 14 days, patient will have an appointment scheduled with neurologist  Interventions:  . Inter-disciplinary care team collaboration (see longitudinal plan of care) . Evaluation of current treatment plan related to recent CVA and patient's adherence to plan as established by provider. Nash Dimmer with Dr [name] regarding referral to neurologist . Discussed plans with patient for ongoing care management follow up and provided patient with direct contact information for care management team . Reviewed scheduled/upcoming provider appointments including: 11/16/19 with Dr [name] and 7/22 with cardiology  Patient Self Care Activities:  . Performs ADL's independently . Performs IADL's independently  Initial goal documentation        Patient verbalizes understanding of instructions provided today.   Follow-up Plan The care management team will reach out to the patient again over the next 15 days.   [name], BSN, RN-BC Embedded Chronic Care Manager [facility] / [place] Management Direct Dial: [phone]

## 2019-11-12 NOTE — Chronic Care Management (AMB) (Signed)
  Chronic Care Management   EMMI Follow-up Note  11/12/2019 Name: Antonio Bowen MRN: [id number] DOB: [birth year]  I reached out to Antonio Bowen by telephone today regarding a red flag from automated EMMI call that he received s/p hospital discharge for CVA.   Reviewed and discussed medications. Patient started on Lipitor and Plavix in the hospital. Reporting some dizziness since discharge but states that it is much better today. He questioned if these medications were the cause. Advised that Plavix can cause dizziness. Encouraged patient to move and change positions slowly and to report any new or worsening symptoms to PCP and to seek emergency medical attention if needed.   Discussed hospital f/u appointment with PCP on 11/16/19 and cardiology on 11/19/19. Needs neurology appointment and has not been contacted by neuro to schedule.  RN will request neuro referral from PCP. Advised patient to call PCP office if he has not been contacted by neurology within 7 days.   Discussed insurance and billing concerns r/t to conversation he had with medical staff while inpatient. He was told that since he was in "observation" his insurance would not cover the stay. EMR states ED to Hospital Admission from 7/6 to 7/8. Advised patient to request an itemized bill for hospital stay and to reach out to [place] if he still has concerns once that has been received.   RN Care Plan   .  "I need a f/u appt with neurologist" (pt-stated)        [place] (see longitudinal plan of care for additional care plan information)  Current Barriers:  . Care Coordination needs related to appointment with Neurologist in a patient with HTN and recent CVA (disease states)  Nurse Case Manager Clinical Goal(s):  Marland Kitchen Over the next 14 days, patient will have an appointment scheduled with neurologist  Interventions:  . Inter-disciplinary care team collaboration (see longitudinal plan of care) . Evaluation of current treatment  plan related to recent CVA and patient's adherence to plan as established by provider. Nash Dimmer with Dr [name] regarding referral to neurologist . Discussed plans with patient for ongoing care management follow up and provided patient with direct contact information for care management team . Reviewed scheduled/upcoming provider appointments including: 11/16/19 with Dr [name] and 7/22 with cardiology  Patient Self Care Activities:  . Performs ADL's independently . Performs IADL's independently  Initial goal documentation        Follow up plan: The care management team will reach out to the patient again over the next 15 days.   [name], BSN, RN-BC Embedded Chronic Care Manager [facility] / [place] Management Direct Dial: [phone]

## 2019-11-16 ENCOUNTER — Encounter: Payer: Self-pay | Admitting: Family Medicine

## 2019-11-16 ENCOUNTER — Ambulatory Visit (INDEPENDENT_AMBULATORY_CARE_PROVIDER_SITE_OTHER): Payer: Medicare HMO | Admitting: Family Medicine

## 2019-11-16 ENCOUNTER — Other Ambulatory Visit: Payer: Self-pay

## 2019-11-16 VITALS — BP 125/79 | HR 102 | Temp 97.2°F | Ht 69.0 in | Wt 199.0 lb

## 2019-11-16 DIAGNOSIS — I6930 Unspecified sequelae of cerebral infarction: Secondary | ICD-10-CM | POA: Diagnosis not present

## 2019-11-16 DIAGNOSIS — I6389 Other cerebral infarction: Secondary | ICD-10-CM

## 2019-11-16 MED ORDER — PRAVASTATIN SODIUM 40 MG PO TABS: 40.0000 mg | ORAL_TABLET | Freq: Every day | ORAL | 3 refills | Status: DC

## 2019-11-16 MED ORDER — TRAZODONE HCL 50 MG PO TABS: 25.0000 mg | ORAL_TABLET | Freq: Every evening | ORAL | 3 refills | Status: DC | PRN

## 2019-11-16 MED ORDER — CLOPIDOGREL BISULFATE 75 MG PO TABS: 75.0000 mg | ORAL_TABLET | Freq: Every day | ORAL | 3 refills | Status: DC

## 2019-11-16 NOTE — Progress Notes (Signed)
BP 125/79   Pulse (!) 102   Temp (!) 97.2 F (36.2 C)   Ht '5\' 9"'$  (1.753 m)   Wt 199 lb (90.3 kg)   SpO2 96%   BMI 29.39 kg/m    Subjective:   Patient ID: Antonio Bowen, male    DOB: 04/13/1946, 74 y.o.   MRN: [id number]  HPI: Antonio Bowen is a 74 y.o. male presenting on 11/16/2019 for Hospitalization Follow-up (S/P CVA 7/6)   HPI TRANSITIONAL CARE MANAGEMENT TELEPHONE OUTREACH NOTE   Contact Date: 11/06/2019 Contacted By: Faylene Million, LPN   DISCHARGE INFORMATION Date of Discharge:11/05/2019 Dis[facility] [place]   He went in with left arm numbness and heavy in left arm.  He has face droop on the left side. He is set up with therapy at home. No vision issues.  He is still numb slightly on the left arm and hand. He was a little numb on left thigh but not now.  He has been working with some therapy and feels like things are improving.  His significant other here with him says that initially he was having some mental slowing and confusion but it seems like that is improved as well.  He denies any issues in his lower extremities and is able to ambulate well.  Relevant past medical, surgical, family and social history reviewed and updated as indicated. Interim medical history since our last visit reviewed. Allergies and medications reviewed and updated.  Review of Systems  Constitutional: Negative for chills and fever.  Eyes: Negative for discharge.  Respiratory: Negative for shortness of breath and wheezing.   Cardiovascular: Negative for chest pain and leg swelling.  Gastrointestinal: Negative for abdominal pain.  Musculoskeletal: Negative for back pain and gait problem.  Skin: Negative for rash.  Neurological: Positive for weakness and numbness.  All other systems reviewed and are negative.   Per HPI unless specifically indicated above   Allergies as of 11/16/2019      Reactions   Nuts Nausea And Vomiting     sweating   Other Nausea And Vomiting   Per patient, cannot take any narcotic. Nausea and vomiting sweating   Vicodin [hydrocodone-acetaminophen]    tachycardia   Codeine    Aspirin Other (See Comments)   Upset stomach, bleeding   Penicillins Hives, Rash   Has patient had a PCN reaction causing immediate rash, facial/tongue/throat swelling, SOB or lightheadedness with hypotension:No Has patient had a PCN reaction causing severe rash involving mucus membranes or skin necrosis:No Has patient had a PCN reaction that required hospitalization:No Has patient had a PCN reaction occurring within the last 10 years:Yes If all of the above answers are "NO", then may proceed with Cephalosporin use.      Medication List       Accurate as of November 16, 2019  1:55 PM. If you have any questions, ask your nurse or doctor.        acetaminophen 500 MG tablet Commonly known as: TYLENOL Take 1,000 mg by mouth 2 (two) times daily as needed (for pain.).   albuterol 108 (90 Base) MCG/ACT inhaler Commonly known as: Ventolin HFA Inhale 1-2 puffs into the lungs every 4 (four) hours as needed for wheezing or shortness of breath. INHALE TWO PUFFS INTO LUNGS EVERY 6 HOURS AS NEEDED FOR WHEEZING   aspirin EC 81 MG tablet Take 1 tablet (81 mg total) by mouth daily for 21 days. Swallow whole.   atorvastatin 40 MG  tablet Commonly known as: LIPITOR Take 1 tablet (40 mg total) by mouth daily.   budesonide-formoterol 160-4.5 MCG/ACT inhaler Commonly known as: Symbicort Inhale 2 puffs into the lungs in the morning and at bedtime. Needs to be seen for further refills.   cetirizine 10 MG tablet Commonly known as: ZYRTEC Take 10 mg by mouth at bedtime.   clopidogrel 75 MG tablet Commonly known as: PLAVIX Take 1 tablet (75 mg total) by mouth daily.   fluticasone 50 MCG/ACT nasal spray Commonly known as: FLONASE Place 1 spray into both nostrils 2 (two) times daily as needed for allergies or rhinitis.    Flutter Devi Use three times daily   LEG CRAMP RELIEF PO Take 2 tablets by mouth at bedtime. [employer]   levothyroxine 125 MCG tablet Commonly known as: SYNTHROID Take 1 tablet (125 mcg total) by mouth daily before breakfast.   losartan-hydrochlorothiazide 50-12.5 MG tablet Commonly known as: HYZAAR Take 1 tablet by mouth daily.   magnesium oxide 400 MG tablet Commonly known as: MAG-OX Take 400 mg by mouth at bedtime.   meloxicam 15 MG tablet Commonly known as: MOBIC Take 1 tablet (15 mg total) by mouth daily.   omeprazole 40 MG capsule Commonly known as: PRILOSEC Take 40 mg by mouth in the morning and at bedtime.   pantoprazole 40 MG tablet Commonly known as: PROTONIX Take 1 tablet (40 mg total) by mouth 2 (two) times daily before a meal.   PRESCRIPTION MEDICATION Oxygen 3.0 liters at night, prn use in day   sucralfate 1 g tablet Commonly known as: CARAFATE TAKE 1 TABLET BY MOUTH 4 TIMES DAILY (WITH MEALS AND AT BEDTIME) What changed: See the new instructions.   SUPER B COMPLEX/C PO Take 1 capsule by mouth at bedtime.   traMADol 50 MG tablet Commonly known as: ULTRAM Take 1 tablet (50 mg total) by mouth every 8 (eight) hours as needed.        Objective:   BP 125/79   Pulse (!) 102   Temp (!) 97.2 F (36.2 C)   Ht '5\' 9"'$  (1.753 m)   Wt 199 lb (90.3 kg)   SpO2 96%   BMI 29.39 kg/m   Wt Readings from Last 3 Encounters:  11/16/19 199 lb (90.3 kg)  11/04/19 204 lb 9.4 oz (92.8 kg)  09/09/19 203 lb (92.1 kg)    Physical Exam Vitals and nursing note reviewed.  Constitutional:      General: He is not in acute distress.    Appearance: He is well-developed. He is not diaphoretic.  Eyes:     General: No scleral icterus.    Conjunctiva/sclera: Conjunctivae normal.  Neck:     Thyroid: No thyromegaly.  Cardiovascular:     Rate and Rhythm: Normal rate and regular rhythm.     Heart sounds: Normal heart sounds. No murmur heard.   Pulmonary:     Effort:  Pulmonary effort is normal. No respiratory distress.     Breath sounds: Normal breath sounds. No wheezing.  Musculoskeletal:     Cervical back: Neck supple.  Lymphadenopathy:     Cervical: No cervical adenopathy.  Skin:    General: Skin is warm and dry.     Findings: No rash.  Neurological:     Mental Status: He is alert and oriented to person, place, and time.     Coordination: Coordination normal.  Psychiatric:        Behavior: Behavior normal.       Assessment &  Plan:   Problem List Items Addressed This Visit      Cardiovascular and Mediastinum   CVA (cerebral vascular accident) ([place]) - Primary   Relevant Medications   clopidogrel (PLAVIX) 75 MG tablet   pravastatin (PRAVACHOL) 40 MG tablet   Other Relevant Orders   CBC with Differential/Platelet (Completed)   CMP14+EGFR (Completed)   TSH (Completed)      Changed to pravastatin because of weakness and fatigue, gave trazadone for sleep. Follow up plan: Return in about 3 months (around 02/16/2020), or if symptoms worsen or fail to improve, for recheck cholesterol.  Counseling provided for all of the vaccine components No orders of the defined types were placed in this encounter.   Caryl Pina, MD Home Medicine 11/16/2019, 1:55 PM

## 2019-11-17 LAB — CBC WITH DIFFERENTIAL/PLATELET
Basophils Absolute: 0.1 10*3/uL (ref 0.0–0.2)
Basos: 1 %
EOS (ABSOLUTE): 0.1 10*3/uL (ref 0.0–0.4)
Eos: 1 %
Hematocrit: 51.8 % — ABNORMAL HIGH (ref 37.5–51.0)
Hemoglobin: 17.7 g/dL (ref 13.0–17.7)
Immature Grans (Abs): 0.1 10*3/uL (ref 0.0–0.1)
Immature Granulocytes: 1 %
Lymphocytes Absolute: 3 10*3/uL (ref 0.7–3.1)
Lymphs: 24 %
MCH: 31.9 pg (ref 26.6–33.0)
MCHC: 34.2 g/dL (ref 31.5–35.7)
MCV: 94 fL (ref 79–97)
Monocytes Absolute: 0.8 10*3/uL (ref 0.1–0.9)
Monocytes: 7 %
Neutrophils Absolute: 8.2 10*3/uL — ABNORMAL HIGH (ref 1.4–7.0)
Neutrophils: 66 %
Platelets: 220 10*3/uL (ref 150–450)
RBC: 5.54 x10E6/uL (ref 4.14–5.80)
RDW: 12.6 % (ref 11.6–15.4)
WBC: 12.1 10*3/uL — ABNORMAL HIGH (ref 3.4–10.8)

## 2019-11-17 LAB — CMP14+EGFR
ALT: 24 IU/L (ref 0–44)
AST: 22 IU/L (ref 0–40)
Albumin/Globulin Ratio: 2.2 (ref 1.2–2.2)
Albumin: 4.7 g/dL (ref 3.7–4.7)
Alkaline Phosphatase: 91 IU/L (ref 48–121)
BUN/Creatinine Ratio: 11 (ref 10–24)
BUN: 20 mg/dL (ref 8–27)
Bilirubin Total: 0.4 mg/dL (ref 0.0–1.2)
CO2: 22 mmol/L (ref 20–29)
Calcium: 9.9 mg/dL (ref 8.6–10.2)
Chloride: 102 mmol/L (ref 96–106)
Creatinine, Ser: 1.78 mg/dL — ABNORMAL HIGH (ref 0.76–1.27)
GFR calc Af Amer: 42 mL/min/{1.73_m2} — ABNORMAL LOW (ref >59)
GFR calc non Af Amer: 37 mL/min/{1.73_m2} — ABNORMAL LOW (ref >59)
Globulin, Total: 2.1 g/dL (ref 1.5–4.5)
Glucose: 115 mg/dL — ABNORMAL HIGH (ref 65–99)
Potassium: 4.9 mmol/L (ref 3.5–5.2)
Sodium: 138 mmol/L (ref 134–144)
Total Protein: 6.8 g/dL (ref 6.0–8.5)

## 2019-11-17 LAB — TSH: TSH: 5.16 u[IU]/mL — ABNORMAL HIGH (ref 0.450–4.500)

## 2019-11-19 ENCOUNTER — Other Ambulatory Visit: Payer: Self-pay | Admitting: *Deleted

## 2019-11-19 ENCOUNTER — Ambulatory Visit (INDEPENDENT_AMBULATORY_CARE_PROVIDER_SITE_OTHER): Payer: Medicare HMO | Admitting: Emergency Medicine

## 2019-11-19 ENCOUNTER — Other Ambulatory Visit: Payer: Self-pay

## 2019-11-19 DIAGNOSIS — I639 Cerebral infarction, unspecified: Secondary | ICD-10-CM

## 2019-11-19 LAB — CUP PACEART INCLINIC DEVICE CHECK
Date Time Interrogation Session: 20210722121711
Implantable Pulse Generator Implant Date: 20210708

## 2019-11-19 MED ORDER — LEVOTHYROXINE SODIUM 25 MCG PO TABS: ORAL_TABLET | ORAL | 1 refills | Status: DC

## 2019-11-19 NOTE — Progress Notes (Signed)
ILR wound check in clinic. Steri strips removed. Wound well healed. Home monitor transmitting nightly. No episodes. Questions answered.  

## 2019-11-24 ENCOUNTER — Encounter: Payer: Self-pay | Admitting: *Deleted

## 2019-11-30 DIAGNOSIS — J449 Chronic obstructive pulmonary disease, unspecified: Secondary | ICD-10-CM | POA: Diagnosis not present

## 2019-12-01 ENCOUNTER — Ambulatory Visit: Payer: Medicare HMO | Admitting: *Deleted

## 2019-12-01 DIAGNOSIS — E785 Hyperlipidemia, unspecified: Secondary | ICD-10-CM

## 2019-12-01 DIAGNOSIS — I639 Cerebral infarction, unspecified: Secondary | ICD-10-CM

## 2019-12-01 DIAGNOSIS — I10 Essential (primary) hypertension: Secondary | ICD-10-CM

## 2019-12-01 NOTE — Chronic Care Management (AMB) (Signed)
Chronic Care Management   Follow Up Note   12/01/2019 Name: Antonio Bowen MRN: [id number] DOB: [birth year]  Referred by: [name], [name], MD Reason for referral : Chronic Care Management (RN follow up)   Antonio Bowen is a 74 y.o. year old male who is a primary care patient of [name], [name], MD. The CCM team was consulted for assistance with chronic disease management and care coordination needs.    Review of patient status, including review of consultants reports, relevant laboratory and other test results, and collaboration with appropriate care team members and the patient's provider was performed as part of comprehensive patient evaluation and provision of chronic care management services.    SDOH (Social Determinants of Health) assessments performed: No See Care Plan activities for detailed interventions related to [facility]     Outpatient Encounter Medications as of 12/01/2019  Medication Sig Note  . acetaminophen (TYLENOL) 500 MG tablet Take 1,000 mg by mouth 2 (two) times daily as needed (for pain.).   Marland Kitchen albuterol (VENTOLIN HFA) 108 (90 Base) MCG/ACT inhaler Inhale 1-2 puffs into the lungs every 4 (four) hours as needed for wheezing or shortness of breath. INHALE TWO PUFFS INTO LUNGS EVERY 6 HOURS AS NEEDED FOR WHEEZING   . budesonide-formoterol (SYMBICORT) 160-4.5 MCG/ACT inhaler Inhale 2 puffs into the lungs in the morning and at bedtime. Needs to be seen for further refills.   . cetirizine (ZYRTEC) 10 MG tablet Take 10 mg by mouth at bedtime.    . clopidogrel (PLAVIX) 75 MG tablet Take 1 tablet (75 mg total) by mouth daily.   . fluticasone (FLONASE) 50 MCG/ACT nasal spray Place 1 spray into both nostrils 2 (two) times daily as needed for allergies or rhinitis.   . Homeopathic Products (LEG CRAMP RELIEF PO) Take 2 tablets by mouth at bedtime. [employer]   . levothyroxine (SYNTHROID) 125 MCG tablet Take 1 tablet (125 mcg total) by mouth daily before breakfast.   .  levothyroxine (SYNTHROID) 25 MCG tablet Take 0.5 tab QD as directed   . losartan-hydrochlorothiazide (HYZAAR) 50-12.5 MG tablet Take 1 tablet by mouth daily.   . magnesium oxide (MAG-OX) 400 MG tablet Take 400 mg by mouth at bedtime.    . meloxicam (MOBIC) 15 MG tablet Take 1 tablet (15 mg total) by mouth daily.   Marland Kitchen omeprazole (PRILOSEC) 40 MG capsule Take 40 mg by mouth in the morning and at bedtime.   . pantoprazole (PROTONIX) 40 MG tablet Take 1 tablet (40 mg total) by mouth 2 (two) times daily before a meal. (Patient not taking: Reported on 11/16/2019)   . pravastatin (PRAVACHOL) 40 MG tablet Take 1 tablet (40 mg total) by mouth daily.   Marland Kitchen PRESCRIPTION MEDICATION Oxygen 3.0 liters at night, prn use in day 11/03/2019: .  Marland Kitchen Respiratory Therapy Supplies (FLUTTER) DEVI Use three times daily   . sucralfate (CARAFATE) 1 g tablet TAKE 1 TABLET BY MOUTH 4 TIMES DAILY (WITH MEALS AND AT BEDTIME) (Patient taking differently: Take 1 g by mouth as needed (ulcers). )   . SUPER B COMPLEX/C PO Take 1 capsule by mouth at bedtime.   . traZODone (DESYREL) 50 MG tablet Take 0.5-1 tablets (25-50 mg total) by mouth at bedtime as needed for sleep.    No facility-administered encounter medications on file as of 12/01/2019.     RN Care Plan    .  "I need a f/u appt with neurologist" (pt-stated)   Not on track  CARE PLAN ENTRY (see longitudinal plan of care for additional care plan information)  Current Barriers:  . Care Coordination needs related to appointment with Neurologist in a patient with HTN, HLD and recent CVA (disease states)  Nurse Case Manager Clinical Goal(s):  Marland Kitchen Over the next 14 days, patient will have an appointment scheduled with neurologist  Interventions:  . Inter-disciplinary care team collaboration (see longitudinal plan of care) . Chart reviewed including recent office notes with PCP and cardio, referrals, and lab results o Referral to neurology not visible in chart . Talked with  patient by telephone o Has not received a call regarding neurology referral o Continues to complain of persistent dizziness s/p CVA o Discussed risk for falls r/t dizziness o Patient has not had any falls but does stagger sometimes when starting to walk . Telephone message requesting referral routed to Dr [name] again and to [facility] clinical pools . Collaborated with Dr [name]'s Nurse, Pricilla Riffle, LPN since Dr [name] is out of the office today. He will return tomorrow.  . Provided patient with my direct number and encouraged him to reach out if he has not received call within the next 7 days . Seek emergency medical care if necessary and report any new or worsening symptoms to PCP  Patient Self Care Activities:  . Performs ADL's independently . Performs IADL's independently  Please see past updates related to this goal by clicking on the "Past Updates" button in the selected goal          Plan:   The care management team will reach out to the patient again over the next 30 days.    [name], BSN, RN-BC Embedded Chronic Care Manager [facility] [place] Management Direct Dial: [phone]

## 2019-12-01 NOTE — Telephone Encounter (Signed)
12/01/2019  2nd follow-up call with patient s/p hospitalization due to CVA. He continues to have dizziness since CVA and some days he feels that it is getting worse instead of better. He has not had a visit with neurology and needs a referral placed. This was recommended at hospital discharge but was not ordered.   Forwarding to PCP and [facility] Clinical staff for review and action.   [name], BSN, RN-BC Embedded Chronic Care Manager [facility] / [place] [facility] Direct Dial: [phone]

## 2019-12-01 NOTE — Telephone Encounter (Signed)
Placed referral for the patient. 

## 2019-12-01 NOTE — Patient Instructions (Signed)
Visit Information  Goals Addressed              This Visit's Progress     Patient Stated   .  "I need a f/u appt with neurologist" (pt-stated)   Not on track     [place] (see longitudinal plan of care for additional care plan information)  Current Barriers:  . Care Coordination needs related to appointment with Neurologist in a patient with HTN, HLD and recent CVA (disease states)  Nurse Case Manager Clinical Goal(s):  Marland Kitchen Over the next 14 days, patient will have an appointment scheduled with neurologist  Interventions:  . Inter-disciplinary care team collaboration (see longitudinal plan of care) . Chart reviewed including recent office notes with PCP and cardio, referrals, and lab results o Referral to neurology not visible in chart . Talked with patient by telephone o Has not received a call regarding neurology referral o Continues to complain of persistent dizziness s/p CVA o Discussed risk for falls r/t dizziness o Patient has not had any falls but does stagger sometimes when starting to walk . Telephone message requesting referral routed to Dr [name] again and to [facility] clinical pools . Collaborated with Dr [name]'s Nurse, Pricilla Riffle, LPN since Dr [name] is out of the office today. He will return tomorrow.  . Provided patient with my direct number and encouraged him to reach out if he has not received call within the next 7 days . Seek emergency medical care if necessary and report any new or worsening symptoms to PCP  Patient Self Care Activities:  . Performs ADL's independently . Performs IADL's independently  Please see past updates related to this goal by clicking on the "Past Updates" button in the selected goal         Patient verbalizes understanding of instructions provided today.   Follow-up Plan The care management team will reach out to the patient again over the next 30 days.   [name], BSN, RN-BC Embedded Chronic Care  Manager [facility] [place] [facility] Direct Dial: [phone]

## 2019-12-03 ENCOUNTER — Telehealth: Payer: Self-pay | Admitting: Neurology

## 2019-12-03 NOTE — Telephone Encounter (Signed)
Dr. Leonie Man please advise pt is scheduled post CVA with you for October 5th. Could you review and advise if patient needs to be seen sooner or if October 5th is ok?  Thank you

## 2019-12-04 ENCOUNTER — Telehealth: Payer: Medicare HMO

## 2019-12-16 ENCOUNTER — Ambulatory Visit: Payer: Medicare HMO | Admitting: Family Medicine

## 2019-12-21 ENCOUNTER — Ambulatory Visit (INDEPENDENT_AMBULATORY_CARE_PROVIDER_SITE_OTHER): Payer: Medicare HMO | Admitting: *Deleted

## 2019-12-21 DIAGNOSIS — I639 Cerebral infarction, unspecified: Secondary | ICD-10-CM | POA: Diagnosis not present

## 2019-12-22 LAB — CUP PACEART REMOTE DEVICE CHECK
Date Time Interrogation Session: 20210824040322
Implantable Pulse Generator Implant Date: 20210708

## 2019-12-25 NOTE — Progress Notes (Signed)
Carelink Summary Report / Loop Recorder 

## 2019-12-30 ENCOUNTER — Ambulatory Visit: Payer: Medicare HMO | Admitting: Adult Health

## 2019-12-30 ENCOUNTER — Other Ambulatory Visit: Payer: Self-pay

## 2019-12-30 ENCOUNTER — Encounter: Payer: Self-pay | Admitting: Adult Health

## 2019-12-30 VITALS — BP 119/73 | HR 80 | Ht 67.5 in | Wt 199.0 lb

## 2019-12-30 DIAGNOSIS — I639 Cerebral infarction, unspecified: Secondary | ICD-10-CM | POA: Diagnosis not present

## 2019-12-30 DIAGNOSIS — I10 Essential (primary) hypertension: Secondary | ICD-10-CM

## 2019-12-30 DIAGNOSIS — R269 Unspecified abnormalities of gait and mobility: Secondary | ICD-10-CM

## 2019-12-30 DIAGNOSIS — R42 Dizziness and giddiness: Secondary | ICD-10-CM | POA: Diagnosis not present

## 2019-12-30 DIAGNOSIS — F0630 Mood disorder due to known physiological condition, unspecified: Secondary | ICD-10-CM

## 2019-12-30 DIAGNOSIS — I69398 Other sequelae of cerebral infarction: Secondary | ICD-10-CM

## 2019-12-30 DIAGNOSIS — E785 Hyperlipidemia, unspecified: Secondary | ICD-10-CM

## 2019-12-30 MED ORDER — CLONAZEPAM 0.5 MG PO TABS
0.2500 mg | ORAL_TABLET | Freq: Two times a day (BID) | ORAL | 0 refills | Status: DC | PRN
Start: 2019-12-30 — End: 2020-02-02

## 2019-12-30 NOTE — Progress Notes (Signed)
[facility] [address] [place]. [place] [address] [phone]       HOSPITAL FOLLOW UP NOTE  [name] Date of Birth:  1945/07/01 Medical Record Number:  [id number]   Reason for Referral:  hospital stroke follow up    SUBJECTIVE:   CHIEF COMPLAINT:  Chief Complaint  Patient presents with  . Hospitalization Follow-up    tx rm  . Cerebrovascular Accident    pt is here for a f/u on recent strokes. Pt said he is dizzy, and feels like he is walking on ice. Pt is with his wife Antonio Bowen.    HPI:   [name] is a 74 y.o. male with history of tobacco abuse, stroke, low back pain, hypertension, dysrhythmia, arthritis who presented on 11/03/2019 with L arm weakness and numbness.  Stroke work-up revealed right corona radiata and right cerebellar infarcts in 2 different vascular territories, infarcts embolic secondary to unknown source.  Loop recorder placed to assess for atrial fibrillation as potential etiology.  Recommended DAPT for 3 weeks then Plavix alone. Hx of HTN elevated arrival stabilized with long-term BP goal normotensive range.  LDL 34 initiate atorvastatin 40 mg daily.  Other stroke risk factors include advanced age, former tobacco use, EtOH use, obesity, family history of stroke and personal history of stroke prevention.  Other active problems include COPD on home O2.  Evaluated by therapy and discharged home with recommendation of outpatient PT.  Stroke:   R corona radiata and R cerebellar infarcts in 2 different vascular territories, infarcts embolic secondary to unknown source  CT head age indeterminate R cerebellar infarct new from 02/18/2015. New chronic L basal ganglia lacune. Old small R caudate nucleus and L cerebellar infarcts. Small vessel disease. Atrophy. Sinus dz.  MRI  Small R corona radiata infarct. Few acute/subacute R cerebellar infarcts. Small vessel disease.   MRA  Unremarkable   Carotid Doppler  B ICA 1-39% stenosis,  VAs antegrade   2D Echo normal ejection fraction 60 to 65%.  No wall motion abnormalities.    Loop recorder inserted 11/05/2019  LDL 35  HgbA1c 5.7  VTE prophylaxis - Heparin 5000 units sq tid   No antithrombotic prior to admission (no aspirin as on Meloxicam for arthritis), now on clopidogrel 75 mg daily. Listed allergy to aspirin of "upset stomach, bleeding". Dr. Leonie Man recommends DAPT x 3 weeks then plavix alone if pt can tolerate.     Therapy recommendations:  OP PT, no SLP  Disposition:  Return home  Today, 12/30/2019, Antonio Bowen is being seen for hospital follow-up accompanied by his wife  Residual deficits continued left hand weakness with numbness, vertigo, imbalance and anxiety He has since completed therapy but continues to do exercises at home He is frustrated due to intermittent vertigo and imbalance limiting activity.  He does report improvement and is currently ambulating without assistive device previously ambulating with a cane at hospital discharge He has had difficulty sleeping at night due to increased anxiety and feeling as though he is not getting enough oxygen even when wearing 3L via [place].  He feels as though difficulty sleeping at night has prevented him from further improvement.  He has previously used clonazepam in the past with benefit.  Denies new or worsening stroke/TIA symptoms  Completed 3 weeks DAPT and remains on Plavix alone without bleeding or bruising Continues on pravastatin 40 mg daily without myalgias Blood pressure today 119/73 Loop recorder has not shown atrial fibrillation thus far  No  further concerns at this time     ROS:   14 system review of systems performed and negative with exception of see HPI  PMH:  Past Medical History:  Diagnosis Date  . Allergic rhinitis   . Arthritis   . BOOP (bronchiolitis obliterans with organizing pneumonia) ([place])   . Bronchitis   . Colon polyps   . COPD (chronic obstructive pulmonary disease) ([place])   .  Dyspnea    with exertion   . Dysrhythmia    skipped beats- followed by Dr Angelena Form   . GERD (gastroesophageal reflux disease)   . Hypertension   . Hypothyroidism   . Kidney stone   . Low back pain   . Oxygen dependent    uses 3L/[place] at nite occasional during day if needed   . Stroke [facility])    hx of 2 strokes which patient did not know he had had   . Thyroid disease   . Tobacco dependence     PSH:  Past Surgical History:  Procedure Laterality Date  . BIOPSY  08/18/2019   Procedure: BIOPSY;  Surgeon: Irene Shipper, MD;  Location: [facility];  Service: Endoscopy;;  . broncoscopy     . carpel tunnal     left  . COLONOSCOPY     x 4  . DUPUYTREN CONTRACTURE RELEASE     Left  . ESOPHAGOGASTRODUODENOSCOPY (EGD) WITH PROPOFOL N/A 03/26/2016   Procedure: ESOPHAGOGASTRODUODENOSCOPY (EGD) WITH PROPOFOL;  Surgeon: Irene Shipper, MD;  Location: [facility];  Service: Endoscopy;  Laterality: N/A;  . ESOPHAGOGASTRODUODENOSCOPY (EGD) WITH PROPOFOL N/A 08/18/2019   Procedure: ESOPHAGOGASTRODUODENOSCOPY (EGD) WITH PROPOFOL;  Surgeon: Irene Shipper, MD;  Location: [facility];  Service: Endoscopy;  Laterality: N/A;  Possible dilation  . ESOPHAGOGASTRODUODENOSCOPY ENDOSCOPY     x 3  . FACIAL FRACTURE SURGERY    . LOOP RECORDER INSERTION N/A 11/05/2019   Procedure: LOOP RECORDER INSERTION;  Surgeon: Thompson Grayer, MD;  Location: [facility];  Service: Cardiovascular;  Laterality: N/A;  . OTHER SURGICAL HISTORY     facial surgery  . SAVORY DILATION N/A 08/18/2019   Procedure: SAVORY DILATION;  Surgeon: Irene Shipper, MD;  Location: [facility];  Service: Endoscopy;  Laterality: N/A;  . TOTAL HIP ARTHROPLASTY  01/2011   Right hip, [place]  . ULNAR NERVE TRANSPOSITION     left    Social History:  Social History   Socioeconomic History  . Marital status: Married    Spouse name: Not on file  . Number of children: 2  . Years of education: 39  . Highest education level: 11th grade    Occupational History  . 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 Smoking status: Former Smoker    Packs/day: 1.00    Years: 50.00    Pack years: 50.00    Types: Cigarettes    Quit date: 07/30/2010    Years since quitting: 9.4  . Smokeless tobacco: Current User    Types: Snuff  Vaping Use  . Vaping Use: Never used  Substance and Sexual Activity  . Alcohol use: Yes    Comment: rare  . Drug use: No  . Sexual activity: Not on file  Other Topics Concern  . Not on file  Social History Narrative  . Not on file   Social Determinants of Health   Financial Resource Strain:   . Difficulty of Paying  Living Expenses: Not on file  Food Insecurity:   . Worried About Charity fundraiser in the Last Year: Not on file  . Ran Out of Food in the Last Year: Not on file  Transportation Needs:   . Lack of Transportation (Medical): Not on file  . Lack of Transportation (Non-Medical): Not on file  Physical Activity:   . Days of Exercise per Week: Not on file  . Minutes of Exercise per Session: Not on file  Stress:   . Feeling of Stress : Not on file  Social Connections:   . Frequency of Communication with Friends and Family: Not on file  . Frequency of Social Gatherings with Friends and Family: Not on file  . Attends Religious Services: Not on file  . Active Member of Clubs or Organizations: Not on file  . Attends Archivist Meetings: Not on file  . Marital Status: Not on file  Intimate Partner Violence:   . Fear of Current or Ex-Partner: Not on file  . Emotionally Abused: Not on file  . Physically Abused: Not on file  . Sexually Abused: Not on file    Family History:  Family History  Problem Relation Age of Onset  . Arthritis Father   . Stroke Father 18  . Prostate cancer Maternal Grandfather   . Stomach cancer Maternal Grandmother   . Cancer Paternal Grandmother   . Colon cancer Other        mat  1st cousin  . Cancer Mother   . Healthy Daughter   . Healthy Son   . Pancreatic cancer Neg Hx   . Esophageal cancer Neg Hx     Medications:   Current Outpatient Medications on File Prior to Visit  Medication Sig Dispense Refill  . acetaminophen (TYLENOL) 500 MG tablet Take 1,000 mg by mouth 2 (two) times daily as needed (for pain.).    Marland Kitchen albuterol (VENTOLIN HFA) 108 (90 Base) MCG/ACT inhaler Inhale 1-2 puffs into the lungs every 4 (four) hours as needed for wheezing or shortness of breath. INHALE TWO PUFFS INTO LUNGS EVERY 6 HOURS AS NEEDED FOR WHEEZING 18 g 3  . budesonide-formoterol (SYMBICORT) 160-4.5 MCG/ACT inhaler Inhale 2 puffs into the lungs in the morning and at bedtime. Needs to be seen for further refills. 3 Inhaler 3  . cetirizine (ZYRTEC) 10 MG tablet Take 10 mg by mouth at bedtime.     . clopidogrel (PLAVIX) 75 MG tablet Take 1 tablet (75 mg total) by mouth daily. 90 tablet 3  . fluticasone (FLONASE) 50 MCG/ACT nasal spray Place 1 spray into both nostrils 2 (two) times daily as needed for allergies or rhinitis. 48 g 3  . Homeopathic Products (LEG CRAMP RELIEF PO) Take 2 tablets by mouth at bedtime. Hyland's    . levothyroxine (SYNTHROID) 125 MCG tablet Take 1 tablet (125 mcg total) by mouth daily before breakfast. 90 tablet 3  . levothyroxine (SYNTHROID) 25 MCG tablet Take 0.5 tab QD as directed 45 tablet 1  . losartan-hydrochlorothiazide (HYZAAR) 50-12.5 MG tablet Take 1 tablet by mouth daily. 90 tablet 3  . magnesium oxide (MAG-OX) 400 MG tablet Take 400 mg by mouth at bedtime.     . meloxicam (MOBIC) 15 MG tablet Take 1 tablet (15 mg total) by mouth daily. 90 tablet 3  . omeprazole (PRILOSEC) 40 MG capsule Take 40 mg by mouth in the morning and at bedtime.    . pantoprazole (PROTONIX) 40 MG tablet Take 1 tablet (40  mg total) by mouth 2 (two) times daily before a meal. 60 tablet 0  . pravastatin (PRAVACHOL) 40 MG tablet Take 1 tablet (40 mg total) by mouth daily. 90 tablet 3   . PRESCRIPTION MEDICATION Oxygen 3.0 liters at night, prn use in day    . Respiratory Therapy Supplies (FLUTTER) DEVI Use three times daily 1 each 0  . sucralfate (CARAFATE) 1 g tablet TAKE 1 TABLET BY MOUTH 4 TIMES DAILY (WITH MEALS AND AT BEDTIME) (Patient taking differently: Take 1 g by mouth as needed (ulcers). ) 120 tablet 2  . SUPER B COMPLEX/C PO Take 1 capsule by mouth at bedtime.    . traZODone (DESYREL) 50 MG tablet Take 0.5-1 tablets (25-50 mg total) by mouth at bedtime as needed for sleep. 90 tablet 3   No current facility-administered medications on file prior to visit.    Allergies:   Allergies  Allergen Reactions  . Nuts Nausea And Vomiting    sweating  . Other Nausea And Vomiting    Per patient, cannot take any narcotic. Nausea and vomiting sweating  . Vicodin [Hydrocodone-Acetaminophen]     tachycardia  . Codeine   . Aspirin Other (See Comments)    Upset stomach, bleeding  . Penicillins Hives and Rash    Has patient had a PCN reaction causing immediate rash, facial/tongue/throat swelling, SOB or lightheadedness with hypotension:No Has patient had a PCN reaction causing severe rash involving mucus membranes or skin necrosis:No Has patient had a PCN reaction that required hospitalization:No Has patient had a PCN reaction occurring within the last 10 years:Yes If all of the above answers are "NO", then may proceed with Cephalosporin use.       OBJECTIVE:  Physical Exam  Vitals:   12/30/19 1310  BP: 119/73  Pulse: 80  Weight: 199 lb (90.3 kg)  Height: 5' 7.5" (1.715 m)   Body mass index is 30.71 kg/m. No exam data present  General: well developed, well nourished,  pleasant elderly Caucasian male, seated, in no evident distress Head: head normocephalic and atraumatic.   Neck: supple with no carotid or supraclavicular bruits Cardiovascular: regular rate and rhythm, no murmurs Musculoskeletal: no deformity Skin:  no rash/petichiae Vascular:  Normal  pulses all extremities   Neurologic Exam Mental Status: Awake and fully alert.   Fluent speech and language.  Oriented to place and time. Recent and remote memory intact. Attention span, concentration and fund of knowledge appropriate. Mood and affect appropriate.  Cranial Nerves: Fundoscopic exam reveals sharp disc margins. Pupils equal, briskly reactive to light. Extraocular movements full without nystagmus. Visual fields full to confrontation. Hearing intact. Facial sensation intact. Face, tongue, palate moves normally and symmetrically.  Motor: Normal bulk and tone. Normal strength in all tested extremity muscles except slightly decreased left hand dexterity and increased tone. Sensory.: intact to touch , pinprick , position and vibratory sensation.  Subjective left arm mild numbness sensation Coordination: Rapid alternating movements normal in all extremities except decreased left hand. Finger-to-nose and heel-to-shin performed accurately bilaterally. Gait and Station: Arises from chair without difficulty. Stance is normal. Gait demonstrates  broad-based gait with favoring of right hip (hx of R hip replacement) and mild unsteadiness.  Difficulty performing tandem walk and unable to stand on single leg alone.  Romberg positive. Reflexes: 1+ and symmetric. Toes downgoing.     NIHSS  0 Modified Rankin  2      ASSESSMENT: SELDEN NOTEBOOM is a 74 y.o. year old male presented with left  arm weakness and numbness on 11/03/2019 with stroke work-up revealing right corona radiata and R cerebellar infarcts in 2 different vascular territories, infarcts embolic secondary to unknown source. Vascular risk factors include HTN, HLD, advanced age, former tobacco use, EtOH use, obesity, history of stroke on imaging and family history of stroke.      PLAN:  1. R CR and cerebellar infarcts, cryptogenic:  a. Residual deficit: Decreased left arm dexterity with numbness, vertigo, imbalance and anxiety.   i. Discussed additional outpatient therapy but he declines due to lack of transportation as he is not driving.  He will continue to do exercises at home and discuss further balance exercises ii. Intermittent vertigo and anxiety: Discussed likely post stroke symptoms which should continue to improve.  Intermittent vertigo episodes impacting daily functioning as well as increased anxiety causing inability to sleep at night.  Recommend short course of clonazepam 0.25 mg twice daily as needed.  Declined interest in SSRI or SNRI at this time but will consider if anxiety continues to be an issue.  Discussed possible side effects with patient and wife of clonazepam and they both wished to proceed b. Continue clopidogrel 75 mg daily  and pravastatin for secondary stroke prevention. c. Loop recorder has not shown atrial fibrillation thus far. d. Close PCP follow up for aggressive stroke risk factor management  2. HTN:  a. BP goal <130/90.  b. Stable c. Managed by PCP 3. HLD:  a. LDL goal <70. Recent LDL 35.  b. Discussed indication with ongoing use of pravastatin despite satisfactory LDL level.  He will continue pravastatin 40 mg daily c. Managed and prescribed by PCP    Follow up in 3 months or call earlier if needed   I spent a prolonged 60 minutes of face-to-face and non-face-to-face time with patient and wife.  This included previsit chart review, lab review, study review, order entry, electronic health record documentation, patient education regarding recent stroke and unknown etiology with indication for loop recorder, residual deficits including increased anxiety, vertigo and balance difficulties, importance of managing stroke risk factors and answered all questions to patient and wife's satisfaction     Frann Rider, AGNP-BC  [facility] [address] [place], [place] [address]  Phone [phone] Fax [phone] Note: This document was prepared with  digital dictation and possible smart phrase technology. Any transcriptional errors that result from this process are unintentional.

## 2019-12-30 NOTE — Patient Instructions (Addendum)
Recommend continuation of exercises at home for hopeful ongoing improvement  Recommend short course of klonopin 0.25mg  twice daily as needed for sleep and severe vertigo  Will continue this course for 1 month. If ongoing refills are needed, please follow up with your PCP  Continue clopidogrel 75 mg daily  and pravastatin  for secondary stroke prevention  Continue to follow up with PCP regarding cholesterol and blood pressure management  Maintain strict control of hypertension with blood pressure goal below 130/90, diabetes with hemoglobin A1c goal below 6.5% and cholesterol with LDL cholesterol (bad cholesterol) goal below 70 mg/dL.       Followup in the future with me in 3 months or call earlier if needed       Thank you for coming to see [place] at [facility]. I hope we have been able to provide you high quality care today.  You may receive a patient satisfaction survey over the next few weeks. We would appreciate your feedback and comments so that we may continue to improve ourselves and the health of our patients.     Cholesterol Content in Foods Cholesterol is a waxy, fat-like substance that helps to carry fat in the blood. The body needs cholesterol in small amounts, but too much cholesterol can cause damage to the arteries and heart. Most people should eat less than 200 milligrams (mg) of cholesterol a day. Foods with cholesterol  Cholesterol is found in animal-based foods, such as meat, seafood, and dairy. Generally, low-fat dairy and lean meats have less cholesterol than full-fat dairy and fatty meats. The milligrams of cholesterol per serving (mg per serving) of common cholesterol-containing foods are listed below. Meat and other proteins  Egg -- one large whole egg has 186 mg.  Veal shank -- 4 oz has 141 mg.  Lean ground [place] (93% lean) -- 4 oz has 118 mg.  Fat-trimmed lamb loin -- 4 oz has 106 mg.  Lean ground beef (90% lean) -- 4 oz has 100  mg.  Lobster -- 3.5 oz has 90 mg.  Pork loin chops -- 4 oz has 86 mg.  Canned salmon -- 3.5 oz has 83 mg.  Fat-trimmed beef top loin -- 4 oz has 78 mg.  Frankfurter -- 1 frank (3.5 oz) has 77 mg.  Crab -- 3.5 oz has 71 mg.  Roasted chicken without skin, white meat -- 4 oz has 66 mg.  Light bologna -- 2 oz has 45 mg.  Deli-cut [place] -- 2 oz has 31 mg.  Canned tuna -- 3.5 oz has 31 mg.  Berniece Salines -- 1 oz has 29 mg.  Oysters and mussels (raw) -- 3.5 oz has 25 mg.  Mackerel -- 1 oz has 22 mg.  Trout -- 1 oz has 20 mg.  Pork sausage -- 1 link (1 oz) has 17 mg.  Salmon -- 1 oz has 16 mg.  Tilapia -- 1 oz has 14 mg. Dairy  Soft-serve ice cream --  cup (4 oz) has 103 mg.  Whole-milk yogurt -- 1 cup (8 oz) has 29 mg.  Cheddar cheese -- 1 oz has 28 mg.  American cheese -- 1 oz has 28 mg.  Whole milk -- 1 cup (8 oz) has 23 mg.  2% milk -- 1 cup (8 oz) has 18 mg.  Cream cheese -- 1 tablespoon (Tbsp) has 15 mg.  Cottage cheese --  cup (4 oz) has 14 mg.  Low-fat (1%) milk -- 1 cup (8 oz) has 10 mg.  Sour cream -- 1 Tbsp has 8.5 mg.  Low-fat yogurt -- 1 cup (8 oz) has 8 mg.  Nonfat Greek yogurt -- 1 cup (8 oz) has 7 mg.  Half-and-half cream -- 1 Tbsp has 5 mg. Fats and oils  Cod liver oil -- 1 tablespoon (Tbsp) has 82 mg.  Butter -- 1 Tbsp has 15 mg.  Lard -- 1 Tbsp has 14 mg.  Bacon grease -- 1 Tbsp has 14 mg.  Mayonnaise -- 1 Tbsp has 5-10 mg.  Margarine -- 1 Tbsp has 3-10 mg. Exact amounts of cholesterol in these foods may vary depending on specific ingredients and brands. Foods without cholesterol Most plant-based foods do not have cholesterol unless you combine them with a food that has cholesterol. Foods without cholesterol include:  Grains and cereals.  Vegetables.  Fruits.  Vegetable oils, such as olive, canola, and sunflower oil.  Legumes, such as peas, beans, and lentils.  Nuts and seeds.  Egg whites. Summary  The body needs  cholesterol in small amounts, but too much cholesterol can cause damage to the arteries and heart.  Most people should eat less than 200 milligrams (mg) of cholesterol a day. This information is not intended to replace advice given to you by your health care provider. Make sure you discuss any questions you have with your health care provider. Document Revised: 03/29/2017 Document Reviewed: 12/11/2016 Elsevier Patient Education  [place].

## 2019-12-31 DIAGNOSIS — J449 Chronic obstructive pulmonary disease, unspecified: Secondary | ICD-10-CM | POA: Diagnosis not present

## 2019-12-31 NOTE — Progress Notes (Signed)
I agree with the above plan 

## 2020-01-20 ENCOUNTER — Ambulatory Visit (INDEPENDENT_AMBULATORY_CARE_PROVIDER_SITE_OTHER): Payer: Medicare HMO | Admitting: Emergency Medicine

## 2020-01-20 DIAGNOSIS — I639 Cerebral infarction, unspecified: Secondary | ICD-10-CM | POA: Diagnosis not present

## 2020-01-25 LAB — CUP PACEART REMOTE DEVICE CHECK
Date Time Interrogation Session: 20210926040259
Implantable Pulse Generator Implant Date: 20210708

## 2020-01-26 NOTE — Progress Notes (Signed)
Carelink Summary Report / Loop Recorder 

## 2020-01-27 NOTE — Chronic Care Management (AMB) (Signed)
  Chronic Care Management   Outreach Note  12/05/2018 Name: Antonio Bowen MRN: [id number] DOB: 04-13-1946  Referred by: [name], [name], MD Reason for referral : Chronic Care Management (RNCM follow up)   An unsuccessful telephone outreach was attempted today. The patient was referred to the case management team for assistance with care management and care coordination.   Follow Up Plan: A HIPAA compliant phone message was left for the patient providing contact information and requesting a return call.  The care management team will reach out to the patient again over the next 30 days.   [name], BSN, RN-BC Embedded Chronic Care Manager [facility] [place] [facility] Direct Dial: [phone]

## 2020-01-29 ENCOUNTER — Other Ambulatory Visit: Payer: Self-pay | Admitting: Adult Health

## 2020-01-30 DIAGNOSIS — J449 Chronic obstructive pulmonary disease, unspecified: Secondary | ICD-10-CM | POA: Diagnosis not present

## 2020-02-02 ENCOUNTER — Inpatient Hospital Stay: Payer: Medicare HMO | Admitting: Neurology

## 2020-02-17 ENCOUNTER — Encounter: Payer: Self-pay | Admitting: Family Medicine

## 2020-02-17 ENCOUNTER — Ambulatory Visit (INDEPENDENT_AMBULATORY_CARE_PROVIDER_SITE_OTHER): Payer: Medicare HMO | Admitting: Family Medicine

## 2020-02-17 VITALS — BP 140/77 | HR 96 | Temp 97.0°F | Ht 67.5 in | Wt 199.0 lb

## 2020-02-17 DIAGNOSIS — M1712 Unilateral primary osteoarthritis, left knee: Secondary | ICD-10-CM | POA: Diagnosis not present

## 2020-02-17 DIAGNOSIS — E039 Hypothyroidism, unspecified: Secondary | ICD-10-CM

## 2020-02-17 DIAGNOSIS — E785 Hyperlipidemia, unspecified: Secondary | ICD-10-CM

## 2020-02-17 DIAGNOSIS — I10 Essential (primary) hypertension: Secondary | ICD-10-CM

## 2020-02-17 MED ORDER — METHYLPREDNISOLONE ACETATE 80 MG/ML IJ SUSP: 80.0000 mg | Freq: Once | INTRAMUSCULAR | Status: DC

## 2020-02-17 NOTE — Progress Notes (Signed)
BP 140/77   Pulse 96   Temp (!) 97 F (36.1 C)   Ht 5' 7.5" (1.715 m)   Wt 199 lb (90.3 kg)   SpO2 95%   BMI 30.71 kg/m    Subjective:   Patient ID: Antonio Bowen, male    DOB: 03/06/1946, 74 y.o.   MRN: [id number]  HPI: Antonio Bowen is a 74 y.o. male presenting on 02/17/2020 for Medical Management of Chronic Issues, Hypothyroidism, and COPD   HPI Hypothyroidism recheck Patient is coming in for thyroid recheck today as well. They deny any issues with hair changes or heat or cold problems or diarrhea or constipation. They deny any chest pain or palpitations. They are currently on levothyroxine 137.5 micrograms   Hypertension Patient is currently on losartan hydrochlorothiazide, and their blood pressure today is 140/77. Patient denies any lightheadedness or dizziness. Patient denies headaches, blurred vision, chest pains, shortness of breath, or weakness. Denies any side effects from medication and is content with current medication.   Hyperlipidemia Patient is coming in for recheck of his hyperlipidemia. The patient is currently taking pravastatin. They deny any issues with myalgias or history of liver damage from it. They deny any focal numbness or weakness or chest pain.   COPD Patient is coming in for COPD recheck today.  He is currently on Symbicort.  He has a mild chronic cough but denies any major coughing spells or wheezing spells.  He has 2nighttime symptoms per week and 1daytime symptoms per week currently.   6 patient comes in complaining of left knee pain that is involving for some time, he has been diagnosed with arthritis in the knee. Is been something that he is fall off and on but has been worsening over the past month. He would like to try the knee injection because it is not getting better on its own. We offered it previously but he did not want it at that time.  Patient is still having a lot of dizziness related to his stroke. He does see neurology and has  been following up with them. He says it is improving gradually. He will continue to work with neurology.  Relevant past medical, surgical, family and social history reviewed and updated as indicated. Interim medical history since our last visit reviewed. Allergies and medications reviewed and updated.  Review of Systems  Constitutional: Negative for chills and fever.  Eyes: Negative for discharge.  Respiratory: Negative for shortness of breath and wheezing.   Cardiovascular: Negative for chest pain and leg swelling.  Musculoskeletal: Positive for arthralgias. Negative for back pain and gait problem.  Skin: Negative for rash.  Neurological: Positive for dizziness, weakness and numbness. Negative for light-headedness.  All other systems reviewed and are negative.   Per HPI unless specifically indicated above   Allergies as of 02/17/2020      Reactions   Nuts Nausea And Vomiting   sweating   Other Nausea And Vomiting   Per patient, cannot take any narcotic. Nausea and vomiting sweating   Vicodin [hydrocodone-acetaminophen]    tachycardia   Codeine    Aspirin Other (See Comments)   Upset stomach, bleeding   Penicillins Hives, Rash   Has patient had a PCN reaction causing immediate rash, facial/tongue/throat swelling, SOB or lightheadedness with hypotension:No Has patient had a PCN reaction causing severe rash involving mucus membranes or skin necrosis:No Has patient had a PCN reaction that required hospitalization:No Has patient had a PCN reaction occurring within the last 10  years:Yes If all of the above answers are "NO", then may proceed with Cephalosporin use.      Medication List       Accurate as of February 17, 2020  2:20 PM. If you have any questions, ask your nurse or doctor.        acetaminophen 500 MG tablet Commonly known as: TYLENOL Take 1,000 mg by mouth 2 (two) times daily as needed (for pain.).   albuterol 108 (90 Base) MCG/ACT inhaler Commonly known as:  Ventolin HFA Inhale 1-2 puffs into the lungs every 4 (four) hours as needed for wheezing or shortness of breath. INHALE TWO PUFFS INTO LUNGS EVERY 6 HOURS AS NEEDED FOR WHEEZING   budesonide-formoterol 160-4.5 MCG/ACT inhaler Commonly known as: Symbicort Inhale 2 puffs into the lungs in the morning and at bedtime. Needs to be seen for further refills.   cetirizine 10 MG tablet Commonly known as: ZYRTEC Take 10 mg by mouth at bedtime.   clonazePAM 0.5 MG tablet Commonly known as: KLONOPIN TAKE 1/2 (ONE-HALF) TABLET BY MOUTH TWICE DAILY AS NEEDED FOR ANXIETY (VERTIGO, SLEEP).   clopidogrel 75 MG tablet Commonly known as: PLAVIX Take 1 tablet (75 mg total) by mouth daily.   fluticasone 50 MCG/ACT nasal spray Commonly known as: FLONASE Place 1 spray into both nostrils 2 (two) times daily as needed for allergies or rhinitis.   Flutter Devi Use three times daily   LEG CRAMP RELIEF PO Take 2 tablets by mouth at bedtime. [employer]'s   levothyroxine 125 MCG tablet Commonly known as: SYNTHROID Take 1 tablet (125 mcg total) by mouth daily before breakfast.   levothyroxine 25 MCG tablet Commonly known as: SYNTHROID Take 0.5 tab QD as directed   losartan-hydrochlorothiazide 50-12.5 MG tablet Commonly known as: HYZAAR Take 1 tablet by mouth daily.   magnesium oxide 400 MG tablet Commonly known as: MAG-OX Take 400 mg by mouth at bedtime.   meloxicam 15 MG tablet Commonly known as: MOBIC Take 1 tablet (15 mg total) by mouth daily.   omeprazole 40 MG capsule Commonly known as: PRILOSEC Take 40 mg by mouth in the morning and at bedtime.   pantoprazole 40 MG tablet Commonly known as: PROTONIX Take 1 tablet (40 mg total) by mouth 2 (two) times daily before a meal.   pravastatin 40 MG tablet Commonly known as: PRAVACHOL Take 1 tablet (40 mg total) by mouth daily.   PRESCRIPTION MEDICATION Oxygen 3.0 liters at night, prn use in day   sucralfate 1 g tablet Commonly known as:  CARAFATE TAKE 1 TABLET BY MOUTH 4 TIMES DAILY (WITH MEALS AND AT BEDTIME) What changed: See the new instructions.   SUPER B COMPLEX/C PO Take 1 capsule by mouth at bedtime.   traZODone 50 MG tablet Commonly known as: DESYREL Take 0.5-1 tablets (25-50 mg total) by mouth at bedtime as needed for sleep.        Objective:   BP 140/77   Pulse 96   Temp (!) 97 F (36.1 C)   Ht 5' 7.5" (1.715 m)   Wt 199 lb (90.3 kg)   SpO2 95%   BMI 30.71 kg/m   Wt Readings from Last 3 Encounters:  02/17/20 199 lb (90.3 kg)  12/30/19 199 lb (90.3 kg)  11/16/19 199 lb (90.3 kg)    Physical Exam Vitals and nursing note reviewed.  Constitutional:      General: He is not in acute distress.    Appearance: He is well-developed. He is not diaphoretic.  Eyes:     General: No scleral icterus.    Conjunctiva/sclera: Conjunctivae normal.  Neck:     Thyroid: No thyromegaly.  Cardiovascular:     Rate and Rhythm: Normal rate and regular rhythm.     Heart sounds: Normal heart sounds. No murmur heard.   Pulmonary:     Effort: Pulmonary effort is normal. No respiratory distress.     Breath sounds: Normal breath sounds. No wheezing.  Musculoskeletal:        General: Normal range of motion.     Cervical back: Neck supple.     Left knee: Tenderness present over the medial joint line and lateral joint line. No LCL laxity, MCL laxity, ACL laxity or PCL laxity. Lymphadenopathy:     Cervical: No cervical adenopathy.  Skin:    General: Skin is warm and dry.     Findings: No rash.  Neurological:     Mental Status: He is alert and oriented to person, place, and time.     Coordination: Coordination normal.  Psychiatric:        Behavior: Behavior normal.    Knee injection: Consent form signed. Risk factors of bleeding and infection discussed with patient and patient is agreeable towards injection. Patient prepped with Betadine. Lateral approach towards injection used. Injected 80 mg of Depo-Medrol and 1  mL of 2% lidocaine. Patient tolerated procedure well and no side effects from noted. Minimal to no bleeding. Simple bandage applied after.    Assessment & Plan:   Problem List Items Addressed This Visit      Cardiovascular and Mediastinum   Hypertension   Relevant Orders   CMP14+EGFR     Endocrine   Hypothyroidism - Primary (Chronic)   Relevant Orders   TSH     Other   Hyperlipidemia LDL goal <130    Other Visit Diagnoses    Primary osteoarthritis of left knee       Relevant Medications   methylPREDNISolone acetate (DEPO-MEDROL) injection 80 mg (Start on 02/17/2020  2:30 PM)      Will do a knee injection for left knee, recheck thyroid today. He will continue to follow with neurology for dizziness. Follow up plan: Return in about 3 months (around 05/19/2020), or if symptoms worsen or fail to improve, for Thyroid and hypertension recheck.  Counseling provided for all of the vaccine components Orders Placed This Encounter  Procedures  . CMP14+EGFR  . TSH    Caryl Pina, MD [facility] 02/17/2020, 2:20 PM

## 2020-02-18 LAB — CMP14+EGFR
ALT: 25 IU/L (ref 0–44)
AST: 27 IU/L (ref 0–40)
Albumin/Globulin Ratio: 2 (ref 1.2–2.2)
Albumin: 4.5 g/dL (ref 3.7–4.7)
Alkaline Phosphatase: 79 IU/L (ref 44–121)
BUN/Creatinine Ratio: 10 (ref 10–24)
BUN: 19 mg/dL (ref 8–27)
Bilirubin Total: 0.5 mg/dL (ref 0.0–1.2)
CO2: 19 mmol/L — ABNORMAL LOW (ref 20–29)
Calcium: 9.8 mg/dL (ref 8.6–10.2)
Chloride: 107 mmol/L — ABNORMAL HIGH (ref 96–106)
Creatinine, Ser: 1.86 mg/dL — ABNORMAL HIGH (ref 0.76–1.27)
GFR calc Af Amer: 40 mL/min/{1.73_m2} — ABNORMAL LOW (ref >59)
GFR calc non Af Amer: 35 mL/min/{1.73_m2} — ABNORMAL LOW (ref >59)
Globulin, Total: 2.3 g/dL (ref 1.5–4.5)
Glucose: 112 mg/dL — ABNORMAL HIGH (ref 65–99)
Potassium: 4.6 mmol/L (ref 3.5–5.2)
Sodium: 143 mmol/L (ref 134–144)
Total Protein: 6.8 g/dL (ref 6.0–8.5)

## 2020-02-18 LAB — TSH: TSH: 1.93 u[IU]/mL (ref 0.450–4.500)

## 2020-02-26 ENCOUNTER — Ambulatory Visit (INDEPENDENT_AMBULATORY_CARE_PROVIDER_SITE_OTHER): Payer: Medicare HMO

## 2020-02-26 DIAGNOSIS — I639 Cerebral infarction, unspecified: Secondary | ICD-10-CM

## 2020-02-26 LAB — CUP PACEART REMOTE DEVICE CHECK
Date Time Interrogation Session: 20211029040514
Implantable Pulse Generator Implant Date: 20210708

## 2020-03-01 DIAGNOSIS — J449 Chronic obstructive pulmonary disease, unspecified: Secondary | ICD-10-CM | POA: Diagnosis not present

## 2020-03-01 NOTE — Progress Notes (Signed)
Carelink Summary Report / Loop Recorder 

## 2020-03-03 ENCOUNTER — Other Ambulatory Visit: Payer: Self-pay | Admitting: Adult Health

## 2020-03-30 ENCOUNTER — Ambulatory Visit (INDEPENDENT_AMBULATORY_CARE_PROVIDER_SITE_OTHER): Payer: Medicare HMO

## 2020-03-30 DIAGNOSIS — I639 Cerebral infarction, unspecified: Secondary | ICD-10-CM | POA: Diagnosis not present

## 2020-03-30 LAB — CUP PACEART REMOTE DEVICE CHECK
Date Time Interrogation Session: 20211201040722
Implantable Pulse Generator Implant Date: 20210708

## 2020-03-31 DIAGNOSIS — J449 Chronic obstructive pulmonary disease, unspecified: Secondary | ICD-10-CM | POA: Diagnosis not present

## 2020-04-05 ENCOUNTER — Ambulatory Visit: Payer: Medicare HMO | Admitting: Adult Health

## 2020-04-05 ENCOUNTER — Other Ambulatory Visit: Payer: Self-pay

## 2020-04-05 VITALS — BP 119/74 | HR 75 | Ht 67.5 in | Wt 201.0 lb

## 2020-04-05 DIAGNOSIS — R269 Unspecified abnormalities of gait and mobility: Secondary | ICD-10-CM

## 2020-04-05 DIAGNOSIS — R42 Dizziness and giddiness: Secondary | ICD-10-CM

## 2020-04-05 DIAGNOSIS — I639 Cerebral infarction, unspecified: Secondary | ICD-10-CM

## 2020-04-05 DIAGNOSIS — F418 Other specified anxiety disorders: Secondary | ICD-10-CM | POA: Diagnosis not present

## 2020-04-05 DIAGNOSIS — I10 Essential (primary) hypertension: Secondary | ICD-10-CM

## 2020-04-05 DIAGNOSIS — I69398 Other sequelae of cerebral infarction: Secondary | ICD-10-CM

## 2020-04-05 DIAGNOSIS — E785 Hyperlipidemia, unspecified: Secondary | ICD-10-CM

## 2020-04-05 MED ORDER — CLONAZEPAM 0.5 MG PO TABS: 0.5000 mg | ORAL_TABLET | Freq: Two times a day (BID) | ORAL | 0 refills | Status: DC | PRN

## 2020-04-05 NOTE — Progress Notes (Signed)
[facility] [address] [place]. [place] [address] [phone]       STROKE FOLLOW UP NOTE  [name] Date of Birth:  Jan 10, 1946 Medical Record Number:  [id number]   Reason for Referral: stroke follow up    SUBJECTIVE:   CHIEF COMPLAINT:  Chief Complaint  Patient presents with  . Follow-up    rm 9  . Cerebrovascular Accident    HPI:   Today, 04/05/2020, Antonio Bowen returns for 47-month stroke follow-up accompanied by his wife.  Complains of residual intermittent dizziness, vertigo and imbalance but greatly improving.  Also reports decreased left hand dexterity and numbness/tingling consistent with ulnar nerve distribution.  Reports history of ulnar nerve decompression in 2016.  He continues to do arm exercises daily and is frustrated as he recently attempted to drive a golf ball but experienced throbbing pain in his left hand.  Intermittent use of clonazepam which has been beneficial in regards to anxiety interfering with sleep.  He has not trialed use of clonazepam with onset of vertigo.  Has been using OTC Dramamine with benefit of dizziness.  Denies new or worsening stroke/TIA symptoms.  Remains on Plavix and pravastatin for secondary stroke prevention without side effects.  Blood pressure today 119/74.  Loop recorder has not shown atrial fibrillation thus far.  No further concerns at this time.   History provided for reference purposes only Initial visit 12/30/2019 JM: Antonio Bowen is being seen for hospital follow-up accompanied by his wife Residual deficits continued left hand weakness with numbness, vertigo, imbalance and anxiety He has since completed therapy but continues to do exercises at home He is frustrated due to intermittent vertigo and imbalance limiting activity.  He does report improvement and is currently ambulating without assistive device previously ambulating with a cane at hospital discharge He has had difficulty sleeping at night due  to increased anxiety and feeling as though he is not getting enough oxygen even when wearing 3L via [place].  He feels as though difficulty sleeping at night has prevented him from further improvement.  He has previously used clonazepam in the past with benefit.  Denies new or worsening stroke/TIA symptoms Completed 3 weeks DAPT and remains on Plavix alone without bleeding or bruising Continues on pravastatin 40 mg daily without myalgias Blood pressure today 119/73 Loop recorder has not shown atrial fibrillation thus far No further concerns at this time  Stroke admission 11/03/2019 [name] is a 74 y.o. male with history of tobacco abuse, stroke, low back pain, hypertension, dysrhythmia, arthritis who presented on 11/03/2019 with L arm weakness and numbness.  Stroke work-up revealed right corona radiata and right cerebellar infarcts in 2 different vascular territories, infarcts embolic secondary to unknown source.  Loop recorder placed to assess for atrial fibrillation as potential etiology.  Recommended DAPT for 3 weeks then Plavix alone. Hx of HTN elevated arrival stabilized with long-term BP goal normotensive range.  LDL 34 initiate atorvastatin 40 mg daily.  Other stroke risk factors include advanced age, former tobacco use, EtOH use, obesity, family history of stroke and personal history of stroke prevention.  Other active problems include COPD on home O2.  Evaluated by therapy and discharged home with recommendation of outpatient PT.  Stroke:   R corona radiata and R cerebellar infarcts in 2 different vascular territories, infarcts embolic secondary to unknown source  CT head age indeterminate R cerebellar infarct new from 02/18/2015. New chronic L basal ganglia lacune. Old small R caudate nucleus  and L cerebellar infarcts. Small vessel disease. Atrophy. Sinus dz.  MRI  Small R corona radiata infarct. Few acute/subacute R cerebellar infarcts. Small vessel disease.   MRA  Unremarkable    Carotid Doppler  B ICA 1-39% stenosis, VAs antegrade   2D Echo normal ejection fraction 60 to 65%.  No wall motion abnormalities.    Loop recorder inserted 11/05/2019  LDL 35  HgbA1c 5.7  VTE prophylaxis - Heparin 5000 units sq tid   No antithrombotic prior to admission (no aspirin as on Meloxicam for arthritis), now on clopidogrel 75 mg daily. Listed allergy to aspirin of "upset stomach, bleeding". Dr. Leonie Man recommends DAPT x 3 weeks then plavix alone if pt can tolerate.     Therapy recommendations:  OP PT, no SLP  Disposition:  Return home     ROS:   14 system review of systems performed and negative with exception of see HPI  PMH:  Past Medical History:  Diagnosis Date  . Allergic rhinitis   . Arthritis   . BOOP (bronchiolitis obliterans with organizing pneumonia) ([place])   . Bronchitis   . Colon polyps   . COPD (chronic obstructive pulmonary disease) ([place])   . Dyspnea    with exertion   . Dysrhythmia    skipped beats- followed by Dr Angelena Form   . GERD (gastroesophageal reflux disease)   . Hypertension   . Hypothyroidism   . Kidney stone   . Low back pain   . Oxygen dependent    uses 3L/[place] at nite occasional during day if needed   . Stroke [facility])    hx of 2 strokes which patient did not know he had had   . Thyroid disease   . Tobacco dependence     PSH:  Past Surgical History:  Procedure Laterality Date  . BIOPSY  08/18/2019   Procedure: BIOPSY;  Surgeon: Irene Shipper, MD;  Location: [facility];  Service: Endoscopy;;  . broncoscopy     . carpel tunnal     left  . COLONOSCOPY     x 4  . DUPUYTREN CONTRACTURE RELEASE     Left  . ESOPHAGOGASTRODUODENOSCOPY (EGD) WITH PROPOFOL N/A 03/26/2016   Procedure: ESOPHAGOGASTRODUODENOSCOPY (EGD) WITH PROPOFOL;  Surgeon: Irene Shipper, MD;  Location: [facility];  Service: Endoscopy;  Laterality: N/A;  . ESOPHAGOGASTRODUODENOSCOPY (EGD) WITH PROPOFOL N/A 08/18/2019   Procedure: ESOPHAGOGASTRODUODENOSCOPY (EGD)  WITH PROPOFOL;  Surgeon: Irene Shipper, MD;  Location: [facility];  Service: Endoscopy;  Laterality: N/A;  Possible dilation  . ESOPHAGOGASTRODUODENOSCOPY ENDOSCOPY     x 3  . FACIAL FRACTURE SURGERY    . LOOP RECORDER INSERTION N/A 11/05/2019   Procedure: LOOP RECORDER INSERTION;  Surgeon: Thompson Grayer, MD;  Location: [facility];  Service: Cardiovascular;  Laterality: N/A;  . OTHER SURGICAL HISTORY     facial surgery  . SAVORY DILATION N/A 08/18/2019   Procedure: SAVORY DILATION;  Surgeon: Irene Shipper, MD;  Location: [facility];  Service: Endoscopy;  Laterality: N/A;  . TOTAL HIP ARTHROPLASTY  01/2011   Right hip, [place]  . ULNAR NERVE TRANSPOSITION     left    Social History:  Social History   Socioeconomic History  . Marital status: Married    Spouse name: Not on file  . Number of children: 2  . Years of education: 16  . Highest education level: 11th grade  Occupational History  . 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 Smoking status: Former Smoker    Packs/day: 1.00    Years: 50.00    Pack years: 50.00    Types: Cigarettes    Quit date: 07/30/2010    Years since quitting: 9.6  . Smokeless tobacco: Current User    Types: Snuff  Vaping Use  . Vaping Use: Never used  Substance and Sexual Activity  . Alcohol use: Yes    Comment: rare  . Drug use: No  . Sexual activity: Not on file  Other Topics Concern  . Not on file  Social History Narrative  . Not on file   Social Determinants of Health   Financial Resource Strain:   . Difficulty of Paying Living Expenses: Not on file  Food Insecurity:   . Worried About Charity fundraiser in the Last Year: Not on file  . Ran Out of Food in the Last Year: Not on file  Transportation Needs:   . Lack of Transportation (Medical): Not on file  . Lack of Transportation (Non-Medical): Not on file  Physical Activity:   . Days of Exercise per  Week: Not on file  . Minutes of Exercise per Session: Not on file  Stress:   . Feeling of Stress : Not on file  Social Connections:   . Frequency of Communication with Friends and Family: Not on file  . Frequency of Social Gatherings with Friends and Family: Not on file  . Attends Religious Services: Not on file  . Active Member of Clubs or Organizations: Not on file  . Attends Archivist Meetings: Not on file  . Marital Status: Not on file  Intimate Partner Violence:   . Fear of Current or Ex-Partner: Not on file  . Emotionally Abused: Not on file  . Physically Abused: Not on file  . Sexually Abused: Not on file    Family History:  Family History  Problem Relation Age of Onset  . Arthritis Father   . Stroke Father 35  . Prostate cancer Maternal Grandfather   . Stomach cancer Maternal Grandmother   . Cancer Paternal Grandmother   . Colon cancer Other        mat 1st cousin  . Cancer Mother   . Healthy Daughter   . Healthy Son   . Pancreatic cancer Neg Hx   . Esophageal cancer Neg Hx     Medications:   Current Outpatient Medications on File Prior to Visit  Medication Sig Dispense Refill  . acetaminophen (TYLENOL) 500 MG tablet Take 1,000 mg by mouth 2 (two) times daily as needed (for pain.).    Marland Kitchen albuterol (VENTOLIN HFA) 108 (90 Base) MCG/ACT inhaler Inhale 1-2 puffs into the lungs every 4 (four) hours as needed for wheezing or shortness of breath. INHALE TWO PUFFS INTO LUNGS EVERY 6 HOURS AS NEEDED FOR WHEEZING 18 g 3  . budesonide-formoterol (SYMBICORT) 160-4.5 MCG/ACT inhaler Inhale 2 puffs into the lungs in the morning and at bedtime. Needs to be seen for further refills. 3 Inhaler 3  . cetirizine (ZYRTEC) 10 MG tablet Take 10 mg by mouth at bedtime.     . clonazePAM (KLONOPIN) 0.5 MG tablet Take 0.5 tablets (0.25 mg total) by mouth 2 (two) times daily as needed (dizziness/vertigo). 30 tablet 0  . clopidogrel (PLAVIX) 75 MG tablet Take 1 tablet (75 mg total) by  mouth daily. 90 tablet 3  . fluticasone (FLONASE) 50 MCG/ACT nasal spray Place  1 spray into both nostrils 2 (two) times daily as needed for allergies or rhinitis. 48 g 3  . Homeopathic Products (LEG CRAMP RELIEF PO) Take 2 tablets by mouth at bedtime. Hyland's    . levothyroxine (SYNTHROID) 125 MCG tablet Take 1 tablet (125 mcg total) by mouth daily before breakfast. 90 tablet 3  . levothyroxine (SYNTHROID) 25 MCG tablet Take 0.5 tab QD as directed 45 tablet 1  . losartan-hydrochlorothiazide (HYZAAR) 50-12.5 MG tablet Take 1 tablet by mouth daily. 90 tablet 3  . magnesium oxide (MAG-OX) 400 MG tablet Take 400 mg by mouth at bedtime.     . meloxicam (MOBIC) 15 MG tablet Take 1 tablet (15 mg total) by mouth daily. 90 tablet 3  . omeprazole (PRILOSEC) 40 MG capsule Take 40 mg by mouth in the morning and at bedtime.    . pantoprazole (PROTONIX) 40 MG tablet Take 1 tablet (40 mg total) by mouth 2 (two) times daily before a meal. 60 tablet 0  . pravastatin (PRAVACHOL) 40 MG tablet Take 1 tablet (40 mg total) by mouth daily. 90 tablet 3  . PRESCRIPTION MEDICATION Oxygen 3.0 liters at night, prn use in day    . Respiratory Therapy Supplies (FLUTTER) DEVI Use three times daily 1 each 0  . sucralfate (CARAFATE) 1 g tablet TAKE 1 TABLET BY MOUTH 4 TIMES DAILY (WITH MEALS AND AT BEDTIME) (Patient taking differently: Take 1 g by mouth as needed (ulcers). ) 120 tablet 2  . SUPER B COMPLEX/C PO Take 1 capsule by mouth at bedtime.    . traZODone (DESYREL) 50 MG tablet Take 0.5-1 tablets (25-50 mg total) by mouth at bedtime as needed for sleep. 90 tablet 3   Current Facility-Administered Medications on File Prior to Visit  Medication Dose Route Frequency Provider Last Rate Last Admin  . methylPREDNISolone acetate (DEPO-MEDROL) injection 80 mg  80 mg Intra-articular Once [name], [name], MD        Allergies:   Allergies  Allergen Reactions  . Nuts Nausea And Vomiting    sweating  . Other Nausea And  Vomiting    Per patient, cannot take any narcotic. Nausea and vomiting sweating  . Vicodin [Hydrocodone-Acetaminophen]     tachycardia  . Codeine   . Aspirin Other (See Comments)    Upset stomach, bleeding  . Penicillins Hives and Rash    Has patient had a PCN reaction causing immediate rash, facial/tongue/throat swelling, SOB or lightheadedness with hypotension:No Has patient had a PCN reaction causing severe rash involving mucus membranes or skin necrosis:No Has patient had a PCN reaction that required hospitalization:No Has patient had a PCN reaction occurring within the last 10 years:Yes If all of the above answers are "NO", then may proceed with Cephalosporin use.       OBJECTIVE:  Physical Exam  Vitals:   04/05/20 1346  BP: 119/74  Pulse: 75  Weight: 201 lb (91.2 kg)  Height: 5' 7.5" (1.715 m)   Body mass index is 31.02 kg/m. No exam data present  General: well developed, well nourished, pleasant elderly Caucasian male, seated, in no evident distress Head: head normocephalic and atraumatic.   Neck: supple with no carotid or supraclavicular bruits Cardiovascular: regular rate and rhythm, no murmurs Musculoskeletal: no deformity Skin:  no rash/petichiae Vascular:  Normal pulses all extremities   Neurologic Exam Mental Status: Awake and fully alert.   Fluent speech and language.  Oriented to place and time. Recent and remote memory intact. Attention span, concentration  and fund of knowledge appropriate. Mood and affect appropriate.  Cranial Nerves: Pupils equal, briskly reactive to light. Extraocular movements full without nystagmus. Visual fields full to confrontation. Hearing intact. Facial sensation intact. Face, tongue, palate moves normally and symmetrically.  Motor: Normal bulk and tone. Normal strength in all tested extremity muscles except slightly decreased left hand dexterity Sensory.:  Mild sensory impairment along the L ulnar nerve distribution from the  elbow down to pinky and ring finger Coordination: Rapid alternating movements normal in all extremities except decreased left hand. Finger-to-nose shows mild left arm ataxia and heel-to-shin performed accurately bilaterally. Gait and Station: Arises from chair without difficulty. Stance is normal. Gait demonstrates  broad-based gait with favoring of right hip (hx of R hip replacement) and mild unsteadiness.  Improvement with tandem walk and heel toe compared to prior visit.   Romberg positive (falls towards left)  Reflexes: 1+ and symmetric. Toes downgoing.        ASSESSMENT: Antonio Bowen is a 74 y.o. year old male presented with left arm weakness and numbness on 11/03/2019 with stroke work-up revealing right corona radiata and R cerebellar infarcts in 2 different vascular territories, infarcts embolic secondary to unknown source. Vascular risk factors include HTN, HLD, advanced age, former tobacco use, EtOH use, obesity, history of stroke on imaging and family history of stroke.      PLAN:  1. R CR and cerebellar infarcts, cryptogenic:  a. Residual deficit: Decreased left arm dexterity with numbness, imbalance and post stroke anxiety.  Also intermittent vertigo and dizziness. i. Left arm numbness post stroke vs ulnar nerve compression with history of ulnar nerve decompression 2016.  Advised to avoid specific exercises that can irritate nerve.  Declines interest in EMG/NCV currently but if symptoms persist or worsen, may be warranted in the future ii. Imbalance with intermittent vertigo and dizziness: Patient agreeable to do vestibular rehab -referral placed to [facility] for vestibular therapy as patient requests facility closer to his home iii. Anxiety, poststroke: Use of clonazepam at bedtime with benefit in decreasing anxiety previously interfering with sleep.  Advised to trial during increased vertigo or dizziness episode for possible benefit as this medication was initially prescribed  for both dizziness/vertigo and anxiety post stroke.  Educated on long-term concerns with use of clonazepam and may need to consider daily antidepressant medication in the future if symptoms persist.  Refill clonazepam 0.5 mg twice daily as needed for dizziness/vertigo and anxiety. PMP reviewed with last clonazepam refill 03/07/2020 b. Continue clopidogrel 75 mg daily  and pravastatin for secondary stroke prevention. c. Loop recorder has not shown atrial fibrillation thus far -continuously monitored by cardiology d. Discussed secondary stroke prevention measures and importance of close PCP follow up for aggressive stroke risk factor management  2. HTN:  a. BP goal <130/90.  Stable on losartan-hydrochlorothiazide per PCP 3. HLD:  a. LDL goal <70. Recent LDL 35. On pravastatin per PCP    Follow up in 4 months or call earlier if needed   I spent a prolonged 45 minutes of face-to-face and non-face-to-face time with patient and wife.  This included previsit chart review, lab review, study review, order entry, electronic health record documentation, patient education regarding recent stroke and unknown etiology and review of loop recorder, residual deficits including increased anxiety, vertigo and balance difficulties and possible benefit with vestibular rehab, educated on appropriate exercises and body mechanics, discussed importance of managing stroke risk factors and answered all other questions to patient and wife's satisfaction  Frann Rider, AGNP-BC  [facility] [address] [place], [place] [address]  Phone [phone] Fax [phone] Note: This document was prepared with digital dictation and possible smart phrase technology. Any transcriptional errors that result from this process are unintentional.

## 2020-04-05 NOTE — Patient Instructions (Signed)
Recommend vestibular rehab for residual imbalance, dizziness and vertigo  Continue clonazepam for dizziness/vertigo and anxiety/sleep  Continue clopidogrel 75 mg daily  and pravastatin  for secondary stroke prevention  Continue to follow up with PCP regarding cholesterol and blood pressure management  Maintain strict control of hypertension with blood pressure goal below 130/90 and cholesterol with LDL cholesterol (bad cholesterol) goal below 70 mg/dL.       Followup in the future with me in 4 months or call earlier if needed       Thank you for coming to see [place] at [facility]. I hope we have been able to provide you high quality care today.  You may receive a patient satisfaction survey over the next few weeks. We would appreciate your feedback and comments so that we may continue to improve ourselves and the health of our patients.

## 2020-04-06 ENCOUNTER — Encounter: Payer: Self-pay | Admitting: Adult Health

## 2020-04-06 NOTE — Progress Notes (Signed)
I agree with the above plan 

## 2020-04-06 NOTE — Progress Notes (Signed)
Carelink Summary Report / Loop Recorder 

## 2020-04-14 ENCOUNTER — Encounter (HOSPITAL_COMMUNITY): Payer: Self-pay | Admitting: Physical Therapy

## 2020-04-14 ENCOUNTER — Ambulatory Visit (HOSPITAL_COMMUNITY): Payer: Medicare HMO | Attending: Adult Health | Admitting: Physical Therapy

## 2020-04-14 ENCOUNTER — Other Ambulatory Visit: Payer: Self-pay

## 2020-04-14 DIAGNOSIS — R42 Dizziness and giddiness: Secondary | ICD-10-CM | POA: Insufficient documentation

## 2020-04-14 DIAGNOSIS — R2689 Other abnormalities of gait and mobility: Secondary | ICD-10-CM | POA: Diagnosis not present

## 2020-04-14 NOTE — Therapy (Signed)
[place], [place], [address] Phone: [phone]   Fax:  [phone]  Physical Therapy Evaluation  Patient Details  Name: Antonio Bowen MRN: [id number] Date of Birth: [birth year] Referring Provider (PT): Frann Rider NP   Encounter Date: 04/14/2020   PT End of Session - 04/14/20 1620    Visit Number 1    Number of Visits 4    Date for PT Re-Evaluation 05/13/20    Authorization Type [employer] Medicare (no VL)    PT Start Time 1515    PT Stop Time 1615    PT Time Calculation (min) 60 min    Equipment Utilized During Treatment Gait belt    Activity Tolerance Patient tolerated treatment well;Patient limited by fatigue    Behavior During Therapy [facility] for tasks assessed/performed           Past Medical History:  Diagnosis Date  . Allergic rhinitis   . Arthritis   . BOOP (bronchiolitis obliterans with organizing pneumonia) ([place])   . Bronchitis   . Colon polyps   . COPD (chronic obstructive pulmonary disease) ([place])   . Dyspnea    with exertion   . Dysrhythmia    skipped beats- followed by Dr Angelena Form   . GERD (gastroesophageal reflux disease)   . Hypertension   . Hypothyroidism   . Kidney stone   . Low back pain   . Oxygen dependent    uses 3L/[place] at nite occasional during day if needed   . Stroke [facility])    hx of 2 strokes which patient did not know he had had   . Thyroid disease   . Tobacco dependence     Past Surgical History:  Procedure Laterality Date  . BIOPSY  08/18/2019   Procedure: BIOPSY;  Surgeon: Irene Shipper, MD;  Location: [facility];  Service: Endoscopy;;  . broncoscopy     . carpel tunnal     left  . COLONOSCOPY     x 4  . DUPUYTREN CONTRACTURE RELEASE     Left  . ESOPHAGOGASTRODUODENOSCOPY (EGD) WITH PROPOFOL N/A 03/26/2016   Procedure: ESOPHAGOGASTRODUODENOSCOPY (EGD) WITH PROPOFOL;  Surgeon: Irene Shipper, MD;  [facility]: [facility];  Service: Endoscopy;  Laterality: N/A;  .  ESOPHAGOGASTRODUODENOSCOPY (EGD) WITH PROPOFOL N/A 08/18/2019   Procedure: ESOPHAGOGASTRODUODENOSCOPY (EGD) WITH PROPOFOL;  Surgeon: Irene Shipper, MD;  [facility];  Service: Endoscopy;  Laterality: N/A;  Possible dilation  . ESOPHAGOGASTRODUODENOSCOPY ENDOSCOPY     x 3  . FACIAL FRACTURE SURGERY    . LOOP RECORDER INSERTION N/A 11/05/2019   Procedure: LOOP RECORDER INSERTION;  Surgeon: Thompson Grayer, MD;  Location: [place] [facility];  Service: Cardiovascular;  Laterality: N/A;  . OTHER SURGICAL HISTORY     facial surgery  . SAVORY DILATION N/A 08/18/2019   Procedure: SAVORY DILATION;  Surgeon: Irene Shipper, MD;  Location: [facility];  Service: Endoscopy;  Laterality: N/A;  . TOTAL HIP ARTHROPLASTY  01/2011   Right hip, [place]  . ULNAR NERVE TRANSPOSITION     left    There were no vitals filed for this visit.    Subjective Assessment - 04/14/20 1537    Subjective Patient presents to physical therapy with complaint of dizziness post stroke on 11/02/19. Patient was hospitalized and had some inpatient therapy. He says he is doing much better now than he was, he was weak on his LT side then and had much more dizziness. Says he  is still bothered by this sensation and often feels dizzy and light headed. Says symptoms are spontaneous, and last about 5 minutes to all day. Says he is taking Klonopin and Dramamine as needed which he says has helped. He says he feels uncoordinated and unsure of himself. He reports no recent falls, but says he has come close. Says he feels his right eye has become clouded since the stroke also.  Reports no headaches associated with dizziness.    Pertinent History RT cerebellar stroke, low back pain, COPD, RT THA    Limitations Walking;House hold activities    Patient Stated Goals Get back to playing golf    Currently in Pain? No/denies              [facility] PT Assessment - 04/14/20 0001      Assessment   Medical Diagnosis vertigo post RT cerebellar  stroke    Referring Provider (PT) Frann Rider NP    Onset Date/Surgical Date 11/02/19    Prior Therapy Yes inpatient      Precautions   Precautions Other (comment)   loop recorder     Restrictions   Weight Bearing Restrictions No      Balance Screen   Has the patient fallen in the past 6 months No    Has the patient had a decrease in activity level because of a fear of falling?  Yes    Is the patient reluctant to leave their home because of a fear of falling?  No      Home Ec[employer] residence      Prior Function   Level of Independence Independent with basic ADLs      Cognition   Overall Cognitive Status Within Functional Limits for tasks assessed      Observation/Other Assessments   Observations Fatigues quickly with activity, and testing, requires frequent rest breaks      ROM / Strength   AROM / PROM / Strength Strength      Strength   Strength Assessment Site Hip;Knee;Ankle    Right/Left Hip Right;Left    Right Hip Flexion 5/5    Left Hip Flexion 4+/5    Right/Left Knee Right;Left    Right Knee Extension 4+/5    Left Knee Extension 4/5    Right/Left Ankle Right;Left    Right Ankle Dorsiflexion 5/5    Left Ankle Dorsiflexion 4+/5      Ambulation/Gait   Ambulation/Gait Yes    Ambulation/Gait Assistance 5: Supervision    Assistive device None    Gait Pattern Decreased step length - right;Decreased stance time - left;Decreased stride length;Trendelenburg    Ambulation Surface Level;Indoor      Standardized Balance Assessment   Standardized Balance Assessment Dynamic Gait Index      Dynamic Gait Index   Level Surface Mild Impairment    Change in Gait Speed Normal    Gait with Horizontal Head Turns Mild Impairment    Gait with Vertical Head Turns Mild Impairment    Gait and Pivot Turn Mild Impairment    Step Over Obstacle Normal    Step Around Obstacles Normal    Steps Normal    Total Score 20                       Objective measurements completed on examination: See above findings.               PT Education - 04/14/20 1542  Education Details Evaluation findings and POC    Person(s) Educated Patient    Methods Explanation    Comprehension Verbalized understanding            PT Short Term Goals - 04/14/20 1631      PT SHORT TERM GOAL #1   Title Patient will be independent with initial HEP and self-management strategies to improve functional outcomes    Time 2    Period Weeks    Status New    Target Date 04/29/20             PT Long Term Goals - 04/14/20 1631      PT LONG TERM GOAL #1   Title Patient will report at least 75% overall improvement in subjective complaint to indicate improvement in ability to perform ADLs.    Time 4    Period Weeks    Status New    Target Date 05/06/20      PT LONG TERM GOAL #2   Title Patient will score at least 23/24 on DGI to indicate improved dynamic balance and reduced risk for falls.    Time 4    Period Weeks    Status New    Target Date 05/06/20      PT LONG TERM GOAL #3   Title Patient will report no falls since starting therapy to indicate improved balance and functional outcomes    Time 4    Period Weeks    Status New    Target Date 05/06/20      PT LONG TERM GOAL #4   Title Patient will be independent with final HEP and self-management strategies to improve functional outcomes    Time 4    Period Weeks    Status New    Target Date 05/06/20                  Plan - 04/14/20 1628    Clinical Impression Statement Patient is a 74 y.o. male who presents to physical therapy with complaint of dizziness and balance issues. Patient demonstrates decreased balance deficits and gait abnormalities which are negatively impacting patient ability to perform ADLs and functional mobility tasks. Patient will benefit from skilled physical therapy services to address these deficits to improve level of  function with ADLs, functional mobility tasks, and reduce risk for falls.    Personal Factors and Comorbidities Comorbidity 3+    Comorbidities stroke, COPD, chronic LBP    Examination-Activity Limitations Transfers;Locomotion Level    Stability/Clinical Decision Making Stable/Uncomplicated    Clinical Decision Making Low    Rehab Potential Good    PT Frequency 1x / week    PT Duration 4 weeks    PT Treatment/Interventions ADLs/Self Care Home Management;Aquatic Therapy;Biofeedback;Canalith Repostioning;Cryotherapy;Ultrasound;Therapeutic activities;Parrafin;Fluidtherapy;Patient/family education;Cognitive remediation;Manual techniques;Manual lymph drainage;Dry needling;Energy conservation;Splinting;Spinal Manipulations;Visual/perceptual remediation/compensation;Vestibular;Vasopneumatic Device;Joint Manipulations;Taping;Compression bandaging;Orthotic Fit/Training;Therapeutic exercise;Contrast Bath;Electrical Stimulation;Iontophoresis 4mg /ml Dexamethasone;DME Instruction;Balance training;Prosthetic Training;Scar mobilization;Gait training;Neuromuscular re-education;Stair training;Moist Heat;Traction;Functional mobility training;Wheelchair mobility training;Passive range of motion    PT Next Visit Plan Review goals. Initiate ther ex, begin wiht static balance, hip and core strengthening. Progress as tolerated with emphasis on building weekly HEP for home.    PT Home Exercise Plan Issue next visit    Recommended Other Services Possible OT referral, patient mentions he is concerned about the pain and numbness in his LT hand    Consulted and Agree with Plan of Care Patient           Patient will benefit from skilled therapeutic intervention  in order to improve the following deficits and impairments:  Abnormal gait,Improper body mechanics,Decreased balance,Difficulty walking,Decreased activity tolerance,Decreased endurance,Dizziness  Visit Diagnosis: Dizziness and giddiness  Other abnormalities of  gait and mobility     Problem List Patient Active Problem List   Diagnosis Date Noted  . CVA (cerebral vascular accident) ([place]) 11/04/2019  . Acute CVA (cerebrovascular accident) ([place]) 11/03/2019  . Esophageal stricture   . Chronic kidney disease (CKD), stage III (moderate) ([place]) 09/12/2018  . Lateral epicondylitis 07/03/2017  . Pain in joint of left elbow 06/05/2017  . Dysphagia 02/23/2016  . Schatzki's ring 02/23/2016  . Oxygen dependent 02/23/2016  . Hyperlipidemia LDL goal <130 06/01/2015  . BOOP (bronchiolitis obliterans with organizing pneumonia) ([place]) 09/29/2010  . Hearing loss, conductive, bilateral 09/21/2010  . Hypothyroidism 09/21/2010  . Obstructive chronic bronchitis with exacerbation Gold B Copd   . Hypertension   . GERD (gastroesophageal reflux disease)   . Low back pain    4:43 PM, 04/14/20 Josue Hector PT DPT  Physical Therapist with [facility]  [phone] [place] [address] [place], [place], [address] Phone: [phone]   Fax:  [phone]  Name: Antonio Bowen MRN: [id number] Date of Birth: 09-08-1945

## 2020-04-27 ENCOUNTER — Ambulatory Visit: Payer: Medicare HMO | Admitting: Adult Health

## 2020-05-01 DIAGNOSIS — J449 Chronic obstructive pulmonary disease, unspecified: Secondary | ICD-10-CM | POA: Diagnosis not present

## 2020-05-02 ENCOUNTER — Ambulatory Visit (INDEPENDENT_AMBULATORY_CARE_PROVIDER_SITE_OTHER): Payer: Medicare HMO

## 2020-05-02 DIAGNOSIS — I639 Cerebral infarction, unspecified: Secondary | ICD-10-CM | POA: Diagnosis not present

## 2020-05-03 ENCOUNTER — Encounter (HOSPITAL_COMMUNITY): Payer: Self-pay

## 2020-05-03 ENCOUNTER — Other Ambulatory Visit: Payer: Self-pay

## 2020-05-03 ENCOUNTER — Ambulatory Visit (HOSPITAL_COMMUNITY): Payer: Medicare HMO | Attending: Adult Health

## 2020-05-03 DIAGNOSIS — R2689 Other abnormalities of gait and mobility: Secondary | ICD-10-CM | POA: Insufficient documentation

## 2020-05-03 DIAGNOSIS — R42 Dizziness and giddiness: Secondary | ICD-10-CM | POA: Insufficient documentation

## 2020-05-03 LAB — CUP PACEART REMOTE DEVICE CHECK
Date Time Interrogation Session: 20220103040658
Implantable Pulse Generator Implant Date: 20210708

## 2020-05-03 NOTE — Patient Instructions (Addendum)
Knee Extension: Sit to Stand (Eccentric)    Stand close to chair. Slowly lower self to seated position. 5  reps per set, 2 sets per day, at least 4-5 days per week. Progress to stopping midway before lowering to chair. Progress to barely touching chair.  Copyright  VHI. All rights reserved.   Tandem Stance    Right foot in front of left, heel touching toe both feet "straight ahead". Stand on Foot Triangle of Support with both feet. Balance in this position 30 seconds. Do with left foot in front of right.  Repeat 3 sets per day  Copyright  VHI. All rights reserved.   Marching In-Place    Standing straight, alternate bringing knees toward trunk. Bent arms swing alternately. Do 10 reps alternating LE.   Copyright  VHI. All rights reserved.   Band Walk: Side Stepping    Tie band around legs, just above knees. Step 20 feet to one side, then step back to start. Repeat 20 feet per session. Note: Small towel between band and skin eases rubbing.  http://plyo.exer.us/76   Copyright  VHI. All rights reserved.

## 2020-05-03 NOTE — Therapy (Addendum)
[place], [place], [address] Phone: [phone]   Fax:  [phone]  Physical Therapy Treatment  Patient Details  Name: Antonio Bowen MRN: [id number] Date of Birth: Oct 17, 1945 Referring Provider (PT): Frann Rider NP   Encounter Date: 05/03/2020   PT End of Session - 05/03/20 1540    Visit Number 2    Number of Visits 4    Date for PT Re-Evaluation 06/03/20    Authorization Type [employer] Medicare (no VL)    PT Start Time 1535    PT Stop Time 1615    PT Time Calculation (min) 40 min    Equipment Utilized During Treatment Gait belt    Activity Tolerance Patient tolerated treatment well;Patient limited by fatigue    Behavior During Therapy [facility] for tasks assessed/performed           Past Medical History:  Diagnosis Date  . Allergic rhinitis   . Arthritis   . BOOP (bronchiolitis obliterans with organizing pneumonia) ([place])   . Bronchitis   . Colon polyps   . COPD (chronic obstructive pulmonary disease) ([place])   . Dyspnea    with exertion   . Dysrhythmia    skipped beats- followed by Dr Angelena Form   . GERD (gastroesophageal reflux disease)   . Hypertension   . Hypothyroidism   . Kidney stone   . Low back pain   . Oxygen dependent    uses 3L/[place] at nite occasional during day if needed   . Stroke [facility])    hx of 2 strokes which patient did not know he had had   . Thyroid disease   . Tobacco dependence     Past Surgical History:  Procedure Laterality Date  . BIOPSY  08/18/2019   Procedure: BIOPSY;  Surgeon: Irene Shipper, MD;  Location: [facility];  Service: Endoscopy;;  . broncoscopy     . carpel tunnal     left  . COLONOSCOPY     x 4  . DUPUYTREN CONTRACTURE RELEASE     Left  . ESOPHAGOGASTRODUODENOSCOPY (EGD) WITH PROPOFOL N/A 03/26/2016   Procedure: ESOPHAGOGASTRODUODENOSCOPY (EGD) WITH PROPOFOL;  Surgeon: Irene Shipper, MD;  [facility];  Service: Endoscopy;  Laterality: N/A;  .  ESOPHAGOGASTRODUODENOSCOPY (EGD) WITH PROPOFOL N/A 08/18/2019   Procedure: ESOPHAGOGASTRODUODENOSCOPY (EGD) WITH PROPOFOL;  Surgeon: Irene Shipper, MD;  [facility];  Service: Endoscopy;  Laterality: N/A;  Possible dilation  . ESOPHAGOGASTRODUODENOSCOPY ENDOSCOPY     x 3  . FACIAL FRACTURE SURGERY    . LOOP RECORDER INSERTION N/A 11/05/2019   Procedure: LOOP RECORDER INSERTION;  Surgeon: Thompson Grayer, MD;  Location: [place] CV LAB;  Service: Cardiovascular;  Laterality: N/A;  . OTHER SURGICAL HISTORY     facial surgery  . SAVORY DILATION N/A 08/18/2019   Procedure: SAVORY DILATION;  Surgeon: Irene Shipper, MD;  Location: [facility];  Service: Endoscopy;  Laterality: N/A;  . TOTAL HIP ARTHROPLASTY  01/2011   Right hip, [place]  . ULNAR NERVE TRANSPOSITION     left    There were no vitals filed for this visit.   Subjective Assessment - 05/03/20 1539    Subjective Pt reports he spent 3 hours pushing snow yesterday and is sore all over.  Reports minimal dizziness last 3 weeks and none curretly.    Pertinent History RT cerebellar stroke, low back pain, COPD, RT THA    Patient Stated Goals Get back to playing  golf    Currently in Pain? No/denies              [facility] PT Assessment - 05/03/20 0001      Standardized Balance Assessment   Standardized Balance Assessment Dynamic Gait Index      Dynamic Gait Index   Level Surface Mild Impairment    Change in Gait Speed Normal    Gait with Horizontal Head Turns Mild Impairment    Gait with Vertical Head Turns Mild Impairment    Gait and Pivot Turn Normal    Step Over Obstacle Normal    Step Around Obstacles Normal    Steps Normal    Total Score 21                         [facility] Adult PT Treatment/Exercise - 05/03/20 0001      Exercises   Exercises Knee/Hip      Knee/Hip Exercises: Seated   Sit to Sand 2 sets;5 reps;without UE support   continu              Balance Exercises - 05/03/20 0001       Balance Exercises: Standing   Tandem Stance 2 reps;30 secs    Tandem Gait 2 reps    Marching Solid surface;Intermittent upper extremity assist;10 reps   5" holds with 1 HHA            PT Education - 05/03/20 1546    Education Details Reviewed goals, educated importance of HEP, pt able to recall some exercises evaluation therapist gave him last session.  Discussed referral for OT for Lt UE, pt wishes to hold on referral will discuss with MD if needs.    Person(s) Educated Patient    Methods Explanation;Demonstration    Comprehension Verbalized understanding            PT Short Term Goals - 05/03/20 1628      PT SHORT TERM GOAL #1   Title Patient will be independent with initial HEP and self-management strategies to improve functional outcomes    Baseline 05/04/19: Reports he has began figure 8x, tandem gait and stepping over hurdles at home.  Additional exercises added each session             PT Long Term Goals - 05/03/20 1618      PT LONG TERM GOAL #1   Title Patient will report at least 75% overall improvement in subjective complaint to indicate improvement in ability to perform ADLs.    Baseline 05/04/19:  Reports he has began exercises since inital eval and feels improvements by 10%.    Time 4    Period Weeks    Status On-going      PT LONG TERM GOAL #2   Title Patient will score at least 23/24 on DGI to indicate improved dynamic balance and reduced risk for falls.    Baseline 05/03/20:  21/24 DGI    Status On-going      PT LONG TERM GOAL #3   Title Patient will report no falls since starting therapy to indicate improved balance and functional outcomes    Baseline 05/04/19:  Reports no falls in months    Status On-going      PT LONG TERM GOAL #4   Title Patient will be independent with final HEP and self-management strategies to improve functional outcomes  Plan - 05/03/20 1625    Clinical Impression Statement Reviewed goals,  educated importance of HEP compliance, pt able to recall certain exercises gained from last session.  DGI complete 21/24 with no reduction in gait speed during pivot turns, does reduce cadence with head turns.  Session focus on static balance activities and balance associated gait mechanics.  Noted increased antalgic gait following DGI and reports of hip and back pain following long duration with gait, pt educated and encouraged to try out 1/2in heel support on Lt LE to address LLD following Rt hip replacement. Patient has attended 1 of 3 anticipated therapy visits within previous authorization period, and would likely continue to benefit from skilled therapy services to address ongoing deficits to improve functional mobility and reduce risk for future falls   Personal Factors and Comorbidities Comorbidity 3+    Comorbidities stroke, COPD, chronic LBP    Examination-Activity Limitations Transfers;Locomotion Level    Stability/Clinical Decision Making Stable/Uncomplicated    Clinical Decision Making Low    Rehab Potential Good    PT Frequency 1x / week    PT Duration 4 weeks    PT Treatment/Interventions ADLs/Self Care Home Management;Aquatic Therapy;Biofeedback;Canalith Repostioning;Cryotherapy;Ultrasound;Therapeutic activities;Parrafin;Fluidtherapy;Patient/family education;Cognitive remediation;Manual techniques;Manual lymph drainage;Dry needling;Energy conservation;Splinting;Spinal Manipulations;Visual/perceptual remediation/compensation;Vestibular;Vasopneumatic Device;Joint Manipulations;Taping;Compression bandaging;Orthotic Fit/Training;Therapeutic exercise;Contrast Bath;Electrical Stimulation;Iontophoresis 4mg /ml Dexamethasone;DME Instruction;Balance training;Prosthetic Training;Scar mobilization;Gait training;Neuromuscular re-education;Stair training;Moist Heat;Traction;Functional mobility training;Wheelchair mobility training;Passive range of motion    PT Next Visit Plan Continue static balance, hip  and core strengthening. Progress DGI activities. Progress as tolerated with emphasis on building weekly HEP for home.    PT Home Exercise Plan figure 8x, tandem gait and stepping over hurdles; 05/03/20: tandem stance, marching, controlled STS and sidestep with GTB.    Recommended Other Services Possible OT referral, patient mentions he is concerned about the pain, function and numbness in his Lt hand.           Patient will benefit from skilled therapeutic intervention in order to improve the following deficits and impairments:  Abnormal gait,Improper body mechanics,Decreased balance,Difficulty walking,Decreased activity tolerance,Decreased endurance,Dizziness  Visit Diagnosis: Dizziness and giddiness  Other abnormalities of gait and mobility     Problem List Patient Active Problem List   Diagnosis Date Noted  . CVA (cerebral vascular accident) ([place]) 11/04/2019  . Acute CVA (cerebrovascular accident) ([place]) 11/03/2019  . Esophageal stricture   . Chronic kidney disease (CKD), stage III (moderate) ([place]) 09/12/2018  . Lateral epicondylitis 07/03/2017  . Pain in joint of left elbow 06/05/2017  . Dysphagia 02/23/2016  . Schatzki's ring 02/23/2016  . Oxygen dependent 02/23/2016  . Hyperlipidemia LDL goal <130 06/01/2015  . BOOP (bronchiolitis obliterans with organizing pneumonia) ([place]) 09/29/2010  . Hearing loss, conductive, bilateral 09/21/2010  . Hypothyroidism 09/21/2010  . Obstructive chronic bronchitis with exacerbation Gold B Copd   . Hypertension   . GERD (gastroesophageal reflux disease)   . Low back pain    Ihor Austin, LPTA/CLT; CBIS [phone]  Aldona Lento 05/03/2020, 5:31 PM   11:49 AM, 05/05/20 Josue Hector PT DPT  Physical Therapist with [facility]  [phone] [place] [address] [place], [place], [address] Phone: [phone]   Fax:  [phone]  Name: NORVIL BLACKABY MRN: [id number] Date of Birth: 06-Aug-1945

## 2020-05-05 ENCOUNTER — Other Ambulatory Visit: Payer: Self-pay | Admitting: Family Medicine

## 2020-05-05 ENCOUNTER — Other Ambulatory Visit: Payer: Self-pay | Admitting: Adult Health

## 2020-05-05 DIAGNOSIS — I69398 Other sequelae of cerebral infarction: Secondary | ICD-10-CM

## 2020-05-05 DIAGNOSIS — F418 Other specified anxiety disorders: Secondary | ICD-10-CM

## 2020-05-05 NOTE — Addendum Note (Signed)
Addended by: Georges Lynch A on: 05/05/2020 11:51 AM   Modules accepted: Orders

## 2020-05-10 ENCOUNTER — Ambulatory Visit (HOSPITAL_COMMUNITY): Payer: Medicare HMO

## 2020-05-17 ENCOUNTER — Ambulatory Visit (HOSPITAL_COMMUNITY): Payer: Medicare HMO | Admitting: Physical Therapy

## 2020-05-17 ENCOUNTER — Other Ambulatory Visit: Payer: Self-pay

## 2020-05-17 ENCOUNTER — Encounter (HOSPITAL_COMMUNITY): Payer: Self-pay | Admitting: Physical Therapy

## 2020-05-17 DIAGNOSIS — R42 Dizziness and giddiness: Secondary | ICD-10-CM

## 2020-05-17 DIAGNOSIS — R2689 Other abnormalities of gait and mobility: Secondary | ICD-10-CM | POA: Diagnosis not present

## 2020-05-17 NOTE — Therapy (Signed)
[facility] [address] [place], [place], [address] Phone: [phone]   Fax:  [phone]  Physical Therapy Treatment  Patient Details  Name: AITAN ROSSBACH MRN: [id number] Date of Birth: 01-25-1946 Referring Provider (PT): Frann Rider NP  PHYSICAL THERAPY DISCHARGE SUMMARY  Visits from Start of Care: 3  Current functional level related to goals / functional outcomes: See below    Remaining deficits: See below    Education / Equipment:  See assessment  Plan: Patient agrees to discharge.  Patient goals were partially met. Patient is being discharged due to being pleased with the current functional level.  ?????      Encounter Date: 05/17/2020   PT End of Session - 05/17/20 1530    Visit Number 3    Number of Visits 6    Date for PT Re-Evaluation 06/03/20    Authorization Type [employer] (no VL)    PT Start Time 1526    PT Stop Time 1615    PT Time Calculation (min) 49 min    Equipment Utilized During Treatment Gait belt    Activity Tolerance Patient tolerated treatment well;Patient limited by fatigue    Behavior During Therapy WFL for tasks assessed/performed           Past Medical History:  Diagnosis Date  . Allergic rhinitis   . Arthritis   . BOOP (bronchiolitis obliterans with organizing pneumonia) ([place])   . Bronchitis   . Colon polyps   . COPD (chronic obstructive pulmonary disease) ([place])   . Dyspnea    with exertion   . Dysrhythmia    skipped beats- followed by Dr Angelena Form   . GERD (gastroesophageal reflux disease)   . Hypertension   . Hypothyroidism   . Kidney stone   . Low back pain   . Oxygen dependent    uses 3L/[place] at nite occasional during day if needed   . Stroke [facility])    hx of 2 strokes which patient did not know he had had   . Thyroid disease   . Tobacco dependence     Past Surgical History:  Procedure Laterality Date  . BIOPSY  08/18/2019   Procedure: BIOPSY;  Surgeon: Irene Shipper, MD;  Location: [facility];  Service: Endoscopy;;  . broncoscopy     . carpel tunnal     left  . COLONOSCOPY     x 4  . DUPUYTREN CONTRACTURE RELEASE     Left  . ESOPHAGOGASTRODUODENOSCOPY (EGD) WITH PROPOFOL N/A 03/26/2016   Procedure: ESOPHAGOGASTRODUODENOSCOPY (EGD) WITH PROPOFOL;  Surgeon: Irene Shipper, MD;  Location: [facility];  Service: Endoscopy;  Laterality: N/A;  . ESOPHAGOGASTRODUODENOSCOPY (EGD) WITH PROPOFOL N/A 08/18/2019   Procedure: ESOPHAGOGASTRODUODENOSCOPY (EGD) WITH PROPOFOL;  Surgeon: Irene Shipper, MD;  Location: [facility] ENDOS[facility];  Service: Endoscopy;  Laterality: N/A;  Possible dilation  . ESOPHAGOGASTRODUODENOSCOPY ENDOSCOPY     x 3  . FACIAL FRACTURE SURGERY    . LOOP RECORDER INSERTION N/A 11/05/2019   Procedure: LOOP RECORDER INSERTION;  Surgeon: Thompson Grayer, MD;  Location: [facility];  Service: Cardiovascular;  Laterality: N/A;  . OTHER SURGICAL HISTORY     facial surgery  . SAVORY DILATION N/A 08/18/2019   Procedure: SAVORY DILATION;  Surgeon: Irene Shipper, MD;  Location: [facility];  Service: Endoscopy;  Laterality: N/A;  . TOTAL HIP ARTHROPLASTY  01/2011   Right hip, [place]  . ULNAR NERVE TRANSPOSITION     left  There were no vitals filed for this visit.   Subjective Assessment - 05/17/20 1532    Subjective Patient says his dizziness is much better. Says he has "some" issues with balance "not bad". Says he feels it is better than it was. He says his LT knee and ankle have been bothering him lately, he is not sure why. He says his ankle was swollen last week and giving him a hard time. He has been wearing a knee brace to help with this.    Pertinent History RT cerebellar stroke, low back pain, COPD, RT THA    Patient Stated Goals Get back to playing golf    Currently in Pain? Yes    Pain Score 7     Pain Location Knee    Pain Orientation Left    Pain Descriptors / Indicators Aching    Pain Type Acute pain    Pain Onset In the  past 7 days    Pain Frequency Constant    Aggravating Factors  stanidng, walking, bending    Pain Relieving Factors rest, knee brace    Effect of Pain on Daily Activities Limits              [facility] PT Assessment - 05/17/20 0001      Assessment   Medical Diagnosis vertigo post RT cerebellar stroke    Referring Provider (PT) Frann Rider NP    Onset Date/Surgical Date 11/02/19    Prior Therapy Yes inpatient      Precautions   Precautions Other (comment)    Precaution Comments loop recorder      Balance Screen   Has the patient fallen in the past 6 months No      [place] residence      Prior Function   Level of Independence Independent with basic ADLs      Cognition   Overall Cognitive Status Within Functional Limits for tasks assessed      Ambulation/Gait   Ambulation/Gait Yes    Ambulation/Gait Assistance 7: Independent    Assistive device None    Gait Pattern Within Functional Limits    Stairs Yes    Stairs Assistance 7: Independent    Stair Management Technique No rails;Alternating pattern    Number of Stairs 8    Height of Stairs 7      Balance   Balance Assessed Yes      Static Standing Balance   Static Standing Balance -  Activities  Single Leg Stance - Right Leg;Single Leg Stance - Left Leg;Tandam Stance - Right Leg;Tandam Stance - Left Leg    Static Standing - Comment/# of Minutes 12 sec, 8 sec, >30 sec, >30 sec      Dynamic Gait Index   Level Surface Normal    Change in Gait Speed Normal    Gait with Horizontal Head Turns Mild Impairment    Gait with Vertical Head Turns Mild Impairment    Gait and Pivot Turn Normal    Step Over Obstacle Normal    Step Around Obstacles Normal    Steps Normal    Total Score 22                         [facility] Adult PT Treatment/Exercise - 05/17/20 0001      Exercises   Exercises Shoulder      Knee/Hip Exercises: Stretches   Gastroc Stretch Both;3 reps;30  seconds  Gastroc Stretch Limitations slant board      Knee/Hip Exercises: Standing   Heel Raises Both;20 reps    Forward Step Up Both;1 set;10 reps;Hand Hold: 1;Step Height: 6"      Shoulder Exercises: Standing   Extension Both;15 reps;Theraband    Theraband Level (Shoulder Extension) Level 3 (Green)    Row Both;15 reps;Theraband    Theraband Level (Shoulder Row) Level 3 (Green)    Other Standing Exercises band chops GTB x 10 each               Balance Exercises - 05/17/20 0001      Balance Exercises: Standing   Tandem Stance 2 reps;30 secs    SLS Eyes open;Solid surface;5 reps;10 secs               PT Short Term Goals - 05/17/20 1655      PT SHORT TERM GOAL #1   Title Patient will be independent with initial HEP and self-management strategies to improve functional outcomes    Baseline Reports compliance    Status Achieved             PT Long Term Goals - 05/17/20 1655      PT LONG TERM GOAL #1   Title Patient will report at least 75% overall improvement in subjective complaint to indicate improvement in ability to perform ADLs.    Baseline Reports 50% improved    Time 4    Period Weeks    Status Not Met      PT LONG TERM GOAL #2   Title Patient will score at least 23/24 on DGI to indicate improved dynamic balance and reduced risk for falls.    Baseline Current 22/24    Status Not Met      PT LONG TERM GOAL #3   Title Patient will report no falls since starting therapy to indicate improved balance and functional outcomes    Baseline Reports no falls in months    Status Achieved      PT LONG TERM GOAL #4   Title Patient will be independent with final HEP and self-management strategies to improve functional outcomes    Baseline Reviewed, demos good return, verbalizes underdstanding    Status Achieved                 Plan - 05/17/20 1633    Clinical Impression Statement Patient shows moderate improvements despite decreased attendance.  Currently patient does not show any functional limitations with gait or balance, and is unable to state any functional activity that he is unable to do. He does note having some issues with knee pain lately, but this has not impacted his balance. He says he is to follow up with ortho this week for injection. His primary concern remains his LT hand and decreased ability to swing a golf club. Patient was assessed with functional grasp with added band chop, rows and extension. He was able to perform these with no issues today, but still feels his hand limits his function, though he does not mention anything other than golf when prompted. At this time patient will be DC from therapy with max benefit reached. Educated patient on and issued HEP handout. Instructed patient to follow up with physical therapy service with any further issues or needs. Will submit referral form for OT to referring provider for consideration.    Personal Factors and Comorbidities Comorbidity 3+    Comorbidities stroke, COPD, chronic LBP    Examination-Activity  Limitations Transfers;Locomotion Level    Stability/Clinical Decision Making Stable/Uncomplicated    Rehab Potential Good    PT Frequency 1x / week    PT Duration 4 weeks    PT Treatment/Interventions ADLs/Self Care Home Management;Aquatic Therapy;Biofeedback;Canalith Repostioning;Cryotherapy;Ultrasound;Therapeutic activities;Parrafin;Fluidtherapy;Patient/family education;Cognitive remediation;Manual techniques;Manual lymph drainage;Dry needling;Energy conservation;Splinting;Spinal Manipulations;Visual/perceptual remediation/compensation;Vestibular;Vasopneumatic Device;Joint Manipulations;Taping;Compression bandaging;Orthotic Fit/Training;Therapeutic exercise;Contrast Bath;Electrical Stimulation;Iontophoresis 4mg /ml Dexamethasone;DME Instruction;Balance training;Prosthetic Training;Scar mobilization;Gait training;Neuromuscular re-education;Stair training;Moist  Heat;Traction;Functional mobility training;Wheelchair mobility training;Passive range of motion    PT Next Visit Plan DC to HEP    PT Home Exercise Plan figure 8x, tandem gait and stepping over hurdles; 05/03/20: tandem stance, marching, controlled STS and sidestep with GTB. 05/17/20: heel raise, calf stretch    Consulted and Agree with Plan of Care Patient           Patient will benefit from skilled therapeutic intervention in order to improve the following deficits and impairments:  Abnormal gait,Improper body mechanics,Decreased balance,Difficulty walking,Decreased activity tolerance,Decreased endurance,Dizziness  Visit Diagnosis: Dizziness and giddiness  Other abnormalities of gait and mobility     Problem List Patient Active Problem List   Diagnosis Date Noted  . CVA (cerebral vascular accident) ([place]) 11/04/2019  . Acute CVA (cerebrovascular accident) ([place]) 11/03/2019  . Esophageal stricture   . Chronic kidney disease (CKD), stage III (moderate) ([place]) 09/12/2018  . Lateral epicondylitis 07/03/2017  . Pain in joint of left elbow 06/05/2017  . Dysphagia 02/23/2016  . Schatzki's ring 02/23/2016  . Oxygen dependent 02/23/2016  . Hyperlipidemia LDL goal <130 06/01/2015  . BOOP (bronchiolitis obliterans with organizing pneumonia) ([place]) 09/29/2010  . Hearing loss, conductive, bilateral 09/21/2010  . Hypothyroidism 09/21/2010  . Obstructive chronic bronchitis with exacerbation Gold B Copd   . Hypertension   . GERD (gastroesophageal reflux disease)   . Low back pain     4:57 PM, 05/17/20 Josue Hector PT DPT  Physical Therapist with [facility]  [phone] [facility] [address] [place], [place], [address] Phone: [phone]   Fax:  [phone]  Name: SALATHIEL FERRARA MRN: [id number] Date of Birth: [birth year]

## 2020-05-17 NOTE — Progress Notes (Signed)
Carelink Summary Report / Loop Recorder 

## 2020-05-17 NOTE — Patient Instructions (Signed)
Access Code: [email or web address] URL: [email or web address] Date: 05/17/2020 Prepared by: Josue Hector  Exercises Standing Heel Raise with Support - 1-2 x daily - 7 x weekly - 2 sets - 10 reps Gastroc Stretch on Wall - 1-2 x daily - 7 x weekly - 1 sets - 3 reps - 30 sec hold

## 2020-05-19 ENCOUNTER — Encounter: Payer: Self-pay | Admitting: Family Medicine

## 2020-05-19 ENCOUNTER — Ambulatory Visit (INDEPENDENT_AMBULATORY_CARE_PROVIDER_SITE_OTHER): Payer: Medicare HMO | Admitting: Family Medicine

## 2020-05-19 ENCOUNTER — Other Ambulatory Visit: Payer: Self-pay

## 2020-05-19 VITALS — BP 130/73 | HR 95 | Ht 67.5 in | Wt 208.0 lb

## 2020-05-19 DIAGNOSIS — I6930 Unspecified sequelae of cerebral infarction: Secondary | ICD-10-CM | POA: Diagnosis not present

## 2020-05-19 DIAGNOSIS — I10 Essential (primary) hypertension: Secondary | ICD-10-CM

## 2020-05-19 DIAGNOSIS — E785 Hyperlipidemia, unspecified: Secondary | ICD-10-CM

## 2020-05-19 DIAGNOSIS — J441 Chronic obstructive pulmonary disease with (acute) exacerbation: Secondary | ICD-10-CM | POA: Diagnosis not present

## 2020-05-19 DIAGNOSIS — I6389 Other cerebral infarction: Secondary | ICD-10-CM

## 2020-05-19 DIAGNOSIS — E039 Hypothyroidism, unspecified: Secondary | ICD-10-CM

## 2020-05-19 DIAGNOSIS — N1832 Chronic kidney disease, stage 3b: Secondary | ICD-10-CM

## 2020-05-19 MED ORDER — LEVOTHYROXINE SODIUM 125 MCG PO TABS: 125.0000 ug | ORAL_TABLET | Freq: Every day | ORAL | 3 refills | Status: DC

## 2020-05-19 MED ORDER — DICLOFENAC SODIUM 1 % EX GEL
2.0000 g | Freq: Four times a day (QID) | CUTANEOUS | 3 refills | Status: DC
Start: 2020-05-19 — End: 2021-02-09

## 2020-05-19 MED ORDER — FLUTICASONE PROPIONATE 50 MCG/ACT NA SUSP: 1.0000 | Freq: Two times a day (BID) | NASAL | 3 refills | Status: DC | PRN

## 2020-05-19 MED ORDER — CLOPIDOGREL BISULFATE 75 MG PO TABS: 75.0000 mg | ORAL_TABLET | Freq: Every day | ORAL | 3 refills | Status: DC

## 2020-05-19 MED ORDER — PRAVASTATIN SODIUM 40 MG PO TABS: 40.0000 mg | ORAL_TABLET | Freq: Every day | ORAL | 3 refills | Status: DC

## 2020-05-19 MED ORDER — OMEPRAZOLE 40 MG PO CPDR: 40.0000 mg | DELAYED_RELEASE_CAPSULE | Freq: Two times a day (BID) | ORAL | 3 refills | Status: DC

## 2020-05-19 MED ORDER — LOSARTAN POTASSIUM-HCTZ 50-12.5 MG PO TABS: 1.0000 | ORAL_TABLET | Freq: Every day | ORAL | 3 refills | Status: DC

## 2020-05-19 MED ORDER — ALBUTEROL SULFATE HFA 108 (90 BASE) MCG/ACT IN AERS: 1.0000 | INHALATION_SPRAY | RESPIRATORY_TRACT | 3 refills | Status: DC | PRN

## 2020-05-19 MED ORDER — BUDESONIDE-FORMOTEROL FUMARATE 160-4.5 MCG/ACT IN AERO: 2.0000 | INHALATION_SPRAY | Freq: Two times a day (BID) | RESPIRATORY_TRACT | 3 refills | Status: DC

## 2020-05-19 MED ORDER — MELOXICAM 15 MG PO TABS: 15.0000 mg | ORAL_TABLET | Freq: Every day | ORAL | 3 refills | Status: DC

## 2020-05-19 MED ORDER — LEVOTHYROXINE SODIUM 25 MCG PO TABS: ORAL_TABLET | ORAL | 2 refills | Status: DC

## 2020-05-19 MED ORDER — TRAZODONE HCL 50 MG PO TABS: 25.0000 mg | ORAL_TABLET | Freq: Every evening | ORAL | 3 refills | Status: DC | PRN

## 2020-05-19 NOTE — Progress Notes (Signed)
BP 130/73   Pulse 95   Ht 5' 7.5" (1.715 m)   Wt 208 lb (94.3 kg)   SpO2 94%   BMI 32.10 kg/m    Subjective:   Patient ID: Antonio Bowen, male    DOB: 1946/01/31, 75 y.o.   MRN: [id number]  HPI: Antonio Bowen is a 75 y.o. male presenting on 05/19/2020 for Medical Management of Chronic Issues   HPI COPD Patient is coming in for COPD recheck today.  He is currently on albuterol and Symbicort.  He has a mild chronic cough but denies any major coughing spells or wheezing spells.  He has 3nighttime symptoms per week and 5daytime symptoms per week currently.  He says he tried another inhaler that we had given him previously samples 1 it did help him.  Hypothyroidism recheck Patient is coming in for thyroid recheck today as well. They deny any issues with hair changes or heat or cold problems or diarrhea or constipation. They deny any chest pain or palpitations. They are currently on levothyroxine 137 micrograms   Hypertension Patient is currently on losartan hydrochlorothiazide, and their blood pressure today is 130/73. Patient denies any lightheadedness or dizziness. Patient denies headaches, blurred vision, chest pains, shortness of breath, or weakness. Denies any side effects from medication and is content with current medication.   Hyperlipidemia Patient is coming in for recheck of his hyperlipidemia. The patient is currently taking pravastatin. They deny any issues with myalgias or history of liver damage from it. They deny any focal numbness or weakness or chest pain.   CKD stage stage III Patient is coming in today for recheck of CKD, will check blood work for it.  He denies any urinary issues that he knows of.  Relevant past medical, surgical, family and social history reviewed and updated as indicated. Interim medical history since our last visit reviewed. Allergies and medications reviewed and updated.  Review of Systems  Constitutional: Negative for chills and fever.   Eyes: Negative for visual disturbance.  Respiratory: Negative for shortness of breath and wheezing.   Cardiovascular: Negative for chest pain and leg swelling.  Musculoskeletal: Negative for back pain and gait problem.  Skin: Negative for rash.  Neurological: Positive for dizziness and weakness. Negative for light-headedness.  All other systems reviewed and are negative.   Per HPI unless specifically indicated above   Allergies as of 05/19/2020      Reactions   Nuts Nausea And Vomiting   sweating   Other Nausea And Vomiting   Per patient, cannot take any narcotic. Nausea and vomiting sweating   Vicodin [hydrocodone-acetaminophen]    tachycardia   Codeine    Aspirin Other (See Comments)   Upset stomach, bleeding   Penicillins Hives, Rash   Has patient had a PCN reaction causing immediate rash, facial/tongue/throat swelling, SOB or lightheadedness with hypotension:No Has patient had a PCN reaction causing severe rash involving mucus membranes or skin necrosis:No Has patient had a PCN reaction that required hospitalization:No Has patient had a PCN reaction occurring within the last 10 years:Yes If all of the above answers are "NO", then may proceed with Cephalosporin use.      Medication List       Accurate as of May 19, 2020  2:16 PM. If you have any questions, ask your nurse or doctor.        STOP taking these medications   Flutter Devi Stopped by: [name] Jawaan Adachi, MD   pantoprazole 40 MG tablet  Commonly known as: PROTONIX Stopped by: Worthy Rancher, MD     TAKE these medications   acetaminophen 500 MG tablet Commonly known as: TYLENOL Take 1,000 mg by mouth 2 (two) times daily as needed (for pain.).   albuterol 108 (90 Base) MCG/ACT inhaler Commonly known as: Ventolin HFA Inhale 1-2 puffs into the lungs every 4 (four) hours as needed for wheezing or shortness of breath. INHALE TWO PUFFS INTO LUNGS EVERY 6 HOURS AS NEEDED FOR WHEEZING    budesonide-formoterol 160-4.5 MCG/ACT inhaler Commonly known as: Symbicort Inhale 2 puffs into the lungs in the morning and at bedtime. Needs to be seen for further refills.   cetirizine 10 MG tablet Commonly known as: ZYRTEC Take 10 mg by mouth at bedtime.   clonazePAM 0.5 MG tablet Commonly known as: KLONOPIN TAKE 1 TABLET BY MOUTH TWICE DAILY AS NEEDED FOR DIZZINESS/ VERTIGO   clopidogrel 75 MG tablet Commonly known as: PLAVIX Take 1 tablet (75 mg total) by mouth daily.   fluticasone 50 MCG/ACT nasal spray Commonly known as: FLONASE Place 1 spray into both nostrils 2 (two) times daily as needed for allergies or rhinitis.   LEG CRAMP RELIEF PO Take 2 tablets by mouth at bedtime. [employer]'s   levothyroxine 125 MCG tablet Commonly known as: SYNTHROID Take 1 tablet (125 mcg total) by mouth daily before breakfast.   Euthyrox 25 MCG tablet Generic drug: levothyroxine TAKE 1/2 (ONE-HALF) TABLET BY MOUTH ONCE DAILY AS DIRECTED   losartan-hydrochlorothiazide 50-12.5 MG tablet Commonly known as: HYZAAR Take 1 tablet by mouth daily.   magnesium oxide 400 MG tablet Commonly known as: MAG-OX Take 400 mg by mouth at bedtime.   meloxicam 15 MG tablet Commonly known as: MOBIC Take 1 tablet (15 mg total) by mouth daily.   omeprazole 40 MG capsule Commonly known as: PRILOSEC Take 40 mg by mouth in the morning and at bedtime.   pravastatin 40 MG tablet Commonly known as: PRAVACHOL Take 1 tablet (40 mg total) by mouth daily.   PRESCRIPTION MEDICATION Oxygen 3.0 liters at night, prn use in day   sucralfate 1 g tablet Commonly known as: CARAFATE TAKE 1 TABLET BY MOUTH 4 TIMES DAILY (WITH MEALS AND AT BEDTIME) What changed: See the new instructions.   SUPER B COMPLEX/C PO Take 1 capsule by mouth at bedtime.   traZODone 50 MG tablet Commonly known as: DESYREL Take 0.5-1 tablets (25-50 mg total) by mouth at bedtime as needed for sleep.        Objective:   BP 130/73    Pulse 95   Ht 5' 7.5" (1.715 m)   Wt 208 lb (94.3 kg)   SpO2 94%   BMI 32.10 kg/m   Wt Readings from Last 3 Encounters:  05/19/20 208 lb (94.3 kg)  04/05/20 201 lb (91.2 kg)  02/17/20 199 lb (90.3 kg)    Physical Exam Vitals and nursing note reviewed.  Constitutional:      General: He is not in acute distress.    Appearance: He is well-developed and well-nourished. He is not diaphoretic.  Eyes:     General: No scleral icterus.    Extraocular Movements: EOM normal.     Conjunctiva/sclera: Conjunctivae normal.  Neck:     Thyroid: No thyromegaly.  Cardiovascular:     Rate and Rhythm: Normal rate and regular rhythm.     Pulses: Intact distal pulses.     Heart sounds: Normal heart sounds. No murmur heard.   Pulmonary:  Effort: Pulmonary effort is normal. No respiratory distress.     Breath sounds: Normal breath sounds. No wheezing.  Musculoskeletal:        General: No edema.     Cervical back: Neck supple.  Lymphadenopathy:     Cervical: No cervical adenopathy.  Skin:    General: Skin is warm and dry.     Findings: No rash.  Neurological:     Mental Status: He is alert and oriented to person, place, and time.     Coordination: Coordination normal.  Psychiatric:        Mood and Affect: Mood and affect normal.        Behavior: Behavior normal.       Assessment & Plan:   Problem List Items Addressed This Visit      Cardiovascular and Mediastinum   Hypertension   Relevant Medications   losartan-hydrochlorothiazide (HYZAAR) 50-12.5 MG tablet   pravastatin (PRAVACHOL) 40 MG tablet   Other Relevant Orders   CBC with Differential/Platelet   RESOLVED: CVA (cerebral vascular accident) ([place])   Relevant Medications   clopidogrel (PLAVIX) 75 MG tablet   losartan-hydrochlorothiazide (HYZAAR) 50-12.5 MG tablet   pravastatin (PRAVACHOL) 40 MG tablet     Respiratory   Obstructive chronic bronchitis with exacerbation Gold B Copd - Primary   Relevant Medications    fluticasone (FLONASE) 50 MCG/ACT nasal spray   albuterol (VENTOLIN HFA) 108 (90 Base) MCG/ACT inhaler   budesonide-formoterol (SYMBICORT) 160-4.5 MCG/ACT inhaler     Endocrine   Hypothyroidism (Chronic)   Relevant Medications   levothyroxine (SYNTHROID) 125 MCG tablet   levothyroxine (EUTHYROX) 25 MCG tablet   Other Relevant Orders   TSH     Genitourinary   Chronic kidney disease (CKD), stage III (moderate) ([facility])   Relevant Orders   CBC with Differential/Platelet     Other   Hyperlipidemia LDL goal <130   Relevant Medications   losartan-hydrochlorothiazide (HYZAAR) 50-12.5 MG tablet   pravastatin (PRAVACHOL) 40 MG tablet   Other Relevant Orders   Lipid panel    Other Visit Diagnoses    Essential hypertension       Relevant Medications   losartan-hydrochlorothiazide (HYZAAR) 50-12.5 MG tablet   pravastatin (PRAVACHOL) 40 MG tablet   Other Relevant Orders   CMP14+EGFR      Gave samples for Breztri that he can use temporarily instead of the Symbicort to see if it can help a little bit more.  He says it helped him more than the last time we give him a sample of it.  Patient will come back in 1 month for knee injection, it has not been quite long enough. Follow up plan: Return in about 3 months (around 08/17/2020), or if symptoms worsen or fail to improve, for Hypertension COPD and hyperlipidemia and thyroid.  Counseling provided for all of the vaccine components No orders of the defined types were placed in this encounter.   Caryl Pina, MD [facility] 05/19/2020, 2:16 PM

## 2020-05-20 LAB — CMP14+EGFR
ALT: 20 IU/L (ref 0–44)
AST: 20 IU/L (ref 0–40)
Albumin/Globulin Ratio: 2 (ref 1.2–2.2)
Albumin: 4.3 g/dL (ref 3.7–4.7)
Alkaline Phosphatase: 80 IU/L (ref 44–121)
BUN/Creatinine Ratio: 9 — ABNORMAL LOW (ref 10–24)
BUN: 15 mg/dL (ref 8–27)
Bilirubin Total: 0.5 mg/dL (ref 0.0–1.2)
CO2: 22 mmol/L (ref 20–29)
Calcium: 9.2 mg/dL (ref 8.6–10.2)
Chloride: 105 mmol/L (ref 96–106)
Creatinine, Ser: 1.71 mg/dL — ABNORMAL HIGH (ref 0.76–1.27)
GFR calc Af Amer: 45 mL/min/{1.73_m2} — ABNORMAL LOW (ref >59)
GFR calc non Af Amer: 39 mL/min/{1.73_m2} — ABNORMAL LOW (ref >59)
Globulin, Total: 2.1 g/dL (ref 1.5–4.5)
Glucose: 121 mg/dL — ABNORMAL HIGH (ref 65–99)
Potassium: 4.6 mmol/L (ref 3.5–5.2)
Sodium: 140 mmol/L (ref 134–144)
Total Protein: 6.4 g/dL (ref 6.0–8.5)

## 2020-05-20 LAB — CBC WITH DIFFERENTIAL/PLATELET
Basophils Absolute: 0.1 10*3/uL (ref 0.0–0.2)
Basos: 1 %
EOS (ABSOLUTE): 0.1 10*3/uL (ref 0.0–0.4)
Eos: 1 %
Hematocrit: 46.9 % (ref 37.5–51.0)
Hemoglobin: 16.2 g/dL (ref 13.0–17.7)
Immature Grans (Abs): 0.1 10*3/uL (ref 0.0–0.1)
Immature Granulocytes: 1 %
Lymphocytes Absolute: 2.1 10*3/uL (ref 0.7–3.1)
Lymphs: 24 %
MCH: 31.7 pg (ref 26.6–33.0)
MCHC: 34.5 g/dL (ref 31.5–35.7)
MCV: 92 fL (ref 79–97)
Monocytes Absolute: 0.7 10*3/uL (ref 0.1–0.9)
Monocytes: 8 %
Neutrophils Absolute: 5.8 10*3/uL (ref 1.4–7.0)
Neutrophils: 65 %
Platelets: 167 10*3/uL (ref 150–450)
RBC: 5.11 x10E6/uL (ref 4.14–5.80)
RDW: 12.2 % (ref 11.6–15.4)
WBC: 8.8 10*3/uL (ref 3.4–10.8)

## 2020-05-20 LAB — LIPID PANEL
Chol/HDL Ratio: 2 ratio (ref 0.0–5.0)
Cholesterol, Total: 91 mg/dL — ABNORMAL LOW (ref 100–199)
HDL: 45 mg/dL (ref >39)
LDL Chol Calc (NIH): 22 mg/dL (ref 0–99)
Triglycerides: 139 mg/dL (ref 0–149)
VLDL Cholesterol Cal: 24 mg/dL (ref 5–40)

## 2020-05-20 LAB — TSH: TSH: 2.33 u[IU]/mL (ref 0.450–4.500)

## 2020-06-01 DIAGNOSIS — J449 Chronic obstructive pulmonary disease, unspecified: Secondary | ICD-10-CM | POA: Diagnosis not present

## 2020-06-04 ENCOUNTER — Other Ambulatory Visit: Payer: Self-pay | Admitting: Adult Health

## 2020-06-04 DIAGNOSIS — R42 Dizziness and giddiness: Secondary | ICD-10-CM

## 2020-06-04 DIAGNOSIS — F418 Other specified anxiety disorders: Secondary | ICD-10-CM

## 2020-06-04 DIAGNOSIS — I69398 Other sequelae of cerebral infarction: Secondary | ICD-10-CM

## 2020-06-06 ENCOUNTER — Ambulatory Visit (INDEPENDENT_AMBULATORY_CARE_PROVIDER_SITE_OTHER): Payer: Medicare HMO

## 2020-06-06 DIAGNOSIS — I639 Cerebral infarction, unspecified: Secondary | ICD-10-CM | POA: Diagnosis not present

## 2020-06-07 LAB — CUP PACEART REMOTE DEVICE CHECK
Date Time Interrogation Session: 20220205040357
Implantable Pulse Generator Implant Date: 20210708

## 2020-06-10 NOTE — Progress Notes (Signed)
Carelink Summary Report / Loop Recorder 

## 2020-06-17 ENCOUNTER — Other Ambulatory Visit: Payer: Self-pay

## 2020-06-17 ENCOUNTER — Encounter: Payer: Self-pay | Admitting: Family Medicine

## 2020-06-17 ENCOUNTER — Ambulatory Visit (INDEPENDENT_AMBULATORY_CARE_PROVIDER_SITE_OTHER): Payer: Medicare HMO | Admitting: Family Medicine

## 2020-06-17 VITALS — BP 127/75 | HR 90 | Ht 67.5 in | Wt 207.0 lb

## 2020-06-17 DIAGNOSIS — M1712 Unilateral primary osteoarthritis, left knee: Secondary | ICD-10-CM | POA: Diagnosis not present

## 2020-06-17 MED ORDER — METHYLPREDNISOLONE ACETATE 80 MG/ML IJ SUSP
80.0000 mg | Freq: Once | INTRAMUSCULAR | Status: AC
  Administered 2020-06-17: 80 mg via INTRAMUSCULAR

## 2020-06-17 NOTE — Progress Notes (Signed)
   BP 127/75   Pulse 90   Ht 5' 7.5" (1.715 m)   Wt 207 lb (93.9 kg)   SpO2 99%   BMI 31.94 kg/m    Subjective:   Patient ID: Antonio Bowen, male    DOB: 01-31-1946, 75 y.o.   MRN: [id number]  HPI: Antonio Bowen is a 75 y.o. male presenting on 06/17/2020 for Knee Pain (Left knee)   HPI Left knee pain/osteoarthritis Patient has continued left knee pain/osteoarthritis and is been 4 months since he had an injection and would like to go ahead and do 1 again today.  He says is been bothering him more over the past few weeks.  He has been having chronic arthritis and injections still help some.  Relevant past medical, surgical, family and social history reviewed and updated as indicated. Interim medical history since our last visit reviewed. Allergies and medications reviewed and updated.  Review of Systems  Constitutional: Negative for chills and fever.  Respiratory: Negative for shortness of breath and wheezing.   Cardiovascular: Negative for chest pain and leg swelling.  Musculoskeletal: Positive for arthralgias. Negative for back pain, gait problem and joint swelling.  Skin: Negative for rash.  All other systems reviewed and are negative.   Per HPI unless specifically indicated above    Objective:   BP 127/75   Pulse 90   Ht 5' 7.5" (1.715 m)   Wt 207 lb (93.9 kg)   SpO2 99%   BMI 31.94 kg/m   Wt Readings from Last 3 Encounters:  06/17/20 207 lb (93.9 kg)  05/19/20 208 lb (94.3 kg)  04/05/20 201 lb (91.2 kg)    Physical Exam Vitals and nursing note reviewed.  Constitutional:      Appearance: Normal appearance.  Musculoskeletal:     Left knee: No swelling, deformity, effusion, erythema, bony tenderness or crepitus. Normal range of motion. Tenderness present over the medial joint line. Normal alignment.  Neurological:     Mental Status: He is alert.     Knee injection: Consent form signed. Risk factors of bleeding and infection discussed with patient and  patient is agreeable towards injection. Patient prepped with Betadine. Lateral approach towards injection used. Injected 80 mg of Depo-Medrol and 1 mL of 2% lidocaine. Patient tolerated procedure well and no side effects from noted. Minimal to no bleeding. Simple bandage applied after.   Assessment & Plan:   Problem List Items Addressed This Visit   None   Visit Diagnoses    Primary osteoarthritis of left knee    -  Primary   Relevant Medications   methylPREDNISolone acetate (DEPO-MEDROL) injection 80 mg       Follow up plan: Return in about 2 months (around 08/15/2020), or if symptoms worsen or fail to improve, for COPD and hypertension.  Counseling provided for all of the vaccine components No orders of the defined types were placed in this encounter.   Caryl Pina, MD [facility] 06/17/2020, 12:57 PM

## 2020-06-29 DIAGNOSIS — J449 Chronic obstructive pulmonary disease, unspecified: Secondary | ICD-10-CM | POA: Diagnosis not present

## 2020-07-05 ENCOUNTER — Ambulatory Visit (INDEPENDENT_AMBULATORY_CARE_PROVIDER_SITE_OTHER): Payer: Medicare HMO

## 2020-07-05 DIAGNOSIS — Z0000 Encounter for general adult medical examination without abnormal findings: Secondary | ICD-10-CM

## 2020-07-05 NOTE — Progress Notes (Signed)
MEDICARE ANNUAL WELLNESS VISIT  07/05/2020  Telephone Visit Disclaimer This Medicare AWV was conducted by telephone due to national recommendations for restrictions regarding the COVID-19 Pandemic (e.g. social distancing).  I verified, using two identifiers, that I am speaking with Antonio Bowen or their authorized healthcare agent. I discussed the limitations, risks, security, and privacy concerns of performing an evaluation and management service by telephone and the potential availability of an in-person appointment in the future. The patient expressed understanding and agreed to proceed.  Location of Patient: Home Location of Provider (nurse):  [facility]  Subjective:    Antonio Bowen is a 75 y.o. male patient of [name], [name], MD who had a Medicare Annual Wellness Visit today via telephone. Antonio Bowen is Retired and lives alone. He has two children. He reports that he is socially active and does interact with friends/family regularly. He is minimally physically active and enjoys playing golf but he is limited in his ability because of COPD and hip and back pain.  Patient Care Team: [name], [name], MD as PCP - General (Family Medicine) Ilean China, RN as Registered Nurse  Advanced Directives 07/05/2020 04/14/2020 11/04/2019 08/18/2019 08/02/2019 12/24/2018 08/13/2017  Does Patient Have a Medical Advance Directive? No No No No No No No  Would patient like information on creating a medical advance directive? No - Patient declined No - Patient declined No - Patient declined Yes (MAU/Ambulatory/Procedural Areas - Information given) No - Patient declined No - Patient declined Yes (MAU/Ambulatory/Procedural Areas - Information given)  Pre-existing out of facility DNR order (yellow form or pink MOST form) - - - - - - -    Hospital Utilization Over the Past 12 Months: # of hospitalizations or ER visits: 1 # of surgeries: 0  Review of Systems    Patient reports that his overall  health is worse compared to last year.  History obtained from chart review and the patient  Patient Reported Readings (BP, Pulse, CBG, Weight, etc) none  Pain Assessment Pain : 0-10 Pain Score: 4  Pain Type: Chronic pain Pain Location: Hip Pain Orientation: Right Pain Descriptors / Indicators: Constant,Aching,Throbbing Pain Onset: More than a month ago Pain Frequency: Constant Pain Relieving Factors: Injections in spine  Pain Relieving Factors: Injections in spine  Current Medications & Allergies (verified) Allergies as of 07/05/2020      Reactions   Nuts Nausea And Vomiting   sweating   Other Nausea And Vomiting   Per patient, cannot take any narcotic. Nausea and vomiting sweating   Vicodin [hydrocodone-acetaminophen]    tachycardia   Codeine    Aspirin Other (See Comments)   Upset stomach, bleeding   Penicillins Hives, Rash   Has patient had a PCN reaction causing immediate rash, facial/tongue/throat swelling, SOB or lightheadedness with hypotension:No Has patient had a PCN reaction causing severe rash involving mucus membranes or skin necrosis:No Has patient had a PCN reaction that required hospitalization:No Has patient had a PCN reaction occurring within the last 10 years:Yes If all of the above answers are "NO", then may proceed with Cephalosporin use.      Medication List       Accurate as of July 05, 2020  3:37 PM. If you have any questions, ask your nurse or doctor.        acetaminophen 500 MG tablet Commonly known as: TYLENOL Take 1,000 mg by mouth 2 (two) times daily as needed (for pain.).   albuterol 108 (90 Base) MCG/ACT inhaler  Commonly known as: Ventolin HFA Inhale 1-2 puffs into the lungs every 4 (four) hours as needed for wheezing or shortness of breath. INHALE TWO PUFFS INTO LUNGS EVERY 6 HOURS AS NEEDED FOR WHEEZING   budesonide-formoterol 160-4.5 MCG/ACT inhaler Commonly known as: Symbicort Inhale 2 puffs into the lungs in the morning and at  bedtime. Needs to be seen for further refills.   cetirizine 10 MG tablet Commonly known as: ZYRTEC Take 10 mg by mouth at bedtime.   clonazePAM 0.5 MG tablet Commonly known as: KLONOPIN TAKE 1 TABLET BY MOUTH TWICE DAILY AS NEEDED FOR DIZZINESS/ VERTIGO   clopidogrel 75 MG tablet Commonly known as: PLAVIX Take 1 tablet (75 mg total) by mouth daily.   diclofenac Sodium 1 % Gel Commonly known as: Voltaren Apply 2 g topically 4 (four) times daily.   fluticasone 50 MCG/ACT nasal spray Commonly known as: FLONASE Place 1 spray into both nostrils 2 (two) times daily as needed for allergies or rhinitis.   LEG CRAMP RELIEF PO Take 2 tablets by mouth at bedtime. [employer]'s   levothyroxine 125 MCG tablet Commonly known as: SYNTHROID Take 1 tablet (125 mcg total) by mouth daily before breakfast.   levothyroxine 25 MCG tablet Commonly known as: Euthyrox TAKE 1/2 (ONE-HALF) TABLET BY MOUTH ONCE DAILY AS DIRECTED   losartan-hydrochlorothiazide 50-12.5 MG tablet Commonly known as: HYZAAR Take 1 tablet by mouth daily.   magnesium oxide 400 MG tablet Commonly known as: MAG-OX Take 400 mg by mouth at bedtime.   meloxicam 15 MG tablet Commonly known as: MOBIC Take 1 tablet (15 mg total) by mouth daily.   omeprazole 40 MG capsule Commonly known as: PRILOSEC Take 1 capsule (40 mg total) by mouth in the morning and at bedtime.   pravastatin 40 MG tablet Commonly known as: PRAVACHOL Take 1 tablet (40 mg total) by mouth daily.   PRESCRIPTION MEDICATION Oxygen 3.0 liters at night, prn use in day   sucralfate 1 g tablet Commonly known as: CARAFATE TAKE 1 TABLET BY MOUTH 4 TIMES DAILY (WITH MEALS AND AT BEDTIME) What changed: See the new instructions.   SUPER B COMPLEX/C PO Take 1 capsule by mouth at bedtime.   traZODone 50 MG tablet Commonly known as: DESYREL Take 0.5-1 tablets (25-50 mg total) by mouth at bedtime as needed for sleep.       History (reviewed): Past Medical  History:  Diagnosis Date  . Allergic rhinitis   . Arthritis   . BOOP (bronchiolitis obliterans with organizing pneumonia) ([place])   . Bronchitis   . Colon polyps   . COPD (chronic obstructive pulmonary disease) ([place])   . Dyspnea    with exertion   . Dysrhythmia    skipped beats- followed by Dr Angelena Form   . GERD (gastroesophageal reflux disease)   . Hypertension   . Hypothyroidism   . Kidney stone   . Low back pain   . Oxygen dependent    uses 3L/[place] at nite occasional during day if needed   . Stroke [facility])    hx of 2 strokes which patient did not know he had had   . Thyroid disease   . Tobacco dependence    Past Surgical History:  Procedure Laterality Date  . BIOPSY  08/18/2019   Procedure: BIOPSY;  Surgeon: Irene Shipper, MD;  Location: [facility];  Service: Endoscopy;;  . broncoscopy     . carpel tunnal     left  . COLONOSCOPY     x  4  . DUPUYTREN CONTRACTURE RELEASE     Left  . ESOPHAGOGASTRODUODENOSCOPY (EGD) WITH PROPOFOL N/A 03/26/2016   Procedure: ESOPHAGOGASTRODUODENOSCOPY (EGD) WITH PROPOFOL;  Surgeon: Irene Shipper, MD;  Location: [facility];  Service: Endoscopy;  Laterality: N/A;  . ESOPHAGOGASTRODUODENOSCOPY (EGD) WITH PROPOFOL N/A 08/18/2019   Procedure: ESOPHAGOGASTRODUODENOSCOPY (EGD) WITH PROPOFOL;  Surgeon: Irene Shipper, MD;  Location: [facility];  Service: Endoscopy;  Laterality: N/A;  Possible dilation  . ESOPHAGOGASTRODUODENOSCOPY ENDOSCOPY     x 3  . FACIAL FRACTURE SURGERY    . LOOP RECORDER INSERTION N/A 11/05/2019   Procedure: LOOP RECORDER INSERTION;  Surgeon: Thompson Grayer, MD;  Location: [facility];  Service: Cardiovascular;  Laterality: N/A;  . OTHER SURGICAL HISTORY     facial surgery  . SAVORY DILATION N/A 08/18/2019   Procedure: SAVORY DILATION;  Surgeon: Irene Shipper, MD;  Location: [facility];  Service: Endoscopy;  Laterality: N/A;  . TOTAL HIP ARTHROPLASTY  01/2011   Right hip, [place]  . ULNAR NERVE TRANSPOSITION     left    Family History  Problem Relation Age of Onset  . Arthritis Father   . Stroke Father 13  . Prostate cancer Maternal Grandfather   . Stomach cancer Maternal Grandmother   . Cancer Paternal Grandmother   . Colon cancer Other        mat 1st cousin  . Cancer Mother   . Healthy Daughter   . Healthy Son   . Pancreatic cancer Neg Hx   . Esophageal cancer Neg Hx    Social History   Socioeconomic History  . Marital status: Married    Spouse name: Not on file  . Number of children: 2  . Years of education: 51  . Highest education level: 11th grade  Occupational History  . 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 Smoking status: Former Smoker    Packs/day: 1.00    Years: 50.00    Pack years: 50.00    Types: Cigarettes    Quit date: 07/30/2010    Years since quitting: 9.9  . Smokeless tobacco: Current User    Types: Snuff  Vaping Use  . Vaping Use: Never used  Substance and Sexual Activity  . Alcohol use: Yes    Comment: rare  . Drug use: No  . Sexual activity: Not on file  Other Topics Concern  . Not on file  Social History Narrative  . Not on file   Social Determinants of Health   Financial Resource Strain: Not on file  Food Insecurity: Not on file  Transportation Needs: Not on file  Physical Activity: Not on file  Stress: Not on file  Social Connections: Not on file    Activities of Daily Living In your present state of health, do you have any difficulty performing the following activities: 07/05/2020 11/04/2019  Hearing? N N  Vision? N N  Difficulty concentrating or making decisions? N N  Walking or climbing stairs? N N  Dressing or bathing? N N  Doing errands, shopping? N N  Preparing Food and eating ? N -  Using the Toilet? N -  In the past six months, have you accidently leaked urine? N -  Do you have problems with loss of bowel control? N -  Managing your Medications? N -  Managing  your Finances? N -  Housekeeping or managing your Housekeeping? N -  Some recent data might be hidden    Patient Education/ Literacy How often do you need to have someone help you when you read instructions, pamphlets, or other written materials from your doctor or pharmacy?: 1 - Never What is the last grade level you completed in school?: 11th grade  Exercise Current Exercise Habits: The patient does not participate in regular exercise at present, Exercise limited by: orthopedic condition(s)  Diet Patient reports consuming 3 meals a day and 1 snack(s) a day Patient reports that his primary diet is: Regular Patient reports that she does have regular access to food.   Depression Screen PHQ 2/9 Scores 07/05/2020 06/17/2020 05/19/2020 02/17/2020 11/16/2019 09/09/2019 12/24/2018  PHQ - 2 Score 0 0 0 0 0 0 0     Fall Risk Fall Risk  07/05/2020 06/17/2020 05/19/2020 02/17/2020 11/16/2019  Falls in the past year? 0 0 0 0 0  Comment - - - - -  Number falls in past yr: - - - - -  Injury with Fall? - - - - -  Comment - - - - -  Risk Factor Category  - - - - -  Risk for fall due to : - - - - -  Follow up Falls evaluation completed - - - -     Objective:  Antonio Bowen seemed alert and oriented and he participated appropriately during our telephone visit.  Blood Pressure Weight BMI  BP Readings from Last 3 Encounters:  06/17/20 127/75  05/19/20 130/73  04/05/20 119/74   Wt Readings from Last 3 Encounters:  06/17/20 207 lb (93.9 kg)  05/19/20 208 lb (94.3 kg)  04/05/20 201 lb (91.2 kg)   BMI Readings from Last 1 Encounters:  06/17/20 31.94 kg/m    *Unable to obtain current vital signs, weight, and BMI due to telephone visit type  Hearing/Vision  . Laddie did not seem to have difficulty with hearing/understanding during the telephone conversation . Reports that he has had a formal eye exam by an eye care professional within the past year . Reports that he has not had a formal  hearing evaluation within the past year *Unable to fully assess hearing and vision during telephone visit type  Cognitive Function: 6CIT Screen 07/05/2020 12/24/2018  What Year? 0 points 0 points  What month? 0 points 0 points  What time? 0 points 0 points  Count back from 20 0 points 0 points  Months in reverse 0 points 0 points  Repeat phrase 0 points 0 points  Total Score 0 0   (Normal:0-7, Significant for Dysfunction: >8)  Normal Cognitive Function Screening: Yes   Immunization & Health Maintenance Record Immunization History  Administered Date(s) Administered  . Influenza Split 01/24/2012  . Influenza, High Dose Seasonal PF 01/30/2018, 01/30/2018  . Influenza,inj,Quad PF,6+ Mos 03/09/2013, 01/14/2014, 02/21/2015, 02/09/2016, 03/07/2017  . Influenza-Unspecified 02/17/2020  . Moderna Sars-Covid-2 Vaccination 06/15/2019, 07/13/2019, 02/24/2020  . Pneumococcal Conjugate-13 01/14/2014  . Pneumococcal Polysaccharide-23 01/24/2012  . Td 07/03/2006  . Tdap 07/03/2006, 01/05/2011  . Zoster 01/05/2011    Health Maintenance  Topic Date Due  . COLONOSCOPY (Pts 45-38yrs Insurance coverage will need to be confirmed)  09/08/2020 (Originally 07/09/2017)  . TETANUS/TDAP  01/04/2021  . INFLUENZA VACCINE  Completed  . COVID-19 Vaccine  Completed  . Hepatitis C Screening  Completed  . PNA vac Low Risk Adult  Completed  . HPV VACCINES  Aged Out       Assessment  This is a routine wellness  examination for Antonio Bowen.  Health Maintenance: Due or Overdue There are no preventive care reminders to display for this patient.  Antonio Bowen does not need a referral for Community Assistance: Care Management:   no Social Work:    no Prescription Assistance:  no Nutrition/Diabetes Education:  no   Plan:  Personalized Goals Goals Addressed            This Visit's Progress   . Patient Stated       07/05/2020 AWV Goal: Fall Prevention  . Over the next year, patient will  decrease their risk for falls by: o Using assistive devices, such as a cane or walker, as needed o Identifying fall risks within their home and correcting them by: - Removing throw rugs - Adding handrails to stairs or ramps - Removing clutter and keeping a clear pathway throughout the home - Increasing light, especially at night - Adding shower handles/bars - Raising toilet seat o Identifying potential personal risk factors for falls: - Medication side effects - Incontinence/urgency - Vestibular dysfunction - Hearing loss - Musculoskeletal disorders - Neurological disorders - Orthostatic hypotension        Personalized Health Maintenance & Screening Recommendations  up to date  Lung Cancer Screening Recommended: no (Low Dose CT Chest recommended if Age 80-80 years, 30 pack-year currently smoking OR have quit w/in past 15 years) Hepatitis C Screening recommended: no HIV Screening recommended: no  Advanced Directives: Written information was not prepared per patient's request.  Referrals & Orders No orders of the defined types were placed in this encounter.   Follow-up Plan . Follow-up with [name], [name], MD as planned    I have personally reviewed and noted the following in the patient's chart:   . Medical and social history . Use of alcohol, tobacco or illicit drugs  . Current medications and supplements . Functional ability and status . Nutritional status . Physical activity . Advanced directives . List of other physicians . Hospitalizations, surgeries, and ER visits in previous 12 months . Vitals . Screenings to include cognitive, depression, and falls . Referrals and appointments  In addition, I have reviewed and discussed with Antonio Bowen certain preventive protocols, quality metrics, and best practice recommendations. A written personalized care plan for preventive services as well as general preventive health recommendations is available and can  be mailed to the patient at his request.      Felicity Coyer, LPN    10/28/624   Patient declines after visit summary

## 2020-07-11 ENCOUNTER — Ambulatory Visit (INDEPENDENT_AMBULATORY_CARE_PROVIDER_SITE_OTHER): Payer: Medicare HMO

## 2020-07-11 DIAGNOSIS — I639 Cerebral infarction, unspecified: Secondary | ICD-10-CM | POA: Diagnosis not present

## 2020-07-12 ENCOUNTER — Other Ambulatory Visit: Payer: Self-pay | Admitting: Adult Health

## 2020-07-12 DIAGNOSIS — I69398 Other sequelae of cerebral infarction: Secondary | ICD-10-CM

## 2020-07-12 DIAGNOSIS — R42 Dizziness and giddiness: Secondary | ICD-10-CM

## 2020-07-12 DIAGNOSIS — F418 Other specified anxiety disorders: Secondary | ICD-10-CM

## 2020-07-12 DIAGNOSIS — R4589 Other symptoms and signs involving emotional state: Secondary | ICD-10-CM

## 2020-07-12 NOTE — Telephone Encounter (Signed)
I called and LMVM for pt re: if had been to vestibular rehab?

## 2020-07-12 NOTE — Telephone Encounter (Signed)
He did participate in rehab with improvement of dizziness per their report.  Based on prior refill, looks like he is using clonazepam twice daily -highly encouraged using only as needed - okay to refill but please advise patient that next refill will only be for 1 tablet daily as needed in attempts to wean him off.  If he continues to experience anxiety, please advise him to follow-up with his PCP to discuss further treatment options as use of benzo is not recommended long-term

## 2020-07-13 LAB — CUP PACEART REMOTE DEVICE CHECK
Date Time Interrogation Session: 20220310040411
Implantable Pulse Generator Implant Date: 20210708

## 2020-07-13 NOTE — Telephone Encounter (Signed)
error 

## 2020-07-20 NOTE — Progress Notes (Signed)
Carelink Summary Report / Loop Recorder 

## 2020-07-30 DIAGNOSIS — J449 Chronic obstructive pulmonary disease, unspecified: Secondary | ICD-10-CM | POA: Diagnosis not present

## 2020-08-04 ENCOUNTER — Encounter: Payer: Self-pay | Admitting: Adult Health

## 2020-08-04 ENCOUNTER — Ambulatory Visit: Payer: Medicare HMO | Admitting: Adult Health

## 2020-08-04 VITALS — BP 119/73 | HR 71 | Ht 67.0 in | Wt 207.0 lb

## 2020-08-04 DIAGNOSIS — I639 Cerebral infarction, unspecified: Secondary | ICD-10-CM

## 2020-08-04 NOTE — Progress Notes (Signed)
[facility] [address] [name]. [place] [address] [phone]       STROKE FOLLOW UP NOTE  Mr. STEFANO TRULSON Date of Birth:  13-Sep-1945 Medical Record Number:  [id number]   Reason for Referral: stroke follow up    SUBJECTIVE:   CHIEF COMPLAINT:  Chief Complaint  Patient presents with  . Follow-up    TR with wife (rebecca) Pt is well, L hand grip immobility/numbness but over all doing better.    HPI:   Today, 08/04/2020, Mr. Oyama returns for 29-month stroke follow-up accompanied by his wife  Stable since prior visit without new stroke/TIA symptoms Reports residual left hand weakness with numbness/tingling - greatest concern is the pain with hitting golf balls but he has been slowly trying to swing a club.  No additional vertigo or dizziness issues - will occassionally get dizzy upon standing but quickly resolves Since completed therapies but continues to do exercises at home  Anxiety has been stable - he has continued to use clonazepam 0.5 mg twice daily although prescribed initially as needed for vertigo and severe anxiety associated with vertigo/dizziness. He believes clonazepam has been helping with his sleep and has experienced minimal anxiety  Compliant on Plavix and pravastatin - he does report issues with proximal lower extremity cramping with taking multiple supplements trying to ease symptoms but no relief - he does have hx of prior statin intolerance  Blood pressure today 119/73 - occasionally monitors at home and typically stable Loop recorder has not shown atrial fibrillation thus far  No further concerns at this time     History provided for reference purposes only Update 04/05/2020 JM: Mr. Tullis returns for 35-month stroke follow-up accompanied by his wife.  Complains of residual intermittent dizziness, vertigo and imbalance but greatly improving.  Also reports decreased left hand dexterity and numbness/tingling consistent with ulnar  nerve distribution.  Reports history of ulnar nerve decompression in 2016.  He continues to do arm exercises daily and is frustrated as he recently attempted to drive a golf ball but experienced throbbing pain in his left hand.  Intermittent use of clonazepam which has been beneficial in regards to anxiety interfering with sleep.  He has not trialed use of clonazepam with onset of vertigo.  Has been using OTC Dramamine with benefit of dizziness.  Denies new or worsening stroke/TIA symptoms.  Remains on Plavix and pravastatin for secondary stroke prevention without side effects.  Blood pressure today 119/74.  Loop recorder has not shown atrial fibrillation thus far.  No further concerns at this time.  Initial visit 12/30/2019 JM: Mr. Ragle is being seen for hospital follow-up accompanied by his wife Residual deficits continued left hand weakness with numbness, vertigo, imbalance and anxiety He has since completed therapy but continues to do exercises at home He is frustrated due to intermittent vertigo and imbalance limiting activity.  He does report improvement and is currently ambulating without assistive device previously ambulating with a cane at hospital discharge He has had difficulty sleeping at night due to increased anxiety and feeling as though he is not getting enough oxygen even when wearing 3L via [place].  He feels as though difficulty sleeping at night has prevented him from further improvement.  He has previously used clonazepam in the past with benefit.  Denies new or worsening stroke/TIA symptoms Completed 3 weeks DAPT and remains on Plavix alone without bleeding or bruising Continues on pravastatin 40 mg daily without myalgias Blood pressure today 119/73 Loop recorder has not  shown atrial fibrillation thus far No further concerns at this time  Stroke admission 11/03/2019 Mr. [name] is a 75 y.o. male with history of tobacco abuse, stroke, low back pain, hypertension, dysrhythmia,  arthritis who presented on 11/03/2019 with L arm weakness and numbness.  Stroke work-up revealed right corona radiata and right cerebellar infarcts in 2 different vascular territories, infarcts embolic secondary to unknown source.  Loop recorder placed to assess for atrial fibrillation as potential etiology.  Recommended DAPT for 3 weeks then Plavix alone. Hx of HTN elevated arrival stabilized with long-term BP goal normotensive range.  LDL 34 initiate atorvastatin 40 mg daily.  Other stroke risk factors include advanced age, former tobacco use, EtOH use, obesity, family history of stroke and personal history of stroke prevention.  Other active problems include COPD on home O2.  Evaluated by therapy and discharged home with recommendation of outpatient PT.  Stroke:   R corona radiata and R cerebellar infarcts in 2 different vascular territories, infarcts embolic secondary to unknown source  CT head age indeterminate R cerebellar infarct new from 02/18/2015. New chronic L basal ganglia lacune. Old small R caudate nucleus and L cerebellar infarcts. Small vessel disease. Atrophy. Sinus dz.  MRI  Small R corona radiata infarct. Few acute/subacute R cerebellar infarcts. Small vessel disease.   MRA  Unremarkable   Carotid Doppler  B ICA 1-39% stenosis, VAs antegrade   2D Echo normal ejection fraction 60 to 65%.  No wall motion abnormalities.    Loop recorder inserted 11/05/2019  LDL 35  HgbA1c 5.7  VTE prophylaxis - Heparin 5000 units sq tid   No antithrombotic prior to admission (no aspirin as on Meloxicam for arthritis), now on clopidogrel 75 mg daily. Listed allergy to aspirin of "upset stomach, bleeding". Dr. Leonie Man recommends DAPT x 3 weeks then plavix alone if pt can tolerate.     Therapy recommendations:  OP PT, no SLP  Disposition:  Return home     ROS:   14 system review of systems performed and negative with exception of see HPI  PMH:  Past Medical History:  Diagnosis Date  .  Allergic rhinitis   . Arthritis   . BOOP (bronchiolitis obliterans with organizing pneumonia) ([place])   . Bronchitis   . Colon polyps   . COPD (chronic obstructive pulmonary disease) ([place])   . Dyspnea    with exertion   . Dysrhythmia    skipped beats- followed by Dr Angelena Form   . GERD (gastroesophageal reflux disease)   . Hypertension   . Hypothyroidism   . Kidney stone   . Low back pain   . Oxygen dependent    uses 3L/[place] at nite occasional during day if needed   . Stroke [facility])    hx of 2 strokes which patient did not know he had had   . Thyroid disease   . Tobacco dependence     PSH:  Past Surgical History:  Procedure Laterality Date  . BIOPSY  08/18/2019   Procedure: BIOPSY;  Surgeon: Irene Shipper, MD;  Location: [facility];  Service: Endoscopy;;  . broncoscopy     . carpel tunnal     left  . COLONOSCOPY     x 4  . DUPUYTREN CONTRACTURE RELEASE     Left  . ESOPHAGOGASTRODUODENOSCOPY (EGD) WITH PROPOFOL N/A 03/26/2016   Procedure: ESOPHAGOGASTRODUODENOSCOPY (EGD) WITH PROPOFOL;  Surgeon: Irene Shipper, MD;  Location: [facility];  Service: Endoscopy;  Laterality: N/A;  . ESOPHAGOGASTRODUODENOSCOPY (  EGD) WITH PROPOFOL N/A 08/18/2019   Procedure: ESOPHAGOGASTRODUODENOSCOPY (EGD) WITH PROPOFOL;  Surgeon: Irene Shipper, MD;  Location: [facility];  Service: Endoscopy;  Laterality: N/A;  Possible dilation  . ESOPHAGOGASTRODUODENOSCOPY ENDOSCOPY     x 3  . FACIAL FRACTURE SURGERY    . LOOP RECORDER INSERTION N/A 11/05/2019   Procedure: LOOP RECORDER INSERTION;  Surgeon: Thompson Grayer, MD;  Location: [facility];  Service: Cardiovascular;  Laterality: N/A;  . OTHER SURGICAL HISTORY     facial surgery  . SAVORY DILATION N/A 08/18/2019   Procedure: SAVORY DILATION;  Surgeon: Irene Shipper, MD;  Location: [facility];  Service: Endoscopy;  Laterality: N/A;  . TOTAL HIP ARTHROPLASTY  01/2011   Right hip, [place]  . ULNAR NERVE TRANSPOSITION     left    Social History:   Social History   Socioeconomic History  . Marital status: Married    Spouse name: Not on file  . Number of children: 2  . Years of education: 2  . Highest education level: 11th grade  Occupational History  . 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 Smoking status: Former Smoker    Packs/day: 1.00    Years: 50.00    Pack years: 50.00    Types: Cigarettes    Quit date: 07/30/2010    Years since quitting: 10.0  . Smokeless tobacco: Current User    Types: Snuff  Vaping Use  . Vaping Use: Never used  Substance and Sexual Activity  . Alcohol use: Yes    Comment: rare  . Drug use: No  . Sexual activity: Not on file  Other Topics Concern  . Not on file  Social History Narrative  . Not on file   Social Determinants of Health   Financial Resource Strain: Not on file  Food Insecurity: Not on file  Transportation Needs: Not on file  Physical Activity: Not on file  Stress: Not on file  Social Connections: Not on file  Intimate Partner Violence: Not on file    Family History:  Family History  Problem Relation Age of Onset  . Arthritis Father   . Stroke Father 43  . Prostate cancer Maternal Grandfather   . Stomach cancer Maternal Grandmother   . Cancer Paternal Grandmother   . Colon cancer Other        mat 1st cousin  . Cancer Mother   . Healthy Daughter   . Healthy Son   . Pancreatic cancer Neg Hx   . Esophageal cancer Neg Hx     Medications:   Current Outpatient Medications on File Prior to Visit  Medication Sig Dispense Refill  . acetaminophen (TYLENOL) 500 MG tablet Take 1,000 mg by mouth 2 (two) times daily as needed (for pain.).    Marland Kitchen albuterol (VENTOLIN HFA) 108 (90 Base) MCG/ACT inhaler Inhale 1-2 puffs into the lungs every 4 (four) hours as needed for wheezing or shortness of breath. INHALE TWO PUFFS INTO LUNGS EVERY 6 HOURS AS NEEDED FOR WHEEZING 18 g 3  . budesonide-formoterol  (SYMBICORT) 160-4.5 MCG/ACT inhaler Inhale 2 puffs into the lungs in the morning and at bedtime. Needs to be seen for further refills. 3 each 3  . cetirizine (ZYRTEC) 10 MG tablet Take 10 mg by mouth at bedtime.     . clonazePAM (KLONOPIN) 0.5 MG tablet TAKE 1 TABLET BY MOUTH TWICE DAILY AS NEEDED FOR DIZZINESS  60 tablet 0  . clopidogrel (PLAVIX) 75 MG tablet Take 1 tablet (75 mg total) by mouth daily. 90 tablet 3  . diclofenac Sodium (VOLTAREN) 1 % GEL Apply 2 g topically 4 (four) times daily. 350 g 3  . fluticasone (FLONASE) 50 MCG/ACT nasal spray Place 1 spray into both nostrils 2 (two) times daily as needed for allergies or rhinitis. 48 g 3  . Homeopathic Products (LEG CRAMP RELIEF PO) Take 2 tablets by mouth at bedtime. Hyland's    . levothyroxine (EUTHYROX) 25 MCG tablet TAKE 1/2 (ONE-HALF) TABLET BY MOUTH ONCE DAILY AS DIRECTED 45 tablet 2  . levothyroxine (SYNTHROID) 125 MCG tablet Take 1 tablet (125 mcg total) by mouth daily before breakfast. 90 tablet 3  . losartan-hydrochlorothiazide (HYZAAR) 50-12.5 MG tablet Take 1 tablet by mouth daily. 90 tablet 3  . magnesium oxide (MAG-OX) 400 MG tablet Take 400 mg by mouth at bedtime.     . meloxicam (MOBIC) 15 MG tablet Take 1 tablet (15 mg total) by mouth daily. 90 tablet 3  . omeprazole (PRILOSEC) 40 MG capsule Take 1 capsule (40 mg total) by mouth in the morning and at bedtime. 180 capsule 3  . pravastatin (PRAVACHOL) 40 MG tablet Take 1 tablet (40 mg total) by mouth daily. 90 tablet 3  . PRESCRIPTION MEDICATION Oxygen 3.0 liters at night, prn use in day    . sucralfate (CARAFATE) 1 g tablet TAKE 1 TABLET BY MOUTH 4 TIMES DAILY (WITH MEALS AND AT BEDTIME) (Patient taking differently: Take 1 g by mouth as needed (ulcers).) 120 tablet 2  . SUPER B COMPLEX/C PO Take 1 capsule by mouth at bedtime.    . traZODone (DESYREL) 50 MG tablet Take 0.5-1 tablets (25-50 mg total) by mouth at bedtime as needed for sleep. 90 tablet 3   No current  facility-administered medications on file prior to visit.    Allergies:   Allergies  Allergen Reactions  . Nuts Nausea And Vomiting    sweating  . Other Nausea And Vomiting    Per patient, cannot take any narcotic. Nausea and vomiting sweating  . Vicodin [Hydrocodone-Acetaminophen]     tachycardia  . Codeine   . Aspirin Other (See Comments)    Upset stomach, bleeding  . Penicillins Hives and Rash    Has patient had a PCN reaction causing immediate rash, facial/tongue/throat swelling, SOB or lightheadedness with hypotension:No Has patient had a PCN reaction causing severe rash involving mucus membranes or skin necrosis:No Has patient had a PCN reaction that required hospitalization:No Has patient had a PCN reaction occurring within the last 10 years:Yes If all of the above answers are "NO", then may proceed with Cephalosporin use.       OBJECTIVE:  Physical Exam  Vitals:   08/04/20 1435  BP: 119/73  Pulse: 71  Weight: 207 lb (93.9 kg)  Height: 5\' 7"  (1.702 m)   Body mass index is 32.42 kg/m. No exam data present  General: well developed, well nourished, pleasant elderly Caucasian male, seated, in no evident distress Head: head normocephalic and atraumatic.   Neck: supple with no carotid or supraclavicular bruits Cardiovascular: regular rate and rhythm, no murmurs Musculoskeletal: no deformity Skin:  no rash/petichiae Vascular:  Normal pulses all extremities   Neurologic Exam Mental Status: Awake and fully alert.   Fluent speech and language.  Oriented to place and time. Recent and remote memory intact. Attention span, concentration and fund of knowledge appropriate. Mood and affect appropriate.  Cranial Nerves:  Pupils equal, briskly reactive to light. Extraocular movements full without nystagmus. Visual fields full to confrontation. Hearing intact. Facial sensation intact. Face, tongue, palate moves normally and symmetrically.  Motor: Normal bulk and tone. Normal  strength in all tested extremity muscles except slightly decreased left hand dexterity Sensory.:  Mild sensory impairment along the L ulnar nerve distribution from the elbow down to pinky and ring finger otherwise intact Coordination: Rapid alternating movements normal in all extremities except decreased left hand. Finger-to-nose shows mild left arm ataxia and heel-to-shin performed accurately bilaterally. Gait and Station: Arises from chair without difficulty. Stance is normal. Gait demonstrates  broad-based gait with favoring of right hip (hx of R hip replacement) and mild unsteadiness.  Reflexes: 1+ and symmetric. Toes downgoing.        ASSESSMENT: Antonio Bowen is a 75 y.o. year old male presented with left arm weakness and numbness on 11/03/2019 with stroke work-up revealing right corona radiata and R cerebellar infarcts in 2 different vascular territories, infarcts embolic secondary to unknown source. Vascular risk factors include HTN, HLD, advanced age, former tobacco use, EtOH use, obesity, history of stroke on imaging and family history of stroke.      PLAN:  1. R CR and cerebellar infarcts, cryptogenic:  a. Residual deficit: Decreased left hand dexterity with numbness/tingling i. Discussed continued exercises as recommended during therapy sessions.  ii. Anxiety and vertigo poststroke, resolved: Educated on initial prescribing of clonazepam to be taken during vertigo episodes and increased anxiety associated with vertigo - he has been taking routinely twice daily and highly encouraged slowly decreasing dosage with eventual discontinuing.  Discussed potential risks of prolonged use of benzodiazepines.  Advised to follow-up with PCP if he continues have difficulty with anxiety b. Continue clopidogrel 75 mg daily  and pravastatin for secondary stroke prevention. c. Loop recorder has not shown atrial fibrillation thus far -continuously monitored by cardiology d. Discussed secondary  stroke prevention measures and importance of close PCP follow up for aggressive stroke risk factor management  2. HTN:  a. BP goal <130/90.  Stable on losartan-hydrochlorothiazide per PCP 3. HLD:  a. LDL goal <70. On pravastatin per PCP - he does report proximal lower extremity cramping with use of multiple OTC supplements in hopes of stopping symptoms -recommend holding pravastatin for the next 4-5 days to see if symptoms improve. If so, consider use of different statin but if not, advised to resume and follow-up with PCP for further discussion    Follow up in 6 months or call earlier if needed   CC:  [facility] provider: Dr. Leonie Man [name], [name], MD    I spent 30 minutes of face-to-face and non-face-to-face time with patient and wife.  This included previsit chart review, lab review, study review, order entry, electronic health record documentation, patient education regarding prior stroke and residual deficits, monitoring of loop recorder and importance of managing stroke risk factors, anxiety and use of benzodiazepines and answered all other questions to patient and wife satisfaction   Frann Rider, AGNP-BC  [facility] [address] [place], [place] [address]  Phone [phone] Fax [phone] Note: This document was prepared with digital dictation and possible smart phrase technology. Any transcriptional errors that result from this process are unintentional.

## 2020-08-04 NOTE — Patient Instructions (Signed)
Slowly try to decrease use of clonazapam to using as needed with attemps to trying to completely stop - recommend further discussing with PCP regarding other treatment options are are safer to take long term  Continue clopidogrel 75 mg daily  and pravastatin  for secondary stroke prevention  Try CoQ10 200-300mg  daily to see if this helps with cramping  Try to stop pravastatin for a few days to see if cramping resolves. If so, will need to consider changing to a different kind. If after 4-5 days you see no improvement, recommend restarting pravastatin   Continue to follow up with PCP regarding cholesterol and blood pressure management  Maintain strict control of hypertension with blood pressure goal below 130/90 and cholesterol with LDL cholesterol (bad cholesterol) goal below 70 mg/dL.       Followup in the future with me in 6 months or call earlier if needed       Thank you for coming to see [place] at [facility]. I hope we have been able to provide you high quality care today.  You may receive a patient satisfaction survey over the next few weeks. We would appreciate your feedback and comments so that we may continue to improve ourselves and the health of our patients.

## 2020-08-07 NOTE — Progress Notes (Signed)
I agree with the above plan 

## 2020-08-11 ENCOUNTER — Ambulatory Visit (INDEPENDENT_AMBULATORY_CARE_PROVIDER_SITE_OTHER): Payer: Medicare HMO | Admitting: Family Medicine

## 2020-08-11 ENCOUNTER — Encounter: Payer: Self-pay | Admitting: Family Medicine

## 2020-08-11 ENCOUNTER — Other Ambulatory Visit: Payer: Self-pay

## 2020-08-11 VITALS — BP 132/88 | HR 82 | Ht 67.0 in | Wt 204.0 lb

## 2020-08-11 DIAGNOSIS — E039 Hypothyroidism, unspecified: Secondary | ICD-10-CM | POA: Diagnosis not present

## 2020-08-11 DIAGNOSIS — J441 Chronic obstructive pulmonary disease with (acute) exacerbation: Secondary | ICD-10-CM | POA: Diagnosis not present

## 2020-08-11 DIAGNOSIS — I10 Essential (primary) hypertension: Secondary | ICD-10-CM

## 2020-08-11 DIAGNOSIS — E785 Hyperlipidemia, unspecified: Secondary | ICD-10-CM

## 2020-08-11 DIAGNOSIS — N1832 Chronic kidney disease, stage 3b: Secondary | ICD-10-CM

## 2020-08-11 MED ORDER — ROSUVASTATIN CALCIUM 10 MG PO TABS: 10.0000 mg | ORAL_TABLET | Freq: Every day | ORAL | 3 refills | Status: DC

## 2020-08-11 NOTE — Progress Notes (Signed)
BP 132/88   Pulse 82   Ht _0  (1.702 m)   Wt 204 lb (92.5 kg)   SpO2 94%   BMI 31.95 kg/m    Subjective:   Patient ID: Antonio Bowen, male    DOB: January 01, 1946, 75 y.o.   MRN: [id number]  HPI: Antonio Bowen is a 75 y.o. male presenting on 08/11/2020 for Medical Management of Chronic Issues and Hypothyroidism   HPI COPD Patient is coming in for COPD recheck today.  He is currently on Symbicort, gave samples for breztri.  He has a mild chronic cough but denies any major coughing spells or wheezing spells.  He has 0nighttime symptoms per week and 0daytime symptoms per week currently.   Hypothyroidism recheck Patient is coming in for thyroid recheck today as well. They deny any issues with hair changes or heat or cold problems or diarrhea or constipation. They deny any chest pain or palpitations. They are currently on levothyroxine 151mcrograms  Hypertension and CKD 3 Patient is currently on losartan hydrochlorothiazide, and their blood pressure today is 132/88. Patient denies any lightheadedness or dizziness. Patient denies headaches, blurred vision, chest pains, shortness of breath, or weakness. Denies any side effects from medication and is content with current medication.   Hyperlipidemia Patient is coming in for recheck of his hyperlipidemia. The patient is currently taking pravastatin but stopped it a week ago.  Patient says he is gets muscle cramps from the pravastatin so he stopped it.  He says most cramps are gone now that he stopped it which was just a week ago.. They deny any focal numbness or weakness or chest pain.   Relevant past medical, surgical, family and social history reviewed and updated as indicated. Interim medical history since our last visit reviewed. Allergies and medications reviewed and updated.  Review of Systems  Constitutional: Negative for chills and fever.  Eyes: Negative for discharge and visual disturbance.  Respiratory: Negative for shortness  of breath and wheezing.   Cardiovascular: Negative for chest pain and leg swelling.  Musculoskeletal: Positive for myalgias. Negative for back pain and gait problem.  Skin: Negative for rash.  Neurological: Negative for dizziness and weakness.  All other systems reviewed and are negative.   Per HPI unless specifically indicated above   Allergies as of 08/11/2020      Reactions   Nuts Nausea And Vomiting   sweating   Other Nausea And Vomiting   Per patient, cannot take any narcotic. Nausea and vomiting sweating   Vicodin [hydrocodone-acetaminophen]    tachycardia   Codeine    Aspirin Other (See Comments)   Upset stomach, bleeding   Penicillins Hives, Rash   Has patient had a PCN reaction causing immediate rash, facial/tongue/throat swelling, SOB or lightheadedness with hypotension:No Has patient had a PCN reaction causing severe rash involving mucus membranes or skin necrosis:No Has patient had a PCN reaction that required hospitalization:No Has patient had a PCN reaction occurring within the last 10 years:Yes If all of the above answers are "NO", then may proceed with Cephalosporin use.      Medication List       Accurate as of August 11, 2020  3:11 PM. If you have any questions, ask your nurse or doctor.        STOP taking these medications   pravastatin 40 MG tablet Commonly known as: PRAVACHOL Stopped by: JFransisca KaufmannDettinger, MD     TAKE these medications   acetaminophen 500 MG tablet Commonly known  as: TYLENOL Take 1,000 mg by mouth 2 (two) times daily as needed (for pain.).   albuterol 108 (90 Base) MCG/ACT inhaler Commonly known as: Ventolin HFA Inhale 1-2 puffs into the lungs every 4 (four) hours as needed for wheezing or shortness of breath. INHALE TWO PUFFS INTO LUNGS EVERY 6 HOURS AS NEEDED FOR WHEEZING   budesonide-formoterol 160-4.5 MCG/ACT inhaler Commonly known as: Symbicort Inhale 2 puffs into the lungs in the morning and at bedtime. Needs to be seen  for further refills.   cetirizine 10 MG tablet Commonly known as: ZYRTEC Take 10 mg by mouth at bedtime.   clonazePAM 0.5 MG tablet Commonly known as: KLONOPIN TAKE 1 TABLET BY MOUTH TWICE DAILY AS NEEDED FOR DIZZINESS   clopidogrel 75 MG tablet Commonly known as: PLAVIX Take 1 tablet (75 mg total) by mouth daily.   diclofenac Sodium 1 % Gel Commonly known as: Voltaren Apply 2 g topically 4 (four) times daily.   fluticasone 50 MCG/ACT nasal spray Commonly known as: FLONASE Place 1 spray into both nostrils 2 (two) times daily as needed for allergies or rhinitis.   LEG CRAMP RELIEF PO Take 2 tablets by mouth at bedtime. [employer]   levothyroxine 125 MCG tablet Commonly known as: SYNTHROID Take 1 tablet (125 mcg total) by mouth daily before breakfast.   levothyroxine 25 MCG tablet Commonly known as: Euthyrox TAKE 1/2 (ONE-HALF) TABLET BY MOUTH ONCE DAILY AS DIRECTED   losartan-hydrochlorothiazide 50-12.5 MG tablet Commonly known as: HYZAAR Take 1 tablet by mouth daily.   magnesium oxide 400 MG tablet Commonly known as: MAG-OX Take 400 mg by mouth at bedtime.   meloxicam 15 MG tablet Commonly known as: MOBIC Take 1 tablet (15 mg total) by mouth daily.   omeprazole 40 MG capsule Commonly known as: PRILOSEC Take 1 capsule (40 mg total) by mouth in the morning and at bedtime.   PRESCRIPTION MEDICATION Oxygen 3.0 liters at night, prn use in day   rosuvastatin 10 MG tablet Commonly known as: Crestor Take 1 tablet (10 mg total) by mouth daily. Started by: [name] Abrina Petz, MD   sucralfate 1 g tablet Commonly known as: CARAFATE TAKE 1 TABLET BY MOUTH 4 TIMES DAILY (WITH MEALS AND AT BEDTIME) What changed: See the new instructions.   SUPER B COMPLEX/C PO Take 1 capsule by mouth at bedtime.   traZODone 50 MG tablet Commonly known as: DESYREL Take 0.5-1 tablets (25-50 mg total) by mouth at bedtime as needed for sleep.        Objective:   BP 132/88    Pulse 82   Ht _0  (1.702 m)   Wt 204 lb (92.5 kg)   SpO2 94%   BMI 31.95 kg/m   Wt Readings from Last 3 Encounters:  08/11/20 204 lb (92.5 kg)  08/04/20 207 lb (93.9 kg)  06/17/20 207 lb (93.9 kg)    Physical Exam Vitals and nursing note reviewed.  Constitutional:      General: He is not in acute distress.    Appearance: He is well-developed. He is not diaphoretic.  Eyes:     General: No scleral icterus.    Conjunctiva/sclera: Conjunctivae normal.  Neck:     Thyroid: No thyromegaly.  Cardiovascular:     Rate and Rhythm: Normal rate and regular rhythm.     Heart sounds: Normal heart sounds. No murmur heard.   Pulmonary:     Effort: Pulmonary effort is normal. No respiratory distress.     Breath sounds:  Normal breath sounds. No wheezing.  Musculoskeletal:        General: Normal range of motion.     Cervical back: Neck supple.  Lymphadenopathy:     Cervical: No cervical adenopathy.  Skin:    General: Skin is warm and dry.     Findings: No rash.  Neurological:     Mental Status: He is alert and oriented to person, place, and time.     Coordination: Coordination normal.  Psychiatric:        Behavior: Behavior normal.       Assessment & Plan:   Problem List Items Addressed This Visit      Cardiovascular and Mediastinum   Hypertension   Relevant Medications   rosuvastatin (CRESTOR) 10 MG tablet   Other Relevant Orders   BMP8+EGFR     Respiratory   Obstructive chronic bronchitis with exacerbation Gold B Copd     Endocrine   Hypothyroidism - Primary (Chronic)   Relevant Orders   Thyroid Panel With TSH     Genitourinary   Chronic kidney disease (CKD), stage III (moderate) (HCC)     Other   Hyperlipidemia LDL goal <130   Relevant Medications   rosuvastatin (CRESTOR) 10 MG tablet      Will switch from pravastatin to rosuvastatin to see if it does better with his muscle cramps, if he has to go to every other day.  Gave samples for inhalers  today. Follow up plan: Return in about 6 months (around 02/10/2021), or if symptoms worsen or fail to improve, for COPD and hypothyroidism and CKD.  Counseling provided for all of the vaccine components Orders Placed This Encounter  Procedures  . BMP8+EGFR  . Thyroid Panel With TSH    Caryl Pina, MD [facility] 08/11/2020, 3:11 PM

## 2020-08-12 LAB — BMP8+EGFR
BUN/Creatinine Ratio: 11 (ref 10–24)
BUN: 20 mg/dL (ref 8–27)
CO2: 21 mmol/L (ref 20–29)
Calcium: 9.4 mg/dL (ref 8.6–10.2)
Chloride: 102 mmol/L (ref 96–106)
Creatinine, Ser: 1.77 mg/dL — ABNORMAL HIGH (ref 0.76–1.27)
Glucose: 95 mg/dL (ref 65–99)
Potassium: 4.8 mmol/L (ref 3.5–5.2)
Sodium: 141 mmol/L (ref 134–144)
eGFR: 40 mL/min/{1.73_m2} — ABNORMAL LOW (ref >59)

## 2020-08-12 LAB — THYROID PANEL WITH TSH
Free Thyroxine Index: 2.9 (ref 1.2–4.9)
T3 Uptake Ratio: 29 % (ref 24–39)
T4, Total: 9.9 ug/dL (ref 4.5–12.0)
TSH: 3.69 u[IU]/mL (ref 0.450–4.500)

## 2020-08-15 ENCOUNTER — Ambulatory Visit (INDEPENDENT_AMBULATORY_CARE_PROVIDER_SITE_OTHER): Payer: Medicare HMO

## 2020-08-15 DIAGNOSIS — I639 Cerebral infarction, unspecified: Secondary | ICD-10-CM | POA: Diagnosis not present

## 2020-08-16 LAB — CUP PACEART REMOTE DEVICE CHECK
Date Time Interrogation Session: 20220412040406
Implantable Pulse Generator Implant Date: 20210708

## 2020-08-24 DIAGNOSIS — M5416 Radiculopathy, lumbar region: Secondary | ICD-10-CM | POA: Insufficient documentation

## 2020-08-29 ENCOUNTER — Other Ambulatory Visit: Payer: Self-pay | Admitting: Adult Health

## 2020-08-29 DIAGNOSIS — J449 Chronic obstructive pulmonary disease, unspecified: Secondary | ICD-10-CM | POA: Diagnosis not present

## 2020-08-29 DIAGNOSIS — R42 Dizziness and giddiness: Secondary | ICD-10-CM

## 2020-08-29 DIAGNOSIS — I69398 Other sequelae of cerebral infarction: Secondary | ICD-10-CM

## 2020-08-29 DIAGNOSIS — F418 Other specified anxiety disorders: Secondary | ICD-10-CM

## 2020-08-30 NOTE — Telephone Encounter (Addendum)
Please advise patient that I will send in refill but to take only half tab at night as needed and hopeful discontinuation after this month. No additional refills will be provided from our office - will have to speak further with PCP for any additional refills if he agrees.   I informed pt of this message he verbalized understanding and appreciated call back.

## 2020-08-30 NOTE — Telephone Encounter (Signed)
Called pt.  He last saw pcp 05/2020, will see again in 6 months.  He states he is getting better. (has decreased using the clonazepam.  I did read him your note about seeing pcp for other options.  I told him will callback if any other message from [place].  He verbalized understanding.

## 2020-08-31 NOTE — Progress Notes (Signed)
Carelink Summary Report / Loop Recorder 

## 2020-09-02 DIAGNOSIS — M5416 Radiculopathy, lumbar region: Secondary | ICD-10-CM | POA: Diagnosis not present

## 2020-09-02 DIAGNOSIS — M5116 Intervertebral disc disorders with radiculopathy, lumbar region: Secondary | ICD-10-CM | POA: Diagnosis not present

## 2020-09-19 ENCOUNTER — Ambulatory Visit (INDEPENDENT_AMBULATORY_CARE_PROVIDER_SITE_OTHER): Payer: Medicare HMO

## 2020-09-19 DIAGNOSIS — Z8673 Personal history of transient ischemic attack (TIA), and cerebral infarction without residual deficits: Secondary | ICD-10-CM | POA: Diagnosis not present

## 2020-09-20 DIAGNOSIS — M5416 Radiculopathy, lumbar region: Secondary | ICD-10-CM | POA: Diagnosis not present

## 2020-09-20 DIAGNOSIS — M5136 Other intervertebral disc degeneration, lumbar region: Secondary | ICD-10-CM | POA: Diagnosis not present

## 2020-09-24 LAB — CUP PACEART REMOTE DEVICE CHECK
Date Time Interrogation Session: 20220515040824
Implantable Pulse Generator Implant Date: 20210708

## 2020-09-29 DIAGNOSIS — J449 Chronic obstructive pulmonary disease, unspecified: Secondary | ICD-10-CM | POA: Diagnosis not present

## 2020-10-11 NOTE — Progress Notes (Signed)
Carelink Summary Report / Loop Recorder 

## 2020-10-14 LAB — CUP PACEART REMOTE DEVICE CHECK
Date Time Interrogation Session: 20220617040541
Implantable Pulse Generator Implant Date: 20210708

## 2020-10-17 ENCOUNTER — Ambulatory Visit: Payer: Medicare HMO | Admitting: Family Medicine

## 2020-10-17 ENCOUNTER — Ambulatory Visit (INDEPENDENT_AMBULATORY_CARE_PROVIDER_SITE_OTHER): Payer: Medicare HMO

## 2020-10-17 DIAGNOSIS — M4150 Other secondary scoliosis, site unspecified: Secondary | ICD-10-CM | POA: Insufficient documentation

## 2020-10-17 DIAGNOSIS — Z8673 Personal history of transient ischemic attack (TIA), and cerebral infarction without residual deficits: Secondary | ICD-10-CM

## 2020-10-17 DIAGNOSIS — M545 Low back pain, unspecified: Secondary | ICD-10-CM | POA: Diagnosis not present

## 2020-10-20 ENCOUNTER — Ambulatory Visit (INDEPENDENT_AMBULATORY_CARE_PROVIDER_SITE_OTHER): Payer: Medicare HMO | Admitting: Family Medicine

## 2020-10-20 ENCOUNTER — Other Ambulatory Visit: Payer: Self-pay

## 2020-10-20 ENCOUNTER — Encounter: Payer: Self-pay | Admitting: Family Medicine

## 2020-10-20 VITALS — BP 135/79 | HR 80 | Ht 67.0 in | Wt 206.0 lb

## 2020-10-20 DIAGNOSIS — M1712 Unilateral primary osteoarthritis, left knee: Secondary | ICD-10-CM

## 2020-10-20 MED ORDER — METHYLPREDNISOLONE ACETATE 40 MG/ML IJ SUSP
80.0000 mg | Freq: Once | INTRAMUSCULAR | Status: AC
  Administered 2020-10-20: 80 mg via INTRAMUSCULAR

## 2020-10-20 NOTE — Progress Notes (Signed)
BP 135/79   Pulse 80   Ht 5\' 7"  (1.702 m)   Wt 206 lb (93.4 kg)   SpO2 98%   BMI 32.26 kg/m    Subjective:   Patient ID: Antonio Bowen, male    DOB: [birth year], 75 y.o.   MRN: [id number]  HPI: Antonio Bowen is a 75 y.o. male presenting on 10/20/2020 for Knee Pain (Requesting injection today)   HPI Patient is coming in complaining of left knee pain and arthritis that is been bothering him more.  He has had injections previously and done well with them and wants to try again.  Relevant past medical, surgical, family and social history reviewed and updated as indicated. Interim medical history since our last visit reviewed. Allergies and medications reviewed and updated.  Review of Systems  Constitutional:  Negative for chills and fever.  Respiratory:  Negative for shortness of breath and wheezing.   Cardiovascular:  Negative for chest pain and leg swelling.  Musculoskeletal:  Positive for arthralgias and joint swelling. Negative for back pain and gait problem.  Skin:  Negative for rash.  All other systems reviewed and are negative.  Per HPI unless specifically indicated above   Allergies as of 10/20/2020       Reactions   Nuts Nausea And Vomiting   sweating   Other Nausea And Vomiting   Per patient, cannot take any narcotic. Nausea and vomiting sweating   Vicodin [hydrocodone-acetaminophen]    tachycardia   Codeine    Aspirin Other (See Comments)   Upset stomach, bleeding   Penicillins Hives, Rash   Has patient had a PCN reaction causing immediate rash, facial/tongue/throat swelling, SOB or lightheadedness with hypotension:No Has patient had a PCN reaction causing severe rash involving mucus membranes or skin necrosis:No Has patient had a PCN reaction that required hospitalization:No Has patient had a PCN reaction occurring within the last 10 years:Yes If all of the above answers are "NO", then may proceed with Cephalosporin use.        Medication List         Accurate as of October 20, 2020 11:11 AM. If you have any questions, ask your nurse or doctor.          acetaminophen 500 MG tablet Commonly known as: TYLENOL Take 1,000 mg by mouth 2 (two) times daily as needed (for pain.).   albuterol 108 (90 Base) MCG/ACT inhaler Commonly known as: Ventolin HFA Inhale 1-2 puffs into the lungs every 4 (four) hours as needed for wheezing or shortness of breath. INHALE TWO PUFFS INTO LUNGS EVERY 6 HOURS AS NEEDED FOR WHEEZING   budesonide-formoterol 160-4.5 MCG/ACT inhaler Commonly known as: Symbicort Inhale 2 puffs into the lungs in the morning and at bedtime. Needs to be seen for further refills.   cetirizine 10 MG tablet Commonly known as: ZYRTEC Take 10 mg by mouth at bedtime.   clonazePAM 0.5 MG tablet Commonly known as: KLONOPIN Take 0.5 tablets (0.25 mg total) by mouth at bedtime as needed for anxiety.   clopidogrel 75 MG tablet Commonly known as: PLAVIX Take 1 tablet (75 mg total) by mouth daily.   diclofenac Sodium 1 % Gel Commonly known as: Voltaren Apply 2 g topically 4 (four) times daily.   fluticasone 50 MCG/ACT nasal spray Commonly known as: FLONASE Place 1 spray into both nostrils 2 (two) times daily as needed for allergies or rhinitis.   LEG CRAMP RELIEF PO Take 2 tablets by mouth at bedtime. [employer]  levothyroxine 125 MCG tablet Commonly known as: SYNTHROID Take 1 tablet (125 mcg total) by mouth daily before breakfast.   levothyroxine 25 MCG tablet Commonly known as: Euthyrox TAKE 1/2 (ONE-HALF) TABLET BY MOUTH ONCE DAILY AS DIRECTED   losartan-hydrochlorothiazide 50-12.5 MG tablet Commonly known as: HYZAAR Take 1 tablet by mouth daily.   magnesium oxide 400 MG tablet Commonly known as: MAG-OX Take 400 mg by mouth at bedtime.   meloxicam 15 MG tablet Commonly known as: MOBIC Take 1 tablet (15 mg total) by mouth daily.   omeprazole 40 MG capsule Commonly known as: PRILOSEC Take 1 capsule (40 mg  total) by mouth in the morning and at bedtime.   PRESCRIPTION MEDICATION Oxygen 3.0 liters at night, prn use in day   rosuvastatin 10 MG tablet Commonly known as: Crestor Take 1 tablet (10 mg total) by mouth daily.   sucralfate 1 g tablet Commonly known as: CARAFATE TAKE 1 TABLET BY MOUTH 4 TIMES DAILY (WITH MEALS AND AT BEDTIME) What changed: See the new instructions.   SUPER B COMPLEX/C PO Take 1 capsule by mouth at bedtime.   traZODone 50 MG tablet Commonly known as: DESYREL Take 0.5-1 tablets (25-50 mg total) by mouth at bedtime as needed for sleep.         Objective:   BP 135/79   Pulse 80   Ht 5\' 7"  (1.702 m)   Wt 206 lb (93.4 kg)   SpO2 98%   BMI 32.26 kg/m   Wt Readings from Last 3 Encounters:  10/20/20 206 lb (93.4 kg)  08/11/20 204 lb (92.5 kg)  08/04/20 207 lb (93.9 kg)    Physical Exam Vitals and nursing note reviewed.  Constitutional:      General: He is not in acute distress.    Appearance: He is well-developed. He is not diaphoretic.  Neck:     Thyroid: No thyromegaly.  Musculoskeletal:        General: Swelling and tenderness present. Normal range of motion.  Skin:    General: Skin is warm and dry.     Findings: No rash.  Neurological:     Mental Status: He is alert and oriented to person, place, and time.     Coordination: Coordination normal.  Psychiatric:        Behavior: Behavior normal.   Knee injection: Consent form signed. Risk factors of bleeding and infection discussed with patient and patient is agreeable towards injection. Patient prepped with Betadine. Lateral approach towards injection used. Injected 80 mg of Depo-Medrol and 1 mL of 2% lidocaine. Patient tolerated procedure well and no side effects from noted. Minimal to no bleeding. Simple bandage applied after.    Assessment & Plan:   Problem List Items Addressed This Visit   None Visit Diagnoses     Primary osteoarthritis of left knee    -  Primary   Relevant  Medications   methylPREDNISolone acetate (DEPO-MEDROL) injection 80 mg (Start on 10/20/2020 11:30 AM)        Follow up plan: Return if symptoms worsen or fail to improve.  Counseling provided for all of the vaccine components No orders of the defined types were placed in this encounter.   Caryl Pina, MD [facility] 10/20/2020, 11:11 AM

## 2020-10-29 DIAGNOSIS — J449 Chronic obstructive pulmonary disease, unspecified: Secondary | ICD-10-CM | POA: Diagnosis not present

## 2020-11-07 NOTE — Progress Notes (Signed)
Carelink Summary Report / Loop Recorder 

## 2020-11-17 LAB — CUP PACEART REMOTE DEVICE CHECK
Date Time Interrogation Session: 20220720040305
Implantable Pulse Generator Implant Date: 20210708

## 2020-11-21 ENCOUNTER — Ambulatory Visit (INDEPENDENT_AMBULATORY_CARE_PROVIDER_SITE_OTHER): Payer: Medicare HMO

## 2020-11-21 DIAGNOSIS — Z8673 Personal history of transient ischemic attack (TIA), and cerebral infarction without residual deficits: Secondary | ICD-10-CM | POA: Diagnosis not present

## 2020-11-24 DIAGNOSIS — M545 Low back pain, unspecified: Secondary | ICD-10-CM | POA: Diagnosis not present

## 2020-11-24 DIAGNOSIS — M4316 Spondylolisthesis, lumbar region: Secondary | ICD-10-CM | POA: Diagnosis not present

## 2020-11-24 DIAGNOSIS — M5136 Other intervertebral disc degeneration, lumbar region: Secondary | ICD-10-CM | POA: Diagnosis not present

## 2020-11-24 DIAGNOSIS — M5416 Radiculopathy, lumbar region: Secondary | ICD-10-CM | POA: Diagnosis not present

## 2020-11-29 DIAGNOSIS — J449 Chronic obstructive pulmonary disease, unspecified: Secondary | ICD-10-CM | POA: Diagnosis not present

## 2020-12-15 NOTE — Progress Notes (Signed)
Carelink Summary Report / Loop Recorder 

## 2020-12-26 ENCOUNTER — Ambulatory Visit (INDEPENDENT_AMBULATORY_CARE_PROVIDER_SITE_OTHER): Payer: Medicare HMO

## 2020-12-26 DIAGNOSIS — I639 Cerebral infarction, unspecified: Secondary | ICD-10-CM

## 2020-12-27 LAB — CUP PACEART REMOTE DEVICE CHECK
Date Time Interrogation Session: 20220822040531
Implantable Pulse Generator Implant Date: 20210708

## 2020-12-30 DIAGNOSIS — J449 Chronic obstructive pulmonary disease, unspecified: Secondary | ICD-10-CM | POA: Diagnosis not present

## 2021-01-03 ENCOUNTER — Telehealth: Payer: Self-pay | Admitting: Family Medicine

## 2021-01-03 DIAGNOSIS — G8929 Other chronic pain: Secondary | ICD-10-CM

## 2021-01-03 DIAGNOSIS — M545 Low back pain, unspecified: Secondary | ICD-10-CM

## 2021-01-03 NOTE — Telephone Encounter (Signed)
[facility] called stating that they are still waiting on [place] to send referral info and that they need it by 12:45 today.

## 2021-01-04 NOTE — Telephone Encounter (Signed)
Sent referral 

## 2021-01-06 NOTE — Progress Notes (Signed)
Carelink Summary Report / Loop Recorder 

## 2021-01-10 DIAGNOSIS — M5440 Lumbago with sciatica, unspecified side: Secondary | ICD-10-CM | POA: Diagnosis not present

## 2021-01-10 DIAGNOSIS — M9903 Segmental and somatic dysfunction of lumbar region: Secondary | ICD-10-CM | POA: Diagnosis not present

## 2021-01-10 DIAGNOSIS — M47816 Spondylosis without myelopathy or radiculopathy, lumbar region: Secondary | ICD-10-CM | POA: Diagnosis not present

## 2021-01-12 DIAGNOSIS — M47816 Spondylosis without myelopathy or radiculopathy, lumbar region: Secondary | ICD-10-CM | POA: Diagnosis not present

## 2021-01-12 DIAGNOSIS — M9903 Segmental and somatic dysfunction of lumbar region: Secondary | ICD-10-CM | POA: Diagnosis not present

## 2021-01-12 DIAGNOSIS — M5440 Lumbago with sciatica, unspecified side: Secondary | ICD-10-CM | POA: Diagnosis not present

## 2021-01-17 DIAGNOSIS — M5440 Lumbago with sciatica, unspecified side: Secondary | ICD-10-CM | POA: Diagnosis not present

## 2021-01-17 DIAGNOSIS — M47816 Spondylosis without myelopathy or radiculopathy, lumbar region: Secondary | ICD-10-CM | POA: Diagnosis not present

## 2021-01-17 DIAGNOSIS — M9903 Segmental and somatic dysfunction of lumbar region: Secondary | ICD-10-CM | POA: Diagnosis not present

## 2021-01-19 DIAGNOSIS — M5440 Lumbago with sciatica, unspecified side: Secondary | ICD-10-CM | POA: Diagnosis not present

## 2021-01-19 DIAGNOSIS — M9903 Segmental and somatic dysfunction of lumbar region: Secondary | ICD-10-CM | POA: Diagnosis not present

## 2021-01-19 DIAGNOSIS — M47816 Spondylosis without myelopathy or radiculopathy, lumbar region: Secondary | ICD-10-CM | POA: Diagnosis not present

## 2021-01-23 DIAGNOSIS — M47816 Spondylosis without myelopathy or radiculopathy, lumbar region: Secondary | ICD-10-CM | POA: Diagnosis not present

## 2021-01-23 DIAGNOSIS — M5440 Lumbago with sciatica, unspecified side: Secondary | ICD-10-CM | POA: Diagnosis not present

## 2021-01-23 DIAGNOSIS — M9903 Segmental and somatic dysfunction of lumbar region: Secondary | ICD-10-CM | POA: Diagnosis not present

## 2021-01-26 DIAGNOSIS — M5440 Lumbago with sciatica, unspecified side: Secondary | ICD-10-CM | POA: Diagnosis not present

## 2021-01-26 DIAGNOSIS — M9903 Segmental and somatic dysfunction of lumbar region: Secondary | ICD-10-CM | POA: Diagnosis not present

## 2021-01-26 DIAGNOSIS — M47816 Spondylosis without myelopathy or radiculopathy, lumbar region: Secondary | ICD-10-CM | POA: Diagnosis not present

## 2021-01-29 DIAGNOSIS — J449 Chronic obstructive pulmonary disease, unspecified: Secondary | ICD-10-CM | POA: Diagnosis not present

## 2021-01-30 ENCOUNTER — Ambulatory Visit (INDEPENDENT_AMBULATORY_CARE_PROVIDER_SITE_OTHER): Payer: Medicare HMO

## 2021-01-30 DIAGNOSIS — I639 Cerebral infarction, unspecified: Secondary | ICD-10-CM | POA: Diagnosis not present

## 2021-01-30 LAB — CUP PACEART REMOTE DEVICE CHECK
Date Time Interrogation Session: 20220924040345
Implantable Pulse Generator Implant Date: 20210708

## 2021-01-31 DIAGNOSIS — M47816 Spondylosis without myelopathy or radiculopathy, lumbar region: Secondary | ICD-10-CM | POA: Diagnosis not present

## 2021-01-31 DIAGNOSIS — M9903 Segmental and somatic dysfunction of lumbar region: Secondary | ICD-10-CM | POA: Diagnosis not present

## 2021-01-31 DIAGNOSIS — M5440 Lumbago with sciatica, unspecified side: Secondary | ICD-10-CM | POA: Diagnosis not present

## 2021-02-02 ENCOUNTER — Other Ambulatory Visit: Payer: Self-pay | Admitting: Family Medicine

## 2021-02-02 DIAGNOSIS — M5440 Lumbago with sciatica, unspecified side: Secondary | ICD-10-CM | POA: Diagnosis not present

## 2021-02-02 DIAGNOSIS — M9903 Segmental and somatic dysfunction of lumbar region: Secondary | ICD-10-CM | POA: Diagnosis not present

## 2021-02-02 DIAGNOSIS — M47816 Spondylosis without myelopathy or radiculopathy, lumbar region: Secondary | ICD-10-CM | POA: Diagnosis not present

## 2021-02-06 DIAGNOSIS — M5440 Lumbago with sciatica, unspecified side: Secondary | ICD-10-CM | POA: Diagnosis not present

## 2021-02-06 DIAGNOSIS — M47816 Spondylosis without myelopathy or radiculopathy, lumbar region: Secondary | ICD-10-CM | POA: Diagnosis not present

## 2021-02-06 DIAGNOSIS — M9903 Segmental and somatic dysfunction of lumbar region: Secondary | ICD-10-CM | POA: Diagnosis not present

## 2021-02-07 NOTE — Progress Notes (Signed)
Carelink Summary Report / Loop Recorder 

## 2021-02-08 DIAGNOSIS — M47816 Spondylosis without myelopathy or radiculopathy, lumbar region: Secondary | ICD-10-CM | POA: Diagnosis not present

## 2021-02-08 DIAGNOSIS — M5440 Lumbago with sciatica, unspecified side: Secondary | ICD-10-CM | POA: Diagnosis not present

## 2021-02-08 DIAGNOSIS — M9903 Segmental and somatic dysfunction of lumbar region: Secondary | ICD-10-CM | POA: Diagnosis not present

## 2021-02-09 ENCOUNTER — Ambulatory Visit: Payer: Medicare HMO | Admitting: Adult Health

## 2021-02-09 ENCOUNTER — Encounter: Payer: Self-pay | Admitting: Adult Health

## 2021-02-09 VITALS — BP 119/72 | HR 78 | Ht 69.0 in | Wt 199.0 lb

## 2021-02-09 DIAGNOSIS — I639 Cerebral infarction, unspecified: Secondary | ICD-10-CM

## 2021-02-09 NOTE — Progress Notes (Signed)
[facility] [address] [place]. [place] [address] [phone]       STROKE FOLLOW UP NOTE  Mr. NORVAL SLAVEN Date of Birth:  12-Sep-1945 Medical Record Number:  [id number]   Reason for Referral: stroke follow up    SUBJECTIVE:   CHIEF COMPLAINT:  Chief Complaint  Patient presents with   Follow-up    Rm  3 with spouse becky  Pt still having L hand grip immobility/numbness, a lot of dizziness and more irritable per wife.     HPI:   Update 02/09/2021 JM: Patient returns for 69-month stroke follow-up accompanied by his wife.  Reports continued left arm numbness and weakness which has been stable without worsening.  He also reports greater difficulty sleeping and increased irritability. He requests a new prescription for clonazepam which was previously prescribed for his vertigo.  He has been using trazodone at night to help him sleep but does not like the way it makes him feel.  He reports the clonazepam helps him sleep better.  He has been having worsening lower back pain followed by Dr. Nelva Bush and seen by neurosurgery but was told "there are no further options".  He is currently being seen by a chiropractor which has been helping but still greatly limited in regards to activity and doing things he used to enjoy. He reports trying left over steroid medications he had at home which significantly improved his back pain and doesn't understand why no one will prescribe this for him. After further discussing why it is not recommended to be on long-term steroids, he became very upset stating "I am 75 years old, I don't care what happens to me. I just want to feel better". Discussed being seen by a pain management doctor for further possible options which also made him upset. He then proceeded to say "well you are not a doctor so what would you know". Attempted to change conversation topic/redirect but patient demanded that the visit be done at that point.  It was ensured he  was still taking Plavix which he has remained on but self discontinued statin due to causing muscle aches.   History provided for reference purposes only Update 08/04/2020 JM: Mr. Currey returns for 34-month stroke follow-up accompanied by his wife  Stable since prior visit without new stroke/TIA symptoms Reports residual left hand weakness with numbness/tingling - greatest concern is the pain with hitting golf balls but he has been slowly trying to swing a club.  No additional vertigo or dizziness issues - will occassionally get dizzy upon standing but quickly resolves Since completed therapies but continues to do exercises at home  Anxiety has been stable - he has continued to use clonazepam 0.5 mg twice daily although prescribed initially as needed for vertigo and severe anxiety associated with vertigo/dizziness. He believes clonazepam has been helping with his sleep and has experienced minimal anxiety  Compliant on Plavix and pravastatin - he does report issues with proximal lower extremity cramping with taking multiple supplements trying to ease symptoms but no relief - he does have hx of prior statin intolerance  Blood pressure today 119/73 - occasionally monitors at home and typically stable Loop recorder has not shown atrial fibrillation thus far  No further concerns at this time  Update 04/05/2020 JM: Mr. Haro returns for 15-month stroke follow-up accompanied by his wife.  Complains of residual intermittent dizziness, vertigo and imbalance but greatly improving.  Also reports decreased left hand dexterity and numbness/tingling consistent with ulnar nerve  distribution.  Reports history of ulnar nerve decompression in 2016.  He continues to do arm exercises daily and is frustrated as he recently attempted to drive a golf ball but experienced throbbing pain in his left hand.  Intermittent use of clonazepam which has been beneficial in regards to anxiety interfering with sleep.  He has not trialed  use of clonazepam with onset of vertigo.  Has been using OTC Dramamine with benefit of dizziness.  Denies new or worsening stroke/TIA symptoms.  Remains on Plavix and pravastatin for secondary stroke prevention without side effects.  Blood pressure today 119/74.  Loop recorder has not shown atrial fibrillation thus far.  No further concerns at this time.  Initial visit 12/30/2019 JM: Mr. Mcnamee is being seen for hospital follow-up accompanied by his wife Residual deficits continued left hand weakness with numbness, vertigo, imbalance and anxiety He has since completed therapy but continues to do exercises at home He is frustrated due to intermittent vertigo and imbalance limiting activity.  He does report improvement and is currently ambulating without assistive device previously ambulating with a cane at hospital discharge He has had difficulty sleeping at night due to increased anxiety and feeling as though he is not getting enough oxygen even when wearing 3L via [place].  He feels as though difficulty sleeping at night has prevented him from further improvement.  He has previously used clonazepam in the past with benefit.  Denies new or worsening stroke/TIA symptoms Completed 3 weeks DAPT and remains on Plavix alone without bleeding or bruising Continues on pravastatin 40 mg daily without myalgias Blood pressure today 119/73 Loop recorder has not shown atrial fibrillation thus far No further concerns at this time  Stroke admission 11/03/2019 Mr. MUAAZ BRAU is a 75 y.o. male with history of tobacco abuse, stroke, low back pain, hypertension, dysrhythmia, arthritis who presented on 11/03/2019 with L arm weakness and numbness.  Stroke work-up revealed right corona radiata and right cerebellar infarcts in 2 different vascular territories, infarcts embolic secondary to unknown source.  Loop recorder placed to assess for atrial fibrillation as potential etiology.  Recommended DAPT for 3 weeks then Plavix  alone. Hx of HTN elevated arrival stabilized with long-term BP goal normotensive range.  LDL 34 initiate atorvastatin 40 mg daily.  Other stroke risk factors include advanced age, former tobacco use, EtOH use, obesity, family history of stroke and personal history of stroke prevention.  Other active problems include COPD on home O2.  Evaluated by therapy and discharged home with recommendation of outpatient PT.   Stroke:   R corona radiata and R cerebellar infarcts in 2 different vascular territories, infarcts embolic secondary to unknown source CT head age indeterminate R cerebellar infarct new from 02/18/2015. New chronic L basal ganglia lacune. Old small R caudate nucleus and L cerebellar infarcts. Small vessel disease. Atrophy. Sinus dz. MRI  Small R corona radiata infarct. Few acute/subacute R cerebellar infarcts. Small vessel disease.  MRA  Unremarkable  Carotid Doppler  B ICA 1-39% stenosis, VAs antegrade  2D Echo normal ejection fraction 60 to 65%.  No wall motion abnormalities.   Loop recorder inserted 11/05/2019 LDL 35 HgbA1c 5.7 VTE prophylaxis - Heparin 5000 units sq tid  No antithrombotic prior to admission (no aspirin as on Meloxicam for arthritis), now on clopidogrel 75 mg daily. Listed allergy to aspirin of "upset stomach, bleeding". Dr. Leonie Man recommends DAPT x 3 weeks then plavix alone if pt can tolerate.    Therapy recommendations:  OP PT, no  SLP Disposition:  Return home     ROS:   14 system review of systems performed and negative with exception of see HPI  PMH:  Past Medical History:  Diagnosis Date   Allergic rhinitis    Arthritis    BOOP (bronchiolitis obliterans with organizing pneumonia) ([place])    Bronchitis    Colon polyps    COPD (chronic obstructive pulmonary disease) ([place])    Dyspnea    with exertion    Dysrhythmia    skipped beats- followed by Dr Angelena Form    GERD (gastroesophageal reflux disease)    Hypertension    Hypothyroidism    Kidney stone     Low back pain    Oxygen dependent    uses 3L/[place] at nite occasional during day if needed    Stroke [facility])    hx of 2 strokes which patient did not know he had had    Thyroid disease    Tobacco dependence     PSH:  Past Surgical History:  Procedure Laterality Date   BIOPSY  08/18/2019   Procedure: BIOPSY;  Surgeon: Irene Shipper, MD;  Location: [facility];  Service: Endoscopy;;   broncoscopy      carpel tunnal     left   COLONOSCOPY     x 4   DUPUYTREN CONTRACTURE RELEASE     Left   ESOPHAGOGASTRODUODENOSCOPY (EGD) WITH PROPOFOL N/A 03/26/2016   Procedure: ESOPHAGOGASTRODUODENOSCOPY (EGD) WITH PROPOFOL;  Surgeon: Irene Shipper, MD;  Location: [facility];  Service: Endoscopy;  Laterality: N/A;   ESOPHAGOGASTRODUODENOSCOPY (EGD) WITH PROPOFOL N/A 08/18/2019   Procedure: ESOPHAGOGASTRODUODENOSCOPY (EGD) WITH PROPOFOL;  Surgeon: Irene Shipper, MD;  Location: [facility];  Service: Endoscopy;  Laterality: N/A;  Possible dilation   ESOPHAGOGASTRODUODENOSCOPY ENDOSCOPY     x 3   FACIAL FRACTURE SURGERY     LOOP RECORDER INSERTION N/A 11/05/2019   Procedure: LOOP RECORDER INSERTION;  Surgeon: Thompson Grayer, MD;  Location: [facility];  Service: Cardiovascular;  Laterality: N/A;   OTHER SURGICAL HISTORY     facial surgery   SAVORY DILATION N/A 08/18/2019   Procedure: SAVORY DILATION;  Surgeon: Irene Shipper, MD;  Location: [facility];  Service: Endoscopy;  Laterality: N/A;   TOTAL HIP ARTHROPLASTY  01/2011   Right hip, [place]   ULNAR NERVE TRANSPOSITION     left    Social History:  Social History   Socioeconomic History   Marital status: Married    Spouse name: Not on file   Number of children: 2   Years of education: 11   Highest education level: 11th grade  Occupational History   Occupation: Retired    Comment: [employer], 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, also worked in [place] Use   Smoking status: Former    Packs/day: 1.00    Years: 50.00    Pack years:  50.00    Types: Cigarettes    Quit date: 07/30/2010    Years since quitting: 10.5   Smokeless tobacco: Current    Types: Snuff  Vaping Use   Vaping Use: Never used  Substance and Sexual Activity   Alcohol use: Yes    Comment: rare   Drug use: No   Sexual activity: Not on file  Other Topics Concern   Not on file  Social History Narrative   Not on file   Social Determinants of Health   Financial Resource Strain: Not on file  Food Insecurity: Not on file  Transportation Needs: Not on file  Physical Activity: Not on file  Stress: Not on file  Social Connections: Not on file  Intimate Partner Violence: Not on file    Family History:  Family History  Problem Relation Age of Onset   Arthritis Father    Stroke Father 35   Prostate cancer Maternal Grandfather    Stomach cancer Maternal Grandmother    Cancer Paternal Grandmother    Colon cancer Other        mat 1st cousin   Cancer Mother    Healthy Daughter    Healthy Son    Pancreatic cancer Neg Hx    Esophageal cancer Neg Hx     Medications:   Current Outpatient Medications on File Prior to Visit  Medication Sig Dispense Refill   acetaminophen (TYLENOL) 500 MG tablet Take 1,000 mg by mouth 2 (two) times daily as needed (for pain.).     albuterol (VENTOLIN HFA) 108 (90 Base) MCG/ACT inhaler Inhale 1-2 puffs into the lungs every 4 (four) hours as needed for wheezing or shortness of breath. INHALE TWO PUFFS INTO LUNGS EVERY 6 HOURS AS NEEDED FOR WHEEZING 18 g 3   budesonide-formoterol (SYMBICORT) 160-4.5 MCG/ACT inhaler Inhale 2 puffs into the lungs in the morning and at bedtime. Needs to be seen for further refills. 3 each 3   cetirizine (ZYRTEC) 10 MG tablet Take 10 mg by mouth at bedtime.      clonazePAM (KLONOPIN) 0.5 MG tablet Take 0.5 tablets (0.25 mg total) by mouth at bedtime as needed for anxiety. 15 tablet 0   clopidogrel (PLAVIX) 75 MG tablet Take 1 tablet (75 mg total) by mouth daily. 90 tablet 3   fluticasone  (FLONASE) 50 MCG/ACT nasal spray Place 1 spray into both nostrils 2 (two) times daily as needed for allergies or rhinitis. 48 g 3   Homeopathic Products (LEG CRAMP RELIEF PO) Take 2 tablets by mouth at bedtime. Hyland's     levothyroxine (SYNTHROID) 125 MCG tablet Take 1 tablet (125 mcg total) by mouth daily before breakfast. 90 tablet 3   levothyroxine (SYNTHROID) 25 MCG tablet TAKE 1/2 (ONE-HALF) TABLET BY MOUTH ONCE DAILY AS DIRECTED along with 125 mg daily 45 tablet 1   losartan-hydrochlorothiazide (HYZAAR) 50-12.5 MG tablet Take 1 tablet by mouth daily. 90 tablet 3   magnesium oxide (MAG-OX) 400 MG tablet Take 400 mg by mouth at bedtime.      meloxicam (MOBIC) 15 MG tablet Take 1 tablet (15 mg total) by mouth daily. 90 tablet 3   omeprazole (PRILOSEC) 40 MG capsule Take 1 capsule (40 mg total) by mouth in the morning and at bedtime. 180 capsule 3   PRESCRIPTION MEDICATION Oxygen 3.0 liters at night, prn use in day     sucralfate (CARAFATE) 1 g tablet TAKE 1 TABLET BY MOUTH 4 TIMES DAILY (WITH MEALS AND AT BEDTIME) (Patient taking differently: Take 1 g by mouth as needed (ulcers).) 120 tablet 2   SUPER B COMPLEX/C PO Take 1 capsule by mouth at bedtime.     traZODone (DESYREL) 50 MG tablet Take 0.5-1 tablets (25-50 mg total) by mouth at bedtime as needed for sleep. 90 tablet 3   No current facility-administered medications on file prior to visit.    Allergies:   Allergies  Allergen Reactions   Nuts Nausea And Vomiting    sweating   Other Nausea And Vomiting    Per patient, cannot take any narcotic. Nausea and vomiting sweating   Vicodin [  Hydrocodone-Acetaminophen]     tachycardia   Codeine    Aspirin Other (See Comments)    Upset stomach, bleeding   Penicillins Hives and Rash    Has patient had a PCN reaction causing immediate rash, facial/tongue/throat swelling, SOB or lightheadedness with hypotension:No Has patient had a PCN reaction causing severe rash involving mucus membranes  or skin necrosis:No Has patient had a PCN reaction that required hospitalization:No Has patient had a PCN reaction occurring within the last 10 years:Yes If all of the above answers are "NO", then may proceed with Cephalosporin use.       OBJECTIVE:  Physical Exam  Vitals:   02/09/21 1332  BP: 119/72  Pulse: 78  Weight: 199 lb (90.3 kg)  Height: 5\' 9"  (1.753 m)   Body mass index is 29.39 kg/m. No results found.  Unable to complete physical exam (see HPI)      ASSESSMENT: DELMON ANDRADA is a 75 y.o. year old male presented with left arm weakness and numbness on 11/03/2019 with stroke work-up revealing right corona radiata and R cerebellar infarcts in 2 different vascular territories, infarcts embolic secondary to unknown source. Vascular risk factors include HTN, HLD, advanced age, former tobacco use, EtOH use, obesity, history of stroke on imaging and family history of stroke.     PLAN:  R CR and cerebellar infarcts, cryptogenic:  Residual deficit: Decreased left hand dexterity with numbness/tingling Unable to fully assess (see HPI) - he was advised in the beginning of visit that these residual deficits are likely permanent at this time but he has been gradually excepting this and compensating Continue clopidogrel 75 mg daily  and pravastatin for secondary stroke prevention. Loop recorder has not shown atrial fibrillation thus far -continuously monitored by cardiology Discussed secondary stroke prevention measures and importance of close PCP follow up for aggressive stroke risk factor management  Anxiety: He was advised to follow up with his PCP regarding this concern.  Clonazepam will not be reordered as it was initially ordered for poststroke vertigo which unfortunately he was using twice daily for anxiety and sleep reasons -this was fully discussed at prior visit to ensure he did not abruptly discontinue. Became irate during visit during discussion regarding low back pain  and treatment options (more so, long term steroid use for chronic low back pain not usually recommended).  HTN:  BP goal <130/90.  Stable on losartan-hydrochlorothiazide per PCP HLD:  LDL goal <70. Self discontinued statin due to intolerance. F/u with PCP    Patient will be discharged from clinic due to breakdown of patient and provider relationship with inappropriate behavior during visit. He will continue to follow with PCP for aggressive stroke risk factor management   CC:  [facility] provider: Dr. Leonie Man [name], [name], MD    I spent 24 minutes of face-to-face and non-face-to-face time with patient and wife.  This included previsit chart review, lab review, study review,  electronic health record documentation, patient and wife education discussion regarding prior stroke and residual deficits, monitoring of loop recorder and importance of managing stroke risk factors, chronic low back pain and typical treatment options as well as ongoing anxiety   Frann Rider, AGNP-BC  [facility] [address] [place], [place] [address]  Phone [phone] Fax [phone] Note: This document was prepared with digital dictation and possible smart phrase technology. Any transcriptional errors that result from this process are unintentional.

## 2021-02-09 NOTE — Patient Instructions (Signed)
Continue clopidogrel 75 mg daily for secondary stroke prevention  Continue to follow up with PCP regarding cholesterol and blood pressure management  Maintain strict control of hypertension with blood pressure goal below 130/90 and cholesterol with LDL cholesterol (bad cholesterol) goal below 70 mg/dL.         Thank you for coming to see [place] at [facility]. I hope we have been able to provide you high quality care today.  You may receive a patient satisfaction survey over the next few weeks. We would appreciate your feedback and comments so that we may continue to improve ourselves and the health of our patients.

## 2021-02-13 ENCOUNTER — Other Ambulatory Visit: Payer: Self-pay

## 2021-02-13 ENCOUNTER — Ambulatory Visit (INDEPENDENT_AMBULATORY_CARE_PROVIDER_SITE_OTHER): Payer: Medicare HMO

## 2021-02-13 ENCOUNTER — Encounter: Payer: Self-pay | Admitting: Family Medicine

## 2021-02-13 ENCOUNTER — Encounter: Payer: Self-pay | Admitting: Neurology

## 2021-02-13 ENCOUNTER — Ambulatory Visit (INDEPENDENT_AMBULATORY_CARE_PROVIDER_SITE_OTHER): Payer: Medicare HMO | Admitting: Family Medicine

## 2021-02-13 VITALS — BP 141/82 | HR 97 | Ht 69.0 in | Wt 197.0 lb

## 2021-02-13 DIAGNOSIS — I10 Essential (primary) hypertension: Secondary | ICD-10-CM | POA: Diagnosis not present

## 2021-02-13 DIAGNOSIS — E039 Hypothyroidism, unspecified: Secondary | ICD-10-CM | POA: Diagnosis not present

## 2021-02-13 DIAGNOSIS — Z23 Encounter for immunization: Secondary | ICD-10-CM | POA: Diagnosis not present

## 2021-02-13 DIAGNOSIS — I6930 Unspecified sequelae of cerebral infarction: Secondary | ICD-10-CM | POA: Diagnosis not present

## 2021-02-13 DIAGNOSIS — N1832 Chronic kidney disease, stage 3b: Secondary | ICD-10-CM

## 2021-02-13 DIAGNOSIS — M1712 Unilateral primary osteoarthritis, left knee: Secondary | ICD-10-CM

## 2021-02-13 DIAGNOSIS — M25562 Pain in left knee: Secondary | ICD-10-CM | POA: Diagnosis not present

## 2021-02-13 DIAGNOSIS — I6389 Other cerebral infarction: Secondary | ICD-10-CM | POA: Diagnosis not present

## 2021-02-13 DIAGNOSIS — E785 Hyperlipidemia, unspecified: Secondary | ICD-10-CM | POA: Diagnosis not present

## 2021-02-13 MED ORDER — METHYLPREDNISOLONE ACETATE 40 MG/ML IJ SUSP
80.0000 mg | Freq: Once | INTRAMUSCULAR | Status: AC
  Administered 2021-02-13: 80 mg via INTRAMUSCULAR

## 2021-02-13 MED ORDER — LEVOTHYROXINE SODIUM 125 MCG PO TABS: 125.0000 ug | ORAL_TABLET | Freq: Every day | ORAL | 3 refills | Status: DC

## 2021-02-13 MED ORDER — TRAZODONE HCL 50 MG PO TABS: 25.0000 mg | ORAL_TABLET | Freq: Every evening | ORAL | 3 refills | Status: DC | PRN

## 2021-02-13 MED ORDER — CLOPIDOGREL BISULFATE 75 MG PO TABS: 75.0000 mg | ORAL_TABLET | Freq: Every day | ORAL | 3 refills | Status: DC

## 2021-02-13 MED ORDER — MELOXICAM 15 MG PO TABS: 15.0000 mg | ORAL_TABLET | Freq: Every day | ORAL | 3 refills | Status: DC

## 2021-02-13 MED ORDER — LOSARTAN POTASSIUM-HCTZ 50-12.5 MG PO TABS: 1.0000 | ORAL_TABLET | Freq: Every day | ORAL | 3 refills | Status: DC

## 2021-02-13 MED ORDER — FLUTICASONE PROPIONATE 50 MCG/ACT NA SUSP: 1.0000 | Freq: Two times a day (BID) | NASAL | 3 refills | Status: DC | PRN

## 2021-02-13 MED ORDER — OMEPRAZOLE 40 MG PO CPDR: 40.0000 mg | DELAYED_RELEASE_CAPSULE | Freq: Two times a day (BID) | ORAL | 3 refills | Status: DC

## 2021-02-13 MED ORDER — LEVOTHYROXINE SODIUM 25 MCG PO TABS: ORAL_TABLET | ORAL | 1 refills | Status: DC

## 2021-02-13 NOTE — Progress Notes (Signed)
BP (!) 141/82   Pulse 97   Ht _0  (1.753 m)   Wt 197 lb (89.4 kg)   SpO2 93%   BMI 29.09 kg/m    Subjective:   Patient ID: Antonio Bowen, male    DOB: 1945/05/10, 74 y.o.   MRN: [id number]  HPI: [name] is a 75 y.o. male presenting on 02/13/2021 for Hypothyroidism and Knee Pain (left)   HPI Hypothyroidism recheck Patient is coming in for thyroid recheck today as well. They deny any issues with hair changes or heat or cold problems or diarrhea or constipation. They deny any chest pain or palpitations. They are currently on levothyroxine 137 micrograms   Hypertension Patient is currently on losartan hydrochlorothiazide, and their blood pressure today is 141/82. Patient denies any lightheadedness or dizziness. Patient denies headaches, blurred vision, chest pains, shortness of breath, or weakness. Denies any side effects from medication and is content with current medication.   CKD recheck states 3 Patient is coming in for recheck of CKD stage III.  He denies any urinary issues.  We will check to the blood work.  Hyperlipidemia Patient is coming in for recheck of his hyperlipidemia. The patient is currently taking no medication currently, does take clopidogrel because of history of CVA.Marland Kitchen They deny any issues with myalgias or history of liver damage from it. They deny any focal numbness or weakness or chest pain.   Left knee pain Patient is coming in for left knee pain that is continued, he has arthritis in that knee and the injection that we did give last time helped with his about 4 months ago.  Patient denies any popping or catching or giving way but just has pain especially on the lateral aspect of the left knee.  Pain is worse with going up stairs or getting started first thing after sitting for some time.  He had injection 4 months ago and it just started hurting him 2 or 3 weeks ago.  Relevant past medical, surgical, family and social history reviewed and updated as  indicated. Interim medical history since our last visit reviewed. Allergies and medications reviewed and updated.  Review of Systems  Constitutional:  Negative for chills and fever.  Respiratory:  Negative for shortness of breath and wheezing.   Cardiovascular:  Negative for chest pain and leg swelling.  Musculoskeletal:  Positive for arthralgias. Negative for back pain, gait problem and joint swelling.  Skin:  Negative for color change and rash.  All other systems reviewed and are negative.  Per HPI unless specifically indicated above   Allergies as of 02/13/2021       Reactions   Nuts Nausea And Vomiting   sweating   Other Nausea And Vomiting   Per patient, cannot take any narcotic. Nausea and vomiting sweating   Vicodin [hydrocodone-acetaminophen]    tachycardia   Codeine    Aspirin Other (See Comments)   Upset stomach, bleeding   Penicillins Hives, Rash   Has patient had a PCN reaction causing immediate rash, facial/tongue/throat swelling, SOB or lightheadedness with hypotension:No Has patient had a PCN reaction causing severe rash involving mucus membranes or skin necrosis:No Has patient had a PCN reaction that required hospitalization:No Has patient had a PCN reaction occurring within the last 10 years:Yes If all of the above answers are "NO", then may proceed with Cephalosporin use.        Medication List        Accurate as of February 13, 2021  2:37 PM. If you have any questions, ask your nurse or doctor.          STOP taking these medications    clonazePAM 0.5 MG tablet Commonly known as: KLONOPIN Stopped by: [name] Fahim Kats, MD   sucralfate 1 g tablet Commonly known as: CARAFATE Stopped by: [name] Jef Futch, MD       TAKE these medications    acetaminophen 500 MG tablet Commonly known as: TYLENOL Take 1,000 mg by mouth 2 (two) times daily as needed (for pain.).   albuterol 108 (90 Base) MCG/ACT inhaler Commonly known as: Ventolin  HFA Inhale 1-2 puffs into the lungs every 4 (four) hours as needed for wheezing or shortness of breath. INHALE TWO PUFFS INTO LUNGS EVERY 6 HOURS AS NEEDED FOR WHEEZING   budesonide-formoterol 160-4.5 MCG/ACT inhaler Commonly known as: Symbicort Inhale 2 puffs into the lungs in the morning and at bedtime. Needs to be seen for further refills.   cetirizine 10 MG tablet Commonly known as: ZYRTEC Take 10 mg by mouth at bedtime.   clopidogrel 75 MG tablet Commonly known as: PLAVIX Take 1 tablet (75 mg total) by mouth daily.   fluticasone 50 MCG/ACT nasal spray Commonly known as: FLONASE Place 1 spray into both nostrils 2 (two) times daily as needed for allergies or rhinitis.   LEG CRAMP RELIEF PO Take 2 tablets by mouth at bedtime. [employer]   levothyroxine 125 MCG tablet Commonly known as: SYNTHROID Take 1 tablet (125 mcg total) by mouth daily before breakfast.   levothyroxine 25 MCG tablet Commonly known as: SYNTHROID TAKE 1/2 (ONE-HALF) TABLET BY MOUTH ONCE DAILY AS DIRECTED along with 125 mg daily   losartan-hydrochlorothiazide 50-12.5 MG tablet Commonly known as: HYZAAR Take 1 tablet by mouth daily.   magnesium oxide 400 MG tablet Commonly known as: MAG-OX Take 400 mg by mouth at bedtime.   meloxicam 15 MG tablet Commonly known as: MOBIC Take 1 tablet (15 mg total) by mouth daily.   omeprazole 40 MG capsule Commonly known as: PRILOSEC Take 1 capsule (40 mg total) by mouth in the morning and at bedtime.   PRESCRIPTION MEDICATION Oxygen 3.0 liters at night, prn use in day   SUPER B COMPLEX/C PO Take 1 capsule by mouth at bedtime.   traZODone 50 MG tablet Commonly known as: DESYREL Take 0.5-1 tablets (25-50 mg total) by mouth at bedtime as needed for sleep.         Objective:   BP (!) 141/82   Pulse 97   Ht _0  (1.753 m)   Wt 197 lb (89.4 kg)   SpO2 93%   BMI 29.09 kg/m   Wt Readings from Last 3 Encounters:  02/13/21 197 lb (89.4 kg)  02/09/21  199 lb (90.3 kg)  10/20/20 206 lb (93.4 kg)    Physical Exam Vitals and nursing note reviewed.  Constitutional:      General: He is not in acute distress.    Appearance: He is well-developed. He is not diaphoretic.  Eyes:     General: No scleral icterus.    Conjunctiva/sclera: Conjunctivae normal.  Neck:     Thyroid: No thyromegaly.  Cardiovascular:     Rate and Rhythm: Normal rate and regular rhythm.     Heart sounds: Normal heart sounds. No murmur heard. Pulmonary:     Effort: Pulmonary effort is normal. No respiratory distress.     Breath sounds: Normal breath sounds. No wheezing.  Musculoskeletal:  General: Normal range of motion.     Cervical back: Neck supple.     Left knee: Crepitus present. No swelling, deformity, effusion or bony tenderness. Normal range of motion. Tenderness present over the medial joint line and lateral joint line. No LCL laxity, MCL laxity or PCL laxity.Normal alignment and normal meniscus.  Lymphadenopathy:     Cervical: No cervical adenopathy.  Skin:    General: Skin is warm and dry.     Findings: No rash.  Neurological:     Mental Status: He is alert and oriented to person, place, and time.     Coordination: Coordination normal.  Psychiatric:        Behavior: Behavior normal.    Left knee x-ray: Medial compartmental and lateral compartmental narrowing, and left knee essentially bone-on-bone in the medial compartment.  Await final read from radiology.  Knee injection: Consent form signed. Risk factors of bleeding and infection discussed with patient and patient is agreeable towards injection. Patient prepped with Betadine. Lateral approach towards injection used. Injected 80 mg of Depo-Medrol and 1 mL of 2% lidocaine. Patient tolerated procedure well and no side effects from noted. Minimal to no bleeding. Simple bandage applied after.   Assessment & Plan:   Problem List Items Addressed This Visit       Cardiovascular and Mediastinum    Hypertension   Relevant Medications   losartan-hydrochlorothiazide (HYZAAR) 50-12.5 MG tablet   Other Relevant Orders   CBC with Differential/Platelet   CMP14+EGFR     Endocrine   Hypothyroidism - Primary (Chronic)   Relevant Medications   levothyroxine (SYNTHROID) 125 MCG tablet   levothyroxine (SYNTHROID) 25 MCG tablet   Other Relevant Orders   TSH     Genitourinary   Chronic kidney disease (CKD), stage III (moderate) (HCC)   Relevant Orders   CBC with Differential/Platelet   CMP14+EGFR     Other   Hyperlipidemia LDL goal <130   Relevant Medications   losartan-hydrochlorothiazide (HYZAAR) 50-12.5 MG tablet   Other Relevant Orders   Lipid panel   Other Visit Diagnoses     Cerebrovascular accident (CVA) due to other mechanism ([place])       Relevant Medications   clopidogrel (PLAVIX) 75 MG tablet   losartan-hydrochlorothiazide (HYZAAR) 50-12.5 MG tablet   Other Relevant Orders   CBC with Differential/Platelet   Essential hypertension       Relevant Medications   losartan-hydrochlorothiazide (HYZAAR) 50-12.5 MG tablet   Other Relevant Orders   CMP14+EGFR   Primary osteoarthritis of left knee       Relevant Medications   meloxicam (MOBIC) 15 MG tablet   methylPREDNISolone acetate (DEPO-MEDROL) injection 80 mg (Start on 02/13/2021  2:45 PM)   Other Relevant Orders   DG Knee 1-2 Views Left       Patient doing well with current medicine, gave sample for breztri because he did not have Symbicort all the time because of the donut hole and he says it works just as well.  No other changes in medication, will do blood work Follow up plan: Return in about 6 months (around 08/14/2021), or if symptoms worsen or fail to improve, for Hypertension and cholesterol and COPD.  Counseling provided for all of the vaccine components Orders Placed This Encounter  Procedures   DG Knee 1-2 Views Left   CBC with Differential/Platelet   CMP14+EGFR   Lipid panel   TSH     Caryl Pina, MD [facility] 02/13/2021, 2:37 PM

## 2021-02-14 DIAGNOSIS — M47816 Spondylosis without myelopathy or radiculopathy, lumbar region: Secondary | ICD-10-CM | POA: Diagnosis not present

## 2021-02-14 DIAGNOSIS — M9903 Segmental and somatic dysfunction of lumbar region: Secondary | ICD-10-CM | POA: Diagnosis not present

## 2021-02-14 DIAGNOSIS — M5440 Lumbago with sciatica, unspecified side: Secondary | ICD-10-CM | POA: Diagnosis not present

## 2021-02-14 LAB — CMP14+EGFR
ALT: 36 IU/L (ref 0–44)
AST: 32 IU/L (ref 0–40)
Albumin/Globulin Ratio: 2 (ref 1.2–2.2)
Albumin: 4.4 g/dL (ref 3.7–4.7)
Alkaline Phosphatase: 94 IU/L (ref 44–121)
BUN/Creatinine Ratio: 8 — ABNORMAL LOW (ref 10–24)
BUN: 17 mg/dL (ref 8–27)
Bilirubin Total: 0.4 mg/dL (ref 0.0–1.2)
CO2: 18 mmol/L — ABNORMAL LOW (ref 20–29)
Calcium: 9.7 mg/dL (ref 8.6–10.2)
Chloride: 108 mmol/L — ABNORMAL HIGH (ref 96–106)
Creatinine, Ser: 2.03 mg/dL — ABNORMAL HIGH (ref 0.76–1.27)
Globulin, Total: 2.2 g/dL (ref 1.5–4.5)
Glucose: 106 mg/dL — ABNORMAL HIGH (ref 70–99)
Potassium: 4.8 mmol/L (ref 3.5–5.2)
Sodium: 142 mmol/L (ref 134–144)
Total Protein: 6.6 g/dL (ref 6.0–8.5)
eGFR: 34 mL/min/{1.73_m2} — ABNORMAL LOW (ref >59)

## 2021-02-14 LAB — CBC WITH DIFFERENTIAL/PLATELET
Basophils Absolute: 0.1 10*3/uL (ref 0.0–0.2)
Basos: 1 %
EOS (ABSOLUTE): 0.1 10*3/uL (ref 0.0–0.4)
Eos: 1 %
Hematocrit: 46.7 % (ref 37.5–51.0)
Hemoglobin: 16.1 g/dL (ref 13.0–17.7)
Immature Grans (Abs): 0.1 10*3/uL (ref 0.0–0.1)
Immature Granulocytes: 1 %
Lymphocytes Absolute: 2.3 10*3/uL (ref 0.7–3.1)
Lymphs: 24 %
MCH: 31.8 pg (ref 26.6–33.0)
MCHC: 34.5 g/dL (ref 31.5–35.7)
MCV: 92 fL (ref 79–97)
Monocytes Absolute: 0.7 10*3/uL (ref 0.1–0.9)
Monocytes: 7 %
Neutrophils Absolute: 6.7 10*3/uL (ref 1.4–7.0)
Neutrophils: 66 %
Platelets: 200 10*3/uL (ref 150–450)
RBC: 5.07 x10E6/uL (ref 4.14–5.80)
RDW: 12.7 % (ref 11.6–15.4)
WBC: 9.8 10*3/uL (ref 3.4–10.8)

## 2021-02-14 LAB — LIPID PANEL
Chol/HDL Ratio: 2.1 ratio (ref 0.0–5.0)
Cholesterol, Total: 94 mg/dL — ABNORMAL LOW (ref 100–199)
HDL: 44 mg/dL (ref >39)
LDL Chol Calc (NIH): 28 mg/dL (ref 0–99)
Triglycerides: 127 mg/dL (ref 0–149)
VLDL Cholesterol Cal: 22 mg/dL (ref 5–40)

## 2021-02-14 LAB — TSH: TSH: 2.62 u[IU]/mL (ref 0.450–4.500)

## 2021-02-16 DIAGNOSIS — M9903 Segmental and somatic dysfunction of lumbar region: Secondary | ICD-10-CM | POA: Diagnosis not present

## 2021-02-16 DIAGNOSIS — M5440 Lumbago with sciatica, unspecified side: Secondary | ICD-10-CM | POA: Diagnosis not present

## 2021-02-16 DIAGNOSIS — M47816 Spondylosis without myelopathy or radiculopathy, lumbar region: Secondary | ICD-10-CM | POA: Diagnosis not present

## 2021-02-21 DIAGNOSIS — M5440 Lumbago with sciatica, unspecified side: Secondary | ICD-10-CM | POA: Diagnosis not present

## 2021-02-21 DIAGNOSIS — M9903 Segmental and somatic dysfunction of lumbar region: Secondary | ICD-10-CM | POA: Diagnosis not present

## 2021-02-21 DIAGNOSIS — M47816 Spondylosis without myelopathy or radiculopathy, lumbar region: Secondary | ICD-10-CM | POA: Diagnosis not present

## 2021-02-23 DIAGNOSIS — M47816 Spondylosis without myelopathy or radiculopathy, lumbar region: Secondary | ICD-10-CM | POA: Diagnosis not present

## 2021-02-23 DIAGNOSIS — M5440 Lumbago with sciatica, unspecified side: Secondary | ICD-10-CM | POA: Diagnosis not present

## 2021-02-23 DIAGNOSIS — M9903 Segmental and somatic dysfunction of lumbar region: Secondary | ICD-10-CM | POA: Diagnosis not present

## 2021-02-23 LAB — CUP PACEART REMOTE DEVICE CHECK
Date Time Interrogation Session: 20221027040320
Implantable Pulse Generator Implant Date: 20210708

## 2021-02-27 DIAGNOSIS — M5440 Lumbago with sciatica, unspecified side: Secondary | ICD-10-CM | POA: Diagnosis not present

## 2021-02-27 DIAGNOSIS — M47816 Spondylosis without myelopathy or radiculopathy, lumbar region: Secondary | ICD-10-CM | POA: Diagnosis not present

## 2021-02-27 DIAGNOSIS — M9903 Segmental and somatic dysfunction of lumbar region: Secondary | ICD-10-CM | POA: Diagnosis not present

## 2021-03-01 DIAGNOSIS — J449 Chronic obstructive pulmonary disease, unspecified: Secondary | ICD-10-CM | POA: Diagnosis not present

## 2021-03-01 DIAGNOSIS — M47816 Spondylosis without myelopathy or radiculopathy, lumbar region: Secondary | ICD-10-CM | POA: Diagnosis not present

## 2021-03-01 DIAGNOSIS — M9903 Segmental and somatic dysfunction of lumbar region: Secondary | ICD-10-CM | POA: Diagnosis not present

## 2021-03-01 DIAGNOSIS — M5440 Lumbago with sciatica, unspecified side: Secondary | ICD-10-CM | POA: Diagnosis not present

## 2021-03-02 DIAGNOSIS — M5459 Other low back pain: Secondary | ICD-10-CM | POA: Diagnosis not present

## 2021-03-06 ENCOUNTER — Ambulatory Visit (INDEPENDENT_AMBULATORY_CARE_PROVIDER_SITE_OTHER): Payer: Medicare HMO

## 2021-03-06 DIAGNOSIS — I6389 Other cerebral infarction: Secondary | ICD-10-CM | POA: Diagnosis not present

## 2021-03-06 DIAGNOSIS — I639 Cerebral infarction, unspecified: Secondary | ICD-10-CM

## 2021-03-07 DIAGNOSIS — M5440 Lumbago with sciatica, unspecified side: Secondary | ICD-10-CM | POA: Diagnosis not present

## 2021-03-07 DIAGNOSIS — M9903 Segmental and somatic dysfunction of lumbar region: Secondary | ICD-10-CM | POA: Diagnosis not present

## 2021-03-07 DIAGNOSIS — M47816 Spondylosis without myelopathy or radiculopathy, lumbar region: Secondary | ICD-10-CM | POA: Diagnosis not present

## 2021-03-09 DIAGNOSIS — M5440 Lumbago with sciatica, unspecified side: Secondary | ICD-10-CM | POA: Diagnosis not present

## 2021-03-09 DIAGNOSIS — M9903 Segmental and somatic dysfunction of lumbar region: Secondary | ICD-10-CM | POA: Diagnosis not present

## 2021-03-09 DIAGNOSIS — M47816 Spondylosis without myelopathy or radiculopathy, lumbar region: Secondary | ICD-10-CM | POA: Diagnosis not present

## 2021-03-14 DIAGNOSIS — M9903 Segmental and somatic dysfunction of lumbar region: Secondary | ICD-10-CM | POA: Diagnosis not present

## 2021-03-14 DIAGNOSIS — M5440 Lumbago with sciatica, unspecified side: Secondary | ICD-10-CM | POA: Diagnosis not present

## 2021-03-14 DIAGNOSIS — M47816 Spondylosis without myelopathy or radiculopathy, lumbar region: Secondary | ICD-10-CM | POA: Diagnosis not present

## 2021-03-14 NOTE — Progress Notes (Signed)
Carelink Summary Report / Loop Recorder 

## 2021-03-16 DIAGNOSIS — M9903 Segmental and somatic dysfunction of lumbar region: Secondary | ICD-10-CM | POA: Diagnosis not present

## 2021-03-16 DIAGNOSIS — M5440 Lumbago with sciatica, unspecified side: Secondary | ICD-10-CM | POA: Diagnosis not present

## 2021-03-16 DIAGNOSIS — M47816 Spondylosis without myelopathy or radiculopathy, lumbar region: Secondary | ICD-10-CM | POA: Diagnosis not present

## 2021-03-20 DIAGNOSIS — M9903 Segmental and somatic dysfunction of lumbar region: Secondary | ICD-10-CM | POA: Diagnosis not present

## 2021-03-20 DIAGNOSIS — M47816 Spondylosis without myelopathy or radiculopathy, lumbar region: Secondary | ICD-10-CM | POA: Diagnosis not present

## 2021-03-20 DIAGNOSIS — M5440 Lumbago with sciatica, unspecified side: Secondary | ICD-10-CM | POA: Diagnosis not present

## 2021-03-22 DIAGNOSIS — M47816 Spondylosis without myelopathy or radiculopathy, lumbar region: Secondary | ICD-10-CM | POA: Diagnosis not present

## 2021-03-22 DIAGNOSIS — M5440 Lumbago with sciatica, unspecified side: Secondary | ICD-10-CM | POA: Diagnosis not present

## 2021-03-22 DIAGNOSIS — M9903 Segmental and somatic dysfunction of lumbar region: Secondary | ICD-10-CM | POA: Diagnosis not present

## 2021-03-27 DIAGNOSIS — M9903 Segmental and somatic dysfunction of lumbar region: Secondary | ICD-10-CM | POA: Diagnosis not present

## 2021-03-27 DIAGNOSIS — M5440 Lumbago with sciatica, unspecified side: Secondary | ICD-10-CM | POA: Diagnosis not present

## 2021-03-27 DIAGNOSIS — M47816 Spondylosis without myelopathy or radiculopathy, lumbar region: Secondary | ICD-10-CM | POA: Diagnosis not present

## 2021-03-30 DIAGNOSIS — M9903 Segmental and somatic dysfunction of lumbar region: Secondary | ICD-10-CM | POA: Diagnosis not present

## 2021-03-30 DIAGNOSIS — M5440 Lumbago with sciatica, unspecified side: Secondary | ICD-10-CM | POA: Diagnosis not present

## 2021-03-30 DIAGNOSIS — M47816 Spondylosis without myelopathy or radiculopathy, lumbar region: Secondary | ICD-10-CM | POA: Diagnosis not present

## 2021-04-03 DIAGNOSIS — M9903 Segmental and somatic dysfunction of lumbar region: Secondary | ICD-10-CM | POA: Diagnosis not present

## 2021-04-03 DIAGNOSIS — M47816 Spondylosis without myelopathy or radiculopathy, lumbar region: Secondary | ICD-10-CM | POA: Diagnosis not present

## 2021-04-03 DIAGNOSIS — M5440 Lumbago with sciatica, unspecified side: Secondary | ICD-10-CM | POA: Diagnosis not present

## 2021-04-05 LAB — CUP PACEART REMOTE DEVICE CHECK
Date Time Interrogation Session: 20221129040405
Implantable Pulse Generator Implant Date: 20210708

## 2021-04-06 DIAGNOSIS — M47816 Spondylosis without myelopathy or radiculopathy, lumbar region: Secondary | ICD-10-CM | POA: Diagnosis not present

## 2021-04-06 DIAGNOSIS — M9903 Segmental and somatic dysfunction of lumbar region: Secondary | ICD-10-CM | POA: Diagnosis not present

## 2021-04-06 DIAGNOSIS — M5440 Lumbago with sciatica, unspecified side: Secondary | ICD-10-CM | POA: Diagnosis not present

## 2021-04-07 ENCOUNTER — Telehealth: Payer: Self-pay

## 2021-04-07 NOTE — Telephone Encounter (Signed)
Spoke with patient daughter Antonio Bowen (no DPR on file for her), patient was able to come to phone identify himself and request I speak with his daughter today.    Patient's daughter states she is concerned about patient having memory loss this morning.  She states he was not able to recall several close family members names.  Advised her that I noticed his speech sounded fuzzy, although I do not know his baseline.  Advised patient should be evaluated in ED   Linq2 results from last night reviewed, nothing in report indicates any heart arrhythmia to explain current symptoms.    Again advised daughter that patient needs eval in ED.  She could try contacting PCP but ultimately based on patient symptoms, ED visit would be best.    She then requested that patient be contacted to schedule appt with new provider since they received letter that DR. Allred is leaving.  Advised scheduler is not in office today, I will have her contact.

## 2021-04-07 NOTE — Telephone Encounter (Signed)
The patient daughter states the patient woke up with some memory loss and wants to know if anything showed up on the loop. She would like to know what to do. I let her speak with Amy, rn.

## 2021-04-10 ENCOUNTER — Ambulatory Visit (INDEPENDENT_AMBULATORY_CARE_PROVIDER_SITE_OTHER): Payer: Medicare HMO

## 2021-04-10 DIAGNOSIS — M47816 Spondylosis without myelopathy or radiculopathy, lumbar region: Secondary | ICD-10-CM | POA: Diagnosis not present

## 2021-04-10 DIAGNOSIS — I639 Cerebral infarction, unspecified: Secondary | ICD-10-CM | POA: Diagnosis not present

## 2021-04-10 DIAGNOSIS — M9903 Segmental and somatic dysfunction of lumbar region: Secondary | ICD-10-CM | POA: Diagnosis not present

## 2021-04-10 DIAGNOSIS — M5440 Lumbago with sciatica, unspecified side: Secondary | ICD-10-CM | POA: Diagnosis not present

## 2021-04-13 DIAGNOSIS — M9903 Segmental and somatic dysfunction of lumbar region: Secondary | ICD-10-CM | POA: Diagnosis not present

## 2021-04-13 DIAGNOSIS — M47816 Spondylosis without myelopathy or radiculopathy, lumbar region: Secondary | ICD-10-CM | POA: Diagnosis not present

## 2021-04-13 DIAGNOSIS — M5440 Lumbago with sciatica, unspecified side: Secondary | ICD-10-CM | POA: Diagnosis not present

## 2021-04-17 DIAGNOSIS — M5440 Lumbago with sciatica, unspecified side: Secondary | ICD-10-CM | POA: Diagnosis not present

## 2021-04-17 DIAGNOSIS — M47816 Spondylosis without myelopathy or radiculopathy, lumbar region: Secondary | ICD-10-CM | POA: Diagnosis not present

## 2021-04-17 DIAGNOSIS — M9903 Segmental and somatic dysfunction of lumbar region: Secondary | ICD-10-CM | POA: Diagnosis not present

## 2021-04-20 DIAGNOSIS — M5440 Lumbago with sciatica, unspecified side: Secondary | ICD-10-CM | POA: Diagnosis not present

## 2021-04-20 DIAGNOSIS — M47816 Spondylosis without myelopathy or radiculopathy, lumbar region: Secondary | ICD-10-CM | POA: Diagnosis not present

## 2021-04-20 DIAGNOSIS — M9903 Segmental and somatic dysfunction of lumbar region: Secondary | ICD-10-CM | POA: Diagnosis not present

## 2021-04-20 NOTE — Progress Notes (Signed)
Carelink Summary Report / Loop Recorder 

## 2021-04-26 DIAGNOSIS — M5440 Lumbago with sciatica, unspecified side: Secondary | ICD-10-CM | POA: Diagnosis not present

## 2021-04-26 DIAGNOSIS — M9903 Segmental and somatic dysfunction of lumbar region: Secondary | ICD-10-CM | POA: Diagnosis not present

## 2021-04-26 DIAGNOSIS — M47816 Spondylosis without myelopathy or radiculopathy, lumbar region: Secondary | ICD-10-CM | POA: Diagnosis not present

## 2021-05-15 ENCOUNTER — Ambulatory Visit (INDEPENDENT_AMBULATORY_CARE_PROVIDER_SITE_OTHER): Payer: Medicare HMO

## 2021-05-15 DIAGNOSIS — I639 Cerebral infarction, unspecified: Secondary | ICD-10-CM

## 2021-05-15 LAB — CUP PACEART REMOTE DEVICE CHECK
Date Time Interrogation Session: 20230115230823
Implantable Pulse Generator Implant Date: 20210708

## 2021-05-26 NOTE — Progress Notes (Signed)
Carelink Summary Report / Loop Recorder 

## 2021-06-05 ENCOUNTER — Other Ambulatory Visit: Payer: Self-pay | Admitting: *Deleted

## 2021-06-05 DIAGNOSIS — J441 Chronic obstructive pulmonary disease with (acute) exacerbation: Secondary | ICD-10-CM

## 2021-06-05 MED ORDER — BUDESONIDE-FORMOTEROL FUMARATE 160-4.5 MCG/ACT IN AERO
2.0000 | INHALATION_SPRAY | Freq: Two times a day (BID) | RESPIRATORY_TRACT | 0 refills | Status: DC
Start: 2021-06-05 — End: 2021-07-18

## 2021-06-19 ENCOUNTER — Ambulatory Visit (INDEPENDENT_AMBULATORY_CARE_PROVIDER_SITE_OTHER): Payer: Medicare HMO

## 2021-06-19 DIAGNOSIS — I639 Cerebral infarction, unspecified: Secondary | ICD-10-CM

## 2021-06-19 LAB — CUP PACEART REMOTE DEVICE CHECK
Date Time Interrogation Session: 20230217230454
Implantable Pulse Generator Implant Date: 20210708

## 2021-06-22 DIAGNOSIS — M5136 Other intervertebral disc degeneration, lumbar region: Secondary | ICD-10-CM | POA: Diagnosis not present

## 2021-06-22 DIAGNOSIS — G90529 Complex regional pain syndrome I of unspecified lower limb: Secondary | ICD-10-CM | POA: Insufficient documentation

## 2021-06-26 ENCOUNTER — Other Ambulatory Visit: Payer: Self-pay | Admitting: Gastroenterology

## 2021-06-26 NOTE — Progress Notes (Signed)
Carelink Summary Report / Loop Recorder 

## 2021-06-29 ENCOUNTER — Other Ambulatory Visit: Payer: Self-pay | Admitting: Gastroenterology

## 2021-07-06 ENCOUNTER — Ambulatory Visit (INDEPENDENT_AMBULATORY_CARE_PROVIDER_SITE_OTHER): Payer: Medicare HMO

## 2021-07-06 VITALS — Wt 197.0 lb

## 2021-07-06 DIAGNOSIS — K219 Gastro-esophageal reflux disease without esophagitis: Secondary | ICD-10-CM

## 2021-07-06 DIAGNOSIS — K222 Esophageal obstruction: Secondary | ICD-10-CM

## 2021-07-06 DIAGNOSIS — Z0001 Encounter for general adult medical examination with abnormal findings: Secondary | ICD-10-CM

## 2021-07-06 DIAGNOSIS — R1310 Dysphagia, unspecified: Secondary | ICD-10-CM

## 2021-07-06 DIAGNOSIS — G90529 Complex regional pain syndrome I of unspecified lower limb: Secondary | ICD-10-CM | POA: Diagnosis not present

## 2021-07-06 DIAGNOSIS — Z1211 Encounter for screening for malignant neoplasm of colon: Secondary | ICD-10-CM

## 2021-07-06 DIAGNOSIS — G90521 Complex regional pain syndrome I of right lower limb: Secondary | ICD-10-CM | POA: Diagnosis not present

## 2021-07-06 DIAGNOSIS — Z0000 Encounter for general adult medical examination without abnormal findings: Secondary | ICD-10-CM

## 2021-07-06 NOTE — Progress Notes (Signed)
Subjective:   Antonio Bowen is a 76 y.o. male who presents for Medicare Annual/Subsequent preventive examination.  Virtual Visit via Telephone Note  I connected with  Antonio Bowen on 07/06/21 at  3:30 PM EST by telephone and verified that I am speaking with the correct person using two identifiers.  Location: Patient: Home Provider: [facility] Persons participating in the virtual visit: patient/Nurse Health Advisor   I discussed the limitations, risks, security and privacy concerns of performing an evaluation and management service by telephone and the availability of in person appointments. The patient expressed understanding and agreed to proceed.  Interactive audio and video telecommunications were attempted between this nurse and patient, however failed, due to patient having technical difficulties OR patient did not have access to video capability.  We continued and completed visit with audio only.  Some vital signs may be absent or patient reported.   Antonio Niebla E Aleksia Freiman, LPN   Review of Systems     Cardiac Risk Factors include: advanced age (>4mn, >>32women);male gender;dyslipidemia;hypertension;sedentary lifestyle;Other (see comment), Risk factor comments: hx of CVA, O2 dependent at night     Objective:    Today's Vitals   07/06/21 1535 07/06/21 1536  Weight: 197 lb (89.4 kg)   PainSc:  3    Body mass index is 29.09 kg/m.  Advanced Directives 07/06/2021 07/05/2020 04/14/2020 11/04/2019 08/18/2019 08/02/2019 12/24/2018  Does Patient Have a Medical Advance Directive? Yes No No No No No No  Type of [place] - - - - - -  Copy of [place] Chart? No - copy requested - - - - - -  Would patient like information on creating a medical advance directive? - No - Patient declined No - Patient declined No - Patient declined Yes (MAU/Ambulatory/Procedural Areas - Information given) No - Patient declined No - Patient  declined  Pre-existing out of facility DNR order (yellow form or pink MOST form) - - - - - - -    Current Medications (verified) Outpatient Encounter Medications as of 07/06/2021  Medication Sig   acetaminophen (TYLENOL) 500 MG tablet Take 1,000 mg by mouth 2 (two) times daily as needed (for pain.).   albuterol (VENTOLIN HFA) 108 (90 Base) MCG/ACT inhaler Inhale 1-2 puffs into the lungs every 4 (four) hours as needed for wheezing or shortness of breath. INHALE TWO PUFFS INTO LUNGS EVERY 6 HOURS AS NEEDED FOR WHEEZING   budesonide-formoterol (SYMBICORT) 160-4.5 MCG/ACT inhaler Inhale 2 puffs into the lungs in the morning and at bedtime.   cetirizine (ZYRTEC) 10 MG tablet Take 10 mg by mouth at bedtime.    clopidogrel (PLAVIX) 75 MG tablet Take 1 tablet (75 mg total) by mouth daily.   fluticasone (FLONASE) 50 MCG/ACT nasal spray Place 1 spray into both nostrils 2 (two) times daily as needed for allergies or rhinitis.   Homeopathic Products (LEG CRAMP RELIEF PO) Take 2 tablets by mouth at bedtime. [facility]   levothyroxine (SYNTHROID) 125 MCG tablet Take 1 tablet (125 mcg total) by mouth daily before breakfast.   levothyroxine (SYNTHROID) 25 MCG tablet TAKE 1/2 (ONE-HALF) TABLET BY MOUTH ONCE DAILY AS DIRECTED along with 125 mg daily   losartan-hydrochlorothiazide (HYZAAR) 50-12.5 MG tablet Take 1 tablet by mouth daily.   magnesium oxide (MAG-OX) 400 MG tablet Take 400 mg by mouth at bedtime.    meloxicam (MOBIC) 15 MG tablet Take 1 tablet (15 mg total) by mouth daily.   omeprazole (  PRILOSEC) 40 MG capsule Take 1 capsule (40 mg total) by mouth in the morning and at bedtime.   PRESCRIPTION MEDICATION Oxygen 3.0 liters at night, prn use in day   SUPER B COMPLEX/C PO Take 1 capsule by mouth at bedtime.   traMADol (ULTRAM) 50 MG tablet Take 50-100 mg by mouth 4 (four) times daily as needed.   traZODone (DESYREL) 50 MG tablet Take 0.5-1 tablets (25-50 mg total) by mouth at bedtime as needed for sleep.    No facility-administered encounter medications on file as of 07/06/2021.    Allergies (verified) Nuts, Other, Vicodin [hydrocodone-acetaminophen], Codeine, Aspirin, and Penicillins   History: Past Medical History:  Diagnosis Date   Allergic rhinitis    Arthritis    BOOP (bronchiolitis obliterans with organizing pneumonia) ([place])    Bronchitis    Colon polyps    COPD (chronic obstructive pulmonary disease) ([place])    Dyspnea    with exertion    Dysrhythmia    skipped beats- followed by Dr Angelena Form    GERD (gastroesophageal reflux disease)    Hypertension    Hypothyroidism    Kidney stone    Low back pain    Oxygen dependent    uses 3L/[place] at nite occasional during day if needed    Stroke [facility])    hx of 2 strokes which patient did not know he had had    Thyroid disease    Tobacco dependence    Past Surgical History:  Procedure Laterality Date   BIOPSY  08/18/2019   Procedure: BIOPSY;  Surgeon: Irene Shipper, MD;  Location: [facility];  Service: Endoscopy;;   broncoscopy      carpel tunnal     left   COLONOSCOPY     x 4   DUPUYTREN CONTRACTURE RELEASE     Left   ESOPHAGOGASTRODUODENOSCOPY (EGD) WITH PROPOFOL N/A 03/26/2016   Procedure: ESOPHAGOGASTRODUODENOSCOPY (EGD) WITH PROPOFOL;  Surgeon: Irene Shipper, MD;  Location: [facility];  Service: Endoscopy;  Laterality: N/A;   ESOPHAGOGASTRODUODENOSCOPY (EGD) WITH PROPOFOL N/A 08/18/2019   Procedure: ESOPHAGOGASTRODUODENOSCOPY (EGD) WITH PROPOFOL;  Surgeon: Irene Shipper, MD;  Location: [facility];  Service: Endoscopy;  Laterality: N/A;  Possible dilation   ESOPHAGOGASTRODUODENOSCOPY ENDOSCOPY     x 3   FACIAL FRACTURE SURGERY     LOOP RECORDER INSERTION N/A 11/05/2019   Procedure: LOOP RECORDER INSERTION;  Surgeon: Thompson Grayer, MD;  Location: [facility];  Service: Cardiovascular;  Laterality: N/A;   OTHER SURGICAL HISTORY     facial surgery   SAVORY DILATION N/A 08/18/2019   Procedure: SAVORY DILATION;   Surgeon: Irene Shipper, MD;  Location: [facility];  Service: Endoscopy;  Laterality: N/A;   TOTAL HIP ARTHROPLASTY  01/2011   Right hip, [place]   ULNAR NERVE TRANSPOSITION     left   Family History  Problem Relation Age of Onset   Arthritis Father    Stroke Father 32   Prostate cancer Maternal Grandfather    Stomach cancer Maternal Grandmother    Cancer Paternal Grandmother    Colon cancer Other        mat 1st cousin   Cancer Mother    Healthy Daughter    Healthy Son    Pancreatic cancer Neg Hx    Esophageal cancer Neg Hx    Social History   Socioeconomic History   Marital status: Married    Spouse name: [name]   Number of children: 2   Years of  education: 11   Highest education level: 11th grade  Occupational History   Occupation: Retired    Comment: [employer], 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, also worked in [employer]  Tobacco Use   Smoking status: Former    Packs/day: 1.00    Years: 50.00    Pack years: 50.00    Types: Cigarettes    Quit date: 07/30/2010    Years since quitting: 10.9   Smokeless tobacco: Current    Types: Snuff  Vaping Use   Vaping Use: Never used  Substance and Sexual Activity   Alcohol use: Yes    Comment: rare   Drug use: No   Sexual activity: Not on file  Other Topics Concern   Not on file  Social History Narrative   Not on file   Social Determinants of Health   Financial Resource Strain: Low Risk    Difficulty of Paying Living Expenses: Not hard at all  Food Insecurity: No Food Insecurity   Worried About Charity fundraiser in the Last Year: Never true   [place] in the Last Year: Never true  Transportation Needs: No Transportation Needs   Lack of Transportation (Medical): No   Lack of Transportation (Non-Medical): No  Physical Activity: Insufficiently Active   Days of Exercise per Week: 7 days   Minutes of Exercise per Session: 10 min  Stress: No Stress Concern Present   Feeling of Stress : Only a little  Social  Connections: Moderately Isolated   Frequency of Communication with Friends and Family: More than three times a week   Frequency of Social Gatherings with Friends and Family: More than three times a week   Attends Religious Services: Never   Marine scientist or Organizations: No   Attends Music therapist: Never   Marital Status: Married    Tobacco Counseling Ready to quit: Not Answered Counseling given: Not Answered   Clinical Intake:  Pre-visit preparation completed: Yes  Pain : 0-10 Pain Score: 3  Pain Type: Chronic pain Pain Location: Back Pain Descriptors / Indicators: Aching, Discomfort, Tender, Sore Pain Onset: More than a month ago Pain Frequency: Intermittent     BMI - recorded: 29.09 Nutritional Status: BMI 25 -29 Overweight Nutritional Risks: Nausea/ vomitting/ diarrhea (hurt stomach after spinal injection today) Diabetes: No  How often do you need to have someone help you when you read instructions, pamphlets, or other written materials from your doctor or pharmacy?: 1 - Never  Diabetic? no  Interpreter Needed?: No  Information entered by :: Ly Bacchi, LPN   Activities of Daily Living In your present state of health, do you have any difficulty performing the following activities: 07/06/2021  Hearing? Y  Vision? N  Difficulty concentrating or making decisions? N  Walking or climbing stairs? Y  Comment hurts back and leg  Dressing or bathing? N  Doing errands, shopping? N  Preparing Food and eating ? N  Using the Toilet? N  In the past six months, have you accidently leaked urine? Y  Comment urgency  Do you have problems with loss of bowel control? N  Managing your Medications? N  Managing your Finances? N  Housekeeping or managing your Housekeeping? Y  Some recent data might be hidden    Patient Care Team: [name], [name], MD as PCP - General (Family Medicine) Ilean China, RN as Registered Nurse  Indicate any  recent Medical Services you may have received from other than [facility]  providers in the past year (date may be approximate).     Assessment:   This is a routine wellness examination for [place].  Hearing/Vision screen Hearing Screening - Comments:: C/o moderate hearing difficulties - declines hearing aids Vision Screening - Comments:: Wears otc reading glasses prn only - behind with routine eye exams - no eye doctor  Dietary issues and exercise activities discussed: Current Exercise Habits: The patient does not participate in regular exercise at present, Type of exercise: walking, Time (Minutes): 10, Frequency (Times/Week): 7, Weekly Exercise (Minutes/Week): 70, Intensity: Mild, Exercise limited by: orthopedic condition(s)   Goals Addressed               This Visit's Progress     "I would like to be able to travel some" (pt-stated)   Not on track     Current Barriers:  Chronic Disease Management support and education needs related to COPD and oxygen use  Nurse Case Manager Clinical Goal(s):  Over the next 30 days, patient will work with [facility] to address needs related to oxygen use Over the next 60 days, patient will have a plan in place for oxygen use while travelling   Interventions:  Evaluation of current treatment plan related to COPD and patient's adherence to plan as established by provider. RNCM consulted with [facility] [phone] and they advised that they do not carry any continuous flow portable concentrators.  Reached out to [place] [phone])  They can special order a continuous flow portable concentrator that costs $1820. Patient would pay half up front and half when it arrives Insurance will not cover purchase price, only rental and they only stock pulse flow for rental. Require a prescription  Patient Self Care Activities:  Performs ADL's independently Some difficulty with IADLs due to Covid-19 mask requirements  Please see past updates  related to this goal by clicking on the "Past Updates" button in the selected goal        Exercise 150 min/wk Moderate Activity   Not on track     Maintain current activity level. Increase as tolerated once your joints and leg are feeling better      Obtain Annual Eye (retinal)  Exam    Not on track     Prevent falls   Not on track      Depression Screen PHQ 2/9 Scores 07/06/2021 02/13/2021 10/20/2020 08/11/2020 07/05/2020 06/17/2020 05/19/2020  PHQ - 2 Score 0 0 0 0 0 0 0  PHQ- 9 Score - 2 - - - - -    Fall Risk Fall Risk  07/06/2021 02/13/2021 10/20/2020 08/11/2020 07/05/2020  Falls in the past year? 1 0 0 0 0  Comment - - - - -  Number falls in past yr: 1 - - - -  Injury with Fall? 0 - - - -  Comment - - - - -  Risk Factor Category  - - - - -  Risk for fall due to : History of fall(s);Orthopedic patient - - - -  Follow up Falls prevention discussed;Education provided - - - Falls evaluation completed    FALL RISK PREVENTION PERTAINING TO THE HOME:  Any stairs in or around the home? No  If so, are there any without handrails? No  Home free of loose throw rugs in walkways, pet beds, electrical cords, etc? Yes  Adequate lighting in your home to reduce risk of falls? Yes   ASSISTIVE DEVICES UTILIZED TO PREVENT FALLS:  Life alert? No  Use  of a cane, walker or w/c? No  Grab bars in the bathroom? Yes  Shower chair or bench in shower? Yes  Elevated toilet seat or a handicapped toilet? Yes   TIMED UP AND GO:  Was the test performed? No . Telephonic visit  Cognitive Function: Normal cognitive status assessed by direct observation by this Nurse Health Advisor. No abnormalities found.    MMSE - Mini Mental State Exam 08/13/2017  Orientation to time 5  Orientation to Place 5  Registration 3  Attention/ Calculation 5  Recall 3  Language- name 2 objects 2  Language- repeat 1  Language- follow 3 step command 3  Language- read & follow direction 1  Write a sentence 1  Copy design 0   Total score 29     6CIT Screen 07/05/2020 12/24/2018  What Year? 0 points 0 points  What month? 0 points 0 points  What time? 0 points 0 points  Count back from 20 0 points 0 points  Months in reverse 0 points 0 points  Repeat phrase 0 points 0 points  Total Score 0 0    Immunizations Immunization History  Administered Date(s) Administered   Fluad Quad(high Dose 65+) 02/13/2021   Influenza Split 01/24/2012   Influenza, High Dose Seasonal PF 01/30/2018, 01/30/2018   Influenza,inj,Quad PF,6+ Mos 03/09/2013, 01/14/2014, 02/21/2015, 02/09/2016, 03/07/2017   Influenza-Unspecified 02/17/2020   Moderna Sars-Covid-2 Vaccination 06/15/2019, 07/13/2019, 02/24/2020   Pneumococcal Conjugate-13 01/14/2014   Pneumococcal Polysaccharide-23 01/24/2012   Td 07/03/2006   Tdap 07/03/2006, 01/05/2011   Zoster, Live 01/05/2011    TDAP status: Due, Education has been provided regarding the importance of this vaccine. Advised may receive this vaccine at local pharmacy or Health Dept. Aware to provide a copy of the vaccination record if obtained from local pharmacy or Health Dept. Verbalized acceptance and understanding.  Flu Vaccine status: Up to date  Pneumococcal vaccine status: Up to date  Covid-19 vaccine status: Completed vaccines  Qualifies for Shingles Vaccine? Yes   Zostavax completed Yes   Shingrix Completed?: No.    Education has been provided regarding the importance of this vaccine. Patient has been advised to call insurance company to determine out of pocket expense if they have not yet received this vaccine. Advised may also receive vaccine at local pharmacy or Health Dept. Verbalized acceptance and understanding.  Screening Tests Health Maintenance  Topic Date Due   Zoster Vaccines- Shingrix (1 of 2) Never done   COLONOSCOPY (Pts 45-43yr Insurance coverage will need to be confirmed)  07/09/2017   COVID-19 Vaccine (4 - Booster for Moderna series) 04/20/2020   TETANUS/TDAP   01/04/2021   Pneumonia Vaccine 76 Years old  Completed   INFLUENZA VACCINE  Completed   Hepatitis C Screening  Completed   HPV VACCINES  Aged Out    Health Maintenance  Health Maintenance Due  Topic Date Due   Zoster Vaccines- Shingrix (1 of 2) Never done   COLONOSCOPY (Pts 45-473yrInsurance coverage will need to be confirmed)  07/09/2017   COVID-19 Vaccine (4 - Booster for Moderna series) 04/20/2020   TETANUS/TDAP  01/04/2021    Colorectal cancer screening: Type of screening: Colonoscopy. Completed 07/09/2012. Repeat every 5 years  Lung Cancer Screening: (Low Dose CT Chest recommended if Age 76-80ears, 30 pack-year currently smoking OR have quit w/in 15years.) does not qualify  Additional Screening:  Hepatitis C Screening: does qualify; Completed 03/07/2017  Vision Screening: Recommended annual ophthalmology exams for early detection of glaucoma and other disorders  of the eye. Is the patient up to date with their annual eye exam?  No  Who is the provider or what is the name of the office in which the patient attends annual eye exams? none If pt is not established with a provider, would they like to be referred to a provider to establish care? No .   Dental Screening: Recommended annual dental exams for proper oral hygiene  Community Resource Referral / Chronic Care Management: CRR required this visit?  No   CCM required this visit?  No      Plan:     I have personally reviewed and noted the following in the patients chart:   Medical and social history Use of alcohol, tobacco or illicit drugs  Current medications and supplements including opioid prescriptions. Patient is currently taking opioid prescriptions. Information provided to patient regarding non-opioid alternatives. Patient advised to discuss non-opioid treatment plan with their provider. Functional ability and status Nutritional status Physical activity Advanced directives List of other  physicians Hospitalizations, surgeries, and ER visits in previous 12 months Vitals Screenings to include cognitive, depression, and falls Referrals and appointments  In addition, I have reviewed and discussed with patient certain preventive protocols, quality metrics, and best practice recommendations. A written personalized care plan for preventive services as well as general preventive health recommendations were provided to patient.     Sandrea Hammond, LPN   09/29/5636   Nurse Notes: He hasn't been able to travel or stay away from home at all for many years because he doesn't have portable oxygen - sent message

## 2021-07-06 NOTE — Patient Instructions (Signed)
Antonio Bowen , Thank you for taking time to come for your Medicare Wellness Visit. I appreciate your ongoing commitment to your health goals. Please review the following plan we discussed and let me know if I can assist you in the future.   Screening recommendations/referrals: Colonoscopy: Done 07/09/2012 - Repeat in 5 years *past due - sent message to Dr Henrene Pastor Recommended yearly ophthalmology/optometry visit for glaucoma screening and checkup Recommended yearly dental visit for hygiene and checkup  Vaccinations: Influenza vaccine: Done 02/13/2021 - Repeat annually Pneumococcal vaccine: Done 01/24/2012 & 01/14/2014 Tdap vaccine: Done 01/05/2011 - Repeat in 10 years *due now Shingles vaccine: Zostavax done 2012 - due for Shingrix - 2 doses 2-6 months apart - over 90% effective   Covid-19: Done 06/15/19, 07/13/19, 02/24/2020 - contact pharmacy for additional boosters  Advanced directives: Please bring a copy of your health care power of attorney and living will to the office to be added to your chart at your convenience.   Conditions/risks identified: Aim for 30 minutes of exercise, 6-8 glasses of water, and 5 servings of fruits and vegetables each day. You may need to come into the office for additional oxygen testing before insurance will cover portable oxygen concentrator.  Next appointment: Follow up in one year for your annual wellness visit.   Preventive Care 38 Years and Older, Male  Preventive care refers to lifestyle choices and visits with your health care provider that can promote health and wellness. What does preventive care include? A yearly physical exam. This is also called an annual well check. Dental exams once or twice a year. Routine eye exams. Ask your health care provider how often you should have your eyes checked. Personal lifestyle choices, including: Daily care of your teeth and gums. Regular physical activity. Eating a healthy diet. Avoiding tobacco and drug  use. Limiting alcohol use. Practicing safe sex. Taking low doses of aspirin every day. Taking vitamin and mineral supplements as recommended by your health care provider. What happens during an annual well check? The services and screenings done by your health care provider during your annual well check will depend on your age, overall health, lifestyle risk factors, and family history of disease. Counseling  Your health care provider may ask you questions about your: Alcohol use. Tobacco use. Drug use. Emotional well-being. Home and relationship well-being. Sexual activity. Eating habits. History of falls. Memory and ability to understand (cognition). Work and work Statistician. Screening  You may have the following tests or measurements: Height, weight, and BMI. Blood pressure. Lipid and cholesterol levels. These may be checked every 5 years, or more frequently if you are over 15 years old. Skin check. Lung cancer screening. You may have this screening every year starting at age 27 if you have a 30-pack-year history of smoking and currently smoke or have quit within the past 15 years. Fecal occult blood test (FOBT) of the stool. You may have this test every year starting at age 45. Flexible sigmoidoscopy or colonoscopy. You may have a sigmoidoscopy every 5 years or a colonoscopy every 10 years starting at age 30. Prostate cancer screening. Recommendations will vary depending on your family history and other risks. Hepatitis C blood test. Hepatitis B blood test. Sexually transmitted disease (STD) testing. Diabetes screening. This is done by checking your blood sugar (glucose) after you have not eaten for a while (fasting). You may have this done every 1-3 years. Abdominal aortic aneurysm (AAA) screening. You may need this if you are a current  or former smoker. Osteoporosis. You may be screened starting at age 59 if you are at high risk. Talk with your health care provider about  your test results, treatment options, and if necessary, the need for more tests. Vaccines  Your health care provider may recommend certain vaccines, such as: Influenza vaccine. This is recommended every year. Tetanus, diphtheria, and acellular pertussis (Tdap, Td) vaccine. You may need a Td booster every 10 years. Zoster vaccine. You may need this after age 49. Pneumococcal 13-valent conjugate (PCV13) vaccine. One dose is recommended after age 31. Pneumococcal polysaccharide (PPSV23) vaccine. One dose is recommended after age 37. Talk to your health care provider about which screenings and vaccines you need and how often you need them. This information is not intended to replace advice given to you by your health care provider. Make sure you discuss any questions you have with your health care provider. Document Released: 05/13/2015 Document Revised: 01/04/2016 Document Reviewed: 02/15/2015 Elsevier Interactive Patient Education  2017 [employer] Prevention in the Home Falls can cause injuries. They can happen to people of all ages. There are many things you can do to make your home safe and to help prevent falls. What can I do on the outside of my home? Regularly fix the edges of walkways and driveways and fix any cracks. Remove anything that might make you trip as you walk through a door, such as a raised step or threshold. Trim any bushes or trees on the path to your home. Use bright outdoor lighting. Clear any walking paths of anything that might make someone trip, such as rocks or tools. Regularly check to see if handrails are loose or broken. Make sure that both sides of any steps have handrails. Any raised decks and porches should have guardrails on the edges. Have any leaves, snow, or ice cleared regularly. Use sand or salt on walking paths during winter. Clean up any spills in your garage right away. This includes oil or grease spills. What can I do in the bathroom? Use  night lights. Install grab bars by the toilet and in the tub and shower. Do not use towel bars as grab bars. Use non-skid mats or decals in the tub or shower. If you need to sit down in the shower, use a plastic, non-slip stool. Keep the floor dry. Clean up any water that spills on the floor as soon as it happens. Remove soap buildup in the tub or shower regularly. Attach bath mats securely with double-sided non-slip rug tape. Do not have throw rugs and other things on the floor that can make you trip. What can I do in the bedroom? Use night lights. Make sure that you have a light by your bed that is easy to reach. Do not use any sheets or blankets that are too big for your bed. They should not hang down onto the floor. Have a firm chair that has side arms. You can use this for support while you get dressed. Do not have throw rugs and other things on the floor that can make you trip. What can I do in the kitchen? Clean up any spills right away. Avoid walking on wet floors. Keep items that you use a lot in easy-to-reach places. If you need to reach something above you, use a strong step stool that has a grab bar. Keep electrical cords out of the way. Do not use floor polish or wax that makes floors slippery. If you must use wax,  use non-skid floor wax. Do not have throw rugs and other things on the floor that can make you trip. What can I do with my stairs? Do not leave any items on the stairs. Make sure that there are handrails on both sides of the stairs and use them. Fix handrails that are broken or loose. Make sure that handrails are as long as the stairways. Check any carpeting to make sure that it is firmly attached to the stairs. Fix any carpet that is loose or worn. Avoid having throw rugs at the top or bottom of the stairs. If you do have throw rugs, attach them to the floor with carpet tape. Make sure that you have a light switch at the top of the stairs and the bottom of the  stairs. If you do not have them, ask someone to add them for you. What else can I do to help prevent falls? Wear shoes that: Do not have high heels. Have rubber bottoms. Are comfortable and fit you well. Are closed at the toe. Do not wear sandals. If you use a stepladder: Make sure that it is fully opened. Do not climb a closed stepladder. Make sure that both sides of the stepladder are locked into place. Ask someone to hold it for you, if possible. Clearly mark and make sure that you can see: Any grab bars or handrails. First and last steps. Where the edge of each step is. Use tools that help you move around (mobility aids) if they are needed. These include: Canes. Walkers. Scooters. Crutches. Turn on the lights when you go into a dark area. Replace any light bulbs as soon as they burn out. Set up your furniture so you have a clear path. Avoid moving your furniture around. If any of your floors are uneven, fix them. If there are any pets around you, be aware of where they are. Review your medicines with your doctor. Some medicines can make you feel dizzy. This can increase your chance of falling. Ask your doctor what other things that you can do to help prevent falls. This information is not intended to replace advice given to you by your health care provider. Make sure you discuss any questions you have with your health care provider. Document Released: 02/10/2009 Document Revised: 09/22/2015 Document Reviewed: 05/21/2014 Elsevier Interactive Patient Education  2017 [employer].

## 2021-07-17 ENCOUNTER — Other Ambulatory Visit: Payer: Self-pay | Admitting: Family Medicine

## 2021-07-17 DIAGNOSIS — J441 Chronic obstructive pulmonary disease with (acute) exacerbation: Secondary | ICD-10-CM

## 2021-07-24 ENCOUNTER — Ambulatory Visit (INDEPENDENT_AMBULATORY_CARE_PROVIDER_SITE_OTHER): Payer: Medicare HMO

## 2021-07-24 DIAGNOSIS — I639 Cerebral infarction, unspecified: Secondary | ICD-10-CM | POA: Diagnosis not present

## 2021-07-25 LAB — CUP PACEART REMOTE DEVICE CHECK
Date Time Interrogation Session: 20230326231356
Implantable Pulse Generator Implant Date: 20210708

## 2021-08-03 NOTE — Progress Notes (Signed)
Carelink Summary Report / Loop Recorder 

## 2021-08-14 ENCOUNTER — Telehealth: Payer: Self-pay

## 2021-08-14 ENCOUNTER — Encounter: Payer: Self-pay | Admitting: Family Medicine

## 2021-08-14 ENCOUNTER — Ambulatory Visit (INDEPENDENT_AMBULATORY_CARE_PROVIDER_SITE_OTHER): Payer: Medicare HMO | Admitting: Family Medicine

## 2021-08-14 VITALS — BP 145/84 | HR 81 | Ht 69.0 in | Wt 198.0 lb

## 2021-08-14 DIAGNOSIS — N1832 Chronic kidney disease, stage 3b: Secondary | ICD-10-CM | POA: Diagnosis not present

## 2021-08-14 DIAGNOSIS — Z125 Encounter for screening for malignant neoplasm of prostate: Secondary | ICD-10-CM

## 2021-08-14 DIAGNOSIS — I10 Essential (primary) hypertension: Secondary | ICD-10-CM | POA: Diagnosis not present

## 2021-08-14 DIAGNOSIS — E785 Hyperlipidemia, unspecified: Secondary | ICD-10-CM | POA: Diagnosis not present

## 2021-08-14 DIAGNOSIS — J441 Chronic obstructive pulmonary disease with (acute) exacerbation: Secondary | ICD-10-CM

## 2021-08-14 DIAGNOSIS — Z23 Encounter for immunization: Secondary | ICD-10-CM

## 2021-08-14 DIAGNOSIS — E039 Hypothyroidism, unspecified: Secondary | ICD-10-CM

## 2021-08-14 DIAGNOSIS — M1712 Unilateral primary osteoarthritis, left knee: Secondary | ICD-10-CM

## 2021-08-14 MED ORDER — METHYLPREDNISOLONE ACETATE 80 MG/ML IJ SUSP
80.0000 mg | Freq: Once | INTRAMUSCULAR | Status: AC
  Administered 2021-08-14: 80 mg via INTRAMUSCULAR

## 2021-08-14 MED ORDER — BUDESONIDE-FORMOTEROL FUMARATE 160-4.5 MCG/ACT IN AERO: 2.0000 | INHALATION_SPRAY | Freq: Two times a day (BID) | RESPIRATORY_TRACT | 3 refills | Status: DC

## 2021-08-14 NOTE — Telephone Encounter (Signed)
Antonio Bowen, ? ?Have you heard anything about a COPD program for Symbicort? This p states that he could have a $0 copay for the medication through them. He normally pays $135 for copay. ?

## 2021-08-14 NOTE — Progress Notes (Signed)
? ?BP (!) 145/84   Pulse 81   Ht 5' 9" (1.753 m)   Wt 198 lb (89.8 kg)   SpO2 97%   BMI 29.24 kg/m?   ? ?Subjective:  ? ?Patient ID: Antonio Bowen, male    DOB: [birth year], 76 y.o.   MRN: [id number] ? ?HPI: ?[name] is a 76 y.o. male presenting on 08/14/2021 for Medical Management of Chronic Issues, Hypothyroidism, COPD, and Knee Pain ? ? ?HPI ?Hypothyroidism recheck ?Patient is coming in for thyroid recheck today as well. They deny any issues with hair changes or heat or cold problems or diarrhea or constipation. They deny any chest pain or palpitations. They are currently on levothyroxine 137 micrograms  ? ?COPD ?Patient is coming in for COPD recheck today.  He is currently on albuterol and Symbicort.  He has a mild chronic cough but denies any major coughing spells or wheezing spells.  He has 0nighttime symptoms per week and 0daytime symptoms per week currently.  ? ?Hyperlipidemia ?Patient is coming in for recheck of his hyperlipidemia. The patient is currently taking none currently, has been intolerant in the past of statins. They deny any issues with myalgias or history of liver damage from it. They deny any focal numbness or weakness or chest pain.  ? ?Hypertension and CKD 3 ?Patient is currently on losartan hydrochlorothiazide, and their blood pressure today is 145/84. Patient denies any lightheadedness or dizziness. Patient denies headaches, blurred vision, chest pains, shortness of breath, or weakness. Denies any side effects from medication and is content with current medication.  ? ?Left knee osteoarthritis ?Patient is coming in for recheck for left knee osteoarthritis.  He says his knee is still been bothering him and he had an injection last time he was here and it did help.  He would like to do another injection.  He denies anything changes but just still bothering him the same and it flares up sometimes. ? ?Relevant past medical, surgical, family and social history reviewed and updated  as indicated. Interim medical history since our last visit reviewed. ?Allergies and medications reviewed and updated. ? ?Review of Systems  ?Constitutional:  Negative for chills and fever.  ?Respiratory:  Negative for shortness of breath and wheezing.   ?Cardiovascular:  Negative for chest pain and leg swelling.  ?Musculoskeletal:  Positive for arthralgias and back pain. Negative for gait problem and joint swelling.  ?Skin:  Negative for color change and rash.  ?Neurological:  Negative for dizziness, weakness, light-headedness and headaches.  ?All other systems reviewed and are negative. ? ?Per HPI unless specifically indicated above ? ? ?Allergies as of 08/14/2021   ? ?   Reactions  ? Nuts Nausea And Vomiting  ? sweating  ? Other Nausea And Vomiting  ? Per patient, cannot take any narcotic. Nausea and vomiting ?sweating  ? Vicodin [hydrocodone-acetaminophen]   ? tachycardia  ? Codeine   ? Aspirin Other (See Comments)  ? Upset stomach, bleeding  ? Penicillins Hives, Rash  ? Has patient had a PCN reaction causing immediate rash, facial/tongue/throat swelling, SOB or lightheadedness with hypotension:No ?Has patient had a PCN reaction causing severe rash involving mucus membranes or skin necrosis:No ?Has patient had a PCN reaction that required hospitalization:No ?Has patient had a PCN reaction occurring within the last 10 years:Yes ?If all of the above answers are "NO", then may proceed with Cephalosporin use.  ? ?  ? ?  ?Medication List  ?  ? ?  ? Accurate as  of August 14, 2021  4:37 PM. If you have any questions, ask your nurse or doctor.  ?  ?  ? ?  ? ?acetaminophen 500 MG tablet ?Commonly known as: TYLENOL ?Take 1,000 mg by mouth 2 (two) times daily as needed (for pain.). ?  ?albuterol 108 (90 Base) MCG/ACT inhaler ?Commonly known as: Ventolin HFA ?Inhale 1-2 puffs into the lungs every 4 (four) hours as needed for wheezing or shortness of breath. INHALE TWO PUFFS INTO LUNGS EVERY 6 HOURS AS NEEDED FOR WHEEZING ?   ?budesonide-formoterol 160-4.5 MCG/ACT inhaler ?Commonly known as: Symbicort ?Inhale 2 puffs into the lungs 2 (two) times daily. in the morning and at bedtime. ?  ?cetirizine 10 MG tablet ?Commonly known as: ZYRTEC ?Take 10 mg by mouth at bedtime. ?  ?clopidogrel 75 MG tablet ?Commonly known as: PLAVIX ?Take 1 tablet (75 mg total) by mouth daily. ?  ?fluticasone 50 MCG/ACT nasal spray ?Commonly known as: FLONASE ?Place 1 spray into both nostrils 2 (two) times daily as needed for allergies or rhinitis. ?  ?LEG CRAMP RELIEF PO ?Take 2 tablets by mouth at bedtime. [employer] ?  ?levothyroxine 125 MCG tablet ?Commonly known as: SYNTHROID ?Take 1 tablet (125 mcg total) by mouth daily before breakfast. ?  ?levothyroxine 25 MCG tablet ?Commonly known as: SYNTHROID ?TAKE 1/2 (ONE-HALF) TABLET BY MOUTH ONCE DAILY AS DIRECTED along with 125 mg daily ?  ?losartan-hydrochlorothiazide 50-12.5 MG tablet ?Commonly known as: HYZAAR ?Take 1 tablet by mouth daily. ?  ?magnesium oxide 400 MG tablet ?Commonly known as: MAG-OX ?Take 400 mg by mouth at bedtime. ?  ?meloxicam 15 MG tablet ?Commonly known as: MOBIC ?Take 1 tablet (15 mg total) by mouth daily. ?  ?omeprazole 40 MG capsule ?Commonly known as: PRILOSEC ?Take 1 capsule (40 mg total) by mouth in the morning and at bedtime. ?  ?PRESCRIPTION MEDICATION ?Oxygen 3.0 liters at night, prn use in day ?  ?SUPER B COMPLEX/C PO ?Take 1 capsule by mouth at bedtime. ?  ?traMADol 50 MG tablet ?Commonly known as: ULTRAM ?Take 50-100 mg by mouth 4 (four) times daily as needed. ?  ?traZODone 50 MG tablet ?Commonly known as: DESYREL ?Take 0.5-1 tablets (25-50 mg total) by mouth at bedtime as needed for sleep. ?  ? ?  ? ? ? ?Objective:  ? ?BP (!) 145/84   Pulse 81   Ht 5' 9" (1.753 m)   Wt 198 lb (89.8 kg)   SpO2 97%   BMI 29.24 kg/m?   ?Wt Readings from Last 3 Encounters:  ?08/14/21 198 lb (89.8 kg)  ?07/06/21 197 lb (89.4 kg)  ?02/13/21 197 lb (89.4 kg)  ?  ?Physical Exam ?Vitals and  nursing note reviewed.  ?Constitutional:   ?   General: He is not in acute distress. ?   Appearance: He is well-developed. He is not diaphoretic.  ?Eyes:  ?   General: No scleral icterus. ?   Conjunctiva/sclera: Conjunctivae normal.  ?Neck:  ?   Thyroid: No thyromegaly.  ?Cardiovascular:  ?   Rate and Rhythm: Normal rate and regular rhythm.  ?   Heart sounds: Normal heart sounds. No murmur heard. ?Pulmonary:  ?   Effort: Pulmonary effort is normal. No respiratory distress.  ?   Breath sounds: Normal breath sounds. No wheezing.  ?Musculoskeletal:     ?   General: No swelling. Normal range of motion.  ?   Cervical back: Neck supple.  ?Lymphadenopathy:  ?   Cervical: No cervical adenopathy.  ?  Skin: ?   General: Skin is warm and dry.  ?   Findings: No rash.  ?Neurological:  ?   Mental Status: He is alert and oriented to person, place, and time.  ?   Coordination: Coordination normal.  ?Psychiatric:     ?   Behavior: Behavior normal.  ? ? ?Knee injection: Consent form signed. Risk factors of bleeding and infection discussed with patient and patient is agreeable towards injection. Patient prepped with Betadine. Lateral approach towards injection used. Injected 80 mg of Depo-Medrol and 1 mL of 2% lidocaine. Patient tolerated procedure well and no side effects from noted. Minimal to no bleeding. Simple bandage applied after.  ? ?Assessment & Plan:  ? ?Problem List Items Addressed This Visit   ? ?  ? Cardiovascular and Mediastinum  ? Hypertension  ? Relevant Orders  ? CBC with Differential/Platelet  ? CMP14+EGFR  ?  ? Respiratory  ? Obstructive chronic bronchitis with exacerbation Gold B Copd  ? Relevant Medications  ? budesonide-formoterol (SYMBICORT) 160-4.5 MCG/ACT inhaler  ?  ? Endocrine  ? Hypothyroidism - Primary (Chronic)  ? Relevant Orders  ? TSH  ?  ? Genitourinary  ? Chronic kidney disease (CKD), stage III (moderate) (HCC)  ? Relevant Orders  ? CBC with Differential/Platelet  ? CMP14+EGFR  ?  ? Other  ?  Hyperlipidemia LDL goal <130  ? Relevant Orders  ? Lipid panel  ? ?Other Visit Diagnoses   ? ? Need for shingles vaccine      ? Relevant Orders  ? Varicella-zoster vaccine IM (Shingrix) (Completed)  ? Prostate cancer screening

## 2021-08-15 LAB — CMP14+EGFR
ALT: 24 IU/L (ref 0–44)
AST: 21 IU/L (ref 0–40)
Albumin/Globulin Ratio: 2 (ref 1.2–2.2)
Albumin: 4.4 g/dL (ref 3.7–4.7)
Alkaline Phosphatase: 88 IU/L (ref 44–121)
BUN/Creatinine Ratio: 11 (ref 10–24)
BUN: 21 mg/dL (ref 8–27)
Bilirubin Total: 0.4 mg/dL (ref 0.0–1.2)
CO2: 21 mmol/L (ref 20–29)
Calcium: 9.2 mg/dL (ref 8.6–10.2)
Chloride: 106 mmol/L (ref 96–106)
Creatinine, Ser: 1.96 mg/dL — ABNORMAL HIGH (ref 0.76–1.27)
Globulin, Total: 2.2 g/dL (ref 1.5–4.5)
Glucose: 95 mg/dL (ref 70–99)
Potassium: 4.9 mmol/L (ref 3.5–5.2)
Sodium: 141 mmol/L (ref 134–144)
Total Protein: 6.6 g/dL (ref 6.0–8.5)
eGFR: 35 mL/min/{1.73_m2} — ABNORMAL LOW (ref >59)

## 2021-08-15 LAB — CBC WITH DIFFERENTIAL/PLATELET
Basophils Absolute: 0.1 10*3/uL (ref 0.0–0.2)
Basos: 1 %
EOS (ABSOLUTE): 0.1 10*3/uL (ref 0.0–0.4)
Eos: 1 %
Hematocrit: 48.2 % (ref 37.5–51.0)
Hemoglobin: 16.3 g/dL (ref 13.0–17.7)
Immature Grans (Abs): 0.1 10*3/uL (ref 0.0–0.1)
Immature Granulocytes: 1 %
Lymphocytes Absolute: 2.7 10*3/uL (ref 0.7–3.1)
Lymphs: 28 %
MCH: 30.9 pg (ref 26.6–33.0)
MCHC: 33.8 g/dL (ref 31.5–35.7)
MCV: 91 fL (ref 79–97)
Monocytes Absolute: 0.7 10*3/uL (ref 0.1–0.9)
Monocytes: 7 %
Neutrophils Absolute: 6.3 10*3/uL (ref 1.4–7.0)
Neutrophils: 62 %
Platelets: 203 10*3/uL (ref 150–450)
RBC: 5.28 x10E6/uL (ref 4.14–5.80)
RDW: 12.7 % (ref 11.6–15.4)
WBC: 9.9 10*3/uL (ref 3.4–10.8)

## 2021-08-15 LAB — LIPID PANEL
Chol/HDL Ratio: 2 ratio (ref 0.0–5.0)
Cholesterol, Total: 97 mg/dL — ABNORMAL LOW (ref 100–199)
HDL: 48 mg/dL (ref >39)
LDL Chol Calc (NIH): 32 mg/dL (ref 0–99)
Triglycerides: 88 mg/dL (ref 0–149)
VLDL Cholesterol Cal: 17 mg/dL (ref 5–40)

## 2021-08-15 LAB — TSH: TSH: 1.33 u[IU]/mL (ref 0.450–4.500)

## 2021-08-15 LAB — PSA, TOTAL AND FREE
PSA, Free Pct: 35.8 %
PSA, Free: 0.68 ng/mL
Prostate Specific Ag, Serum: 1.9 ng/mL (ref 0.0–4.0)

## 2021-08-17 ENCOUNTER — Telehealth: Payer: Self-pay

## 2021-08-17 NOTE — Chronic Care Management (AMB) (Signed)
?  Chronic Care Management  ? ?Note ? ?08/17/2021 ?Name: Antonio Bowen MRN: [id number] DOB: [birth year] ? ?Antonio Bowen is a 76 y.o. year old male who is a primary care patient of [name], [name], MD. Antonio Bowen is currently enrolled in care management services. An additional referral for Pharm D was placed.  ? ?Follow up plan: ?Unsuccessful telephone outreach attempt made. A HIPAA compliant phone message was left for the patient providing contact information and requesting a return call.  ?The care management team will reach out to the patient again over the next 7 days.  ?If patient returns call to provider office, please advise to call [place]  at [phone] ? ?Noreene Larsson, RMA ?Care Guide, Embedded Care Coordination ?[facility]  [facility]  ?[place], [place] [address] ?Direct Dial: [phone] ?Museum/gallery [email or web address] ?Website: [email or web address]  ? ?

## 2021-08-17 NOTE — Telephone Encounter (Signed)
Yes! I can do through CCM ?Have Dr. Warrick Parisian put in a CCM referral to me to get patient assistance ? ?Will route to [employer] for scheduling ?

## 2021-08-18 NOTE — Addendum Note (Signed)
Addended by: Caryl Pina on: 08/18/2021 01:04 PM ? ? Modules accepted: Orders ? ?

## 2021-08-18 NOTE — Telephone Encounter (Signed)
Sent referral 

## 2021-08-22 NOTE — Chronic Care Management (AMB) (Signed)
?  Care Management  ? ?Note ? ?08/22/2021 ?Name: Antonio Bowen MRN: [id number] DOB: 01-Sep-1945 ? ?Antonio Bowen is a 76 y.o. year old male who is a primary care patient of [name], [name], MD. I reached out to [name] by phone today offer care coordination services.  ? ?[name] was given information about care management services today including:  ?Care management services include personalized support from designated clinical staff supervised by his physician, including individualized plan of care and coordination with other care providers ?24/7 contact phone numbers for assistance for urgent and routine care needs. ?The patient may stop care management services at any time by phone call to the office staff. ? ?Patient did not agree to enrollment in care management services and does not wish to consider at this time. ? ?Follow up plan: ?Patient declines further follow up and engagement by the care management team. Appropriate care team members and provider have been notified via electronic communication.  ? ?Noreene Larsson, RMA ?Care Guide, Embedded Care Coordination ?[facility]  [facility]  ?[place], [place] [address] ?Direct Dial: [phone] ?Museum/gallery conservator.[email or web address] ?Website: [email or web address]  ? ?

## 2021-08-25 ENCOUNTER — Telehealth: Payer: Self-pay

## 2021-08-25 NOTE — Telephone Encounter (Signed)
Pt would like to know status of referral to Dr Henrene Pastor from 3/7. Thanks! ?

## 2021-08-28 ENCOUNTER — Ambulatory Visit (INDEPENDENT_AMBULATORY_CARE_PROVIDER_SITE_OTHER): Payer: Medicare HMO

## 2021-08-28 DIAGNOSIS — I639 Cerebral infarction, unspecified: Secondary | ICD-10-CM

## 2021-08-29 LAB — CUP PACEART REMOTE DEVICE CHECK
Date Time Interrogation Session: 20230502084049
Implantable Pulse Generator Implant Date: 20210708

## 2021-09-12 NOTE — Progress Notes (Signed)
Carelink Summary Report / Loop Recorder 

## 2021-10-02 ENCOUNTER — Ambulatory Visit (INDEPENDENT_AMBULATORY_CARE_PROVIDER_SITE_OTHER): Payer: Medicare HMO

## 2021-10-02 DIAGNOSIS — I639 Cerebral infarction, unspecified: Secondary | ICD-10-CM | POA: Diagnosis not present

## 2021-10-03 LAB — CUP PACEART REMOTE DEVICE CHECK
Date Time Interrogation Session: 20230606093519
Implantable Pulse Generator Implant Date: 20210708

## 2021-10-11 DIAGNOSIS — Z5181 Encounter for therapeutic drug level monitoring: Secondary | ICD-10-CM | POA: Diagnosis not present

## 2021-10-11 DIAGNOSIS — G90529 Complex regional pain syndrome I of unspecified lower limb: Secondary | ICD-10-CM | POA: Diagnosis not present

## 2021-10-11 DIAGNOSIS — M5136 Other intervertebral disc degeneration, lumbar region: Secondary | ICD-10-CM | POA: Diagnosis not present

## 2021-10-11 DIAGNOSIS — Z79899 Other long term (current) drug therapy: Secondary | ICD-10-CM | POA: Diagnosis not present

## 2021-10-11 DIAGNOSIS — M5416 Radiculopathy, lumbar region: Secondary | ICD-10-CM | POA: Diagnosis not present

## 2021-10-11 DIAGNOSIS — M5451 Vertebrogenic low back pain: Secondary | ICD-10-CM | POA: Diagnosis not present

## 2021-10-16 ENCOUNTER — Other Ambulatory Visit: Payer: Self-pay | Admitting: Family Medicine

## 2021-10-17 ENCOUNTER — Telehealth: Payer: Self-pay | Admitting: Family Medicine

## 2021-10-17 DIAGNOSIS — J441 Chronic obstructive pulmonary disease with (acute) exacerbation: Secondary | ICD-10-CM

## 2021-10-17 NOTE — Telephone Encounter (Signed)
As of 10/28/21 [employer] is using [employer] for their DME Oxygen supplier. Pt's are having to change suppliers. Per rep at [facility], just need to fax order to Intake at [phone], they do not need OV notes. In looking under media pt is on 2L via [employer] nocturnal for J44.9  Please sign pending order

## 2021-10-18 NOTE — Telephone Encounter (Signed)
I have printed and signed order for the patient, can we fax it in for him

## 2021-10-19 NOTE — Progress Notes (Signed)
Carelink Summary Report / Loop Recorder 

## 2021-10-19 NOTE — Addendum Note (Signed)
Addended by: Christia Reading on: 10/19/2021 03:31 PM   Modules accepted: Orders

## 2021-10-19 NOTE — Telephone Encounter (Signed)
Pt made aware that order has been faxed. He was very Patent attorney.  Advised pt to call them later just to make sure everything went through.

## 2021-11-05 LAB — CUP PACEART REMOTE DEVICE CHECK
Date Time Interrogation Session: 20230703231233
Implantable Pulse Generator Implant Date: 20210708

## 2021-11-06 ENCOUNTER — Ambulatory Visit (INDEPENDENT_AMBULATORY_CARE_PROVIDER_SITE_OTHER): Payer: Medicare HMO

## 2021-11-06 ENCOUNTER — Ambulatory Visit: Payer: Self-pay | Admitting: *Deleted

## 2021-11-06 DIAGNOSIS — I639 Cerebral infarction, unspecified: Secondary | ICD-10-CM

## 2021-11-06 DIAGNOSIS — R252 Cramp and spasm: Secondary | ICD-10-CM | POA: Diagnosis not present

## 2021-11-06 NOTE — Chronic Care Management (AMB) (Signed)
  Chronic Care Management   Note  11/06/2021 Name: Antonio Bowen MRN: [id number] DOB: 07-Apr-1946   Patient has not recently engaged with the Chronic Care Management RN Care Manager. Removing RN Care Manager from Care Team and closing [place]. If patient is currently engaged with another CCM team member I will forward this encounter to inform them of my case closure. Patient may be eligible for re-engagement with RN Care Manager in the future if necessary and can discuss this with their PCP.  [name], BSN, RN-BC Embedded Chronic Care Manager [facility] Direct Dial: [phone]

## 2021-11-21 ENCOUNTER — Encounter: Payer: Self-pay | Admitting: Family Medicine

## 2021-11-23 ENCOUNTER — Telehealth: Payer: Self-pay | Admitting: *Deleted

## 2021-11-23 NOTE — Telephone Encounter (Signed)
LMOVM to call back to discuss refill request from [facility] RF request for Cyclobenzaprine 10 mg 1 BID prn for muscle spasms #20 This was prescribed by Dietrich Pates, PA at [facility] on 11/06/21, it is under reconcile meds Since we have not prescribed this before I am going to recommend pt be seen.

## 2021-11-23 NOTE — Telephone Encounter (Signed)
Pt returned missed call. Reviewed Cathys note with patient. Pt voiced understanding but did not want to make an appt.

## 2021-11-23 NOTE — Telephone Encounter (Signed)
This is not our pt. Needs to go to Dr. Warrick Parisian

## 2021-11-29 ENCOUNTER — Encounter: Payer: Self-pay | Admitting: Internal Medicine

## 2021-11-29 ENCOUNTER — Ambulatory Visit: Payer: Medicare HMO | Admitting: Internal Medicine

## 2021-11-29 VITALS — BP 124/70 | HR 91 | Ht 69.0 in | Wt 197.1 lb

## 2021-11-29 DIAGNOSIS — R1310 Dysphagia, unspecified: Secondary | ICD-10-CM

## 2021-11-29 DIAGNOSIS — K222 Esophageal obstruction: Secondary | ICD-10-CM

## 2021-11-29 DIAGNOSIS — Z7902 Long term (current) use of antithrombotics/antiplatelets: Secondary | ICD-10-CM

## 2021-11-29 NOTE — Patient Instructions (Signed)
You have been scheduled for an endoscopy. Please follow written instructions given to you at your visit today. If you use inhalers (even only as needed), please bring them with you on the day of your procedure.   

## 2021-11-29 NOTE — Progress Notes (Signed)
HISTORY OF PRESENT ILLNESS:  Antonio Bowen is a 76 y.o. male with multiple significant medical problems including hypertension, COPD requiring oxygen, and recurrent stroke for which she is on chronic Plavix therapy.  Patient presents today with his wife regarding progressive and severe intermittent solid food dysphagia.  I last saw the patient August 18, 2019 when he underwent upper endoscopy with esophageal dilation to a maximal diameter of 54 [place].  He was placed on omeprazole 40 mg TWICE daily.  He was told to follow-up in 6 to 8 weeks, but did not.  He is accompanied today by his wife.  Patient tells me that post dilation his swallowing significantly improved.  However, over the past year he has noticed recurrent worsening intermittent solid food dysphagia with transient food impaction that required self-induced vomiting for relief.  Since I last saw the patient he has had a stroke on 2 occasions.  He is maintained on chronic Plavix therapy daily.  No aspirin.  He continues to require oxygen at night for his COPD.  Patient has undergone colonoscopy on a number of occasions, most recently 2014 with diminutive adenoma.  He had been prescribed Carafate in the past and wonders if he should not be on Carafate at this time.  REVIEW OF SYSTEMS:  All non-GI ROS negative unless otherwise stated in the HPI except for arthritis, back pain, fatigue, hearing problems, muscle cramps, night sweats, sleeping problems, excessive urination, urinary frequency, shortness of breath  Past Medical History:  Diagnosis Date   Allergic rhinitis    Arthritis    BOOP (bronchiolitis obliterans with organizing pneumonia) ([place])    Bronchitis    Colon polyps    COPD (chronic obstructive pulmonary disease) ([place])    Dyspnea    with exertion    Dysrhythmia    skipped beats- followed by Dr Angelena Form    GERD (gastroesophageal reflux disease)    Hypertension    Hypothyroidism    Kidney stone    Low back pain    Oxygen  dependent    uses 3L/[place] at nite occasional during day if needed    Stroke [facility])    hx of 2 strokes which patient did not know he had had    Thyroid disease    Tobacco dependence     Past Surgical History:  Procedure Laterality Date   BIOPSY  08/18/2019   Procedure: BIOPSY;  Surgeon: Irene Shipper, MD;  Location: [facility];  Service: Endoscopy;;   broncoscopy      carpel tunnal     left   COLONOSCOPY     x 4   DUPUYTREN CONTRACTURE RELEASE     Left   ESOPHAGOGASTRODUODENOSCOPY (EGD) WITH PROPOFOL N/A 03/26/2016   Procedure: ESOPHAGOGASTRODUODENOSCOPY (EGD) WITH PROPOFOL;  Surgeon: Irene Shipper, MD;  Location: [facility];  Service: Endoscopy;  Laterality: N/A;   ESOPHAGOGASTRODUODENOSCOPY (EGD) WITH PROPOFOL N/A 08/18/2019   Procedure: ESOPHAGOGASTRODUODENOSCOPY (EGD) WITH PROPOFOL;  Surgeon: Irene Shipper, MD;  Location: [facility];  Service: Endoscopy;  Laterality: N/A;  Possible dilation   ESOPHAGOGASTRODUODENOSCOPY ENDOSCOPY     x 3   FACIAL FRACTURE SURGERY     LOOP RECORDER INSERTION N/A 11/05/2019   Procedure: LOOP RECORDER INSERTION;  Surgeon: Thompson Grayer, MD;  Location: [facility];  Service: Cardiovascular;  Laterality: N/A;   OTHER SURGICAL HISTORY     facial surgery   SAVORY DILATION N/A 08/18/2019   Procedure: SAVORY DILATION;  Surgeon: Irene Shipper, MD;  Location:  [facility];  Service: Endoscopy;  Laterality: N/A;   TOTAL HIP ARTHROPLASTY  01/2011   Right hip, [place]   ULNAR NERVE TRANSPOSITION     left    Social History Antonio Bowen  reports that he quit smoking about 11 years ago. His smoking use included cigarettes. He has a 50.00 pack-year smoking history. His smokeless tobacco use includes snuff. He reports current alcohol use. He reports that he does not use drugs.  family history includes Arthritis in his father; Cancer in his mother and paternal grandmother; Colon cancer in an other family member; Healthy in his daughter and son;  Prostate cancer in his maternal grandfather; Stomach cancer in his maternal grandmother; Stroke (age of onset: 26) in his father.  Allergies  Allergen Reactions   Nuts Nausea And Vomiting    sweating   Other Nausea And Vomiting    Per patient, cannot take any narcotic. Nausea and vomiting sweating   Vicodin [Hydrocodone-Acetaminophen]     tachycardia   Codeine    Aspirin Other (See Comments)    Upset stomach, bleeding   Penicillins Hives and Rash    Has patient had a PCN reaction causing immediate rash, facial/tongue/throat swelling, SOB or lightheadedness with hypotension:No Has patient had a PCN reaction causing severe rash involving mucus membranes or skin necrosis:No Has patient had a PCN reaction that required hospitalization:No Has patient had a PCN reaction occurring within the last 10 years:Yes If all of the above answers are "NO", then may proceed with Cephalosporin use.        PHYSICAL EXAMINATION: Vital signs: BP 124/70   Pulse 91   Ht '5\' 9"'$  (1.753 m)   Wt 197 lb 2 oz (89.4 kg)   BMI 29.11 kg/m   Constitutional: generally well-appearing, no acute distress Psychiatric: alert and oriented x 3, cooperative Eyes: extraocular movements intact, anicteric, conjunctiva pink Mouth: oral pharynx moist, no lesions Neck: supple no lymphadenopathy Cardiovascular: heart regular rate and rhythm, no murmur Lungs: clear to auscultation bilaterally Abdomen: soft, nontender, nondistended, no obvious ascites, no peritoneal signs, normal bowel sounds, no organomegaly Rectal: Omitted Extremities: no clubbing, cyanosis, or lower extremity edema bilaterally Skin: no lesions on visible extremities Neuro: No focal deficits.  Cranial nerves intact  ASSESSMENT:  1.  Recurrent worsening and significant intermittent solid food dysphagia likely secondary to recurrent peptic stricture. 2.  Chronic GERD.  Has been compliant with twice daily PPI therapy 3.  Recurrent stroke on Plavix 4.   Oxygen at night requiring COPD 5.  History of adenomatous polyps.  Aged out of surveillance given his comorbidities 6.  Multiple other general medical problems   PLAN:  1.  Upper endoscopy with esophageal dilation.  The patient is very HIGH RISK given his chronic Plavix therapy needed and oxygen requiring COPD. 2.  We discussed the pros and cons of esophageal dilation on Plavix and off Plavix.  All parties mutually agree that the lesser tubules may be too perform his procedure maintaining him on Plavix.  He is increased risk of bleeding.  However, recurrent stroke off Plavix might disastrous.  We also discussed not performing the procedure and living life with a very modified diet.  He is not interested in that approach.  His wife concurs. 3.  The procedures will need to be performed at the hospital with monitored anesthesia care 4.  Continue PPI.  No indication for Carafate

## 2021-12-05 NOTE — Progress Notes (Signed)
Carelink Summary Report / Loop Recorder 

## 2021-12-07 LAB — CUP PACEART REMOTE DEVICE CHECK
Date Time Interrogation Session: 20230805231149
Implantable Pulse Generator Implant Date: 20210708

## 2021-12-11 ENCOUNTER — Ambulatory Visit: Payer: Medicare HMO

## 2022-01-08 ENCOUNTER — Other Ambulatory Visit: Payer: Self-pay | Admitting: Family Medicine

## 2022-01-08 DIAGNOSIS — I6389 Other cerebral infarction: Secondary | ICD-10-CM

## 2022-01-15 ENCOUNTER — Ambulatory Visit (INDEPENDENT_AMBULATORY_CARE_PROVIDER_SITE_OTHER): Payer: Medicare HMO

## 2022-01-15 DIAGNOSIS — I639 Cerebral infarction, unspecified: Secondary | ICD-10-CM

## 2022-01-16 LAB — CUP PACEART REMOTE DEVICE CHECK
Date Time Interrogation Session: 20230917231844
Implantable Pulse Generator Implant Date: 20210708

## 2022-01-23 ENCOUNTER — Encounter (HOSPITAL_COMMUNITY): Payer: Self-pay | Admitting: Internal Medicine

## 2022-01-24 ENCOUNTER — Encounter (HOSPITAL_COMMUNITY): Payer: Self-pay | Admitting: Internal Medicine

## 2022-01-25 NOTE — Progress Notes (Signed)
Antonio Bowen  Bowel Prep reminder:n/a NPO after midnight   For Anesthesia: PCP - [name], Vonna Kotyk  Cardiologist - n/a Neurologist -Dr. Leonie Man last visit 01/2021  Chest x-ray - 07/2019 EKG - 11/04/19 Stress Test - n/a ECHO - 11/04/19 Cardiac Cath - n/a Pacemaker/ICD device last checked: loop recorder checked 01/14/22 Spinal Cord Stimulator: n/a Sleep Study - n/a CPAP -  no but wears 3L o2 only at night    Blood Thinner Instructions: Plavix told by MD perry to stay on  Aspirin Instructions: n/a Last Dose: on currently     Anesthesia review: On o2 at night, hx of HTN, COPD, Stroke 10/2019, blood thinner

## 2022-01-29 NOTE — Progress Notes (Signed)
Carelink Summary Report / Loop Recorder 

## 2022-01-30 ENCOUNTER — Encounter (HOSPITAL_COMMUNITY): Payer: Self-pay | Admitting: Internal Medicine

## 2022-01-30 ENCOUNTER — Encounter (HOSPITAL_COMMUNITY)
Admission: RE | Disposition: A | Discharge status: Home / Self Care | Payer: Self-pay | Source: Ambulatory Visit | Attending: Internal Medicine

## 2022-01-30 ENCOUNTER — Ambulatory Visit (HOSPITAL_BASED_OUTPATIENT_CLINIC_OR_DEPARTMENT_OTHER): Payer: Medicare HMO | Admitting: Physician Assistant

## 2022-01-30 ENCOUNTER — Ambulatory Visit (HOSPITAL_COMMUNITY): Payer: Medicare HMO | Admitting: Physician Assistant

## 2022-01-30 ENCOUNTER — Ambulatory Visit (HOSPITAL_COMMUNITY)
Admission: RE | Admit: 2022-01-30 | Discharge: 2022-01-30 | Disposition: A | Discharge status: Home / Self Care | Payer: Medicare HMO | Source: Ambulatory Visit | Attending: Internal Medicine | Admitting: Internal Medicine

## 2022-01-30 ENCOUNTER — Other Ambulatory Visit: Payer: Self-pay

## 2022-01-30 DIAGNOSIS — I10 Essential (primary) hypertension: Secondary | ICD-10-CM | POA: Diagnosis not present

## 2022-01-30 DIAGNOSIS — Z87891 Personal history of nicotine dependence: Secondary | ICD-10-CM | POA: Insufficient documentation

## 2022-01-30 DIAGNOSIS — K449 Diaphragmatic hernia without obstruction or gangrene: Secondary | ICD-10-CM | POA: Insufficient documentation

## 2022-01-30 DIAGNOSIS — R1310 Dysphagia, unspecified: Secondary | ICD-10-CM

## 2022-01-30 DIAGNOSIS — N189 Chronic kidney disease, unspecified: Secondary | ICD-10-CM

## 2022-01-30 DIAGNOSIS — K219 Gastro-esophageal reflux disease without esophagitis: Secondary | ICD-10-CM | POA: Insufficient documentation

## 2022-01-30 DIAGNOSIS — Z8673 Personal history of transient ischemic attack (TIA), and cerebral infarction without residual deficits: Secondary | ICD-10-CM | POA: Diagnosis not present

## 2022-01-30 DIAGNOSIS — E039 Hypothyroidism, unspecified: Secondary | ICD-10-CM

## 2022-01-30 DIAGNOSIS — Z79899 Other long term (current) drug therapy: Secondary | ICD-10-CM | POA: Insufficient documentation

## 2022-01-30 DIAGNOSIS — K222 Esophageal obstruction: Secondary | ICD-10-CM | POA: Diagnosis not present

## 2022-01-30 DIAGNOSIS — Z7902 Long term (current) use of antithrombotics/antiplatelets: Secondary | ICD-10-CM | POA: Insufficient documentation

## 2022-01-30 DIAGNOSIS — J449 Chronic obstructive pulmonary disease, unspecified: Secondary | ICD-10-CM | POA: Diagnosis not present

## 2022-01-30 DIAGNOSIS — I129 Hypertensive chronic kidney disease with stage 1 through stage 4 chronic kidney disease, or unspecified chronic kidney disease: Secondary | ICD-10-CM

## 2022-01-30 DIAGNOSIS — Z9981 Dependence on supplemental oxygen: Secondary | ICD-10-CM | POA: Diagnosis not present

## 2022-01-30 HISTORY — PX: BALLOON DILATION: SHX5330

## 2022-01-30 HISTORY — PX: ESOPHAGOGASTRODUODENOSCOPY (EGD) WITH PROPOFOL: SHX5813

## 2022-01-30 SURGERY — ESOPHAGOGASTRODUODENOSCOPY (EGD) WITH PROPOFOL
Anesthesia: Monitor Anesthesia Care

## 2022-01-30 MED ORDER — PROPOFOL 10 MG/ML IV BOLUS
INTRAVENOUS | Status: DC | PRN
  Administered 2022-01-30: 20 mg via INTRAVENOUS
  Administered 2022-01-30: 10 mg via INTRAVENOUS
  Administered 2022-01-30: 20 mg via INTRAVENOUS

## 2022-01-30 MED ORDER — TRAMADOL HCL 50 MG PO TABS
50.0000 mg | ORAL_TABLET | Freq: Once | ORAL | Status: DC
  Filled 2022-01-30: qty 1

## 2022-01-30 MED ORDER — SODIUM CHLORIDE 0.9 % IV SOLN: INTRAVENOUS | Status: DC

## 2022-01-30 MED ORDER — PROPOFOL 500 MG/50ML IV EMUL
INTRAVENOUS | Status: AC
  Filled 2022-01-30: qty 50

## 2022-01-30 MED ORDER — LIDOCAINE 20MG/ML (2%) 15 ML SYRINGE OPTIME
INTRAMUSCULAR | Status: DC | PRN
  Administered 2022-01-30: 100 mg via INTRAVENOUS

## 2022-01-30 MED ORDER — MELOXICAM 15 MG PO TABS
15.0000 mg | ORAL_TABLET | Freq: Once | ORAL | Status: AC
  Administered 2022-01-30: 15 mg via ORAL
  Filled 2022-01-30: qty 1

## 2022-01-30 MED ORDER — LACTATED RINGERS IV SOLN: INTRAVENOUS | Status: AC | PRN

## 2022-01-30 MED ORDER — SODIUM CHLORIDE 0.9 % IV SOLN
INTRAVENOUS | Status: AC | PRN
  Administered 2022-01-30: 500 mL via INTRAMUSCULAR

## 2022-01-30 MED ORDER — PROPOFOL 500 MG/50ML IV EMUL
INTRAVENOUS | Status: DC | PRN
  Administered 2022-01-30: 100 ug/kg/min via INTRAVENOUS

## 2022-01-30 SURGICAL SUPPLY — 15 items

## 2022-01-30 NOTE — Progress Notes (Addendum)
Pt presented to endoscopy today for EGD with dilation with MD Henrene Pastor. Post-operatively, pt c/o 9/10 pain in the right hip. Per pt and wife, he has chronic pain in the R hip. He did not take his AM meloxicam or tramadol. MDA Brock notified. VO for 15 mg meloxicam and 50 mg tramadol PO obtained.  Debarah Crape, RN 01/30/22 10:16 AM  Addendum 9471 - After resting, pt now states pain is 2/10. MDA Brock notified. VO to d/c order for tramadol, give meloxicam. Meloxicam given as ordered.  Addendum 1044 - Post-Meloxicam, pt states pain is 1/10. MDA Brock notified. Okay to discharge.

## 2022-01-30 NOTE — Anesthesia Preprocedure Evaluation (Addendum)
Anesthesia Evaluation  Patient identified by MRN, date of birth, ID band Patient awake    Reviewed: Allergy & Precautions, NPO status , Patient's Chart, lab work & pertinent test results  History of Anesthesia Complications Negative for: history of anesthetic complications  Airway Mallampati: II  TM Distance: >3 FB Neck ROM: Full    Dental  (+) Dental Advisory Given, Upper Dentures, Lower Dentures   Pulmonary COPD,  COPD inhaler and oxygen dependent, former smoker,   Hx BOOP    Pulmonary exam normal        Cardiovascular hypertension, Pt. on medications Normal cardiovascular exam+ dysrhythmias    '21 TTE - EF 60 to 65%. Not seen well mitral valve regurgitation. (No MR on 2016 TTE)    Neuro/Psych CVA, Residual Symptoms negative psych ROS   GI/Hepatic Neg liver ROS, GERD  Medicated and Controlled,  Endo/Other  Hypothyroidism   Renal/GU CRFRenal disease     Musculoskeletal  (+) Arthritis ,   Abdominal   Peds  Hematology  On plavix    Anesthesia Other Findings   Reproductive/Obstetrics                            Anesthesia Physical Anesthesia Plan  ASA: 3  Anesthesia Plan: MAC   Post-op Pain Management: Minimal or no pain anticipated   Induction:   PONV Risk Score and Plan: 1 and Propofol infusion and Treatment may vary due to age or medical condition  Airway Management Planned: Nasal Cannula and Natural Airway  Additional Equipment: None  Intra-op Plan:   Post-operative Plan:   Informed Consent: I have reviewed the patients History and Physical, chart, labs and discussed the procedure including the risks, benefits and alternatives for the proposed anesthesia with the patient or authorized representative who has indicated his/her understanding and acceptance.       Plan Discussed with: CRNA and Anesthesiologist  Anesthesia Plan Comments:        Anesthesia Quick  Evaluation

## 2022-01-30 NOTE — Transfer of Care (Addendum)
Immediate Anesthesia Transfer of Care Note  Patient: Antonio Bowen  Procedure(s) Performed: ESOPHAGOGASTRODUODENOSCOPY (EGD) WITH PROPOFOL BALLOON DILATION  Patient Location: PACU  Anesthesia Type:MAC  Level of Consciousness: sedated  Airway & Oxygen Therapy: Patient Spontanous Breathing and Patient connected to face mask oxygen  Post-op Assessment: Report given to RN and Post -op Vital signs reviewed and stable  Post vital signs: Reviewed and stable  Last Vitals:  Vitals Value Taken Time  BP    Temp    Pulse    Resp    SpO2      Last Pain:  Vitals:   01/30/22 0824  TempSrc: [place]  PainSc: 0-No pain         Complications: No notable events documented.

## 2022-01-30 NOTE — H&P (Signed)
HISTORY OF PRESENT ILLNESS:   Antonio Bowen is a 76 y.o. male with multiple significant medical problems including hypertension, COPD requiring oxygen, and recurrent stroke for which she is on chronic Plavix therapy.  Patient presents today with his wife regarding progressive and severe intermittent solid food dysphagia.  I last saw the patient August 18, 2019 when he underwent upper endoscopy with esophageal dilation to a maximal diameter of 54 [place].  He was placed on omeprazole 40 mg TWICE daily.  He was told to follow-up in 6 to 8 weeks, but did not.  He is accompanied today by his wife.  Patient tells me that post dilation his swallowing significantly improved.  However, over the past year he has noticed recurrent worsening intermittent solid food dysphagia with transient food impaction that required self-induced vomiting for relief.  Since I last saw the patient he has had a stroke on 2 occasions.  He is maintained on chronic Plavix therapy daily.  No aspirin.  He continues to require oxygen at night for his COPD.  Patient has undergone colonoscopy on a number of occasions, most recently 2014 with diminutive adenoma.  He had been prescribed Carafate in the past and wonders if he should not be on Carafate at this time.   REVIEW OF SYSTEMS:   All non-GI ROS negative unless otherwise stated in the HPI except for arthritis, back pain, fatigue, hearing problems, muscle cramps, night sweats, sleeping problems, excessive urination, urinary frequency, shortness of breath       Past Medical History:  Diagnosis Date   Allergic rhinitis     Arthritis     BOOP (bronchiolitis obliterans with organizing pneumonia) ([place])     Bronchitis     Colon polyps     COPD (chronic obstructive pulmonary disease) ([place])     Dyspnea      with exertion    Dysrhythmia      skipped beats- followed by Dr Angelena Form    GERD (gastroesophageal reflux disease)     Hypertension     Hypothyroidism     Kidney stone     Low  back pain     Oxygen dependent      uses 3L/[place] at nite occasional during day if needed    Stroke [facility])      hx of 2 strokes which patient did not know he had had    Thyroid disease     Tobacco dependence             Past Surgical History:  Procedure Laterality Date   BIOPSY   08/18/2019    Procedure: BIOPSY;  Surgeon: Irene Shipper, MD;  Location: [facility];  Service: Endoscopy;;   broncoscopy        carpel tunnal        left   COLONOSCOPY        x 4   DUPUYTREN CONTRACTURE RELEASE        Left   ESOPHAGOGASTRODUODENOSCOPY (EGD) WITH PROPOFOL N/A 03/26/2016    Procedure: ESOPHAGOGASTRODUODENOSCOPY (EGD) WITH PROPOFOL;  Surgeon: Irene Shipper, MD;  [facility];  Service: Endoscopy;  Laterality: N/A;   ESOPHAGOGASTRODUODENOSCOPY (EGD) WITH PROPOFOL N/A 08/18/2019    Procedure: ESOPHAGOGASTRODUODENOSCOPY (EGD) WITH PROPOFOL;  Surgeon: Irene Shipper, MD;  [facility];  Service: Endoscopy;  Laterality: N/A;  Possible dilation   ESOPHAGOGASTRODUODENOSCOPY ENDOSCOPY        x 3   FACIAL FRACTURE SURGERY       LOOP RECORDER  INSERTION N/A 11/05/2019    Procedure: LOOP RECORDER INSERTION;  Surgeon: Thompson Grayer, MD;  Location: [facility];  Service: Cardiovascular;  Laterality: N/A;   OTHER SURGICAL HISTORY        facial surgery   SAVORY DILATION N/A 08/18/2019    Procedure: SAVORY DILATION;  Surgeon: Irene Shipper, MD;  [facility];  Service: Endoscopy;  Laterality: N/A;   TOTAL HIP ARTHROPLASTY   01/2011    Right hip, [place]   ULNAR NERVE TRANSPOSITION        left      Social History Antonio Bowen  reports that he quit smoking about 11 years ago. His smoking use included cigarettes. He has a 50.00 pack-year smoking history. His smokeless tobacco use includes snuff. He reports current alcohol use. He reports that he does not use drugs.   family history includes Arthritis in his father; Cancer in his mother and paternal grandmother; Colon  cancer in an other family member; Healthy in his daughter and son; Prostate cancer in his maternal grandfather; Stomach cancer in his maternal grandmother; Stroke (age of onset: 47) in his father.        Allergies  Allergen Reactions   Nuts Nausea And Vomiting      sweating   Other Nausea And Vomiting      Per patient, cannot take any narcotic. Nausea and vomiting sweating   Vicodin [Hydrocodone-Acetaminophen]        tachycardia   Codeine     Aspirin Other (See Comments)      Upset stomach, bleeding   Penicillins Hives and Rash      Has patient had a PCN reaction causing immediate rash, facial/tongue/throat swelling, SOB or lightheadedness with hypotension:No Has patient had a PCN reaction causing severe rash involving mucus membranes or skin necrosis:No Has patient had a PCN reaction that required hospitalization:No Has patient had a PCN reaction occurring within the last 10 years:Yes If all of the above answers are "NO", then may proceed with Cephalosporin use.            PHYSICAL EXAMINATION: Vital signs: BP 124/70   Pulse 91   Ht '5\' 9"'$  (1.753 m)   Wt 197 lb 2 oz (89.4 kg)   BMI 29.11 kg/m   Constitutional: generally well-appearing, no acute distress Psychiatric: alert and oriented x 3, cooperative Eyes: extraocular movements intact, anicteric, conjunctiva pink Mouth: oral pharynx moist, no lesions Neck: supple no lymphadenopathy Cardiovascular: heart regular rate and rhythm, no murmur Lungs: clear to auscultation bilaterally Abdomen: soft, nontender, nondistended, no obvious ascites, no peritoneal signs, normal bowel sounds, no organomegaly Rectal: Omitted Extremities: no clubbing, cyanosis, or lower extremity edema bilaterally Skin: no lesions on visible extremities Neuro: No focal deficits.  Cranial nerves intact   ASSESSMENT:   1.  Recurrent worsening and significant intermittent solid food dysphagia likely secondary to recurrent peptic stricture. 2.  Chronic  GERD.  Has been compliant with twice daily PPI therapy 3.  Recurrent stroke on Plavix 4.  Oxygen at night requiring COPD 5.  History of adenomatous polyps.  Aged out of surveillance given his comorbidities 6.  Multiple other general medical problems     PLAN:   1.  Upper endoscopy with esophageal dilation.  The patient is very HIGH RISK given his chronic Plavix therapy needed and oxygen requiring COPD. 2.  We discussed the pros and cons of esophageal dilation on Plavix and off Plavix.  All parties mutually agree that the lesser  tubules may be too perform his procedure maintaining him on Plavix.  He is increased risk of bleeding.  However, recurrent stroke off Plavix might disastrous.  We also discussed not performing the procedure and living life with a very modified diet.  He is not interested in that approach.  His wife concurs. 3.  The procedures will need to be performed at the hospital with monitored anesthesia care 4.  Continue PPI.  No indication for Carafate   ADDENDUM: Recent office evaluation with complete H&P as outlined above.  No interval changes in history or physical exam.  He has continued on Plavix as previously discussed after EGD with dilation.  I again reiterated the higher than average risk for bleeding complication.  He understands

## 2022-01-30 NOTE — Anesthesia Postprocedure Evaluation (Signed)
Anesthesia Post Note  Patient: Antonio Bowen  Procedure(s) Performed: ESOPHAGOGASTRODUODENOSCOPY (EGD) WITH PROPOFOL BALLOON DILATION     Patient location during evaluation: PACU Anesthesia Type: MAC Level of consciousness: awake and alert Pain management: pain level controlled Vital Signs Assessment: post-procedure vital signs reviewed and stable Respiratory status: spontaneous breathing, nonlabored ventilation and respiratory function stable Cardiovascular status: stable and blood pressure returned to baseline Anesthetic complications: no   No notable events documented.  Last Vitals:  Vitals:   01/30/22 1030 01/30/22 1040  BP: 139/84 134/77  Pulse: 70 69  Resp: 19 16  Temp:    SpO2: 98% 98%    Last Pain:  Vitals:   01/30/22 1040  TempSrc:   PainSc: [place]

## 2022-01-30 NOTE — Op Note (Signed)
[facility] Patient Name: Antonio Bowen Procedure Date: 01/30/2022 MRN: [id number] Attending MD: Docia Chuck. Henrene Pastor , MD Date of Birth: [birth year] CSN: [id number] Age: 76 Admit Type: Outpatient Procedure:                Upper GI endoscopy with balloon dilation of the                            esophagus?"18 mm Indications:              Dysphagia, Therapeutic procedure Providers:                Docia Chuck. Henrene Pastor, MD, Doristine Johns, RN, Benetta Spar, Technician Referring MD:             Caryl Pina MD Medicines:                Monitored Anesthesia Care Complications:            No immediate complications. Estimated Blood Loss:     Estimated blood loss: none. Procedure:                Pre-Anesthesia Assessment:                           - Prior to the procedure, a History and Physical                            was performed, and patient medications and                            allergies were reviewed. The patient's tolerance of                            previous anesthesia was also reviewed. The risks                            and benefits of the procedure and the sedation                            options and risks were discussed with the patient.                            All questions were answered, and informed consent                            was obtained. Prior Anticoagulants: The patient has                            taken Plavix (clopidogrel), last dose was 1 day                            prior to procedure. ASA Grade Assessment: III - A  patient with severe systemic disease. After                            reviewing the risks and benefits, the patient was                            deemed in satisfactory condition to undergo the                            procedure.                           After obtaining informed consent, the endoscope was                            passed under direct  vision. Throughout the                            procedure, the patient's blood pressure, pulse, and                            oxygen saturations were monitored continuously. The                            [id number]-[id number] ([id number] Olympus endoscope was introduced                            through the mouth, and advanced to the second part                            of duodenum. The upper GI endoscopy was                            accomplished without difficulty. The patient                            tolerated the procedure well. Scope In: Scope Out: Findings:      The esophagus was somewhat tortuous.      One benign-appearing, intrinsic moderate stenosis was found 38 cm from       the incisors. This stenosis was ringlike and fibrotic and measured 1.2       cm (inner diameter). A TTS dilator was passed through the scope.       Dilation with an 18-19-20 mm balloon dilator was performed to 18 mm.       There was disruption of the ring with minor venous oozing. See images      The stomach was normal save hiatal hernia and benign fundic gland type       polyps.      The examined duodenum was normal.      The cardia and gastric fundus were normal on retroflexion. Impression:               - Benign-appearing esophageal stenosis. Dilated.                           - Normal stomach  save hiatal hernia and diminutive                            polyps.                           - Normal examined duodenum.                           - No specimens collected. Moderate Sedation:      none Recommendation:           1. Patient has a contact number available for                            emergencies. The signs and symptoms of potential                            delayed complications were discussed with the                            patient. Return to normal activities tomorrow.                            Written discharge instructions were provided to the                            patient.                            2. Post dilation diet.                           3. Continue present medications, including Plavix.                           4. Continue to chew your food extremely well,                            swallow small portions, and drink plenty of water                            in between bites                           5. Return to the care of your primary provider Procedure Code(s):        --- Professional ---                           [phone], Esophagogastroduodenoscopy, flexible,                            transoral; with transendoscopic balloon dilation of                            esophagus (less than 30 mm diameter) Diagnosis Code(s):        --- Professional ---  K22.2, Esophageal obstruction                           R13.10, Dysphagia, unspecified CPT copyright 2019 [employer]. All rights reserved. The codes documented in this report are preliminary and upon coder review may  be revised to meet current compliance requirements. Docia Chuck. Henrene Pastor, MD 01/30/2022 9:45:54 AM This report has been signed electronically. Number of Addenda: 0

## 2022-01-30 NOTE — Discharge Instructions (Signed)
YOU HAD AN ENDOSCOPIC PROCEDURE TODAY: Refer to the procedure report and other information in the discharge instructions given to you for any specific questions about what was found during the examination. If this information does not answer your questions, please call [place] office at [phone] to clarify.  ° °YOU SHOULD EXPECT: Some feelings of bloating in the abdomen. Passage of more gas than usual. Walking can help get rid of the air that was put into your GI tract during the procedure and reduce the bloating.  ° °DIET: Your first meal following the procedure should be a light meal and then it is ok to progress to your normal diet. A half-sandwich or bowl of soup is an example of a good first meal. Heavy or fried foods are harder to digest and may make you feel nauseous or bloated. Drink plenty of fluids but you should avoid alcoholic beverages for 24 hours. ° °ACTIVITY: Your care partner should take you home directly after the procedure. You should plan to take it easy, moving slowly for the rest of the day. You can resume normal activity the day after the procedure however YOU SHOULD NOT DRIVE, use power tools, machinery or perform tasks that involve climbing or major physical exertion for 24 hours (because of the sedation medicines used during the test).  ° °SYMPTOMS TO REPORT IMMEDIATELY: °A gastroenterologist can be reached at any hour. Please call [phone]  for any of the following symptoms:  ° °Following upper endoscopy (EGD, EUS, ERCP, esophageal dilation) °Vomiting of blood or coffee ground material  °New, significant abdominal pain  °New, significant chest pain or pain under the shoulder blades  °Painful or persistently difficult swallowing  °New shortness of breath  °Black, tarry-looking or red, bloody stools ° °FOLLOW UP:  °If any biopsies were taken you will be contacted by phone or by letter within the next 1-3 weeks. Call [phone]  if you have not heard about the biopsies in 3 weeks.    °Please also call with any specific questions about appointments or follow up tests. ° °

## 2022-02-04 ENCOUNTER — Encounter (HOSPITAL_COMMUNITY): Payer: Self-pay | Admitting: Internal Medicine

## 2022-02-08 DIAGNOSIS — M5416 Radiculopathy, lumbar region: Secondary | ICD-10-CM | POA: Diagnosis not present

## 2022-02-08 DIAGNOSIS — M5136 Other intervertebral disc degeneration, lumbar region: Secondary | ICD-10-CM | POA: Diagnosis not present

## 2022-02-08 DIAGNOSIS — M4316 Spondylolisthesis, lumbar region: Secondary | ICD-10-CM | POA: Diagnosis not present

## 2022-02-15 ENCOUNTER — Ambulatory Visit (INDEPENDENT_AMBULATORY_CARE_PROVIDER_SITE_OTHER): Payer: Medicare HMO | Admitting: Family Medicine

## 2022-02-15 ENCOUNTER — Telehealth: Payer: Self-pay | Admitting: Family Medicine

## 2022-02-15 ENCOUNTER — Encounter: Payer: Self-pay | Admitting: Family Medicine

## 2022-02-15 VITALS — BP 132/77 | HR 95 | Temp 97.4°F | Ht 69.0 in | Wt 200.0 lb

## 2022-02-15 DIAGNOSIS — N1832 Chronic kidney disease, stage 3b: Secondary | ICD-10-CM

## 2022-02-15 DIAGNOSIS — E785 Hyperlipidemia, unspecified: Secondary | ICD-10-CM

## 2022-02-15 DIAGNOSIS — E039 Hypothyroidism, unspecified: Secondary | ICD-10-CM

## 2022-02-15 DIAGNOSIS — I10 Essential (primary) hypertension: Secondary | ICD-10-CM

## 2022-02-15 DIAGNOSIS — M1712 Unilateral primary osteoarthritis, left knee: Secondary | ICD-10-CM

## 2022-02-15 DIAGNOSIS — J441 Chronic obstructive pulmonary disease with (acute) exacerbation: Secondary | ICD-10-CM

## 2022-02-15 MED ORDER — OMEPRAZOLE 40 MG PO CPDR: 40.0000 mg | DELAYED_RELEASE_CAPSULE | Freq: Two times a day (BID) | ORAL | 3 refills | Status: DC

## 2022-02-15 MED ORDER — METHYLPREDNISOLONE ACETATE 80 MG/ML IJ SUSP
80.0000 mg | Freq: Once | INTRAMUSCULAR | Status: AC
  Administered 2022-02-15: 80 mg via INTRA_ARTICULAR

## 2022-02-15 MED ORDER — TRAZODONE HCL 50 MG PO TABS: 25.0000 mg | ORAL_TABLET | Freq: Every evening | ORAL | 3 refills | Status: DC | PRN

## 2022-02-15 MED ORDER — LEVOTHYROXINE SODIUM 137 MCG PO TABS: 137.0000 ug | ORAL_TABLET | Freq: Every day | ORAL | 3 refills | Status: DC

## 2022-02-15 MED ORDER — LOSARTAN POTASSIUM-HCTZ 50-12.5 MG PO TABS: 1.0000 | ORAL_TABLET | Freq: Every day | ORAL | 3 refills | Status: DC

## 2022-02-15 MED ORDER — MELOXICAM 15 MG PO TABS: 15.0000 mg | ORAL_TABLET | Freq: Every day | ORAL | 3 refills | Status: DC

## 2022-02-15 NOTE — Progress Notes (Signed)
BP 132/77   Pulse 95   Temp (!) 97.4 F (36.3 C)   Ht '5\' 9"'$  (1.753 m)   Wt 200 lb (90.7 kg)   SpO2 97%   BMI 29.53 kg/m    Subjective:   Patient ID: Antonio Bowen, male    DOB: 1946/02/11, 76 y.o.   MRN: [id number]  HPI: DEVARIUS NELLES is a 76 y.o. male presenting on 02/15/2022 for Medical Management of Chronic Issues, Hypothyroidism, and Knee Pain (Requesting injection)   HPI Left knee osteoarthritis Patient has left knee osteoarthritis is coming in today because he wants to redo an injection.  He says the last injection gave him 6 months of relief and did really well and he wants to do another one today to see if he can get the same amount of relief.  He will come back for his vaccinations  Hypothyroidism recheck Patient is coming in for thyroid recheck today as well. They deny any issues with hair changes or heat or cold problems or diarrhea or constipation. They deny any chest pain or palpitations. They are currently on levothyroxine 137 micrograms   COPD Patient is coming in for COPD recheck today.  He is currently on Symbicort and albuterol.  He has a mild chronic cough but denies any major coughing spells or wheezing spells.  He has 5nighttime symptoms per week and 5daytime symptoms per week currently.  Patient has a pulmonology appointment coming up in a few weeks.  He feels like he has not been doing as well with this time year  Hypertension and CKD 3 Patient is currently on losartan hydrochlorothiazide, and their blood pressure today is 132/77. Patient denies any lightheadedness or dizziness. Patient denies headaches, blurred vision, chest pains, shortness of breath, or weakness. Denies any side effects from medication and is content with current medication.   Hyperlipidemia Patient is coming in for recheck of his hyperlipidemia. The patient is currently taking no medication currently, has been intolerant. They deny any issues with myalgias or history of liver damage  from it. They deny any focal numbness or weakness or chest pain.   Relevant past medical, surgical, family and social history reviewed and updated as indicated. Interim medical history since our last visit reviewed. Allergies and medications reviewed and updated.  Review of Systems  Constitutional:  Negative for chills and fever.  Eyes:  Negative for visual disturbance.  Respiratory:  Negative for shortness of breath and wheezing.   Cardiovascular:  Negative for chest pain and leg swelling.  Musculoskeletal:  Negative for back pain and gait problem.  Skin:  Negative for rash.  Neurological:  Negative for dizziness and weakness.  All other systems reviewed and are negative.   Per HPI unless specifically indicated above   Allergies as of 02/15/2022       Reactions   Nuts Nausea And Vomiting   sweating   Other Nausea And Vomiting   Per patient, cannot take any narcotic. Nausea and vomiting sweating   Vicodin [hydrocodone-acetaminophen]    tachycardia   Codeine Nausea And Vomiting   Aspirin Other (See Comments)   Upset stomach, bleeding   Penicillins Hives, Rash   Has patient had a PCN reaction causing immediate rash, facial/tongue/throat swelling, SOB or lightheadedness with hypotension:No Has patient had a PCN reaction causing severe rash involving mucus membranes or skin necrosis:No Has patient had a PCN reaction that required hospitalization:No Has patient had a PCN reaction occurring within the last 10 years:Yes If all of  the above answers are "NO", then may proceed with Cephalosporin use.        Medication List        Accurate as of February 15, 2022  1:34 PM. If you have any questions, ask your nurse or doctor.          acetaminophen 500 MG tablet Commonly known as: TYLENOL Take 1,000 mg by mouth 2 (two) times daily as needed (for pain.).   albuterol 108 (90 Base) MCG/ACT inhaler Commonly known as: Ventolin HFA Inhale 1-2 puffs into the lungs every 4 (four)  hours as needed for wheezing or shortness of breath. INHALE TWO PUFFS INTO LUNGS EVERY 6 HOURS AS NEEDED FOR WHEEZING   budesonide-formoterol 160-4.5 MCG/ACT inhaler Commonly known as: Symbicort Inhale 2 puffs into the lungs 2 (two) times daily. in the morning and at bedtime.   cetirizine 10 MG tablet Commonly known as: ZYRTEC Take 10 mg by mouth at bedtime.   clopidogrel 75 MG tablet Commonly known as: PLAVIX TAKE 1 TABLET EVERY DAY   fluticasone 50 MCG/ACT nasal spray Commonly known as: FLONASE USE 1 SPRAY IN EACH NOSTRIL TWICE DAILY AS NEEDED FOR ALLERGIES OR RHINITIS What changed: See the new instructions.   LEG CRAMP RELIEF PO Take 2 tablets by mouth at bedtime. [place] leg cramp   levothyroxine 137 MCG tablet Commonly known as: SYNTHROID Take 1 tablet (137 mcg total) by mouth daily before breakfast. What changed:  medication strength how much to take Another medication with the same name was removed. Continue taking this medication, and follow the directions you see here. Changed by: Worthy Rancher, MD   losartan-hydrochlorothiazide 50-12.5 MG tablet Commonly known as: HYZAAR Take 1 tablet by mouth daily.   magnesium oxide 400 MG tablet Commonly known as: MAG-OX Take 400 mg by mouth at bedtime.   meloxicam 15 MG tablet Commonly known as: MOBIC Take 1 tablet (15 mg total) by mouth daily.   omeprazole 40 MG capsule Commonly known as: PRILOSEC Take 1 capsule (40 mg total) by mouth in the morning and at bedtime.   OXYGEN Inhale 3 L into the lungs at bedtime.   SUPER B COMPLEX/C PO Take 1 capsule by mouth at bedtime.   traMADol 50 MG tablet Commonly known as: ULTRAM Take 100 mg by mouth 3 (three) times daily.   traZODone 50 MG tablet Commonly known as: DESYREL Take 0.5-1 tablets (25-50 mg total) by mouth at bedtime as needed for sleep. What changed: how much to take         Objective:   BP 132/77   Pulse 95   Temp (!) 97.4 F (36.3 C)   Ht  '5\' 9"'$  (1.753 m)   Wt 200 lb (90.7 kg)   SpO2 97%   BMI 29.53 kg/m   Wt Readings from Last 3 Encounters:  02/15/22 200 lb (90.7 kg)  01/30/22 199 lb 15.3 oz (90.7 kg)  11/29/21 197 lb 2 oz (89.4 kg)    Physical Exam Vitals and nursing note reviewed.  Constitutional:      General: He is not in acute distress.    Appearance: He is well-developed. He is not diaphoretic.  Eyes:     General: No scleral icterus.    Conjunctiva/sclera: Conjunctivae normal.  Neck:     Thyroid: No thyromegaly.  Cardiovascular:     Rate and Rhythm: Normal rate and regular rhythm.     Heart sounds: Normal heart sounds. No murmur heard. Pulmonary:  Effort: Pulmonary effort is normal. No respiratory distress.     Breath sounds: Normal breath sounds. No wheezing.  Musculoskeletal:        General: No swelling. Normal range of motion.     Cervical back: Neck supple.  Lymphadenopathy:     Cervical: No cervical adenopathy.  Skin:    General: Skin is warm and dry.     Findings: No rash.  Neurological:     Mental Status: He is alert and oriented to person, place, and time.     Coordination: Coordination normal.  Psychiatric:        Behavior: Behavior normal.     Knee injection: Consent form signed. Risk factors of bleeding and infection discussed with patient and patient is agreeable towards injection. Patient prepped with Betadine. Lateral approach towards injection used. Injected 80 mg of Depo-Medrol and 1 mL of 2% lidocaine. Patient tolerated procedure well and no side effects from noted. Minimal to no bleeding. Simple bandage applied after.   Assessment & Plan:   Problem List Items Addressed This Visit       Cardiovascular and Mediastinum   Hypertension   Relevant Medications   losartan-hydrochlorothiazide (HYZAAR) 50-12.5 MG tablet     Endocrine   Hypothyroidism (Chronic)   Relevant Medications   levothyroxine (SYNTHROID) 137 MCG tablet     Genitourinary   Chronic kidney disease (CKD),  stage III (moderate) (HCC)     Other   Hyperlipidemia LDL goal <130 - Primary   Relevant Medications   losartan-hydrochlorothiazide (HYZAAR) 50-12.5 MG tablet   Other Visit Diagnoses     Essential hypertension       Relevant Medications   losartan-hydrochlorothiazide (HYZAAR) 50-12.5 MG tablet   Primary osteoarthritis of left knee       Relevant Medications   meloxicam (MOBIC) 15 MG tablet   methylPREDNISolone acetate (DEPO-MEDROL) injection 80 mg (Start on 02/15/2022  1:45 PM)       Continue current medicine, no changes, will do knee injection Follow up plan: Return in about 6 months (around 08/17/2022), or if symptoms worsen or fail to improve, for Review hypertension and hyperlipidemia and hypothyroidism.  Counseling provided for all of the vaccine components No orders of the defined types were placed in this encounter.   Caryl Pina, MD [facility] 02/15/2022, 1:34 PM

## 2022-02-15 NOTE — Addendum Note (Signed)
Addended by: Caryl Pina on: 02/15/2022 02:01 PM   Modules accepted: Orders

## 2022-02-15 NOTE — Telephone Encounter (Signed)
REFERRAL REQUEST Telephone Note  Have you been seen at our office for this problem? YES (Advise that they may need an appointment with their PCP before a referral can be done)  Reason for Referral: COPD - Appt Nov 6th and needs a referral. Referral discussed with patient: YES  Best contact number of patient for referral team: [phone]    Has patient been seen by a specialist for this issue before: YES  Patient provider preference for referral: Freddi Starr Patient location preference for referral: [facility]   Patient notified that referrals can take up to a week or longer to process. If they haven't heard anything within a week they should call back and speak with the referral department.

## 2022-02-16 LAB — CBC WITH DIFFERENTIAL/PLATELET
Basophils Absolute: 0.1 10*3/uL (ref 0.0–0.2)
Basos: 0 %
EOS (ABSOLUTE): 0.1 10*3/uL (ref 0.0–0.4)
Eos: 0 %
Hematocrit: 44.6 % (ref 37.5–51.0)
Hemoglobin: 15.2 g/dL (ref 13.0–17.7)
Immature Grans (Abs): 0.1 10*3/uL (ref 0.0–0.1)
Immature Granulocytes: 1 %
Lymphocytes Absolute: 2.2 10*3/uL (ref 0.7–3.1)
Lymphs: 17 %
MCH: 31 pg (ref 26.6–33.0)
MCHC: 34.1 g/dL (ref 31.5–35.7)
MCV: 91 fL (ref 79–97)
Monocytes Absolute: 0.8 10*3/uL (ref 0.1–0.9)
Monocytes: 6 %
Neutrophils Absolute: 9.7 10*3/uL — ABNORMAL HIGH (ref 1.4–7.0)
Neutrophils: 76 %
Platelets: 210 10*3/uL (ref 150–450)
RBC: 4.9 x10E6/uL (ref 4.14–5.80)
RDW: 12.7 % (ref 11.6–15.4)
WBC: 12.9 10*3/uL — ABNORMAL HIGH (ref 3.4–10.8)

## 2022-02-16 LAB — LIPID PANEL
Chol/HDL Ratio: 1.9 ratio (ref 0.0–5.0)
Cholesterol, Total: 96 mg/dL — ABNORMAL LOW (ref 100–199)
HDL: 50 mg/dL (ref >39)
LDL Chol Calc (NIH): 24 mg/dL (ref 0–99)
Triglycerides: 128 mg/dL (ref 0–149)
VLDL Cholesterol Cal: 22 mg/dL (ref 5–40)

## 2022-02-16 LAB — CMP14+EGFR
ALT: 18 IU/L (ref 0–44)
AST: 18 IU/L (ref 0–40)
Albumin/Globulin Ratio: 1.9 (ref 1.2–2.2)
Albumin: 4.2 g/dL (ref 3.8–4.8)
Alkaline Phosphatase: 81 IU/L (ref 44–121)
BUN/Creatinine Ratio: 12 (ref 10–24)
BUN: 25 mg/dL (ref 8–27)
Bilirubin Total: 0.4 mg/dL (ref 0.0–1.2)
CO2: 22 mmol/L (ref 20–29)
Calcium: 9.3 mg/dL (ref 8.6–10.2)
Chloride: 104 mmol/L (ref 96–106)
Creatinine, Ser: 2.03 mg/dL — ABNORMAL HIGH (ref 0.76–1.27)
Globulin, Total: 2.2 g/dL (ref 1.5–4.5)
Glucose: 117 mg/dL — ABNORMAL HIGH (ref 70–99)
Potassium: 4.1 mmol/L (ref 3.5–5.2)
Sodium: 140 mmol/L (ref 134–144)
Total Protein: 6.4 g/dL (ref 6.0–8.5)
eGFR: 33 mL/min/{1.73_m2} — ABNORMAL LOW (ref >59)

## 2022-02-16 LAB — TSH: TSH: 0.968 u[IU]/mL (ref 0.450–4.500)

## 2022-02-16 NOTE — Telephone Encounter (Signed)
Referral placed.

## 2022-02-16 NOTE — Telephone Encounter (Signed)
Pt's wife made aware. 

## 2022-02-19 ENCOUNTER — Ambulatory Visit (INDEPENDENT_AMBULATORY_CARE_PROVIDER_SITE_OTHER): Payer: Medicare HMO

## 2022-02-19 DIAGNOSIS — I639 Cerebral infarction, unspecified: Secondary | ICD-10-CM

## 2022-02-20 LAB — CUP PACEART REMOTE DEVICE CHECK
Date Time Interrogation Session: 20231022231328
Implantable Pulse Generator Implant Date: 20210708

## 2022-02-23 ENCOUNTER — Telehealth: Payer: Self-pay | Admitting: Family Medicine

## 2022-02-23 NOTE — Telephone Encounter (Signed)
Pt has been made aware off all results. Discussed reasons for why is WBC could be elevated. He will call the office back if he has any concerns.

## 2022-03-05 ENCOUNTER — Encounter: Payer: Self-pay | Admitting: Pulmonary Disease

## 2022-03-05 ENCOUNTER — Ambulatory Visit: Payer: Medicare HMO | Admitting: Pulmonary Disease

## 2022-03-05 VITALS — BP 116/74 | HR 88 | Temp 99.0°F | Ht 69.0 in | Wt 201.2 lb

## 2022-03-05 DIAGNOSIS — J449 Chronic obstructive pulmonary disease, unspecified: Secondary | ICD-10-CM

## 2022-03-05 NOTE — Progress Notes (Unsigned)
Synopsis: Referred in November 2023 for COPD by Caryl Pina, MD  Subjective:   PATIENT ID: Antonio Bowen GENDER: male DOB: Aug 29, 1945, MRN: [id number]  HPI  Chief Complaint  Patient presents with   Consult    SOB, 3L O2 at night PT states COPD, has had 4 Strokes (last in July of 2021), BOOP   Antonio Bowen is a 76 year old male, former smoker with chronic hypoxemic respiratory failure, BOOP, GERD and hypertension who is referred to pulmonary clinic for COPD.   He reports significant dyspnea with brushing his teeth, showering or getting dressed in the mornings. He is using 3L of oxygen at night and occasionally during the day when he has severe shortness of breath. He uses a fan most of the day that blows on his face that provides relief. He has a hard time breathing during hot weather.   He is currently using symbicort 160-4.108mg 2 puffs twice daily. He has tried trelegy/breztri in the past without much more relief.   He has sinus congestion and drainage where he is using netty pot rinses daily.   He has intermittent productive cough. He was on prednisone in July and reports he felt significantly better during that time. He typically feels worse a couple days after steroid tapers are completed.   He has some seasonal allergies and is taking zyrtec and flonase daily.   He smoked for 40+ years and quit in 2014. 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 He grew up with second hand smoke in the home. He reports cutting some wood the other day and that seemed to aggravate his breathing due to the dust. He lives with his wife. He used to play golf but is unable now due to his breathing.   No family history of lung cancer.    Past Medical History:  Diagnosis Date   Allergic rhinitis    Arthritis    BOOP (bronchiolitis obliterans with organizing pneumonia)  ([facility]    Bronchitis    Colon polyps    COPD (chronic obstructive pulmonary disease) (HCC)    Dyspnea    with exertion    Dysrhythmia    skipped beats- followed by Dr MAngelena Form   GERD (gastroesophageal reflux disease)    Hypertension    Hypothyroidism    Kidney stone    Low back pain    Oxygen dependent    uses 3L/[place] at nite occasional during day if needed    Stroke ([facility]    hx of 2 strokes which patient did not know he had had    Thyroid disease    Tobacco dependence      Family History  Problem Relation Age of Onset   Arthritis Father    Stroke Father 726  Prostate cancer Maternal Grandfather    Stomach cancer Maternal Grandmother    Cancer Paternal Grandmother    Colon cancer Other        mat 1st cousin   Cancer Mother    Healthy Daughter    Healthy Son    Pancreatic cancer Neg Hx    Esophageal cancer Neg Hx      Social History   Socioeconomic History   Marital status: Married    Spouse name: [name]  Number of children: 2   Years of education: 11   Highest education  level: 11th grade  Occupational History   Occupation: Retired    Comment: [employer], 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, also worked in [place] Use   Smoking status: Former    Packs/day: 1.00    Years: 50.00    Total pack years: 50.00    Types: Cigarettes    Quit date: 07/30/2010    Years since quitting: 11.6   Smokeless tobacco: Current    Types: Snuff  Vaping Use   Vaping Use: Never used  Substance and Sexual Activity   Alcohol use: Yes    Comment: rare   Drug use: No   Sexual activity: Not on file  Other Topics Concern   Not on file  Social History Narrative   Not on file   Social Determinants of Health   Financial Resource Strain: Low Risk  (07/06/2021)   Overall Financial Resource Strain (CARDIA)    Difficulty of Paying Living Expenses: Not hard at all  Food Insecurity: No Food Insecurity (07/06/2021)   Hunger Vital Sign    Worried About Running Out of Food in the Last Year:  Never true    [place] in the Last Year: Never true  Transportation Needs: No Transportation Needs (07/06/2021)   PRAPARE - Hydrologist (Medical): No    Lack of Transportation (Non-Medical): No  Physical Activity: Insufficiently Active (07/06/2021)   Exercise Vital Sign    Days of Exercise per Week: 7 days    Minutes of Exercise per Session: 10 min  Stress: No Stress Concern Present (07/06/2021)   [place]    Feeling of Stress : Only a little  Social Connections: Moderately Isolated (07/06/2021)   Social Connection and Isolation Panel [NHANES]    Frequency of Communication with Friends and Family: More than three times a week    Frequency of Social Gatherings with Friends and Family: More than three times a week    Attends Religious Services: Never    Marine scientist or Organizations: No    Attends Archivist Meetings: Never    Marital Status: Married  Human resources officer Violence: Not At Risk (07/06/2021)   Humiliation, Afraid, Rape, and Kick questionnaire    Fear of Current or Ex-Partner: No    Emotionally Abused: No    Physically Abused: No    Sexually Abused: No     Allergies  Allergen Reactions   Nuts Nausea And Vomiting    sweating   Other Nausea And Vomiting    Per patient, cannot take any narcotic. Nausea and vomiting sweating   Vicodin [Hydrocodone-Acetaminophen]     tachycardia   Codeine Nausea And Vomiting   Aspirin Other (See Comments)    Upset stomach, bleeding   Penicillins Hives and Rash    Has patient had a PCN reaction causing immediate rash, facial/tongue/throat swelling, SOB or lightheadedness with hypotension:No Has patient had a PCN reaction causing severe rash involving mucus membranes or skin necrosis:No Has patient had a PCN reaction that required hospitalization:No Has patient had a PCN reaction occurring within the last 10 years:Yes If all  of the above answers are "NO", then may proceed with Cephalosporin use.      Outpatient Medications Prior to Visit  Medication Sig Dispense Refill   acetaminophen (TYLENOL) 500 MG tablet Take 1,000 mg by mouth 2 (two) times daily as needed (for pain.).     albuterol (VENTOLIN HFA) 108 (90  Base) MCG/ACT inhaler Inhale 1-2 puffs into the lungs every 4 (four) hours as needed for wheezing or shortness of breath. INHALE TWO PUFFS INTO LUNGS EVERY 6 HOURS AS NEEDED FOR WHEEZING 18 g 3   budesonide-formoterol (SYMBICORT) 160-4.5 MCG/ACT inhaler Inhale 2 puffs into the lungs 2 (two) times daily. in the morning and at bedtime. 30.6 g 3   cetirizine (ZYRTEC) 10 MG tablet Take 10 mg by mouth at bedtime.      clopidogrel (PLAVIX) 75 MG tablet TAKE 1 TABLET EVERY DAY 90 tablet 3   fluticasone (FLONASE) 50 MCG/ACT nasal spray USE 1 SPRAY IN EACH NOSTRIL TWICE DAILY AS NEEDED FOR ALLERGIES OR RHINITIS (Patient taking differently: Place 2 sprays into both nostrils 2 (two) times daily.) 48 g 3   Homeopathic Products (LEG CRAMP RELIEF PO) Take 2 tablets by mouth at bedtime. [place] leg cramp     levothyroxine (SYNTHROID) 137 MCG tablet Take 1 tablet (137 mcg total) by mouth daily before breakfast. 90 tablet 3   losartan-hydrochlorothiazide (HYZAAR) 50-12.5 MG tablet Take 1 tablet by mouth daily. 90 tablet 3   magnesium oxide (MAG-OX) 400 MG tablet Take 400 mg by mouth at bedtime.      meloxicam (MOBIC) 15 MG tablet Take 1 tablet (15 mg total) by mouth daily. 90 tablet 3   omeprazole (PRILOSEC) 40 MG capsule Take 1 capsule (40 mg total) by mouth in the morning and at bedtime. 180 capsule 3   OXYGEN Inhale 3 L into the lungs at bedtime.     SUPER B COMPLEX/C PO Take 1 capsule by mouth at bedtime.     traMADol (ULTRAM) 50 MG tablet Take 100 mg by mouth 3 (three) times daily.     traZODone (DESYREL) 50 MG tablet Take 0.5-1 tablets (25-50 mg total) by mouth at bedtime as needed for sleep. 90 tablet 3   No  facility-administered medications prior to visit.    Review of Systems  Constitutional:  Negative for chills, fever, malaise/fatigue and weight loss.  HENT:  Negative for congestion, sinus pain and sore throat.   Eyes: Negative.   Respiratory:  Positive for cough, shortness of breath and wheezing. Negative for hemoptysis and sputum production.   Cardiovascular:  Negative for chest pain, palpitations, orthopnea, claudication and leg swelling.  Gastrointestinal:  Negative for abdominal pain, heartburn, nausea and vomiting.  Genitourinary: Negative.   Musculoskeletal:  Negative for joint pain and myalgias.  Skin:  Negative for rash.  Neurological:  Negative for weakness.  Endo/Heme/Allergies: Negative.   Psychiatric/Behavioral: Negative.        Objective:   Vitals:   03/05/22 1604  BP: 116/74  Pulse: 88  Temp: 99 F (37.2 C)  TempSrc: Oral  SpO2: 98%  Weight: 201 lb 3.2 oz (91.3 kg)  Height: '5\' 9"'$  (1.753 m)     Physical Exam Constitutional:      General: He is not in acute distress. HENT:     Head: Normocephalic and atraumatic.  Eyes:     Extraocular Movements: Extraocular movements intact.     Conjunctiva/sclera: Conjunctivae normal.     Pupils: Pupils are equal, round, and reactive to light.  Cardiovascular:     Rate and Rhythm: Normal rate and regular rhythm.     Pulses: Normal pulses.     Heart sounds: Normal heart sounds. No murmur heard. Pulmonary:     Breath sounds: Decreased air movement present.  Abdominal:     General: Bowel sounds are normal.  Palpations: Abdomen is soft.  Musculoskeletal:     Right lower leg: No edema.     Left lower leg: No edema.  Lymphadenopathy:     Cervical: No cervical adenopathy.  Skin:    General: Skin is warm and dry.  Neurological:     General: No focal deficit present.     Mental Status: He is alert.  Psychiatric:        Mood and Affect: Mood normal.        Behavior: Behavior normal.        Thought Content:  Thought content normal.        Judgment: Judgment normal.    CBC    Component Value Date/Time   WBC 12.9 (H) 02/15/2022 1447   WBC 10.9 (H) 11/03/2019 1140   RBC 4.90 02/15/2022 1447   RBC 5.42 11/03/2019 1140   HGB 15.2 02/15/2022 1447   HCT 44.6 02/15/2022 1447   PLT 210 02/15/2022 1447   MCV 91 02/15/2022 1447   MCH 31.0 02/15/2022 1447   MCH 32.3 11/03/2019 1140   MCHC 34.1 02/15/2022 1447   MCHC 34.0 11/03/2019 1140   RDW 12.7 02/15/2022 1447   LYMPHSABS 2.2 02/15/2022 1447   MONOABS 0.6 11/03/2019 1140   EOSABS 0.1 02/15/2022 1447   BASOSABS 0.1 02/15/2022 1447      Latest Ref Rng & Units 02/15/2022    2:47 PM 08/14/2021    2:43 PM 02/13/2021    2:47 PM  BMP  Glucose 70 - 99 mg/dL 117  95  106   BUN 8 - 27 mg/dL '25  21  17   '$ Creatinine 0.76 - 1.27 mg/dL 2.03  1.96  2.03   BUN/Creat Ratio 10 - '24 12  11  8   '$ Sodium 134 - 144 mmol/L 140  141  142   Potassium 3.5 - 5.2 mmol/L 4.1  4.9  4.8   Chloride 96 - 106 mmol/L 104  106  108   CO2 20 - 29 mmol/L '22  21  18   '$ Calcium 8.6 - 10.2 mg/dL 9.3  9.2  9.7     Chest imaging: CXR 07/2019 The heart size and mediastinal contours are within normal limits. Unchanged scarring of the upper lungs. The visualized skeletal structures are unremarkable.  PFT:     No data to display          Labs:  Path:  Echo:  Heart Catheterization:  Assessment & Plan:   Chronic obstructive pulmonary disease, unspecified COPD type ([place]) - Plan: Pulmonary Function Test, CBC with Differential, IgE, Pulse oximetry, overnight  Discussion: Antonio Bowen is a 76 year old male, former smoker with chronic hypoxemic respiratory failure, BOOP, GERD and hypertension who is referred to pulmonary clinic for COPD.   We will obtain updated pulmonary function tests and CT Chest scan.  We will check IgE and CBC with differential.  We will evaluate for possible asthma-COPD overlap syndrome with the above testing. If there is concern for  this then we will work towards treating with biologic therapy as he seems to respond to prednisone well.   He is to continue symbicort at this time. We may need to consider transition to a nebulizer treatment regimen in the future. He reports this was very costly for him when tried in the past. We will verify with our pharmacy team before hand.  Follow up in 1 month.    Current Outpatient Medications:    acetaminophen (TYLENOL) 500 MG tablet,  Take 1,000 mg by mouth 2 (two) times daily as needed (for pain.)., Disp: , Rfl:    albuterol (VENTOLIN HFA) 108 (90 Base) MCG/ACT inhaler, Inhale 1-2 puffs into the lungs every 4 (four) hours as needed for wheezing or shortness of breath. INHALE TWO PUFFS INTO LUNGS EVERY 6 HOURS AS NEEDED FOR WHEEZING, Disp: 18 g, Rfl: 3   budesonide-formoterol (SYMBICORT) 160-4.5 MCG/ACT inhaler, Inhale 2 puffs into the lungs 2 (two) times daily. in the morning and at bedtime., Disp: 30.6 g, Rfl: 3   cetirizine (ZYRTEC) 10 MG tablet, Take 10 mg by mouth at bedtime. , Disp: , Rfl:    clopidogrel (PLAVIX) 75 MG tablet, TAKE 1 TABLET EVERY DAY, Disp: 90 tablet, Rfl: 3   fluticasone (FLONASE) 50 MCG/ACT nasal spray, USE 1 SPRAY IN EACH NOSTRIL TWICE DAILY AS NEEDED FOR ALLERGIES OR RHINITIS (Patient taking differently: Place 2 sprays into both nostrils 2 (two) times daily.), Disp: 48 g, Rfl: 3   Homeopathic Products (LEG CRAMP RELIEF PO), Take 2 tablets by mouth at bedtime. [place] leg cramp, Disp: , Rfl:    levothyroxine (SYNTHROID) 137 MCG tablet, Take 1 tablet (137 mcg total) by mouth daily before breakfast., Disp: 90 tablet, Rfl: 3   losartan-hydrochlorothiazide (HYZAAR) 50-12.5 MG tablet, Take 1 tablet by mouth daily., Disp: 90 tablet, Rfl: 3   magnesium oxide (MAG-OX) 400 MG tablet, Take 400 mg by mouth at bedtime. , Disp: , Rfl:    meloxicam (MOBIC) 15 MG tablet, Take 1 tablet (15 mg total) by mouth daily., Disp: 90 tablet, Rfl: 3   omeprazole (PRILOSEC) 40 MG capsule,  Take 1 capsule (40 mg total) by mouth in the morning and at bedtime., Disp: 180 capsule, Rfl: 3   OXYGEN, Inhale 3 L into the lungs at bedtime., Disp: , Rfl:    SUPER B COMPLEX/C PO, Take 1 capsule by mouth at bedtime., Disp: , Rfl:    traMADol (ULTRAM) 50 MG tablet, Take 100 mg by mouth 3 (three) times daily., Disp: , Rfl:    traZODone (DESYREL) 50 MG tablet, Take 0.5-1 tablets (25-50 mg total) by mouth at bedtime as needed for sleep., Disp: 90 tablet, Rfl: 3

## 2022-03-05 NOTE — Patient Instructions (Addendum)
We will check breathing tests at follow up visit  We will schedule you for a high resolution CT Chest scan  We will check labs today, if not available today will check at follow up visit  Continue Symbicort 2 puffs twice daily - rinse mouth out after each visit  Follow up in 1 month

## 2022-03-07 ENCOUNTER — Encounter: Payer: Self-pay | Admitting: Pulmonary Disease

## 2022-03-08 ENCOUNTER — Ambulatory Visit (INDEPENDENT_AMBULATORY_CARE_PROVIDER_SITE_OTHER): Payer: Medicare HMO | Admitting: Pulmonary Disease

## 2022-03-08 ENCOUNTER — Other Ambulatory Visit (INDEPENDENT_AMBULATORY_CARE_PROVIDER_SITE_OTHER): Payer: Medicare HMO

## 2022-03-08 ENCOUNTER — Ambulatory Visit (INDEPENDENT_AMBULATORY_CARE_PROVIDER_SITE_OTHER): Payer: Medicare HMO

## 2022-03-08 DIAGNOSIS — Z23 Encounter for immunization: Secondary | ICD-10-CM

## 2022-03-08 DIAGNOSIS — J449 Chronic obstructive pulmonary disease, unspecified: Secondary | ICD-10-CM

## 2022-03-08 LAB — PULMONARY FUNCTION TEST
DL/VA % pred: 71 %
DL/VA: 2.83 ml/min/mmHg/L
DLCO cor % pred: 83 %
DLCO cor: 19.66 ml/min/mmHg
DLCO unc % pred: 83 %
DLCO unc: 19.66 ml/min/mmHg
FEF 25-75 Post: 2.1 L/sec
FEF 25-75 Pre: 2.15 L/sec
FEF2575-%Change-Post: -2 %
FEF2575-%Pred-Post: 105 %
FEF2575-%Pred-Pre: 107 %
FEV1-%Change-Post: -1 %
FEV1-%Pred-Post: 112 %
FEV1-%Pred-Pre: 113 %
FEV1-Post: 3.11 L
FEV1-Pre: 3.16 L
FEV1FVC-%Change-Post: -3 %
FEV1FVC-%Pred-Pre: 98 %
FEV6-%Change-Post: 2 %
FEV6-%Pred-Post: 123 %
FEV6-%Pred-Pre: 120 %
FEV6-Post: 4.44 L
FEV6-Pre: 4.34 L
FEV6FVC-%Change-Post: 0 %
FEV6FVC-%Pred-Post: 104 %
FEV6FVC-%Pred-Pre: 105 %
FVC-%Change-Post: 2 %
FVC-%Pred-Post: 117 %
FVC-%Pred-Pre: 114 %
FVC-Post: 4.53 L
FVC-Pre: 4.42 L
Post FEV1/FVC ratio: 69 %
Post FEV6/FVC ratio: 98 %
Pre FEV1/FVC ratio: 72 %
Pre FEV6/FVC Ratio: 98 %
RV % pred: 134 %
RV: 3.32 L
TLC % pred: 119 %
TLC: 7.93 L

## 2022-03-08 LAB — CBC WITH DIFFERENTIAL/PLATELET
Basophils Absolute: 0 10*3/uL (ref 0.0–0.1)
Basophils Relative: 0.2 % (ref 0.0–3.0)
Eosinophils Absolute: 0.1 10*3/uL (ref 0.0–0.7)
Eosinophils Relative: 0.9 % (ref 0.0–5.0)
HCT: 44.6 % (ref 39.0–52.0)
Hemoglobin: 14.9 g/dL (ref 13.0–17.0)
Lymphocytes Relative: 34.5 % (ref 12.0–46.0)
Lymphs Abs: 4.2 10*3/uL — ABNORMAL HIGH (ref 0.7–4.0)
MCHC: 33.3 g/dL (ref 30.0–36.0)
MCV: 93.6 fl (ref 78.0–100.0)
Monocytes Absolute: 0.7 10*3/uL (ref 0.1–1.0)
Monocytes Relative: 5.7 % (ref 3.0–12.0)
Neutro Abs: 7.2 10*3/uL (ref 1.4–7.7)
Neutrophils Relative %: 58.7 % (ref 43.0–77.0)
Platelets: 176 10*3/uL (ref 150.0–400.0)
RBC: 4.77 Mil/uL (ref 4.22–5.81)
RDW: 14 % (ref 11.5–15.5)
WBC: 12.3 10*3/uL — ABNORMAL HIGH (ref 4.0–10.5)

## 2022-03-08 NOTE — Progress Notes (Signed)
Shingrix vaccine given to left deltoid.  Influenza vaccine given to right deltoid.  Patient tolerated well.

## 2022-03-08 NOTE — Progress Notes (Signed)
PFT done today. 

## 2022-03-12 LAB — IGE: IgE (Immunoglobulin E), Serum: 14 kU/L (ref <=114)

## 2022-03-16 NOTE — Progress Notes (Signed)
Carelink Summary Report / Loop Recorder 

## 2022-03-19 ENCOUNTER — Ambulatory Visit (HOSPITAL_COMMUNITY)
Admission: RE | Admit: 2022-03-19 | Discharge: 2022-03-19 | Disposition: A | Discharge status: Home / Self Care | Payer: Medicare HMO | Source: Ambulatory Visit | Attending: Pulmonary Disease | Admitting: Pulmonary Disease

## 2022-03-19 DIAGNOSIS — S2241XA Multiple fractures of ribs, right side, initial encounter for closed fracture: Secondary | ICD-10-CM | POA: Diagnosis not present

## 2022-03-19 DIAGNOSIS — J84112 Idiopathic pulmonary fibrosis: Secondary | ICD-10-CM | POA: Diagnosis not present

## 2022-03-19 DIAGNOSIS — J479 Bronchiectasis, uncomplicated: Secondary | ICD-10-CM | POA: Diagnosis not present

## 2022-03-19 DIAGNOSIS — J449 Chronic obstructive pulmonary disease, unspecified: Secondary | ICD-10-CM | POA: Insufficient documentation

## 2022-03-19 DIAGNOSIS — J432 Centrilobular emphysema: Secondary | ICD-10-CM | POA: Diagnosis not present

## 2022-03-26 ENCOUNTER — Ambulatory Visit (INDEPENDENT_AMBULATORY_CARE_PROVIDER_SITE_OTHER): Payer: Medicare HMO

## 2022-03-26 DIAGNOSIS — I639 Cerebral infarction, unspecified: Secondary | ICD-10-CM | POA: Diagnosis not present

## 2022-03-27 LAB — CUP PACEART REMOTE DEVICE CHECK
Date Time Interrogation Session: 20231126232011
Implantable Pulse Generator Implant Date: 20210708

## 2022-04-04 ENCOUNTER — Encounter: Payer: Self-pay | Admitting: Pulmonary Disease

## 2022-04-04 ENCOUNTER — Ambulatory Visit: Payer: Medicare HMO | Admitting: Pulmonary Disease

## 2022-04-04 VITALS — BP 118/70 | HR 80 | Ht 69.0 in | Wt 204.8 lb

## 2022-04-04 DIAGNOSIS — J449 Chronic obstructive pulmonary disease, unspecified: Secondary | ICD-10-CM | POA: Diagnosis not present

## 2022-04-04 DIAGNOSIS — G4734 Idiopathic sleep related nonobstructive alveolar hypoventilation: Secondary | ICD-10-CM

## 2022-04-04 NOTE — Progress Notes (Signed)
Synopsis: Referred in November 2023 for COPD by Caryl Pina, MD  Subjective:   PATIENT ID: Antonio Bowen GENDER: male DOB: [birth year], MRN: [id number]  HPI  Chief Complaint  Patient presents with   Follow-up   Antonio Bowen is a 76 year old male, former smoker with chronic hypoxemic respiratory failure, BOOP, GERD and hypertension who returns to pulmonary clinic for COPD.   HRCT Chest 03/19/22 shows severe centrilobular and paraseptal emphysema. Subpleural scarring of right middle lobe. Scattered areas of subpleural ground glass, right greater than left. Overall, unchanged from CT Chest in 2021.   PFTs show mild obstruction, no significant bronchodilator response, air trapping and normal DLCO.   Initial OV 03/05/22 He reports significant dyspnea with brushing his teeth, showering or getting dressed in the mornings. He is using 3L of oxygen at night and occasionally during the day when he has severe shortness of breath. He uses a fan most of the day that blows on his face that provides relief. He has a hard time breathing during hot weather.   He is currently using symbicort 160-4.37mg 2 puffs twice daily. He has tried trelegy/breztri in the past without much more relief.   He has sinus congestion and drainage where he is using netty pot rinses daily.   He has intermittent productive cough. He was on prednisone in July and reports he felt significantly better during that time. He typically feels worse a couple days after steroid tapers are completed.   He has some seasonal allergies and is taking zyrtec and flonase daily.   He smoked for 40+ years and quit in 2014. 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 He grew up with second hand smoke in the home. He reports cutting some wood the other day and that seemed to aggravate his breathing due to the dust. He  lives with his wife. He used to play golf but is unable now due to his breathing.   No family history of lung cancer.    Past Medical History:  Diagnosis Date   Allergic rhinitis    Arthritis    BOOP ([place]chiolitis [place]ans with organizing pneumonia) ([place]    Bronchitis    Colon polyps    COPD (chronic obstructive pulmonary disease) ([place]    Dyspnea    with exertion    Dysrhythmia    skipped beats- followed by Dr MAngelena Form   GERD (gastroesophageal reflux disease)    Hypertension    Hypothyroidism    Kidney stone    Low back pain    Oxygen dependent    uses 3L/[place] at nite occasional during day if needed    Stroke ([facility]    hx of 2 strokes which patient did not know he had had    Thyroid disease    Tobacco dependence      Family History  Problem Relation Age of Onset   Arthritis Father    Stroke Father 739  Prostate cancer Maternal Grandfather    Stomach cancer Maternal Grandmother    Cancer Paternal Grandmother    Colon cancer Other        mat 1st cousin   Cancer Mother    Healthy Daughter    Healthy Son    Pancreatic cancer Neg Hx    Esophageal cancer Neg Hx      Social History  Socioeconomic History   Marital status: Married    Spouse name: Becky   Number of children: 2   Years of education: 11   Highest education level: 11th grade  Occupational History   Occupation: Retired    Comment: [employer], 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, also worked in [place] Use   Smoking status: Former    Packs/day: 1.00    Years: 50.00    Total pack years: 50.00    Types: Cigarettes    Quit date: 07/30/2010    Years since quitting: 11.7   Smokeless tobacco: Current    Types: Snuff  Vaping Use   Vaping Use: Never used  Substance and Sexual Activity   Alcohol use: Yes    Comment: rare   Drug use: No   Sexual activity: Not on file  Other Topics Concern   Not on file  Social History Narrative   Not on file   Social Determinants of Health   Financial  Resource Strain: Low Risk  (07/06/2021)   Overall Financial Resource Strain (CARDIA)    Difficulty of Paying Living Expenses: Not hard at all  Food Insecurity: No Food Insecurity (07/06/2021)   Hunger Vital Sign    Worried About Running Out of Food in the Last Year: Never true    [place] in the Last Year: Never true  Transportation Needs: No Transportation Needs (07/06/2021)   PRAPARE - Hydrologist (Medical): No    Lack of Transportation (Non-Medical): No  Physical Activity: Insufficiently Active (07/06/2021)   Exercise Vital Sign    Days of Exercise per Week: 7 days    Minutes of Exercise per Session: 10 min  Stress: No Stress Concern Present (07/06/2021)   [place]    Feeling of Stress : Only a little  Social Connections: Moderately Isolated (07/06/2021)   Social Connection and Isolation Panel [NHANES]    Frequency of Communication with Friends and Family: More than three times a week    Frequency of Social Gatherings with Friends and Family: More than three times a week    Attends Religious Services: Never    Marine scientist or Organizations: No    Attends Archivist Meetings: Never    Marital Status: Married  Human resources officer Violence: Not At Risk (07/06/2021)   Humiliation, Afraid, Rape, and Kick questionnaire    Fear of Current or Ex-Partner: No    Emotionally Abused: No    Physically Abused: No    Sexually Abused: No     Allergies  Allergen Reactions   Nuts Nausea And Vomiting    sweating   Other Nausea And Vomiting    Per patient, cannot take any narcotic. Nausea and vomiting sweating   Vicodin [Hydrocodone-Acetaminophen]     tachycardia   Codeine Nausea And Vomiting   Aspirin Other (See Comments)    Upset stomach, bleeding   Penicillins Hives and Rash    Has patient had a PCN reaction causing immediate rash, facial/tongue/throat swelling, SOB or  lightheadedness with hypotension:No Has patient had a PCN reaction causing severe rash involving mucus membranes or skin necrosis:No Has patient had a PCN reaction that required hospitalization:No Has patient had a PCN reaction occurring within the last 10 years:Yes If all of the above answers are "NO", then may proceed with Cephalosporin use.      Outpatient Medications Prior to Visit  Medication Sig Dispense Refill  acetaminophen (TYLENOL) 500 MG tablet Take 1,000 mg by mouth 2 (two) times daily as needed (for pain.).     albuterol (VENTOLIN HFA) 108 (90 Base) MCG/ACT inhaler Inhale 1-2 puffs into the lungs every 4 (four) hours as needed for wheezing or shortness of breath. INHALE TWO PUFFS INTO LUNGS EVERY 6 HOURS AS NEEDED FOR WHEEZING 18 g 3   budesonide-formoterol (SYMBICORT) 160-4.5 MCG/ACT inhaler Inhale 2 puffs into the lungs 2 (two) times daily. in the morning and at bedtime. 30.6 g 3   cetirizine (ZYRTEC) 10 MG tablet Take 10 mg by mouth at bedtime.      clopidogrel (PLAVIX) 75 MG tablet TAKE 1 TABLET EVERY DAY 90 tablet 3   fluticasone (FLONASE) 50 MCG/ACT nasal spray USE 1 SPRAY IN EACH NOSTRIL TWICE DAILY AS NEEDED FOR ALLERGIES OR RHINITIS (Patient taking differently: Place 2 sprays into both nostrils 2 (two) times daily.) 48 g 3   Homeopathic Products (LEG CRAMP RELIEF PO) Take 2 tablets by mouth at bedtime. [place] leg cramp     levothyroxine (SYNTHROID) 137 MCG tablet Take 1 tablet (137 mcg total) by mouth daily before breakfast. 90 tablet 3   losartan-hydrochlorothiazide (HYZAAR) 50-12.5 MG tablet Take 1 tablet by mouth daily. 90 tablet 3   magnesium oxide (MAG-OX) 400 MG tablet Take 400 mg by mouth at bedtime.      meloxicam (MOBIC) 15 MG tablet Take 1 tablet (15 mg total) by mouth daily. 90 tablet 3   omeprazole (PRILOSEC) 40 MG capsule Take 1 capsule (40 mg total) by mouth in the morning and at bedtime. 180 capsule 3   OXYGEN Inhale 3 L into the lungs at bedtime.      SUPER B COMPLEX/C PO Take 1 capsule by mouth at bedtime.     traMADol (ULTRAM) 50 MG tablet Take 100 mg by mouth 3 (three) times daily.     traZODone (DESYREL) 50 MG tablet Take 0.5-1 tablets (25-50 mg total) by mouth at bedtime as needed for sleep. 90 tablet 3   No facility-administered medications prior to visit.    Review of Systems  Constitutional:  Negative for chills, fever, malaise/fatigue and weight loss.  HENT:  Negative for congestion, sinus pain and sore throat.   Eyes: Negative.   Respiratory:  Positive for cough, shortness of breath and wheezing. Negative for hemoptysis and sputum production.   Cardiovascular:  Negative for chest pain, palpitations, orthopnea, claudication and leg swelling.  Gastrointestinal:  Negative for abdominal pain, heartburn, nausea and vomiting.  Genitourinary: Negative.   Musculoskeletal:  Negative for joint pain and myalgias.  Skin:  Negative for rash.  Neurological:  Negative for weakness.  Endo/Heme/Allergies: Negative.   Psychiatric/Behavioral: Negative.     Objective:   Vitals:   04/04/22 1542  BP: 118/70  Pulse: 80  SpO2: 96%  Weight: 204 lb 12.8 oz (92.9 kg)  Height: '5\' 9"'$  (1.753 m)   Physical Exam Constitutional:      General: He is not in acute distress. HENT:     Head: Normocephalic and atraumatic.  Eyes:     Extraocular Movements: Extraocular movements intact.     Conjunctiva/sclera: Conjunctivae normal.  Cardiovascular:     Rate and Rhythm: Normal rate and regular rhythm.     Pulses: Normal pulses.     Heart sounds: Normal heart sounds. No murmur heard. Pulmonary:     Breath sounds: Decreased air movement present.  Musculoskeletal:     Right lower leg: No edema.  Left lower leg: No edema.  Lymphadenopathy:     Cervical: Cervical adenopathy present.  Skin:    General: Skin is warm and dry.  Neurological:     General: No focal deficit present.     Mental Status: He is alert.    CBC    Component Value  Date/Time   WBC 12.3 (H) 03/08/2022 0949   RBC 4.77 03/08/2022 0949   HGB 14.9 03/08/2022 0949   HGB 15.2 02/15/2022 1447   HCT 44.6 03/08/2022 0949   HCT 44.6 02/15/2022 1447   PLT 176.0 03/08/2022 0949   PLT 210 02/15/2022 1447   MCV 93.6 03/08/2022 0949   MCV 91 02/15/2022 1447   MCH 31.0 02/15/2022 1447   MCH 32.3 11/03/2019 1140   MCHC 33.3 03/08/2022 0949   RDW 14.0 03/08/2022 0949   RDW 12.7 02/15/2022 1447   LYMPHSABS 4.2 (H) 03/08/2022 0949   LYMPHSABS 2.2 02/15/2022 1447   MONOABS 0.7 03/08/2022 0949   EOSABS 0.1 03/08/2022 0949   EOSABS 0.1 02/15/2022 1447   BASOSABS 0.0 03/08/2022 0949   BASOSABS 0.1 02/15/2022 1447      Latest Ref Rng & Units 02/15/2022    2:47 PM 08/14/2021    2:43 PM 02/13/2021    2:47 PM  BMP  Glucose 70 - 99 mg/dL 117  95  106   BUN 8 - 27 mg/dL '25  21  17   '$ Creatinine 0.76 - 1.27 mg/dL 2.03  1.96  2.03   BUN/Creat Ratio 10 - '24 12  11  8   '$ Sodium 134 - 144 mmol/L 140  141  142   Potassium 3.5 - 5.2 mmol/L 4.1  4.9  4.8   Chloride 96 - 106 mmol/L 104  106  108   CO2 20 - 29 mmol/L '22  21  18   '$ Calcium 8.6 - 10.2 mg/dL 9.3  9.2  9.7     Chest imaging: CT Chest 03/19/22 1. Scattered areas of minimal subpleural ground-glass, right greater than left, nonspecific. Findings may reflect scarring. Difficult to exclude interstitial lung disease such as nonspecific interstitial pneumonitis. Findings are indeterminate for UIP per consensus guidelines: Diagnosis of Idiopathic Pulmonary Fibrosis: An Official ATS/ERS/JRS/ALAT Clinical Practice Guideline. [place], Iss 5, [phone], Dec 29 2016. 2. Aortic atherosclerosis (ICD10-I70.0). Coronary artery calcification. 3.  Emphysema  CXR 07/2019 The heart size and mediastinal contours are within normal limits. Unchanged scarring of the upper lungs. The visualized skeletal structures are unremarkable.  PFT:    Latest Ref Rng & Units 03/08/2022    9:45 AM  PFT Results   FVC-Pre L 4.42   FVC-Predicted Pre % 114   FVC-Post L 4.53   FVC-Predicted Post % 117   Pre FEV1/FVC % % 72   Post FEV1/FCV % % 69   FEV1-Pre L 3.16   FEV1-Predicted Pre % 113   FEV1-Post L 3.11   DLCO uncorrected ml/min/mmHg 19.66   DLCO UNC% % 83   DLCO corrected ml/min/mmHg 19.66   DLCO COR %Predicted % 83   DLVA Predicted % 71   TLC L 7.93   TLC % Predicted % 119   RV % Predicted % 134     Labs:  Path:  Echo:  Heart Catheterization:  Assessment & Plan:   Chronic obstructive pulmonary disease, unspecified COPD type (HCC)  Nocturnal hypoxemia - Plan: Pulse oximetry, overnight  Discussion: Antonio Bowen is a 76 year old male, former smoker with  chronic hypoxemic respiratory failure, BOOP, GERD and hypertension who returns to pulmonary clinic for COPD.   His CT Chest scan and PFTs are overall stable since 2021. No significant bronchodilator response. He does not have significant elevation in eosinophils or IgE. We discussed that he would not likely benefit from trial of biologic at this time.  He is to continue symbicort twice daily.   We discussed potential for pulmonary rehab but he would like to start walking more on his own first.   Follow up in 6 months.    Current Outpatient Medications:    acetaminophen (TYLENOL) 500 MG tablet, Take 1,000 mg by mouth 2 (two) times daily as needed (for pain.)., Disp: , Rfl:    albuterol (VENTOLIN HFA) 108 (90 Base) MCG/ACT inhaler, Inhale 1-2 puffs into the lungs every 4 (four) hours as needed for wheezing or shortness of breath. INHALE TWO PUFFS INTO LUNGS EVERY 6 HOURS AS NEEDED FOR WHEEZING, Disp: 18 g, Rfl: 3   budesonide-formoterol (SYMBICORT) 160-4.5 MCG/ACT inhaler, Inhale 2 puffs into the lungs 2 (two) times daily. in the morning and at bedtime., Disp: 30.6 g, Rfl: 3   cetirizine (ZYRTEC) 10 MG tablet, Take 10 mg by mouth at bedtime. , Disp: , Rfl:    clopidogrel (PLAVIX) 75 MG tablet, TAKE 1 TABLET EVERY DAY, Disp:  90 tablet, Rfl: 3   fluticasone (FLONASE) 50 MCG/ACT nasal spray, USE 1 SPRAY IN EACH NOSTRIL TWICE DAILY AS NEEDED FOR ALLERGIES OR RHINITIS (Patient taking differently: Place 2 sprays into both nostrils 2 (two) times daily.), Disp: 48 g, Rfl: 3   Homeopathic Products (LEG CRAMP RELIEF PO), Take 2 tablets by mouth at bedtime. [place] leg cramp, Disp: , Rfl:    levothyroxine (SYNTHROID) 137 MCG tablet, Take 1 tablet (137 mcg total) by mouth daily before breakfast., Disp: 90 tablet, Rfl: 3   losartan-hydrochlorothiazide (HYZAAR) 50-12.5 MG tablet, Take 1 tablet by mouth daily., Disp: 90 tablet, Rfl: 3   magnesium oxide (MAG-OX) 400 MG tablet, Take 400 mg by mouth at bedtime. , Disp: , Rfl:    meloxicam (MOBIC) 15 MG tablet, Take 1 tablet (15 mg total) by mouth daily., Disp: 90 tablet, Rfl: 3   omeprazole (PRILOSEC) 40 MG capsule, Take 1 capsule (40 mg total) by mouth in the morning and at bedtime., Disp: 180 capsule, Rfl: 3   OXYGEN, Inhale 3 L into the lungs at bedtime., Disp: , Rfl:    SUPER B COMPLEX/C PO, Take 1 capsule by mouth at bedtime., Disp: , Rfl:    traMADol (ULTRAM) 50 MG tablet, Take 100 mg by mouth 3 (three) times daily., Disp: , Rfl:    traZODone (DESYREL) 50 MG tablet, Take 0.5-1 tablets (25-50 mg total) by mouth at bedtime as needed for sleep., Disp: 90 tablet, Rfl: 3

## 2022-04-04 NOTE — Patient Instructions (Addendum)
Your CT chest appears stable compared to 2021  Your breathing tests are stable compared to previous tests  Recommend walking your drive way multiples times per day   Continue Symbicort 2 puffs twice daily - rinse mouth out after each visit   Follow up in 6 months

## 2022-04-26 ENCOUNTER — Ambulatory Visit (INDEPENDENT_AMBULATORY_CARE_PROVIDER_SITE_OTHER): Payer: Medicare HMO

## 2022-04-26 DIAGNOSIS — I639 Cerebral infarction, unspecified: Secondary | ICD-10-CM | POA: Diagnosis not present

## 2022-04-26 LAB — CUP PACEART REMOTE DEVICE CHECK
Date Time Interrogation Session: 20231227231943
Implantable Pulse Generator Implant Date: 20210708

## 2022-04-30 DIAGNOSIS — G629 Polyneuropathy, unspecified: Secondary | ICD-10-CM

## 2022-04-30 HISTORY — PX: CATARACT EXTRACTION: SUR2

## 2022-04-30 HISTORY — DX: Polyneuropathy, unspecified: G62.9

## 2022-05-07 NOTE — Progress Notes (Signed)
Carelink Summary Report / Loop Recorder 

## 2022-05-08 ENCOUNTER — Encounter: Payer: Self-pay | Admitting: Pulmonary Disease

## 2022-05-14 NOTE — Progress Notes (Signed)
[facility] Summary Report / Loop Recorder

## 2022-05-29 ENCOUNTER — Ambulatory Visit: Payer: Medicare HMO

## 2022-05-29 DIAGNOSIS — I639 Cerebral infarction, unspecified: Secondary | ICD-10-CM | POA: Diagnosis not present

## 2022-05-30 LAB — CUP PACEART REMOTE DEVICE CHECK
Date Time Interrogation Session: 20240127230526
Implantable Pulse Generator Implant Date: 20210708

## 2022-06-14 DIAGNOSIS — M5136 Other intervertebral disc degeneration, lumbar region: Secondary | ICD-10-CM | POA: Diagnosis not present

## 2022-06-14 DIAGNOSIS — M5416 Radiculopathy, lumbar region: Secondary | ICD-10-CM | POA: Diagnosis not present

## 2022-06-25 NOTE — Progress Notes (Signed)
Carelink Summary Report / Loop Recorder 

## 2022-06-27 ENCOUNTER — Ambulatory Visit: Payer: Medicare HMO

## 2022-06-27 DIAGNOSIS — I639 Cerebral infarction, unspecified: Secondary | ICD-10-CM

## 2022-06-27 LAB — CUP PACEART REMOTE DEVICE CHECK
Date Time Interrogation Session: 20240227231549
Implantable Pulse Generator Implant Date: 20210708

## 2022-06-28 DIAGNOSIS — M4316 Spondylolisthesis, lumbar region: Secondary | ICD-10-CM | POA: Diagnosis not present

## 2022-06-28 DIAGNOSIS — M5416 Radiculopathy, lumbar region: Secondary | ICD-10-CM | POA: Diagnosis not present

## 2022-07-02 ENCOUNTER — Ambulatory Visit: Payer: Medicare HMO

## 2022-07-10 ENCOUNTER — Ambulatory Visit (INDEPENDENT_AMBULATORY_CARE_PROVIDER_SITE_OTHER): Payer: Medicare HMO

## 2022-07-10 VITALS — Ht 69.0 in | Wt 204.0 lb

## 2022-07-10 DIAGNOSIS — Z0100 Encounter for examination of eyes and vision without abnormal findings: Secondary | ICD-10-CM

## 2022-07-10 DIAGNOSIS — Z0000 Encounter for general adult medical examination without abnormal findings: Secondary | ICD-10-CM

## 2022-07-10 NOTE — Patient Instructions (Signed)
Antonio Bowen , Thank you for taking time to come for your Medicare Wellness Visit. I appreciate your ongoing commitment to your health goals. Please review the following plan we discussed and let me know if I can assist you in the future.   These are the goals we discussed:  Goals      Exercise 150 min/wk Moderate Activity     Maintain current activity level. Increase as tolerated once your joints and leg are feeling better     Exercise 3x per week (30 min per time)     Obtain Annual Eye (retinal)  Exam      Patient Stated     07/05/2020 AWV Goal: Fall Prevention  Over the next year, patient will decrease their risk for falls by: Using assistive devices, such as a cane or walker, as needed Identifying fall risks within their home and correcting them by: Removing throw rugs Adding handrails to stairs or ramps Removing clutter and keeping a clear pathway throughout the home Increasing light, especially at night Adding shower handles/bars Raising toilet seat Identifying potential personal risk factors for falls: Medication side effects Incontinence/urgency Vestibular dysfunction Hearing loss Musculoskeletal disorders Neurological disorders Orthostatic hypotension       Prevent falls        This is a list of the screening recommended for you and due dates:  Health Maintenance  Topic Date Due   DTaP/Tdap/Td vaccine (4 - Td or Tdap) 01/04/2021   COVID-19 Vaccine (4 - 2023-24 season) 12/29/2021   Colon Cancer Screening  02/11/2023*   Screening for Lung Cancer  03/20/2023   Medicare Annual Wellness Visit  07/10/2023   Pneumonia Vaccine  Completed   Flu Shot  Completed   Hepatitis C Screening: USPSTF Recommendation to screen - Ages 18-79 yo.  Completed   Zoster (Shingles) Vaccine  Completed   HPV Vaccine  Aged Out  *Topic was postponed. The date shown is not the original due date.    Advanced directives: Advance directive discussed with you today. I have provided a copy for  you to complete at home and have notarized. Once this is complete please bring a copy in to our office so we can scan it into your chart.   Conditions/risks identified: Aim for 30 minutes of exercise or brisk walking, 6-8 glasses of water, and 5 servings of fruits and vegetables each day.   Next appointment: Follow up in one year for your annual wellness visit.   Preventive Care 77 Years and Older, Male  Preventive care refers to lifestyle choices and visits with your health care provider that can promote health and wellness. What does preventive care include? A yearly physical exam. This is also called an annual well check. Dental exams once or twice a year. Routine eye exams. Ask your health care provider how often you should have your eyes checked. Personal lifestyle choices, including: Daily care of your teeth and gums. Regular physical activity. Eating a healthy diet. Avoiding tobacco and drug use. Limiting alcohol use. Practicing safe sex. Taking low doses of aspirin every day. Taking vitamin and mineral supplements as recommended by your health care provider. What happens during an annual well check? The services and screenings done by your health care provider during your annual well check will depend on your age, overall health, lifestyle risk factors, and family history of disease. Counseling  Your health care provider may ask you questions about your: Alcohol use. Tobacco use. Drug use. Emotional well-being. Home and relationship well-being.  Sexual activity. Eating habits. History of falls. Memory and ability to understand (cognition). Work and work Statistician. Screening  You may have the following tests or measurements: Height, weight, and BMI. Blood pressure. Lipid and cholesterol levels. These may be checked every 5 years, or more frequently if you are over 1 years old. Skin check. Lung cancer screening. You may have this screening every year starting at age  77 if you have a 30-pack-year history of smoking and currently smoke or have quit within the past 15 years. Fecal occult blood test (FOBT) of the stool. You may have this test every year starting at age 91. Flexible sigmoidoscopy or colonoscopy. You may have a sigmoidoscopy every 5 years or a colonoscopy every 10 years starting at age 77. Prostate cancer screening. Recommendations will vary depending on your family history and other risks. Hepatitis C blood test. Hepatitis B blood test. Sexually transmitted disease (STD) testing. Diabetes screening. This is done by checking your blood sugar (glucose) after you have not eaten for a while (fasting). You may have this done every 1-3 years. Abdominal aortic aneurysm (AAA) screening. You may need this if you are a current or former smoker. Osteoporosis. You may be screened starting at age 77 if you are at high risk. Talk with your health care provider about your test results, treatment options, and if necessary, the need for more tests. Vaccines  Your health care provider may recommend certain vaccines, such as: Influenza vaccine. This is recommended every year. Tetanus, diphtheria, and acellular pertussis (Tdap, Td) vaccine. You may need a Td booster every 10 years. Zoster vaccine. You may need this after age 77. Pneumococcal 13-valent conjugate (PCV13) vaccine. One dose is recommended after age 77. Pneumococcal polysaccharide (PPSV23) vaccine. One dose is recommended after age 77. Talk to your health care provider about which screenings and vaccines you need and how often you need them. This information is not intended to replace advice given to you by your health care provider. Make sure you discuss any questions you have with your health care provider. Document Released: 05/13/2015 Document Revised: 01/04/2016 Document Reviewed: 02/15/2015 Elsevier Interactive Patient Education  2017 [place] Prevention in the Home Falls can cause  injuries. They can happen to people of all ages. There are many things you can do to make your home safe and to help prevent falls. What can I do on the outside of my home? Regularly fix the edges of walkways and driveways and fix any cracks. Remove anything that might make you trip as you walk through a door, such as a raised step or threshold. Trim any bushes or trees on the path to your home. Use bright outdoor lighting. Clear any walking paths of anything that might make someone trip, such as rocks or tools. Regularly check to see if handrails are loose or broken. Make sure that both sides of any steps have handrails. Any raised decks and porches should have guardrails on the edges. Have any leaves, snow, or ice cleared regularly. Use sand or salt on walking paths during winter. Clean up any spills in your garage right away. This includes oil or grease spills. What can I do in the bathroom? Use night lights. Install grab bars by the toilet and in the tub and shower. Do not use towel bars as grab bars. Use non-skid mats or decals in the tub or shower. If you need to sit down in the shower, use a plastic, non-slip stool. Keep the floor  dry. Clean up any water that spills on the floor as soon as it happens. Remove soap buildup in the tub or shower regularly. Attach bath mats securely with double-sided non-slip rug tape. Do not have throw rugs and other things on the floor that can make you trip. What can I do in the bedroom? Use night lights. Make sure that you have a light by your bed that is easy to reach. Do not use any sheets or blankets that are too big for your bed. They should not hang down onto the floor. Have a firm chair that has side arms. You can use this for support while you get dressed. Do not have throw rugs and other things on the floor that can make you trip. What can I do in the kitchen? Clean up any spills right away. Avoid walking on wet floors. Keep items that you  use a lot in easy-to-reach places. If you need to reach something above you, use a strong step stool that has a grab bar. Keep electrical cords out of the way. Do not use floor polish or wax that makes floors slippery. If you must use wax, use non-skid floor wax. Do not have throw rugs and other things on the floor that can make you trip. What can I do with my stairs? Do not leave any items on the stairs. Make sure that there are handrails on both sides of the stairs and use them. Fix handrails that are broken or loose. Make sure that handrails are as long as the stairways. Check any carpeting to make sure that it is firmly attached to the stairs. Fix any carpet that is loose or worn. Avoid having throw rugs at the top or bottom of the stairs. If you do have throw rugs, attach them to the floor with carpet tape. Make sure that you have a light switch at the top of the stairs and the bottom of the stairs. If you do not have them, ask someone to add them for you. What else can I do to help prevent falls? Wear shoes that: Do not have high heels. Have rubber bottoms. Are comfortable and fit you well. Are closed at the toe. Do not wear sandals. If you use a stepladder: Make sure that it is fully opened. Do not climb a closed stepladder. Make sure that both sides of the stepladder are locked into place. Ask someone to hold it for you, if possible. Clearly mark and make sure that you can see: Any grab bars or handrails. First and last steps. Where the edge of each step is. Use tools that help you move around (mobility aids) if they are needed. These include: Canes. Walkers. Scooters. Crutches. Turn on the lights when you go into a dark area. Replace any light bulbs as soon as they burn out. Set up your furniture so you have a clear path. Avoid moving your furniture around. If any of your floors are uneven, fix them. If there are any pets around you, be aware of where they are. Review your  medicines with your doctor. Some medicines can make you feel dizzy. This can increase your chance of falling. Ask your doctor what other things that you can do to help prevent falls. This information is not intended to replace advice given to you by your health care provider. Make sure you discuss any questions you have with your health care provider. Document Released: 02/10/2009 Document Revised: 09/22/2015 Document Reviewed: 05/21/2014 Elsevier Interactive Patient  Education  2017 [employer].

## 2022-07-10 NOTE — Progress Notes (Signed)
Subjective:   Antonio Bowen is a 77 y.o. male who presents for Medicare Annual/Subsequent preventive examination. I connected with  Antonio Bowen on 07/10/22 by a audio enabled telemedicine application and verified that I am speaking with the correct person using two identifiers.  Patient Location: Home  Provider Location: Home Office  I discussed the limitations of evaluation and management by telemedicine. The patient expressed understanding and agreed to proceed.  Review of Systems     Cardiac Risk Factors include: advanced age (>104mn, >>32women);male gender;dyslipidemia;diabetes mellitus;hypertension     Objective:    Today's Vitals   07/10/22 1504  Weight: 204 lb (92.5 kg)  Height: '5\' 9"'$  (1.753 m)   Body mass index is 30.13 kg/m.     07/10/2022    3:07 PM 01/30/2022    8:22 AM 07/06/2021    4:02 PM 07/05/2020    3:33 PM 04/14/2020    3:44 PM 11/04/2019    4:00 AM 08/18/2019    7:18 AM  Advanced Directives  Does Patient Have a Medical Advance Directive? No No Yes No No No No  Type of AComptrollerLiving will      Copy of [place] Chart?   No - copy requested      Would patient like information on creating a medical advance directive? No - Patient declined No - Patient declined  No - Patient declined No - Patient declined No - Patient declined Yes (MAU/Ambulatory/Procedural Areas - Information given)    Current Medications (verified) Outpatient Encounter Medications as of 07/10/2022  Medication Sig   acetaminophen (TYLENOL) 500 MG tablet Take 1,000 mg by mouth 2 (two) times daily as needed (for pain.).   albuterol (VENTOLIN HFA) 108 (90 Base) MCG/ACT inhaler Inhale 1-2 puffs into the lungs every 4 (four) hours as needed for wheezing or shortness of breath. INHALE TWO PUFFS INTO LUNGS EVERY 6 HOURS AS NEEDED FOR WHEEZING   budesonide-formoterol (SYMBICORT) 160-4.5 MCG/ACT inhaler Inhale 2 puffs into the lungs 2  (two) times daily. in the morning and at bedtime.   cetirizine (ZYRTEC) 10 MG tablet Take 10 mg by mouth at bedtime.    clopidogrel (PLAVIX) 75 MG tablet TAKE 1 TABLET EVERY DAY   fluticasone (FLONASE) 50 MCG/ACT nasal spray USE 1 SPRAY IN EACH NOSTRIL TWICE DAILY AS NEEDED FOR ALLERGIES OR RHINITIS (Patient taking differently: Place 2 sprays into both nostrils 2 (two) times daily.)   Homeopathic Products (LEG CRAMP RELIEF PO) Take 2 tablets by mouth at bedtime. [place] leg cramp   levothyroxine (SYNTHROID) 137 MCG tablet Take 1 tablet (137 mcg total) by mouth daily before breakfast.   losartan-hydrochlorothiazide (HYZAAR) 50-12.5 MG tablet Take 1 tablet by mouth daily.   magnesium oxide (MAG-OX) 400 MG tablet Take 400 mg by mouth at bedtime.    meloxicam (MOBIC) 15 MG tablet Take 1 tablet (15 mg total) by mouth daily.   omeprazole (PRILOSEC) 40 MG capsule Take 1 capsule (40 mg total) by mouth in the morning and at bedtime.   OXYGEN Inhale 3 L into the lungs at bedtime.   SUPER B COMPLEX/C PO Take 1 capsule by mouth at bedtime.   traMADol (ULTRAM) 50 MG tablet Take 100 mg by mouth 3 (three) times daily.   traZODone (DESYREL) 50 MG tablet Take 0.5-1 tablets (25-50 mg total) by mouth at bedtime as needed for sleep.   No facility-administered encounter medications on file as of 07/10/2022.  Allergies (verified) Nuts, Other, Vicodin [hydrocodone-acetaminophen], Codeine, Aspirin, and Penicillins   History: Past Medical History:  Diagnosis Date   Allergic rhinitis    Arthritis    BOOP (bronchiolitis obliterans with organizing pneumonia) ([place])    Bronchitis    Colon polyps    COPD (chronic obstructive pulmonary disease) ([place])    Dyspnea    with exertion    Dysrhythmia    skipped beats- followed by Dr Angelena Form    GERD (gastroesophageal reflux disease)    Hypertension    Hypothyroidism    Kidney stone    Low back pain    Oxygen dependent    uses 3L/[place] at nite occasional during day if  needed    Stroke [facility])    hx of 2 strokes which patient did not know he had had    Thyroid disease    Tobacco dependence    Past Surgical History:  Procedure Laterality Date   BALLOON DILATION N/A 01/30/2022   Procedure: BALLOON DILATION;  Surgeon: Irene Shipper, MD;  Location: [facility];  Service: Gastroenterology;  Laterality: N/A;   BIOPSY  08/18/2019   Procedure: BIOPSY;  Surgeon: Irene Shipper, MD;  Location: [facility];  Service: Endoscopy;;   broncoscopy      carpel tunnal     left   COLONOSCOPY     x 4   DUPUYTREN CONTRACTURE RELEASE     Left   ESOPHAGOGASTRODUODENOSCOPY (EGD) WITH PROPOFOL N/A 03/26/2016   Procedure: ESOPHAGOGASTRODUODENOSCOPY (EGD) WITH PROPOFOL;  Surgeon: Irene Shipper, MD;  Location: [facility];  Service: Endoscopy;  Laterality: N/A;   ESOPHAGOGASTRODUODENOSCOPY (EGD) WITH PROPOFOL N/A 08/18/2019   Procedure: ESOPHAGOGASTRODUODENOSCOPY (EGD) WITH PROPOFOL;  Surgeon: Irene Shipper, MD;  Location: [facility];  Service: Endoscopy;  Laterality: N/A;  Possible dilation   ESOPHAGOGASTRODUODENOSCOPY (EGD) WITH PROPOFOL N/A 01/30/2022   Procedure: ESOPHAGOGASTRODUODENOSCOPY (EGD) WITH PROPOFOL;  Surgeon: Irene Shipper, MD;  Location: [facility];  Service: Gastroenterology;  Laterality: N/A;   ESOPHAGOGASTRODUODENOSCOPY ENDOSCOPY     x 3   FACIAL FRACTURE SURGERY     LOOP RECORDER INSERTION N/A 11/05/2019   Procedure: LOOP RECORDER INSERTION;  Surgeon: Thompson Grayer, MD;  Location: [facility];  Service: Cardiovascular;  Laterality: N/A;   OTHER SURGICAL HISTORY     facial surgery   SAVORY DILATION N/A 08/18/2019   Procedure: SAVORY DILATION;  Surgeon: Irene Shipper, MD;  Location: [facility];  Service: Endoscopy;  Laterality: N/A;   TOTAL HIP ARTHROPLASTY  01/2011   Right hip, [place]   ULNAR NERVE TRANSPOSITION     left   Family History  Problem Relation Age of Onset   Arthritis Father    Stroke Father 35   Prostate cancer Maternal  Grandfather    Stomach cancer Maternal Grandmother    Cancer Paternal Grandmother    Colon cancer Other        mat 1st cousin   Cancer Mother    Healthy Daughter    Healthy Son    Pancreatic cancer Neg Hx    Esophageal cancer Neg Hx    Social History   Socioeconomic History   Marital status: Married    Spouse name: Becky   Number of children: 2   Years of education: 11   Highest education level: 11th grade  Occupational History   Occupation: Retired    Comment: [employer], 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, also worked in [employer]  Tobacco Use   Smoking status: Former  Packs/day: 1.00    Years: 50.00    Total pack years: 50.00    Types: Cigarettes    Quit date: 07/30/2010    Years since quitting: 11.9   Smokeless tobacco: Current    Types: Snuff  Vaping Use   Vaping Use: Never used  Substance and Sexual Activity   Alcohol use: Yes    Comment: rare   Drug use: No   Sexual activity: Not on file  Other Topics Concern   Not on file  Social History Narrative   Not on file   Social Determinants of Health   Financial Resource Strain: Low Risk  (07/10/2022)   Overall Financial Resource Strain (CARDIA)    Difficulty of Paying Living Expenses: Not hard at all  Food Insecurity: No Food Insecurity (07/10/2022)   Hunger Vital Sign    Worried About Running Out of Food in the Last Year: Never true    Ran Out of Food in the Last Year: Never true  Transportation Needs: No Transportation Needs (07/06/2021)   PRAPARE - Hydrologist (Medical): No    Lack of Transportation (Non-Medical): No  Physical Activity: Inactive (07/10/2022)   Exercise Vital Sign    Days of Exercise per Week: 0 days    Minutes of Exercise per Session: 0 min  Stress: No Stress Concern Present (07/10/2022)   [place]    Feeling of Stress : Not at all  Social Connections: Moderately Isolated (07/10/2022)   Social  Connection and Isolation Panel [NHANES]    Frequency of Communication with Friends and Family: More than three times a week    Frequency of Social Gatherings with Friends and Family: More than three times a week    Attends Religious Services: Never    Marine scientist or Organizations: No    Attends Music therapist: Never    Marital Status: Married    Tobacco Counseling Ready to quit: Not Answered Counseling given: Not Answered   Clinical Intake:  Pre-visit preparation completed: Yes  Pain : No/denies pain     Nutritional Risks: None Diabetes: No  How often do you need to have someone help you when you read instructions, pamphlets, or other written materials from your doctor or pharmacy?: 1 - Never  Diabetic?no   Interpreter Needed?: No  Information entered by :: Jadene Pierini, LPN   Activities of Daily Living    07/10/2022    3:08 PM 07/07/2022    9:30 PM  In your present state of health, do you have any difficulty performing the following activities:  Hearing? 0 1  Vision? 0 0  Difficulty concentrating or making decisions? 0 0  Walking or climbing stairs? 0 1  Dressing or bathing? 0 0  Doing errands, shopping? 0 0  Preparing Food and eating ? N N  Using the Toilet? N N  In the past six months, have you accidently leaked urine? N N  Do you have problems with loss of bowel control? N N  Managing your Medications? N N  Managing your Finances? N N  Housekeeping or managing your Housekeeping? N N    Patient Care Team: [name], [name], MD as PCP - General (Family Medicine)  Indicate any recent Medical Services you may have received from other than [facility] providers in the past year (date may be approximate).     Assessment:   This is a routine wellness examination for  Jeovany.  Hearing/Vision screen Vision Screening - Comments:: Referral 07/10/2022  Dietary issues and exercise activities discussed: Current Exercise Habits: The patient  does not participate in regular exercise at present, Exercise limited by: respiratory conditions(s)   Goals Addressed             This Visit's Progress    Exercise 3x per week (30 min per time)         Depression Screen    07/10/2022    3:07 PM 02/15/2022    1:13 PM 08/14/2021    2:00 PM 07/06/2021    3:59 PM 02/13/2021    2:14 PM 10/20/2020   10:55 AM 08/11/2020    2:47 PM  PHQ 2/9 Scores  PHQ - 2 Score 0 0 0 0 0 0 0  PHQ- 9 Score  '2 3  2      '$ Fall Risk    07/10/2022    3:05 PM 07/07/2022    9:30 PM 02/15/2022    1:13 PM 08/14/2021    2:00 PM 07/06/2021    3:40 PM  Fall Risk   Falls in the past year? 1 1 0 1 1  Number falls in past yr: 1 0  1 1  Injury with Fall? 1 1  0 0  Risk for fall due to : History of fall(s)   Impaired balance/gait History of fall(s);Orthopedic patient  Follow up Education provided;Falls prevention discussed   Falls evaluation completed Falls prevention discussed;Education provided    FALL RISK PREVENTION PERTAINING TO THE HOME:  Any stairs in or around the home? No  If so, are there any without handrails? No  Home free of loose throw rugs in walkways, pet beds, electrical cords, etc? Yes  Adequate lighting in your home to reduce risk of falls? Yes   ASSISTIVE DEVICES UTILIZED TO PREVENT FALLS:  Life alert? No  Use of a cane, walker or w/c? No  Grab bars in the bathroom? Yes  Shower chair or bench in shower? No  Elevated toilet seat or a handicapped toilet? No       08/13/2017    4:05 PM  MMSE - Mini Mental State Exam  Orientation to time 5  Orientation to Place 5  Registration 3  Attention/ Calculation 5  Recall 3  Language- name 2 objects 2  Language- repeat 1  Language- follow 3 step command 3  Language- read & follow direction 1  Write a sentence 1  Copy design 0  Total score 29        07/10/2022    3:08 PM 07/05/2020    3:35 PM 12/24/2018    4:14 PM  6CIT Screen  What Year? 0 points 0 points 0 points  What month? 0 points  0 points 0 points  What time? 0 points 0 points 0 points  Count back from 20 0 points 0 points 0 points  Months in reverse 0 points 0 points 0 points  Repeat phrase 0 points 0 points 0 points  Total Score 0 points 0 points 0 points    Immunizations Immunization History  Administered Date(s) Administered   Fluad Quad(high Dose 65+) 02/13/2021, 03/08/2022   Influenza Split 01/24/2012   Influenza, High Dose Seasonal PF 01/30/2018, 01/30/2018   Influenza,inj,Quad PF,6+ Mos 03/09/2013, 01/14/2014, 02/21/2015, 02/09/2016, 03/07/2017   Influenza-Unspecified 02/17/2020   Moderna Sars-Covid-2 Vaccination 06/15/2019, 07/13/2019, 02/24/2020   Pneumococcal Conjugate-13 01/14/2014   Pneumococcal Polysaccharide-23 01/24/2012   Td 07/03/2006   Tdap 07/03/2006, 01/05/2011  Zoster Recombinat (Shingrix) 08/14/2021, 03/08/2022   Zoster, Live 01/05/2011    TDAP status: Due, Education has been provided regarding the importance of this vaccine. Advised may receive this vaccine at local pharmacy or Health Dept. Aware to provide a copy of the vaccination record if obtained from local pharmacy or Health Dept. Verbalized acceptance and understanding.  Flu Vaccine status: Up to date  Pneumococcal vaccine status: Up to date  Covid-19 vaccine status: Completed vaccines  Qualifies for Shingles Vaccine? Yes   Zostavax completed Yes   Shingrix Completed?: Yes  Screening Tests Health Maintenance  Topic Date Due   DTaP/Tdap/Td (4 - Td or Tdap) 01/04/2021   COVID-19 Vaccine (4 - 2023-24 season) 12/29/2021   COLONOSCOPY (Pts 45-51yr Insurance coverage will need to be confirmed)  02/11/2023 (Originally 07/09/2017)   Lung Cancer Screening  03/20/2023   Medicare Annual Wellness (AWV)  07/10/2023   Pneumonia Vaccine 77 Years old  Completed   INFLUENZA VACCINE  Completed   Hepatitis C Screening  Completed   Zoster Vaccines- Shingrix  Completed   HPV VACCINES  Aged Out    Health Maintenance  Health  Maintenance Due  Topic Date Due   DTaP/Tdap/Td (4 - Td or Tdap) 01/04/2021   COVID-19 Vaccine (4 - 2023-24 season) 12/29/2021    Colorectal cancer screening: No longer required.   Lung Cancer Screening: (Low Dose CT Chest recommended if Age 77-80years, 30 pack-year currently smoking OR have quit w/in 15years.) does qualify.   Lung Cancer Screening Referral: 03/19/2022  Additional Screening:  Hepatitis C Screening: does not qualify; Completed 11/08/20018  Vision Screening: Recommended annual ophthalmology exams for early detection of glaucoma and other disorders of the eye. Is the patient up to date with their annual eye exam?  No  Who is the provider or what is the name of the office in which the patient attends annual eye exams? None referral 07/10/2022 If pt is not established with a provider, would they like to be referred to a provider to establish care? No .   Dental Screening: Recommended annual dental exams for proper oral hygiene  Community Resource Referral / Chronic Care Management: CRR required this visit?  No   CCM required this visit?  No      Plan:     I have personally reviewed and noted the following in the patient's chart:   Medical and social history Use of alcohol, tobacco or illicit drugs  Current medications and supplements including opioid prescriptions. Patient is not currently taking opioid prescriptions. Functional ability and status Nutritional status Physical activity Advanced directives List of other physicians Hospitalizations, surgeries, and ER visits in previous 12 months Vitals Screenings to include cognitive, depression, and falls Referrals and appointments  In addition, I have reviewed and discussed with patient certain preventive protocols, quality metrics, and best practice recommendations. A written personalized care plan for preventive services as well as general preventive health recommendations were provided to patient.      LDaphane Shepherd LPN   [id number]  Nurse Notes: Due Tdap Vaccine

## 2022-07-12 DIAGNOSIS — R200 Anesthesia of skin: Secondary | ICD-10-CM | POA: Diagnosis not present

## 2022-07-12 DIAGNOSIS — M4316 Spondylolisthesis, lumbar region: Secondary | ICD-10-CM | POA: Diagnosis not present

## 2022-07-12 DIAGNOSIS — R202 Paresthesia of skin: Secondary | ICD-10-CM | POA: Diagnosis not present

## 2022-07-12 DIAGNOSIS — M5416 Radiculopathy, lumbar region: Secondary | ICD-10-CM | POA: Diagnosis not present

## 2022-07-12 DIAGNOSIS — M545 Low back pain, unspecified: Secondary | ICD-10-CM | POA: Diagnosis not present

## 2022-07-19 DIAGNOSIS — M5416 Radiculopathy, lumbar region: Secondary | ICD-10-CM | POA: Diagnosis not present

## 2022-07-19 DIAGNOSIS — Z6831 Body mass index (BMI) 31.0-31.9, adult: Secondary | ICD-10-CM | POA: Diagnosis not present

## 2022-07-19 DIAGNOSIS — M4316 Spondylolisthesis, lumbar region: Secondary | ICD-10-CM | POA: Diagnosis not present

## 2022-07-24 ENCOUNTER — Telehealth: Payer: Self-pay | Admitting: Pulmonary Disease

## 2022-07-24 NOTE — Telephone Encounter (Signed)
Fax received from Dr. Sherley Bounds with [facility] to perform a L4-5 Hemifacetectomy/posterolateral Fusion on patient.  Patient needs surgery clearance. Surgery is pending. Patient was seen on 04/04/2022. Office protocol is a risk assessment can be sent to surgeon if patient has been seen in 60 days or less.   Sending to Dr. Erin Fulling for risk assessment or recommendations if patient needs to be seen in office prior to surgical procedure.    Dr. Erin Fulling I have patient scheduled for OV on 07/31/2022

## 2022-07-25 NOTE — Telephone Encounter (Signed)
thanks

## 2022-07-30 ENCOUNTER — Ambulatory Visit (INDEPENDENT_AMBULATORY_CARE_PROVIDER_SITE_OTHER): Payer: Medicare HMO

## 2022-07-30 DIAGNOSIS — I639 Cerebral infarction, unspecified: Secondary | ICD-10-CM | POA: Diagnosis not present

## 2022-07-31 ENCOUNTER — Ambulatory Visit: Payer: Medicare HMO | Admitting: Pulmonary Disease

## 2022-07-31 ENCOUNTER — Encounter: Payer: Self-pay | Admitting: Pulmonary Disease

## 2022-07-31 ENCOUNTER — Other Ambulatory Visit: Payer: Self-pay | Admitting: Pulmonary Disease

## 2022-07-31 VITALS — BP 114/68 | HR 92 | Temp 97.9°F | Ht 69.0 in | Wt 204.2 lb

## 2022-07-31 DIAGNOSIS — J449 Chronic obstructive pulmonary disease, unspecified: Secondary | ICD-10-CM

## 2022-07-31 DIAGNOSIS — G4734 Idiopathic sleep related nonobstructive alveolar hypoventilation: Secondary | ICD-10-CM

## 2022-07-31 LAB — CUP PACEART REMOTE DEVICE CHECK
Date Time Interrogation Session: 20240331232351
Implantable Pulse Generator Implant Date: 20210708

## 2022-07-31 MED ORDER — IPRATROPIUM-ALBUTEROL 0.5-2.5 (3) MG/3ML IN SOLN: 3.0000 mL | RESPIRATORY_TRACT | Status: DC | PRN

## 2022-07-31 MED ORDER — IPRATROPIUM-ALBUTEROL 0.5-2.5 (3) MG/3ML IN SOLN: 3.0000 mL | Freq: Four times a day (QID) | RESPIRATORY_TRACT | Status: DC | PRN

## 2022-07-31 MED ORDER — IPRATROPIUM-ALBUTEROL 0.5-2.5 (3) MG/3ML IN SOLN: 3.0000 mL | RESPIRATORY_TRACT | 0 refills | Status: DC | PRN

## 2022-07-31 NOTE — Patient Instructions (Addendum)
Your Post-Operative Risk Stratification Intermediate risk 13.3% risk of in-hospital post-op pulmonary complications (composite including respiratory failure, respiratory infection, pleural effusion, atelectasis, pneumothorax, bronchospasm, aspiration pneumonitis)  Try Breztri inhaler 2 puffs twice daily - rinse mouth out after each use  Resume symbicort 2 puffs twice daily if you do not want to continue on Breztri  Use albuterol inhaler 1-2 puffs every 4-6 hours as needed  We will order you a nebulizer machine and duoneb solution to use as needed every 4-6 hours as needed  Please let us know when you are scheduled for you upcoming surgery  Follow up in 6 months

## 2022-07-31 NOTE — Progress Notes (Signed)
Carelink Summary Report / Loop Recorder 

## 2022-07-31 NOTE — Progress Notes (Unsigned)
Synopsis: Referred in November 2023 for COPD by Caryl Pina, MD  Subjective:   PATIENT ID: Antonio Bowen GENDER: male DOB: 10-29-1945, MRN: [id number]  HPI  Chief Complaint  Patient presents with   Follow-up    Wheezing, sob, pt wants to discuss nebulizer.    Antonio Bowen is a 77 year old male, former smoker with chronic hypoxemic respiratory failure, BOOP, GERD and hypertension who returns to pulmonary clinic for COPD.   He is being evaluated by [facility] for L4-5 hemifacetectomy/posterolateral fusion. It would be a 2 hour surgery.   OV 04/04/22 HRCT Chest 03/19/22 shows severe centrilobular and paraseptal emphysema. Subpleural scarring of right middle lobe. Scattered areas of subpleural ground glass, right greater than left. Overall, unchanged from CT Chest in 2021.   PFTs show mild obstruction, no significant bronchodilator response, air trapping and normal DLCO.   Initial OV 03/05/22 He reports significant dyspnea with brushing his teeth, showering or getting dressed in the mornings. He is using 3L of oxygen at night and occasionally during the day when he has severe shortness of breath. He uses a fan most of the day that blows on his face that provides relief. He has a hard time breathing during hot weather.   He is currently using symbicort 160-4.5mcg 2 puffs twice daily. He has tried trelegy/breztri in the past without much more relief.   He has sinus congestion and drainage where he is using netty pot rinses daily.   He has intermittent productive cough. He was on prednisone in July and reports he felt significantly better during that time. He typically feels worse a couple days after steroid tapers are completed.   He has some seasonal allergies and is taking zyrtec and flonase daily.   He smoked for 40+ years and quit in 2014. 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 He grew up with second hand smoke in the home. He reports cutting some wood the other day and that seemed to aggravate his breathing due to the dust. He lives with his wife. He used to play golf but is unable now due to his breathing.   No family history of lung cancer.    Past Medical History:  Diagnosis Date   Allergic rhinitis    Arthritis    BOOP (bronchiolitis obliterans with organizing pneumonia)    Bronchitis    Colon polyps    COPD (chronic obstructive pulmonary disease)    Dyspnea    with exertion    Dysrhythmia    skipped beats- followed by Dr Angelena Form    GERD (gastroesophageal reflux disease)    Hypertension    Hypothyroidism    Kidney stone    Low back pain    Oxygen dependent    uses 3L/[place] at nite occasional during day if needed    Stroke    hx of 2 strokes which patient did not know he had had    Thyroid disease    Tobacco dependence      Family History  Problem Relation Age of Onset   Arthritis Father    Stroke Father 4   Prostate cancer Maternal Grandfather    Stomach cancer Maternal Grandmother    Cancer Paternal Grandmother    Colon cancer Other        mat 1st cousin   Cancer Mother  Healthy Daughter    Healthy Son    Pancreatic cancer Neg Hx    Esophageal cancer Neg Hx      Social History   Socioeconomic History   Marital status: Married    Spouse name: Becky   Number of children: 2   Years of education: 11   Highest education level: 11th grade  Occupational History   Occupation: Retired    Comment: [employer], 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, also worked in [place] Use   Smoking status: Former    Packs/day: 1.00    Years: 50.00    Additional pack years: 0.00    Total pack years: 50.00    Types: Cigarettes    Quit date: 07/30/2010    Years since quitting: 12.0   Smokeless tobacco: Current    Types: Snuff  Vaping Use   Vaping Use: Never used  Substance and Sexual Activity   Alcohol use:  Yes    Comment: rare   Drug use: No   Sexual activity: Not on file  Other Topics Concern   Not on file  Social History Narrative   Not on file   Social Determinants of Health   Financial Resource Strain: Low Risk  (07/10/2022)   Overall Financial Resource Strain (CARDIA)    Difficulty of Paying Living Expenses: Not hard at all  Food Insecurity: No Food Insecurity (07/10/2022)   Hunger Vital Sign    Worried About Running Out of Food in the Last Year: Never true    [place] in the Last Year: Never true  Transportation Needs: No Transportation Needs (07/06/2021)   PRAPARE - Hydrologist (Medical): No    Lack of Transportation (Non-Medical): No  Physical Activity: Inactive (07/10/2022)   Exercise Vital Sign    Days of Exercise per Week: 0 days    Minutes of Exercise per Session: 0 min  Stress: No Stress Concern Present (07/10/2022)   Cantril    Feeling of Stress : Not at all  Social Connections: Moderately Isolated (07/10/2022)   Social Connection and Isolation Panel [NHANES]    Frequency of Communication with Friends and Family: More than three times a week    Frequency of Social Gatherings with Friends and Family: More than three times a week    Attends Religious Services: Never    Marine scientist or Organizations: No    Attends Archivist Meetings: Never    Marital Status: Married  Human resources officer Violence: Not At Risk (07/10/2022)   Humiliation, Afraid, Rape, and Kick questionnaire    Fear of Current or Ex-Partner: No    Emotionally Abused: No    Physically Abused: No    Sexually Abused: No     Allergies  Allergen Reactions   Nuts Nausea And Vomiting    sweating   Other Nausea And Vomiting    Per patient, cannot take any narcotic. Nausea and vomiting sweating   Vicodin [Hydrocodone-Acetaminophen]     tachycardia   Codeine Nausea And Vomiting    Aspirin Other (See Comments)    Upset stomach, bleeding   Penicillins Hives and Rash    Has patient had a PCN reaction causing immediate rash, facial/tongue/throat swelling, SOB or lightheadedness with hypotension:No Has patient had a PCN reaction causing severe rash involving mucus membranes or skin necrosis:No Has patient had a PCN reaction that required hospitalization:No Has patient had a PCN  reaction occurring within the last 10 years:Yes If all of the above answers are "NO", then may proceed with Cephalosporin use.      Outpatient Medications Prior to Visit  Medication Sig Dispense Refill   acetaminophen (TYLENOL) 500 MG tablet Take 1,000 mg by mouth 2 (two) times daily as needed (for pain.).     albuterol (VENTOLIN HFA) 108 (90 Base) MCG/ACT inhaler Inhale 1-2 puffs into the lungs every 4 (four) hours as needed for wheezing or shortness of breath. INHALE TWO PUFFS INTO LUNGS EVERY 6 HOURS AS NEEDED FOR WHEEZING 18 g 3   budesonide-formoterol (SYMBICORT) 160-4.5 MCG/ACT inhaler Inhale 2 puffs into the lungs 2 (two) times daily. in the morning and at bedtime. 30.6 g 3   cetirizine (ZYRTEC) 10 MG tablet Take 10 mg by mouth at bedtime.      clopidogrel (PLAVIX) 75 MG tablet TAKE 1 TABLET EVERY DAY 90 tablet 3   fluticasone (FLONASE) 50 MCG/ACT nasal spray USE 1 SPRAY IN EACH NOSTRIL TWICE DAILY AS NEEDED FOR ALLERGIES OR RHINITIS (Patient taking differently: Place 2 sprays into both nostrils 2 (two) times daily.) 48 g 3   Homeopathic Products (LEG CRAMP RELIEF PO) Take 2 tablets by mouth at bedtime. Hyland leg cramp     levothyroxine (SYNTHROID) 137 MCG tablet Take 1 tablet (137 mcg total) by mouth daily before breakfast. 90 tablet 3   losartan-hydrochlorothiazide (HYZAAR) 50-12.5 MG tablet Take 1 tablet by mouth daily. 90 tablet 3   magnesium oxide (MAG-OX) 400 MG tablet Take 400 mg by mouth at bedtime.      meloxicam (MOBIC) 15 MG tablet Take 1 tablet (15 mg total) by mouth daily. 90  tablet 3   omeprazole (PRILOSEC) 40 MG capsule Take 1 capsule (40 mg total) by mouth in the morning and at bedtime. 180 capsule 3   OXYGEN Inhale 3 L into the lungs at bedtime.     SUPER B COMPLEX/C PO Take 1 capsule by mouth at bedtime.     traMADol (ULTRAM) 50 MG tablet Take 100 mg by mouth 3 (three) times daily.     traZODone (DESYREL) 50 MG tablet Take 0.5-1 tablets (25-50 mg total) by mouth at bedtime as needed for sleep. 90 tablet 3   No facility-administered medications prior to visit.   Review of Systems  Constitutional:  Negative for chills, fever, malaise/fatigue and weight loss.  HENT:  Negative for congestion, sinus pain and sore throat.   Eyes: Negative.   Respiratory:  Positive for cough, shortness of breath and wheezing. Negative for hemoptysis and sputum production.   Cardiovascular:  Negative for chest pain, palpitations, orthopnea, claudication and leg swelling.  Gastrointestinal:  Negative for abdominal pain, heartburn, nausea and vomiting.  Genitourinary: Negative.   Musculoskeletal:  Negative for joint pain and myalgias.  Skin:  Negative for rash.  Neurological:  Negative for weakness.  Endo/Heme/Allergies: Negative.   Psychiatric/Behavioral: Negative.     Objective:   Vitals:   07/31/22 1432  BP: 114/68  Pulse: 92  Temp: 97.9 F (36.6 C)  TempSrc: Oral  SpO2: 94%  Weight: 204 lb 3.2 oz (92.6 kg)  Height: 5\' 9"  (1.753 m)   Physical Exam Constitutional:      General: He is not in acute distress. HENT:     Head: Normocephalic and atraumatic.  Eyes:     Extraocular Movements: Extraocular movements intact.     Conjunctiva/sclera: Conjunctivae normal.  Cardiovascular:     Rate and Rhythm: Normal rate and  regular rhythm.     Pulses: Normal pulses.     Heart sounds: Normal heart sounds. No murmur heard. Pulmonary:     Breath sounds: Decreased air movement present.  Musculoskeletal:     Right lower leg: No edema.     Left lower leg: No edema.   Lymphadenopathy:     Cervical: Cervical adenopathy present.  Skin:    General: Skin is warm and dry.  Neurological:     General: No focal deficit present.     Mental Status: He is alert.    CBC    Component Value Date/Time   WBC 12.3 (H) 03/08/2022 0949   RBC 4.77 03/08/2022 0949   HGB 14.9 03/08/2022 0949   HGB 15.2 02/15/2022 1447   HCT 44.6 03/08/2022 0949   HCT 44.6 02/15/2022 1447   PLT 176.0 03/08/2022 0949   PLT 210 02/15/2022 1447   MCV 93.6 03/08/2022 0949   MCV 91 02/15/2022 1447   MCH 31.0 02/15/2022 1447   MCH 32.3 11/03/2019 1140   MCHC 33.3 03/08/2022 0949   RDW 14.0 03/08/2022 0949   RDW 12.7 02/15/2022 1447   LYMPHSABS 4.2 (H) 03/08/2022 0949   LYMPHSABS 2.2 02/15/2022 1447   MONOABS 0.7 03/08/2022 0949   EOSABS 0.1 03/08/2022 0949   EOSABS 0.1 02/15/2022 1447   BASOSABS 0.0 03/08/2022 0949   BASOSABS 0.1 02/15/2022 1447      Latest Ref Rng & Units 02/15/2022    2:47 PM 08/14/2021    2:43 PM 02/13/2021    2:47 PM  BMP  Glucose 70 - 99 mg/dL 117  95  106   BUN 8 - 27 mg/dL 25  21  17    Creatinine 0.76 - 1.27 mg/dL 2.03  1.96  2.03   BUN/Creat Ratio 10 - 24 12  11  8    Sodium 134 - 144 mmol/L 140  141  142   Potassium 3.5 - 5.2 mmol/L 4.1  4.9  4.8   Chloride 96 - 106 mmol/L 104  106  108   CO2 20 - 29 mmol/L 22  21  18    Calcium 8.6 - 10.2 mg/dL 9.3  9.2  9.7     Chest imaging: CT Chest 03/19/22 1. Scattered areas of minimal subpleural ground-glass, right greater than left, nonspecific. Findings may reflect scarring. Difficult to exclude interstitial lung disease such as nonspecific interstitial pneumonitis. Findings are indeterminate for UIP per consensus guidelines: Diagnosis of Idiopathic Pulmonary Fibrosis: An Official ATS/ERS/JRS/ALAT Clinical Practice Guideline. [place], Iss 5, [phone], Dec 29 2016. 2. Aortic atherosclerosis (ICD10-I70.0). Coronary artery calcification. 3.  Emphysema  CXR 07/2019 The  heart size and mediastinal contours are within normal limits. Unchanged scarring of the upper lungs. The visualized skeletal structures are unremarkable.  PFT:    Latest Ref Rng & Units 03/08/2022    9:45 AM  PFT Results  FVC-Pre L 4.42   FVC-Predicted Pre % 114   FVC-Post L 4.53   FVC-Predicted Post % 117   Pre FEV1/FVC % % 72   Post FEV1/FCV % % 69   FEV1-Pre L 3.16   FEV1-Predicted Pre % 113   FEV1-Post L 3.11   DLCO uncorrected ml/min/mmHg 19.66   DLCO UNC% % 83   DLCO corrected ml/min/mmHg 19.66   DLCO COR %Predicted % 83   DLVA Predicted % 71   TLC L 7.93   TLC % Predicted % 119   RV % Predicted % 134  Labs:  Path:  Echo:  Heart Catheterization:  Assessment & Plan:   No diagnosis found.  Discussion: Antonio Bowen is a 77 year old male, former smoker with chronic hypoxemic respiratory failure, BOOP, GERD and hypertension who returns to pulmonary clinic for COPD.   His CT Chest scan and PFTs are overall stable since 2021. No significant bronchodilator response. He does not have significant elevation in eosinophils or IgE. We discussed that he would not likely benefit from trial of biologic at this time.  He is to continue symbicort twice daily.   We discussed potential for pulmonary rehab but he would like to start walking more on his own first.   Your Post-Operative Risk Stratification Intermediate risk 13.3% risk of in-hospital post-op pulmonary complications (composite including respiratory failure, respiratory infection, pleural effusion, atelectasis, pneumothorax, bronchospasm, aspiration pneumonitis)  [facility]  Follow up in 6 months.    Current Outpatient Medications:    acetaminophen (TYLENOL) 500 MG tablet, Take 1,000 mg by mouth 2 (two) times daily as needed (for pain.)., Disp: , Rfl:    albuterol (VENTOLIN HFA) 108 (90 Base) MCG/ACT inhaler, Inhale 1-2 puffs into the lungs every 4 (four) hours as needed for wheezing or  shortness of breath. INHALE TWO PUFFS INTO LUNGS EVERY 6 HOURS AS NEEDED FOR WHEEZING, Disp: 18 g, Rfl: 3   budesonide-formoterol (SYMBICORT) 160-4.5 MCG/ACT inhaler, Inhale 2 puffs into the lungs 2 (two) times daily. in the morning and at bedtime., Disp: 30.6 g, Rfl: 3   cetirizine (ZYRTEC) 10 MG tablet, Take 10 mg by mouth at bedtime. , Disp: , Rfl:    clopidogrel (PLAVIX) 75 MG tablet, TAKE 1 TABLET EVERY DAY, Disp: 90 tablet, Rfl: 3   fluticasone (FLONASE) 50 MCG/ACT nasal spray, USE 1 SPRAY IN EACH NOSTRIL TWICE DAILY AS NEEDED FOR ALLERGIES OR RHINITIS (Patient taking differently: Place 2 sprays into both nostrils 2 (two) times daily.), Disp: 48 g, Rfl: 3   Homeopathic Products (LEG CRAMP RELIEF PO), Take 2 tablets by mouth at bedtime. Hyland leg cramp, Disp: , Rfl:    levothyroxine (SYNTHROID) 137 MCG tablet, Take 1 tablet (137 mcg total) by mouth daily before breakfast., Disp: 90 tablet, Rfl: 3   losartan-hydrochlorothiazide (HYZAAR) 50-12.5 MG tablet, Take 1 tablet by mouth daily., Disp: 90 tablet, Rfl: 3   magnesium oxide (MAG-OX) 400 MG tablet, Take 400 mg by mouth at bedtime. , Disp: , Rfl:    meloxicam (MOBIC) 15 MG tablet, Take 1 tablet (15 mg total) by mouth daily., Disp: 90 tablet, Rfl: 3   omeprazole (PRILOSEC) 40 MG capsule, Take 1 capsule (40 mg total) by mouth in the morning and at bedtime., Disp: 180 capsule, Rfl: 3   OXYGEN, Inhale 3 L into the lungs at bedtime., Disp: , Rfl:    SUPER B COMPLEX/C PO, Take 1 capsule by mouth at bedtime., Disp: , Rfl:    traMADol (ULTRAM) 50 MG tablet, Take 100 mg by mouth 3 (three) times daily., Disp: , Rfl:    traZODone (DESYREL) 50 MG tablet, Take 0.5-1 tablets (25-50 mg total) by mouth at bedtime as needed for sleep., Disp: 90 tablet, Rfl: 3

## 2022-08-01 ENCOUNTER — Ambulatory Visit (INDEPENDENT_AMBULATORY_CARE_PROVIDER_SITE_OTHER): Payer: Medicare HMO | Admitting: Family Medicine

## 2022-08-01 ENCOUNTER — Encounter: Payer: Self-pay | Admitting: Pulmonary Disease

## 2022-08-01 ENCOUNTER — Encounter: Payer: Self-pay | Admitting: Family Medicine

## 2022-08-01 VITALS — BP 130/73 | HR 85 | Ht 69.0 in | Wt 203.0 lb

## 2022-08-01 DIAGNOSIS — Z01818 Encounter for other preprocedural examination: Secondary | ICD-10-CM

## 2022-08-01 DIAGNOSIS — Z136 Encounter for screening for cardiovascular disorders: Secondary | ICD-10-CM | POA: Diagnosis not present

## 2022-08-01 LAB — COAGUCHEK XS/INR WAIVED
INR: 1 (ref 0.9–1.1)
Prothrombin Time: 11.6 s

## 2022-08-01 NOTE — Progress Notes (Signed)
BP 130/73   Pulse 85   Ht 5\' 9"  (1.753 m)   Wt 203 lb (92.1 kg)   SpO2 95%   BMI 29.98 kg/m    Subjective:   Patient ID: Antonio Bowen, male    DOB: [birth year], 77 y.o.   MRN: [id number]  HPI: JERL NOWELS is a 77 y.o. male presenting on 08/01/2022 for Surgical Clearance   HPI Preoperative clearance Patient is coming in today for preoperative clearance.  He is seeing Dr. Sherley Bounds for his spinal surgery.  He still has shortness of breath and wheezing at his baseline, sees pulmonology for this and they already gave their assessment for the surgery.  He does currently have a loop recorder but denies any chest pain or palpitations.  Cardiology monitors this.  He does still smoke 1 pack/day and has smoked for 50 years.  He denies any chest pain.  He says his shortness of breath has not been any worse than it has been recently.  He does take inhalers for this.  He has back pain and arthritic pain in his knees as well.  Relevant past medical, surgical, family and social history reviewed and updated as indicated. Interim medical history since our last visit reviewed. Allergies and medications reviewed and updated.  Review of Systems  Constitutional:  Negative for chills and fever.  Eyes:  Negative for visual disturbance.  Respiratory:  Positive for cough, shortness of breath and wheezing.   Cardiovascular:  Negative for chest pain and leg swelling.  Musculoskeletal:  Positive for arthralgias and back pain. Negative for gait problem.  Skin:  Negative for rash.  All other systems reviewed and are negative.   Per HPI unless specifically indicated above   Allergies as of 08/01/2022       Reactions   Nuts Nausea And Vomiting   sweating   Other Nausea And Vomiting   Per patient, cannot take any narcotic. Nausea and vomiting sweating   Vicodin [hydrocodone-acetaminophen]    tachycardia   Codeine Nausea And Vomiting   Aspirin Other (See Comments)   Upset stomach, bleeding    Penicillins Hives, Rash   Has patient had a PCN reaction causing immediate rash, facial/tongue/throat swelling, SOB or lightheadedness with hypotension:No Has patient had a PCN reaction causing severe rash involving mucus membranes or skin necrosis:No Has patient had a PCN reaction that required hospitalization:No Has patient had a PCN reaction occurring within the last 10 years:Yes If all of the above answers are "NO", then may proceed with Cephalosporin use.        Medication List        Accurate as of August 01, 2022 11:21 AM. If you have any questions, ask your nurse or doctor.          acetaminophen 500 MG tablet Commonly known as: TYLENOL Take 1,000 mg by mouth 2 (two) times daily as needed (for pain.).   albuterol 108 (90 Base) MCG/ACT inhaler Commonly known as: Ventolin HFA Inhale 1-2 puffs into the lungs every 4 (four) hours as needed for wheezing or shortness of breath. INHALE TWO PUFFS INTO LUNGS EVERY 6 HOURS AS NEEDED FOR WHEEZING   budesonide-formoterol 160-4.5 MCG/ACT inhaler Commonly known as: Symbicort Inhale 2 puffs into the lungs 2 (two) times daily. in the morning and at bedtime.   cetirizine 10 MG tablet Commonly known as: ZYRTEC Take 10 mg by mouth at bedtime.   clopidogrel 75 MG tablet Commonly known as: PLAVIX TAKE 1 TABLET EVERY  DAY   fluticasone 50 MCG/ACT nasal spray Commonly known as: FLONASE USE 1 SPRAY IN EACH NOSTRIL TWICE DAILY AS NEEDED FOR ALLERGIES OR RHINITIS What changed: See the new instructions.   ipratropium-albuterol 0.5-2.5 (3) MG/3ML Soln Commonly known as: DUONEB Take 3 mLs by nebulization every 4 (four) hours as needed.   LEG CRAMP RELIEF PO Take 2 tablets by mouth at bedtime. Hyland leg cramp   levothyroxine 137 MCG tablet Commonly known as: SYNTHROID Take 1 tablet (137 mcg total) by mouth daily before breakfast.   losartan-hydrochlorothiazide 50-12.5 MG tablet Commonly known as: HYZAAR Take 1 tablet by mouth  daily.   magnesium oxide 400 MG tablet Commonly known as: MAG-OX Take 400 mg by mouth at bedtime.   meloxicam 15 MG tablet Commonly known as: MOBIC Take 1 tablet (15 mg total) by mouth daily.   omeprazole 40 MG capsule Commonly known as: PRILOSEC Take 1 capsule (40 mg total) by mouth in the morning and at bedtime.   OXYGEN Inhale 3 L into the lungs at bedtime.   SUPER B COMPLEX/C PO Take 1 capsule by mouth at bedtime.   traMADol 50 MG tablet Commonly known as: ULTRAM Take 100 mg by mouth 3 (three) times daily.   traZODone 50 MG tablet Commonly known as: DESYREL Take 0.5-1 tablets (25-50 mg total) by mouth at bedtime as needed for sleep.         Objective:   BP 130/73   Pulse 85   Ht 5\' 9"  (1.753 m)   Wt 203 lb (92.1 kg)   SpO2 95%   BMI 29.98 kg/m   Wt Readings from Last 3 Encounters:  08/01/22 203 lb (92.1 kg)  07/31/22 204 lb 3.2 oz (92.6 kg)  07/10/22 204 lb (92.5 kg)    Physical Exam Vitals and nursing note reviewed.  Constitutional:      General: He is not in acute distress.    Appearance: He is well-developed. He is not diaphoretic.  Eyes:     General: No scleral icterus.    Conjunctiva/sclera: Conjunctivae normal.  Neck:     Thyroid: No thyromegaly.  Cardiovascular:     Rate and Rhythm: Normal rate and regular rhythm.     Heart sounds: Normal heart sounds. No murmur heard. Pulmonary:     Effort: Pulmonary effort is normal. No respiratory distress.     Breath sounds: Wheezing present. No rhonchi or rales.  Chest:     Chest wall: No tenderness.  Musculoskeletal:        General: Normal range of motion.     Cervical back: Neck supple.  Lymphadenopathy:     Cervical: No cervical adenopathy.  Skin:    General: Skin is warm and dry.     Findings: No rash.  Neurological:     Mental Status: He is alert and oriented to person, place, and time.     Coordination: Coordination normal.  Psychiatric:        Behavior: Behavior normal.     EKG:  Normal sinus rhythm with occasional PAC and heart rate in the 70s  Assessment & Plan:   Problem List Items Addressed This Visit   None Visit Diagnoses     Preoperative clearance    -  Primary   Relevant Orders   EKG 12-Lead (Completed)   CBC with Differential/Platelet   CMP14+EGFR   Lipid panel   CoaguChek XS/INR Waived   TSH     Looks like pulmonology said he could be cleared  with intermediate risk, and will need cardiology assessment as well. We will do blood work today.   Follow up plan: Return if symptoms worsen or fail to improve.  Counseling provided for all of the vaccine components Orders Placed This Encounter  Procedures   CBC with Differential/Platelet   CMP14+EGFR   Lipid panel   CoaguChek XS/INR Waived   TSH   EKG 12-Lead    Caryl Pina, MD [facility] 08/01/2022, 11:21 AM

## 2022-08-02 ENCOUNTER — Telehealth: Payer: Self-pay | Admitting: *Deleted

## 2022-08-02 LAB — LIPID PANEL
Chol/HDL Ratio: 1.9 ratio (ref 0.0–5.0)
Cholesterol, Total: 91 mg/dL — ABNORMAL LOW (ref 100–199)
HDL: 49 mg/dL (ref >39)
LDL Chol Calc (NIH): 27 mg/dL (ref 0–99)
Triglycerides: 66 mg/dL (ref 0–149)
VLDL Cholesterol Cal: 15 mg/dL (ref 5–40)

## 2022-08-02 LAB — CBC WITH DIFFERENTIAL/PLATELET
Basophils Absolute: 0.1 10*3/uL (ref 0.0–0.2)
Basos: 1 %
EOS (ABSOLUTE): 0.1 10*3/uL (ref 0.0–0.4)
Eos: 1 %
Hematocrit: 48 % (ref 37.5–51.0)
Hemoglobin: 15.9 g/dL (ref 13.0–17.7)
Immature Grans (Abs): 0.1 10*3/uL (ref 0.0–0.1)
Immature Granulocytes: 1 %
Lymphocytes Absolute: 2.9 10*3/uL (ref 0.7–3.1)
Lymphs: 30 %
MCH: 30.1 pg (ref 26.6–33.0)
MCHC: 33.1 g/dL (ref 31.5–35.7)
MCV: 91 fL (ref 79–97)
Monocytes Absolute: 0.6 10*3/uL (ref 0.1–0.9)
Monocytes: 7 %
Neutrophils Absolute: 5.8 10*3/uL (ref 1.4–7.0)
Neutrophils: 60 %
Platelets: 200 10*3/uL (ref 150–450)
RBC: 5.29 x10E6/uL (ref 4.14–5.80)
RDW: 12.7 % (ref 11.6–15.4)
WBC: 9.5 10*3/uL (ref 3.4–10.8)

## 2022-08-02 LAB — CMP14+EGFR
ALT: 18 IU/L (ref 0–44)
AST: 19 IU/L (ref 0–40)
Albumin/Globulin Ratio: 2.3 — ABNORMAL HIGH (ref 1.2–2.2)
Albumin: 4.5 g/dL (ref 3.8–4.8)
Alkaline Phosphatase: 97 IU/L (ref 44–121)
BUN/Creatinine Ratio: 10 (ref 10–24)
BUN: 21 mg/dL (ref 8–27)
Bilirubin Total: 0.3 mg/dL (ref 0.0–1.2)
CO2: 20 mmol/L (ref 20–29)
Calcium: 9.3 mg/dL (ref 8.6–10.2)
Chloride: 104 mmol/L (ref 96–106)
Creatinine, Ser: 2.21 mg/dL — ABNORMAL HIGH (ref 0.76–1.27)
Globulin, Total: 2 g/dL (ref 1.5–4.5)
Glucose: 89 mg/dL (ref 70–99)
Potassium: 4.3 mmol/L (ref 3.5–5.2)
Sodium: 141 mmol/L (ref 134–144)
Total Protein: 6.5 g/dL (ref 6.0–8.5)
eGFR: 30 mL/min/{1.73_m2} — ABNORMAL LOW (ref >59)

## 2022-08-02 LAB — TSH: TSH: 1.38 u[IU]/mL (ref 0.450–4.500)

## 2022-08-02 NOTE — Telephone Encounter (Signed)
   Pre-operative Risk Assessment    Patient Name: Antonio Bowen  DOB: [birth year] MRN: [id number]      Request for Surgical Clearance    Procedure:   L4-5 HEMILAMINECTOMY / POSTEROLATERAL FUSION  Date of Surgery:  Clearance TBD                                 Surgeon:  Gerrit Halls Surgeon's Group or Practice Name:  [facility] Phone number:  [phone] Fax number:  [phone]   Type of Clearance Requested:   - Medical  - Pharmacy:  Hold Clopidogrel (Plavix) NOT INDICATED HOW LONG   Type of Anesthesia:  General    Additional requests/questions:    Astrid Divine   08/02/2022, 7:45 AM

## 2022-08-02 NOTE — Telephone Encounter (Signed)
Will send message to [place], [place] scheduler to call the pt with an appt for pre op clearance.

## 2022-08-02 NOTE — Telephone Encounter (Signed)
    Primary Cardiologist:None  Chart reviewed as part of pre-operative protocol coverage. Because of Frank Neer Tomaro's past medical history and time since last visit, he/she will require a follow-up office visit in order to better assess preoperative cardiovascular risk.  Pre-op covering staff: - Please schedule appointment and call patient to inform them. - Please contact requesting surgeon's office via preferred method (i.e, phone, fax) to inform them of need for appointment prior to surgery.  If applicable, this message will also be routed to pharmacy pool and/or primary cardiologist for input on holding anticoagulant/antiplatelet agent as requested below so that this information is available at time of patient's appointment.   Patient's Plavix is not prescribed by cardiology.  Recommendations for holding Plavix will need to come from prescribing provider.  Deberah Pelton, NP  08/02/2022, 4:04 PM

## 2022-08-03 NOTE — Telephone Encounter (Signed)
Pt has been scheduled as new pt to see Dr. Izora Ribas 08/06/22. I will update all parties involved.

## 2022-08-03 NOTE — Telephone Encounter (Signed)
[facility], [address]  [place], [place], [place] Cc: Wiliam Ke, RN Hey!  This patient has a loop for stroke so we only follow him remotely!  He needs to be scheduled with gen cards APP for clearance. (Per Boneta Lucks :))  Thank you, Virgina Evener  I d/w per op APP and he agrees new pt appt with gen card. I will see if ur scheduling team maybe able to help get a new pt appt for pre op clearance.

## 2022-08-06 ENCOUNTER — Ambulatory Visit: Payer: Medicare HMO

## 2022-08-06 ENCOUNTER — Encounter: Payer: Self-pay | Admitting: Internal Medicine

## 2022-08-06 ENCOUNTER — Ambulatory Visit: Payer: Medicare HMO | Attending: Internal Medicine | Admitting: Internal Medicine

## 2022-08-06 VITALS — BP 102/78 | HR 93 | Ht 69.0 in | Wt 203.6 lb

## 2022-08-06 DIAGNOSIS — I493 Ventricular premature depolarization: Secondary | ICD-10-CM | POA: Diagnosis not present

## 2022-08-06 DIAGNOSIS — Z01810 Encounter for preprocedural cardiovascular examination: Secondary | ICD-10-CM | POA: Insufficient documentation

## 2022-08-06 DIAGNOSIS — I2510 Atherosclerotic heart disease of native coronary artery without angina pectoris: Secondary | ICD-10-CM

## 2022-08-06 DIAGNOSIS — Z8673 Personal history of transient ischemic attack (TIA), and cerebral infarction without residual deficits: Secondary | ICD-10-CM

## 2022-08-06 DIAGNOSIS — I2584 Coronary atherosclerosis due to calcified coronary lesion: Secondary | ICD-10-CM

## 2022-08-06 DIAGNOSIS — I491 Atrial premature depolarization: Secondary | ICD-10-CM | POA: Diagnosis not present

## 2022-08-06 NOTE — Progress Notes (Signed)
Cardiology Office Note:    Date:  08/06/2022   ID:  Antonio Bowen, DOB [birth year], MRN [id number]  PCP:  [name], Elige Radon, MD   [facility] Providers Cardiologist:  Christell Constant, MD     Referring MD: [name], Elige Radon, MD   CC: Back surgery Consulted for the evaluation of pre-operative risk stratification at the behest of Dr. Louanne Skye  History of Present Illness:    Antonio Bowen is a 77 y.o. male with a hx of COPD and BOOP.  Seen in 2024 and was found to be intermittent pulmonary risk for a 2 hours L spice surgery with [facility]. Has prior history of stroke- in 2021 and ILR with no AF but with PACs and PVCs. 2002: normal LHC. 2016 normal echo.  Patient notes that he is doing well.   Was distantly seen by our group for ILR.  Has been lost to follow up He is surprisingly active, despite his COPD and BOOP; he is able to walk and go up stairs.  The biggest limitation in keeping up with his great grandchildren is his back pain  No chest pain or pressure .  No SOB/DOE only with stairs and no PND/Orthopnea.  No weight gain or leg swelling.  No palpitations or syncope .   Past Medical History:  Diagnosis Date   Allergic rhinitis    Arthritis    BOOP (bronchiolitis obliterans with organizing pneumonia)    Bronchitis    Colon polyps    COPD (chronic obstructive pulmonary disease)    Dyspnea    with exertion    Dysrhythmia    skipped beats- followed by Dr Clifton James    GERD (gastroesophageal reflux disease)    Hypertension    Hypothyroidism    Kidney stone    Low back pain    Oxygen dependent    uses 3L/[place] at nite occasional during day if needed    Stroke    hx of 2 strokes which patient did not know he had had    Thyroid disease    Tobacco dependence     Past Surgical History:  Procedure Laterality Date   BALLOON DILATION N/A 01/30/2022   Procedure: BALLOON DILATION;  Surgeon: Hilarie Fredrickson, MD;  Location: [facility];  Service:  Gastroenterology;  Laterality: N/A;   BIOPSY  08/18/2019   Procedure: BIOPSY;  Surgeon: Hilarie Fredrickson, MD;  Location: [facility];  Service: Endoscopy;;   broncoscopy      carpel tunnal     left   COLONOSCOPY     x 4   DUPUYTREN CONTRACTURE RELEASE     Left   ESOPHAGOGASTRODUODENOSCOPY (EGD) WITH PROPOFOL N/A 03/26/2016   Procedure: ESOPHAGOGASTRODUODENOSCOPY (EGD) WITH PROPOFOL;  Surgeon: Hilarie Fredrickson, MD;  Location: [facility];  Service: Endoscopy;  Laterality: N/A;   ESOPHAGOGASTRODUODENOSCOPY (EGD) WITH PROPOFOL N/A 08/18/2019   Procedure: ESOPHAGOGASTRODUODENOSCOPY (EGD) WITH PROPOFOL;  Surgeon: Hilarie Fredrickson, MD;  Location: [facility];  Service: Endoscopy;  Laterality: N/A;  Possible dilation   ESOPHAGOGASTRODUODENOSCOPY (EGD) WITH PROPOFOL N/A 01/30/2022   Procedure: ESOPHAGOGASTRODUODENOSCOPY (EGD) WITH PROPOFOL;  Surgeon: Hilarie Fredrickson, MD;  Location: [facility];  Service: Gastroenterology;  Laterality: N/A;   ESOPHAGOGASTRODUODENOSCOPY ENDOSCOPY     x 3   FACIAL FRACTURE SURGERY     LOOP RECORDER INSERTION N/A 11/05/2019   Procedure: LOOP RECORDER INSERTION;  Surgeon: Hillis Range, MD;  Location: [facility];  Service: Cardiovascular;  Laterality: N/A;   OTHER  SURGICAL HISTORY     facial surgery   SAVORY DILATION N/A 08/18/2019   Procedure: SAVORY DILATION;  Surgeon: Hilarie Fredrickson, MD;  Location: [facility];  Service: Endoscopy;  Laterality: N/A;   TOTAL HIP ARTHROPLASTY  01/2011   Right hip, [place]   ULNAR NERVE TRANSPOSITION     left    Current Medications: Current Meds  Medication Sig   acetaminophen (TYLENOL) 500 MG tablet Take 1,000 mg by mouth 2 (two) times daily as needed (for pain.).   albuterol (VENTOLIN HFA) 108 (90 Base) MCG/ACT inhaler Inhale 1-2 puffs into the lungs every 4 (four) hours as needed for wheezing or shortness of breath. INHALE TWO PUFFS INTO LUNGS EVERY 6 HOURS AS NEEDED FOR WHEEZING   budesonide-formoterol (SYMBICORT) 160-4.5  MCG/ACT inhaler Inhale 2 puffs into the lungs 2 (two) times daily. in the morning and at bedtime.   cetirizine (ZYRTEC) 10 MG tablet Take 10 mg by mouth at bedtime.    clopidogrel (PLAVIX) 75 MG tablet TAKE 1 TABLET EVERY DAY   fluticasone (FLONASE) 50 MCG/ACT nasal spray USE 1 SPRAY IN EACH NOSTRIL TWICE DAILY AS NEEDED FOR ALLERGIES OR RHINITIS (Patient taking differently: Place 2 sprays into both nostrils 2 (two) times daily.)   Homeopathic Products (LEG CRAMP RELIEF PO) Take 2 tablets by mouth at bedtime. [place] leg cramp   ipratropium-albuterol (DUONEB) 0.5-2.5 (3) MG/3ML SOLN Take 3 mLs by nebulization every 4 (four) hours as needed.   levothyroxine (SYNTHROID) 137 MCG tablet Take 1 tablet (137 mcg total) by mouth daily before breakfast.   losartan-hydrochlorothiazide (HYZAAR) 50-12.5 MG tablet Take 1 tablet by mouth daily.   magnesium oxide (MAG-OX) 400 MG tablet Take 400 mg by mouth at bedtime.    meloxicam (MOBIC) 15 MG tablet Take 1 tablet (15 mg total) by mouth daily.   omeprazole (PRILOSEC) 40 MG capsule Take 1 capsule (40 mg total) by mouth in the morning and at bedtime.   OXYGEN Inhale 3 L into the lungs at bedtime.   SUPER B COMPLEX/C PO Take 1 capsule by mouth at bedtime.   traMADol (ULTRAM) 50 MG tablet Take 100 mg by mouth 3 (three) times daily.   traZODone (DESYREL) 50 MG tablet Take 0.5-1 tablets (25-50 mg total) by mouth at bedtime as needed for sleep.     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, also worked in Materials engineer mills  Tobacco Use   Smoking status: Former    Packs/day: 1.00    Years: 50.00    Additional pack years: 0.00    Total pack  years: 50.00    Types: Cigarettes    Quit date: 07/30/2010    Years since quitting: 12.0   Smokeless tobacco: Current    Types: Snuff  Vaping Use   Vaping Use: Never used  Substance and Sexual Activity   Alcohol use: Yes    Comment: rare   Drug use: No   Sexual activity: Not on file  Other Topics Concern   Not on file  Social History Narrative   Not on file   Social Determinants of Health   Financial Resource Strain: Low Risk  (07/10/2022)  Overall Financial Resource Strain (CARDIA)    Difficulty of Paying Living Expenses: Not hard at all  Food Insecurity: No Food Insecurity (07/10/2022)   Hunger Vital Sign    Worried About Running Out of Food in the Last Year: Never true    Ran Out of Food in the Last Year: Never true  Transportation Needs: No Transportation Needs (07/06/2021)   PRAPARE - Administrator, Civil Service (Medical): No    Lack of Transportation (Non-Medical): No  Physical Activity: Inactive (07/10/2022)   Exercise Vital Sign    Days of Exercise per Week: 0 days    Minutes of Exercise per Session: 0 min  Stress: No Stress Concern Present (07/10/2022)   [employer] of Occupational Health - Occupational Stress Questionnaire    Feeling of Stress : Not at all  Social Connections: Moderately Isolated (07/10/2022)   Social Connection and Isolation Panel [NHANES]    Frequency of Communication with Friends and Family: More than three times a week    Frequency of Social Gatherings with Friends and Family: More than three times a week    Attends Religious Services: Never    Database administrator or Organizations: No    Attends Engineer, structural: Never    Marital Status: Married     Family History: The patient's family history includes Arthritis in his father; Cancer in his mother and paternal grandmother; Colon cancer in an other family member; Healthy in his daughter and son; Prostate cancer in his maternal grandfather; Stomach cancer in his  maternal grandmother; Stroke (age of onset: 63) in his father. There is no history of Pancreatic cancer or Esophageal cancer.  ROS:   Please see the history of present illness.     All other systems reviewed and are negative.  EKGs/Labs/Other Studies Reviewed:    The following studies were reviewed today:   EKG:  EKG is  ordered today.  The ekg ordered today demonstrates  08/01/22: SR with rare PAC  Cardiac Studies & Procedures       ECHOCARDIOGRAM  ECHOCARDIOGRAM COMPLETE 11/04/2019  Narrative ECHOCARDIOGRAM REPORT    Patient Name:   Antonio Bowen Date of Exam: 11/04/2019 Medical Rec #:  [id number]        Height:       69.0 in Accession #:    [id number]       Weight:       204.6 lb Date of Birth:  [birth year]         BSA:          2.086 m Patient Age:    74 years         BP:           110/97 mmHg Patient Gender: M                HR:           98 bpm. Exam Location:  Inpatient  Procedure: 2D Echo  Indications:    TIA  History:        Patient has prior history of Echocardiogram examinations, most recent 02/28/2015. COPD and Stroke; Risk Factors:Hypertension and Former Smoker. Bronchiolitis obliterans organizing pneumonia (BOOP). Dysrhythmia. tobacco dependence.  Sonographer:    Celene Skeen RDCS (AE) Referring Phys: [id number] [facility]   Sonographer Comments: No parasternal window, suboptimal apical window and Technically difficult study due to poor echo windows. Image acquisition challenging due to COPD, Image acquisition challenging due to respiratory motion  and restriced mobility. see comments IMPRESSIONS   1. Limited echo no para sternal windows. 2. Left ventricular ejection fraction, by estimation, is 60 to 65%. The left ventricle has normal function. The left ventricle has no regional wall motion abnormalities. Left ventricular diastolic parameters are indeterminate. 3. Right ventricular systolic function is normal. The right ventricular size is normal. 4. The  mitral valve is normal in structure. not seen well mitral valve regurgitation. No evidence of mitral stenosis. 5. Tricuspid valve regurgitation not seen well. 6. The aortic valve was not well visualized. Aortic valve regurgitation not seen well. not seen well. 7. The inferior vena cava is normal in size with greater than 50% respiratory variability, suggesting right atrial pressure of 3 mmHg.  FINDINGS Left Ventricle: Left ventricular ejection fraction, by estimation, is 60 to 65%. The left ventricle has normal function. The left ventricle has no regional wall motion abnormalities. Definity contrast agent was given IV to delineate the left ventricular endocardial borders. The left ventricular internal cavity size was normal in size. There is no left ventricular hypertrophy. Left ventricular diastolic parameters are indeterminate.  Right Ventricle: The right ventricular size is normal. No increase in right ventricular wall thickness. Right ventricular systolic function is normal.  Left Atrium: Left atrial size was normal in size.  Right Atrium: Right atrial size was normal in size.  Pericardium: There is no evidence of pericardial effusion.  Mitral Valve: The mitral valve is normal in structure. Normal mobility of the mitral valve leaflets. Not seen well mitral valve regurgitation. No evidence of mitral valve stenosis.  Tricuspid Valve: The tricuspid valve is not well visualized. Tricuspid valve regurgitation not seen well. No evidence of tricuspid stenosis.  Aortic Valve: The aortic valve was not well visualized. Aortic valve regurgitation not seen well. Not seen well.  Pulmonic Valve: The pulmonic valve was normal in structure. Pulmonic valve regurgitation is not visualized. No evidence of pulmonic stenosis.  Aorta: The aortic root is normal in size and structure.  Venous: The inferior vena cava is normal in size with greater than 50% respiratory variability, suggesting right atrial  pressure of 3 mmHg.  IAS/Shunts: The interatrial septum was not well visualized.  Additional Comments: Limited echo no para sternal windows.    Diastology LV e' medial: 5.33 cm/s   AORTIC VALVE LVOT Vmax:   57.40 cm/s LVOT Vmean:  37.600 cm/s LVOT VTI:    0.104 m   SHUNTS Systemic VTI: 0.10 m  Charlton Haws MD Electronically signed by Charlton Haws MD Signature Date/Time: 11/04/2019/4:28:59 PM    Final              Recent Labs: 08/01/2022: ALT 18; BUN 21; Creatinine, Ser 2.21; Hemoglobin 15.9; Platelets 200; Potassium 4.3; Sodium 141; TSH 1.380  Recent Lipid Panel    Component Value Date/Time   CHOL 91 (L) 08/01/2022 1135   TRIG 66 08/01/2022 1135   TRIG 79 05/06/2013 1130   HDL 49 08/01/2022 1135   HDL 54 05/06/2013 1130   CHOLHDL 1.9 08/01/2022 1135   CHOLHDL 1.9 11/03/2019 1140   VLDL 12 11/03/2019 1140   LDLCALC 27 08/01/2022 1135   LDLCALC 34 05/06/2013 1130        Physical Exam:    VS:  BP 102/78   Pulse 93   Ht 5\' 9"  (1.753 m)   Wt 203 lb 9.6 oz (92.4 kg)   SpO2 95%   BMI 30.07 kg/m     Wt Readings from Last 3 Encounters:  08/06/22 203 lb 9.6 oz (92.4 kg)  08/01/22 203 lb (92.1 kg)  07/31/22 204 lb 3.2 oz (92.6 kg)     GEN:  Well nourished, well developed in no acute distress; Hard of hearing CARDIAC: RRR, no murmurs, rubs, gallops RESPIRATORY:  Clear to auscultation without rales, wheezing or rhonchi  ABDOMEN: Soft, non-tender, non-distended MUSCULOSKELETAL:  No edema; No deformity  SKIN: Warm and dry NEUROLOGIC:  Alert and oriented x 3 PSYCHIATRIC:  Normal affect   ASSESSMENT:    1. Preoperative cardiovascular examination   2. Coronary artery calcification   3. History of CVA (cerebrovascular accident)   4. PAC (premature atrial contraction)   5. PVC (premature ventricular contraction)    PLAN:    Preoperative Risk Assessment  - The Revised Cardiac Risk Index = 2 due to  Prior CVA and Scr >2 - this which equates to 6.6%:  moderate riskof perioperative myocardial infarction, pulmonary edema, ventricular fibrillation, cardiac arrest, or complete heart block.  - Greater than 4  functional mets - No further cardiac testing is recommended prior to surgery.  - The patient may proceed to surgery at acceptable risk.   - plavix can be held as needed (indication is not cardiac by for cryptogenic stroke)  Aortic atherosclerosis Mild CAC RCA and LCX - plavix continued post surgery (when able) - LDL at goal  PVCs PAC - asymptomatic on no   Hx of cryptogenic stroke - if he would like ILR out we can arrange this  Copy of our note to [facility]  One year with me unless issues with surgery       Medication Adjustments/Labs and Tests Ordered: Current medicines are reviewed at length with the patient today.  Concerns regarding medicines are outlined above.  No orders of the defined types were placed in this encounter.  No orders of the defined types were placed in this encounter.   Patient Instructions  Medication Instructions:  Your physician recommends that you continue on your current medications as directed. Please refer to the Current Medication list given to you today.  *If you need a refill on your cardiac medications before your next appointment, please call your pharmacy*   Lab Work: NONE If you have labs (blood work) drawn today and your tests are completely normal, you will receive your results only by: MyChart Message (if you have MyChart) OR A paper copy in the mail If you have any lab test that is abnormal or we need to change your treatment, we will call you to review the results.   Testing/Procedures: NONE   Follow-Up: At [facility], you and your health needs are our priority.  As part of our continuing mission to provide you with exceptional heart care, we have created designated Provider Care Teams.  These Care Teams include your primary Cardiologist (physician)  and Advanced Practice Providers (APPs -  Physician Assistants and Nurse Practitioners) who all work together to provide you with the care you need, when you need it.   Your next appointment:   1 year(s)  Provider:   Riley LamMahesh Odyn Turko, MD       Signed, Christell ConstantMahesh A Frankye Schwegel, MD  08/06/2022 4:12 PM    [facility]

## 2022-08-06 NOTE — Patient Instructions (Signed)
Medication Instructions:  Your physician recommends that you continue on your current medications as directed. Please refer to the Current Medication list given to you today.  *If you need a refill on your cardiac medications before your next appointment, please call your pharmacy*   Lab Work: NONE If you have labs (blood work) drawn today and your tests are completely normal, you will receive your results only by: MyChart Message (if you have MyChart) OR A paper copy in the mail If you have any lab test that is abnormal or we need to change your treatment, we will call you to review the results.   Testing/Procedures: NONE   Follow-Up: At  HeartCare, you and your health needs are our priority.  As part of our continuing mission to provide you with exceptional heart care, we have created designated Provider Care Teams.  These Care Teams include your primary Cardiologist (physician) and Advanced Practice Providers (APPs -  Physician Assistants and Nurse Practitioners) who all work together to provide you with the care you need, when you need it.   Your next appointment:   1 year(s)  Provider:   Mahesh Chandrasekhar, MD     

## 2022-08-08 ENCOUNTER — Other Ambulatory Visit: Payer: Self-pay | Admitting: Neurological Surgery

## 2022-08-08 ENCOUNTER — Telehealth: Payer: Self-pay | Admitting: Family Medicine

## 2022-08-08 ENCOUNTER — Telehealth: Payer: Self-pay | Admitting: Pulmonary Disease

## 2022-08-08 NOTE — Telephone Encounter (Signed)
Faxed note from Dr. Izora Ribas to requesting surgeon.   DW

## 2022-08-08 NOTE — Telephone Encounter (Signed)
Called to let our office know his surgery is schedule for 4/19 at 1pm. Please advise

## 2022-08-08 NOTE — Telephone Encounter (Signed)
Patient states having back surgery on 08/17/2022 at 1:00 pm. Would like to let Dr. Francine Graven know. Patient phone number is [phone] and [phone].

## 2022-08-08 NOTE — Telephone Encounter (Signed)
Pt informed surgical clearance form has been faxed to his surgeon. They have been made aware that he may stop Plavix and Meloxicam 5d prior to his procedure. Pt understood and states that his procedure is scheduled for 4/19. He was supposed to come in and see [name] that day for knee injections but has canceled. Pt would like to wait and see how he does with the back surgery before rescheduling. Pt will call back about one month after surgery with an up date and schedule a follow up with  [name].

## 2022-08-08 NOTE — Telephone Encounter (Signed)
Dr. Francine Graven just an FYI pt wanted you to know his surgery date is 08/17/2022 at 1:00pm

## 2022-08-08 NOTE — Telephone Encounter (Signed)
Thank you :)

## 2022-08-09 NOTE — Telephone Encounter (Signed)
OV notes and clearance form have been faxed back to [facility]. Nothing further needed at this time.

## 2022-08-14 NOTE — Pre-Procedure Instructions (Signed)
Surgical Instructions    Your procedure is scheduled on August 17, 2022.  Report to [facility] Main Entrance "A" at 8:30 A.M., then check in with the Admitting office.  Call this number if you have problems the morning of surgery:  [phone]  If you have any questions prior to your surgery date call [phone]: Open Monday-Friday 8am-4pm If you experience any cold or flu symptoms such as cough, fever, chills, shortness of breath, etc. between now and your scheduled surgery, please notify us at the above number.     Remember:  Do not eat or drink after midnight the night before your surgery    Take these medicines the morning of surgery with A SIP OF WATER:  budesonide-formoterol (SYMBICORT) inhaler   fluticasone (FLONASE) nasal spray   levothyroxine (SYNTHROID)   omeprazole (PRILOSEC)    May take these medicines IF NEEDED:  acetaminophen (TYLENOL)   albuterol (VENTOLIN HFA) inhaler   ipratropium-albuterol (DUONEB) nebulizer  traMADol (ULTRAM)     Follow your surgeon's instructions on when to stop clopidogrel (PLAVIX).  If no instructions were given by your surgeon then you will need to call the office to get those instructions.     As of today, STOP taking any Aspirin (unless otherwise instructed by your surgeon) Aleve, Naproxen, Ibuprofen, Motrin, Advil, Goody's, BC's, all herbal medications, fish oil, and all vitamins. This includes your medication: meloxicam (MOBIC)                      Do NOT Smoke (Tobacco/Vaping) for 24 hours prior to your procedure.  If you use a CPAP at night, you may bring your mask/headgear for your overnight stay.   Contacts, glasses, piercing's, hearing aid's, dentures or partials may not be worn into surgery, please bring cases for these belongings.    For patients admitted to the hospital, discharge time will be determined by your treatment team.   Patients discharged the day of surgery will not be allowed to drive home, and someone needs  to stay with them for 24 hours.  SURGICAL WAITING ROOM VISITATION Patients having surgery or a procedure may have no more than 2 support people in the waiting area - these visitors may rotate.   Children under the age of 74 must have an adult with them who is not the patient. If the patient needs to stay at the hospital during part of their recovery, the visitor guidelines for inpatient rooms apply. Pre-op nurse will coordinate an appropriate time for 1 support person to accompany patient in pre-op.  This support person may not rotate.   Please refer to the [facility] website for the visitor guidelines for Inpatients (after your surgery is over and you are in a regular room).   If you received a COVID test during your pre-op visit  it is requested that you wear a mask when out in public, stay away from anyone that may not be feeling well and notify your surgeon if you develop symptoms. If you have been in contact with anyone that has tested positive in the last 10 days please notify you surgeon.  Please follow the instructions on the handout you received at your pre-admission appointment about preparing for your upcoming surgery using the CHG surgical soap. If you have any questions or concerns, please call one of the numbers listed on the first page of this paperwork.

## 2022-08-15 ENCOUNTER — Encounter (HOSPITAL_COMMUNITY): Payer: Self-pay

## 2022-08-15 ENCOUNTER — Encounter (HOSPITAL_COMMUNITY)
Admission: RE | Admit: 2022-08-15 | Discharge: 2022-08-15 | Disposition: A | Discharge status: Home / Self Care | Payer: Medicare HMO | Source: Ambulatory Visit | Attending: Neurological Surgery | Admitting: Neurological Surgery

## 2022-08-15 ENCOUNTER — Other Ambulatory Visit: Payer: Self-pay

## 2022-08-15 VITALS — BP 104/63 | HR 103 | Temp 98.2°F | Resp 19 | Ht 69.0 in | Wt 200.0 lb

## 2022-08-15 DIAGNOSIS — I493 Ventricular premature depolarization: Secondary | ICD-10-CM | POA: Insufficient documentation

## 2022-08-15 DIAGNOSIS — I2510 Atherosclerotic heart disease of native coronary artery without angina pectoris: Secondary | ICD-10-CM | POA: Insufficient documentation

## 2022-08-15 DIAGNOSIS — J9611 Chronic respiratory failure with hypoxia: Secondary | ICD-10-CM | POA: Diagnosis not present

## 2022-08-15 DIAGNOSIS — Z87891 Personal history of nicotine dependence: Secondary | ICD-10-CM | POA: Insufficient documentation

## 2022-08-15 DIAGNOSIS — J84112 Idiopathic pulmonary fibrosis: Secondary | ICD-10-CM | POA: Insufficient documentation

## 2022-08-15 DIAGNOSIS — Z01818 Encounter for other preprocedural examination: Secondary | ICD-10-CM

## 2022-08-15 DIAGNOSIS — Z7901 Long term (current) use of anticoagulants: Secondary | ICD-10-CM | POA: Diagnosis not present

## 2022-08-15 DIAGNOSIS — Z8673 Personal history of transient ischemic attack (TIA), and cerebral infarction without residual deficits: Secondary | ICD-10-CM | POA: Diagnosis not present

## 2022-08-15 DIAGNOSIS — J439 Emphysema, unspecified: Secondary | ICD-10-CM | POA: Insufficient documentation

## 2022-08-15 DIAGNOSIS — I700 Atherosclerosis of aorta: Secondary | ICD-10-CM | POA: Diagnosis not present

## 2022-08-15 DIAGNOSIS — Z01812 Encounter for preprocedural laboratory examination: Secondary | ICD-10-CM | POA: Diagnosis not present

## 2022-08-15 DIAGNOSIS — Z9221 Personal history of antineoplastic chemotherapy: Secondary | ICD-10-CM

## 2022-08-15 DIAGNOSIS — M4316 Spondylolisthesis, lumbar region: Secondary | ICD-10-CM | POA: Diagnosis not present

## 2022-08-15 HISTORY — DX: Other specified postprocedural states: Z98.890

## 2022-08-15 HISTORY — DX: Personal history of other diseases of the digestive system: Z87.19

## 2022-08-15 LAB — PROTIME-INR
INR: 1 (ref 0.8–1.2)
Prothrombin Time: 12.8 seconds (ref 11.4–15.2)

## 2022-08-15 LAB — TYPE AND SCREEN
ABO/RH(D): A POS
Antibody Screen: NEGATIVE

## 2022-08-15 LAB — SURGICAL PCR SCREEN
MRSA, PCR: NEGATIVE
Staphylococcus aureus: POSITIVE — AB

## 2022-08-15 NOTE — Progress Notes (Signed)
PCP - Dr. Ivin Booty [name] Cardiologist - Dr. Verne Carrow  PPM/ICD - Denies Device Orders - n/a Rep Notified - n/a  Chest x-ray - n/a EKG - 08/01/2022 Stress Test - Had stress test prior to cardiac cath ECHO - 11/04/2019 Cardiac Cath - 07/03/2000  Sleep Study - Denies CPAP - n/a  No DM  Last dose of GLP1 agonist- n/a  GLP1 instructions: n/a  Blood Thinner Instructions: Per surgeons instructions, pts last dose of Plavix was April 10th Aspirin Instructions: n/a  NPO after midnight  COVID TEST- n/a   Anesthesia review: Yes. Cardiac Clearance. Pt does wear 3L O2 [place] every night. He says he very infrequently needs it during the day unless he has been moving around more than usual (I.e. playing golf or working a lot around the house). He was SOB when he arrived after walking from the Main Entrance to PAT waiting room, but once he got a wheelchair and rested he was back to baseline  Patient denies shortness of breath, fever, cough and chest pain at PAT appointment. Pt does endorse a runny nose due to seasonal allergies. Pt instructed to notify hospital if pt develops any other symptoms such as productive cough, fever, and/or sore throat.   All instructions explained to the patient, with a verbal understanding of the material. Patient agrees to go over the instructions while at home for a better understanding. Patient also instructed to self quarantine after being tested for COVID-19. The opportunity to ask questions was provided.

## 2022-08-16 NOTE — Progress Notes (Signed)
Anesthesia Chart Review:  Follows with cardiology for history of CVA 2021 with subsequent ILR showing no A-fib but with PACs and PVCs.  Echocardiogram 2021 was normal.  Remotely, he had a normal cath in 2002.  He was seen by cardiologist Dr. Izora Ribas 08/06/2022 for preop evaluation.  Per note, "- The Revised Cardiac Risk Index = 2 due to Prior CVA and Scr >2- this which equates to 6.6%: moderate riskof perioperative myocardial infarction, pulmonary edema, ventricular fibrillation, cardiac arrest, or complete heart block. - Greater than 4  functional mets- No further cardiac testing is recommended prior to surgery. - The patient may proceed to surgery at acceptable risk.  - plavix can be held as needed (indication is not cardiac by for cryptogenic stroke)."  Patient reports last dose Plavix 08/08/2022.  Follows with pulmonology for history of former smoker with associated COPD and chronic hypoxemic respiratory failure (3 L O2 via [place] nightly, as needed during the day), BOOP, GERD.  Seen by pulmonologist Dr. Francine Graven 07/31/2022 for preop evaluation.  Per note, "His CT Chest scan and PFTs are overall stable since 2021. No significant bronchodilator response. He does not have significant elevation in eosinophils or IgE. We discussed that he would not likely benefit from trial of biologic at this time. He is to continue symbicort twice daily. We have provided him with samples of brezri today per his request to try it again.  He is to continue increasing his physical activity level with walking daily. In regards to his surgical planning, based on ARISCAT index he is intermediate risk, 13.3% risk of in-hospital post-op pulmonary complications (composite including respiratory failure, respiratory infection, pleural effusion, atelectasis, pneumothorax, bronchospasm, aspiration pneumonitis). Recommend he be extubated to bipap/cpap in the PACU for a period of time and maintained on budesonide/brovana/yupelri nebs while in  the hospital."  CMP and CBC from 08/01/2022 reviewed, creatinine elevated at 2.21 consistent with history of CKD, otherwise unremarkable.  PCP Dr. Louanne Skye commented on this result stating, "Patient's kidney function is slightly worse but not a significant jump, essentially consider stable but make sure he just stays hydrated well.  They may need to give some extra IV fluids during his surgery.  Cholesterol hemoglobin platelets and blood counts and clotting factors and thyroid all look good.  Essentially cleared for surgery from our end "  EKG 08/01/2022: Sinus Rhythm -occasional PAC.  Rate 81.-Nonspecific ST depression -Nondiagnostic.   CT chest high-resolution 03/19/2022: IMPRESSION: 1. Scattered areas of minimal subpleural ground-glass, right greater than left, nonspecific. Findings may reflect scarring. Difficult to exclude interstitial lung disease such as nonspecific interstitial pneumonitis. Findings are indeterminate for UIP per consensus guidelines: Diagnosis of Idiopathic Pulmonary Fibrosis: An Official ATS/ERS/JRS/ALAT Clinical Practice Guideline. Am Rosezetta Schlatter Crit Care Med Vol 198, Iss 5, [phone], Dec 29 2016. 2. Aortic atherosclerosis (ICD10-I70.0). Coronary artery calcification. 3.  Emphysema (ICD10-J43.9).  TTE 11/04/2019:  1. Limited echo no para sternal windows.   2. Left ventricular ejection fraction, by estimation, is 60 to 65%. The  left ventricle has normal function. The left ventricle has no regional  wall motion abnormalities. Left ventricular diastolic parameters are  indeterminate.   3. Right ventricular systolic function is normal. The right ventricular  size is normal.   4. The mitral valve is normal in structure. not seen well mitral valve  regurgitation. No evidence of mitral stenosis.   5. Tricuspid valve regurgitation not seen well.   6. The aortic valve was not well visualized. Aortic valve regurgitation  not  seen well. not seen well.   7. The inferior vena  cava is normal in size with greater than 50%  respiratory variability, suggesting right atrial pressure of 3 mmHg.    Zannie Cove [facility]/Anesthesiology Phone [phone] 08/16/2022 11:40 AM

## 2022-08-16 NOTE — Anesthesia Preprocedure Evaluation (Addendum)
Anesthesia Evaluation    Reviewed: Allergy & Precautions, NPO status , Patient's Chart, lab work & pertinent test results  History of Anesthesia Complications (+) PONV and history of anesthetic complications  Airway Mallampati: III  TM Distance: >3 FB Neck ROM: Full    Dental  (+) Edentulous Upper, Edentulous Lower, Dental Advisory Given   Pulmonary shortness of breath and Long-Term Oxygen Therapy, COPD,  COPD inhaler and oxygen dependent, former smoker 3 lpm at night   breath sounds clear to auscultation       Cardiovascular hypertension, Pt. on medications (-) angina + CAD  (-) Past MI  Rhythm:Regular   1. Limited echo no para sternal windows.   2. Left ventricular ejection fraction, by estimation, is 60 to 65%. The  left ventricle has normal function. The left ventricle has no regional  wall motion abnormalities. Left ventricular diastolic parameters are  indeterminate.   3. Right ventricular systolic function is normal. The right ventricular  size is normal.   4. The mitral valve is normal in structure. not seen well mitral valve  regurgitation. No evidence of mitral stenosis.   5. Tricuspid valve regurgitation not seen well.   6. The aortic valve was not well visualized. Aortic valve regurgitation  not seen well. not seen well.   7. The inferior vena cava is normal in size with greater than 50%  respiratory variability, suggesting right atrial pressure of 3 mmHg.     Neuro/Psych 2016/2021 left arm hand symptoms  Neuromuscular disease CVA, Residual Symptoms  negative psych ROS   GI/Hepatic hiatal hernia,GERD  ,,  Endo/Other  Hypothyroidism    Renal/GU CRFRenal diseaseLab Results      Component                Value               Date                      CREATININE               2.21 (H)            08/01/2022                Musculoskeletal  (+) Arthritis ,    Abdominal   Peds  Hematology negative hematology  ROS (+)   Anesthesia Other Findings   Reproductive/Obstetrics                             Anesthesia Physical Anesthesia Plan  ASA: 3  Anesthesia Plan: General   Post-op Pain Management:    Induction: Intravenous  PONV Risk Score and Plan: 3 and Ondansetron and Dexamethasone  Airway Management Planned: Oral ETT  Additional Equipment: None  Intra-op Plan:   Post-operative Plan: Extubation in OR  Informed Consent: I have reviewed the patients History and Physical, chart, labs and discussed the procedure including the risks, benefits and alternatives for the proposed anesthesia with the patient or authorized representative who has indicated his/her understanding and acceptance.       Plan Discussed with: CRNA  Anesthesia Plan Comments: (PAT note by Antionette Poles, PA-C:  Follows with cardiology for history of CVA 2021 with subsequent ILR showing no A-fib but with PACs and PVCs.  Echocardiogram 2021 was normal.  Remotely, he had a normal cath in 2002.  He was seen by cardiologist Dr. Izora Ribas 08/06/2022 for preop evaluation.  Per note, "- The Revised Cardiac Risk Index = 2 due to Prior CVA and Scr >2- this which equates to 6.6%: moderate riskof perioperative myocardial infarction, pulmonary edema, ventricular fibrillation, cardiac arrest, or complete heart block. - Greater than 4  functional mets- No further cardiac testing is recommended prior to surgery. - The patient may proceed to surgery at acceptable risk.  - plavix can be held as needed (indication is not cardiac by for cryptogenic stroke)."  Patient reports last dose Plavix 08/08/2022.  Follows with pulmonology for history of former smoker with associated COPD and chronic hypoxemic respiratory failure (3 L O2 via [place] nightly, as needed during the day), BOOP, GERD.  Seen by pulmonologist Dr. Francine Graven 07/31/2022 for preop evaluation.  Per note, "His CT Chest scan and PFTs are overall stable since 2021. No  significant bronchodilator response. He does not have significant elevation in eosinophils or IgE. We discussed that he would not likely benefit from trial of biologic at this time. He is to continue symbicort twice daily. We have provided him with samples of brezri today per his request to try it again.  He is to continue increasing his physical activity level with walking daily. In regards to his surgical planning, based on ARISCAT index he is intermediate risk, 13.3% risk of in-hospital post-op pulmonary complications (composite including respiratory failure, respiratory infection, pleural effusion, atelectasis, pneumothorax, bronchospasm, aspiration pneumonitis). Recommend he be extubated to bipap/cpap in the PACU for a period of time and maintained on budesonide/brovana/yupelri nebs while in the hospital."  CMP and CBC from 08/01/2022 reviewed, creatinine elevated at 2.21 consistent with history of CKD, otherwise unremarkable.  PCP Dr. Louanne Skye commented on this result stating, "Patient's kidney function is slightly worse but not a significant jump, essentially consider stable but make sure he just stays hydrated well. They may need to give some extra IV fluids during his surgery. Cholesterol hemoglobin platelets and blood counts and clotting factors and thyroid all look good. Essentially cleared for surgery from our end "  EKG 08/01/2022: Sinus Rhythm -occasional PAC.  Rate 81.-Nonspecific ST depression -Nondiagnostic.   CT chest high-resolution 03/19/2022: IMPRESSION: 1. Scattered areas of minimal subpleural ground-glass, right greater than left, nonspecific. Findings may reflect scarring. Difficult to exclude interstitial lung disease such as nonspecific interstitial pneumonitis. Findings are indeterminate for UIP per consensus guidelines: Diagnosis of Idiopathic Pulmonary Fibrosis: An Official ATS/ERS/JRS/ALAT Clinical Practice Guideline. Am Rosezetta Schlatter Crit Care Med Vol 198, Iss 5, [phone],  Dec 29 2016. 2. Aortic atherosclerosis (ICD10-I70.0). Coronary artery calcification. 3.  Emphysema (ICD10-J43.9).  TTE 11/04/2019: 1. Limited echo no para sternal windows.  2. Left ventricular ejection fraction, by estimation, is 60 to 65%. The  left ventricle has normal function. The left ventricle has no regional  wall motion abnormalities. Left ventricular diastolic parameters are  indeterminate.  3. Right ventricular systolic function is normal. The right ventricular  size is normal.  4. The mitral valve is normal in structure. not seen well mitral valve  regurgitation. No evidence of mitral stenosis.  5. Tricuspid valve regurgitation not seen well.  6. The aortic valve was not well visualized. Aortic valve regurgitation  not seen well. not seen well.  7. The inferior vena cava is normal in size with greater than 50%  respiratory variability, suggesting right atrial pressure of 3 mmHg.   )        Anesthesia Quick Evaluation

## 2022-08-17 ENCOUNTER — Encounter (HOSPITAL_COMMUNITY): Payer: Self-pay | Admitting: Neurological Surgery

## 2022-08-17 ENCOUNTER — Ambulatory Visit (HOSPITAL_BASED_OUTPATIENT_CLINIC_OR_DEPARTMENT_OTHER): Payer: Medicare HMO | Admitting: Anesthesiology

## 2022-08-17 ENCOUNTER — Other Ambulatory Visit: Payer: Self-pay

## 2022-08-17 ENCOUNTER — Encounter (HOSPITAL_COMMUNITY)
Admission: RE | Disposition: A | Discharge status: Home / Self Care | Payer: Self-pay | Source: Home / Self Care | Attending: Neurological Surgery

## 2022-08-17 ENCOUNTER — Observation Stay (HOSPITAL_COMMUNITY)
Admission: RE | Admit: 2022-08-17 | Discharge: 2022-08-17 | Disposition: A | Discharge status: Home / Self Care | Payer: Medicare HMO | Attending: Neurological Surgery | Admitting: Neurological Surgery

## 2022-08-17 ENCOUNTER — Ambulatory Visit (HOSPITAL_COMMUNITY): Payer: Medicare HMO

## 2022-08-17 ENCOUNTER — Ambulatory Visit: Payer: Medicare HMO | Admitting: Family Medicine

## 2022-08-17 ENCOUNTER — Ambulatory Visit (HOSPITAL_COMMUNITY): Payer: Medicare HMO | Admitting: Physician Assistant

## 2022-08-17 DIAGNOSIS — Z96641 Presence of right artificial hip joint: Secondary | ICD-10-CM | POA: Diagnosis not present

## 2022-08-17 DIAGNOSIS — I2510 Atherosclerotic heart disease of native coronary artery without angina pectoris: Secondary | ICD-10-CM

## 2022-08-17 DIAGNOSIS — M47816 Spondylosis without myelopathy or radiculopathy, lumbar region: Secondary | ICD-10-CM | POA: Insufficient documentation

## 2022-08-17 DIAGNOSIS — J449 Chronic obstructive pulmonary disease, unspecified: Secondary | ICD-10-CM | POA: Insufficient documentation

## 2022-08-17 DIAGNOSIS — Z8673 Personal history of transient ischemic attack (TIA), and cerebral infarction without residual deficits: Secondary | ICD-10-CM | POA: Insufficient documentation

## 2022-08-17 DIAGNOSIS — E039 Hypothyroidism, unspecified: Secondary | ICD-10-CM | POA: Diagnosis not present

## 2022-08-17 DIAGNOSIS — Z87891 Personal history of nicotine dependence: Secondary | ICD-10-CM | POA: Insufficient documentation

## 2022-08-17 DIAGNOSIS — N189 Chronic kidney disease, unspecified: Secondary | ICD-10-CM | POA: Diagnosis not present

## 2022-08-17 DIAGNOSIS — M4316 Spondylolisthesis, lumbar region: Principal | ICD-10-CM | POA: Insufficient documentation

## 2022-08-17 DIAGNOSIS — I10 Essential (primary) hypertension: Secondary | ICD-10-CM | POA: Diagnosis not present

## 2022-08-17 DIAGNOSIS — M48061 Spinal stenosis, lumbar region without neurogenic claudication: Secondary | ICD-10-CM | POA: Insufficient documentation

## 2022-08-17 DIAGNOSIS — Z79899 Other long term (current) drug therapy: Secondary | ICD-10-CM | POA: Insufficient documentation

## 2022-08-17 DIAGNOSIS — M5416 Radiculopathy, lumbar region: Secondary | ICD-10-CM | POA: Diagnosis not present

## 2022-08-17 DIAGNOSIS — Z981 Arthrodesis status: Secondary | ICD-10-CM

## 2022-08-17 DIAGNOSIS — I129 Hypertensive chronic kidney disease with stage 1 through stage 4 chronic kidney disease, or unspecified chronic kidney disease: Secondary | ICD-10-CM

## 2022-08-17 DIAGNOSIS — M4726 Other spondylosis with radiculopathy, lumbar region: Secondary | ICD-10-CM | POA: Diagnosis not present

## 2022-08-17 DIAGNOSIS — Z7902 Long term (current) use of antithrombotics/antiplatelets: Secondary | ICD-10-CM | POA: Diagnosis not present

## 2022-08-17 HISTORY — PX: LAMINECTOMY WITH POSTERIOR LATERAL ARTHRODESIS LEVEL 1: SHX6335

## 2022-08-17 SURGERY — LAMINECTOMY WITH POSTERIOR LATERAL ARTHRODESIS LEVEL 1
Anesthesia: General | Site: Back | Laterality: Right

## 2022-08-17 MED ORDER — LEVOTHYROXINE SODIUM 137 MCG PO TABS
137.0000 ug | ORAL_TABLET | Freq: Every day | ORAL | Status: DC
  Filled 2022-08-17: qty 1

## 2022-08-17 MED ORDER — OXYCODONE HCL 5 MG PO TABS
5.0000 mg | ORAL_TABLET | Freq: Once | ORAL | Status: AC | PRN
  Administered 2022-08-17: 5 mg via ORAL

## 2022-08-17 MED ORDER — PROPOFOL 10 MG/ML IV BOLUS
INTRAVENOUS | Status: DC | PRN
  Administered 2022-08-17: 100 mg via INTRAVENOUS

## 2022-08-17 MED ORDER — OXYCODONE HCL 5 MG PO TABS: 5.0000 mg | ORAL_TABLET | ORAL | 0 refills | Status: DC | PRN

## 2022-08-17 MED ORDER — THROMBIN 20000 UNITS EX SOLR
CUTANEOUS | Status: AC
  Filled 2022-08-17: qty 20000

## 2022-08-17 MED ORDER — BUPIVACAINE HCL (PF) 0.25 % IJ SOLN
INTRAMUSCULAR | Status: AC
  Filled 2022-08-17: qty 30

## 2022-08-17 MED ORDER — HYDROMORPHONE HCL 1 MG/ML IJ SOLN
INTRAMUSCULAR | Status: AC
  Filled 2022-08-17: qty 1

## 2022-08-17 MED ORDER — FENTANYL CITRATE (PF) 100 MCG/2ML IJ SOLN
INTRAMUSCULAR | Status: AC
  Filled 2022-08-17: qty 2

## 2022-08-17 MED ORDER — AMISULPRIDE (ANTIEMETIC) 5 MG/2ML IV SOLN
10.0000 mg | Freq: Once | INTRAVENOUS | Status: AC | PRN
  Administered 2022-08-17: 10 mg via INTRAVENOUS

## 2022-08-17 MED ORDER — LOSARTAN POTASSIUM 50 MG PO TABS
50.0000 mg | ORAL_TABLET | Freq: Every day | ORAL | Status: DC
  Administered 2022-08-17: 50 mg via ORAL
  Filled 2022-08-17: qty 1

## 2022-08-17 MED ORDER — ACETAMINOPHEN 10 MG/ML IV SOLN
1000.0000 mg | Freq: Once | INTRAVENOUS | Status: DC | PRN
  Administered 2022-08-17: 1000 mg via INTRAVENOUS

## 2022-08-17 MED ORDER — CHLORHEXIDINE GLUCONATE 0.12 % MT SOLN
15.0000 mL | Freq: Once | OROMUCOSAL | Status: DC
  Filled 2022-08-17: qty 15

## 2022-08-17 MED ORDER — SODIUM CHLORIDE 0.9 % IV SOLN: 250.0000 mL | INTRAVENOUS | Status: DC

## 2022-08-17 MED ORDER — SODIUM CHLORIDE 0.9 % IV SOLN: INTRAVENOUS | Status: DC

## 2022-08-17 MED ORDER — POTASSIUM CHLORIDE IN NACL 20-0.9 MEQ/L-% IV SOLN: INTRAVENOUS | Status: DC

## 2022-08-17 MED ORDER — METHOCARBAMOL 500 MG PO TABS: 500.0000 mg | ORAL_TABLET | Freq: Four times a day (QID) | ORAL | 1 refills | Status: DC | PRN

## 2022-08-17 MED ORDER — DEXAMETHASONE 4 MG PO TABS
4.0000 mg | ORAL_TABLET | Freq: Four times a day (QID) | ORAL | Status: DC
  Administered 2022-08-17: 4 mg via ORAL
  Filled 2022-08-17: qty 1

## 2022-08-17 MED ORDER — THROMBIN 5000 UNITS EX SOLR
CUTANEOUS | Status: AC
  Filled 2022-08-17: qty 5000

## 2022-08-17 MED ORDER — BUPIVACAINE HCL (PF) 0.25 % IJ SOLN
INTRAMUSCULAR | Status: DC | PRN
  Administered 2022-08-17: 10 mL

## 2022-08-17 MED ORDER — DEXAMETHASONE SODIUM PHOSPHATE 4 MG/ML IJ SOLN: 4.0000 mg | Freq: Four times a day (QID) | INTRAMUSCULAR | Status: DC

## 2022-08-17 MED ORDER — OXYCODONE HCL 5 MG PO TABS: 5.0000 mg | ORAL_TABLET | ORAL | Status: DC | PRN

## 2022-08-17 MED ORDER — THROMBIN 5000 UNITS EX SOLR
CUTANEOUS | Status: DC | PRN
  Administered 2022-08-17: 5000 [IU] via TOPICAL

## 2022-08-17 MED ORDER — MENTHOL 3 MG MT LOZG: 1.0000 | LOZENGE | OROMUCOSAL | Status: DC | PRN

## 2022-08-17 MED ORDER — ONDANSETRON HCL 4 MG/2ML IJ SOLN
INTRAMUSCULAR | Status: AC
  Filled 2022-08-17: qty 2

## 2022-08-17 MED ORDER — ONDANSETRON HCL 4 MG PO TABS: 4.0000 mg | ORAL_TABLET | Freq: Four times a day (QID) | ORAL | Status: DC | PRN

## 2022-08-17 MED ORDER — OXYCODONE HCL 5 MG PO TABS
ORAL_TABLET | ORAL | Status: AC
  Filled 2022-08-17: qty 1

## 2022-08-17 MED ORDER — THROMBIN 20000 UNITS EX SOLR
CUTANEOUS | Status: DC | PRN
  Administered 2022-08-17: 20000 [IU] via TOPICAL

## 2022-08-17 MED ORDER — ACETAMINOPHEN 10 MG/ML IV SOLN
INTRAVENOUS | Status: AC
  Filled 2022-08-17: qty 100

## 2022-08-17 MED ORDER — SUGAMMADEX SODIUM 200 MG/2ML IV SOLN
INTRAVENOUS | Status: DC | PRN
  Administered 2022-08-17: 200 mg via INTRAVENOUS

## 2022-08-17 MED ORDER — CHLORHEXIDINE GLUCONATE CLOTH 2 % EX PADS: 6.0000 | MEDICATED_PAD | Freq: Once | CUTANEOUS | Status: DC

## 2022-08-17 MED ORDER — METHOCARBAMOL 500 MG PO TABS
500.0000 mg | ORAL_TABLET | Freq: Four times a day (QID) | ORAL | Status: DC | PRN
  Administered 2022-08-17: 500 mg via ORAL
  Filled 2022-08-17: qty 1

## 2022-08-17 MED ORDER — VANCOMYCIN HCL IN DEXTROSE 1-5 GM/200ML-% IV SOLN
1000.0000 mg | INTRAVENOUS | Status: AC
  Administered 2022-08-17: 1000 mg via INTRAVENOUS
  Filled 2022-08-17: qty 200

## 2022-08-17 MED ORDER — FENTANYL CITRATE (PF) 100 MCG/2ML IJ SOLN
25.0000 ug | INTRAMUSCULAR | Status: DC | PRN
  Administered 2022-08-17 (×3): 50 ug via INTRAVENOUS

## 2022-08-17 MED ORDER — MOMETASONE FURO-FORMOTEROL FUM 200-5 MCG/ACT IN AERO
2.0000 | INHALATION_SPRAY | Freq: Two times a day (BID) | RESPIRATORY_TRACT | Status: DC
  Filled 2022-08-17: qty 8.8

## 2022-08-17 MED ORDER — SENNA 8.6 MG PO TABS
1.0000 | ORAL_TABLET | Freq: Two times a day (BID) | ORAL | Status: DC
  Administered 2022-08-17: 8.6 mg via ORAL
  Filled 2022-08-17: qty 1

## 2022-08-17 MED ORDER — HYDROMORPHONE HCL 1 MG/ML IJ SOLN
0.2500 mg | INTRAMUSCULAR | Status: DC | PRN
  Administered 2022-08-17 (×4): 0.5 mg via INTRAVENOUS

## 2022-08-17 MED ORDER — OXYCODONE HCL 5 MG/5ML PO SOLN: 5.0000 mg | Freq: Once | ORAL | Status: AC | PRN

## 2022-08-17 MED ORDER — METHOCARBAMOL 1000 MG/10ML IJ SOLN: 500.0000 mg | Freq: Four times a day (QID) | INTRAVENOUS | Status: DC | PRN

## 2022-08-17 MED ORDER — LIDOCAINE 2% (20 MG/ML) 5 ML SYRINGE
INTRAMUSCULAR | Status: AC
  Filled 2022-08-17: qty 5

## 2022-08-17 MED ORDER — DEXAMETHASONE SODIUM PHOSPHATE 10 MG/ML IJ SOLN
INTRAMUSCULAR | Status: AC
  Filled 2022-08-17: qty 1

## 2022-08-17 MED ORDER — ONDANSETRON HCL 4 MG/2ML IJ SOLN: 4.0000 mg | Freq: Four times a day (QID) | INTRAMUSCULAR | Status: DC | PRN

## 2022-08-17 MED ORDER — MAGNESIUM OXIDE -MG SUPPLEMENT 400 (240 MG) MG PO TABS: 400.0000 mg | ORAL_TABLET | Freq: Every day | ORAL | Status: DC

## 2022-08-17 MED ORDER — PHENOL 1.4 % MT LIQD: 1.0000 | OROMUCOSAL | Status: DC | PRN

## 2022-08-17 MED ORDER — ROCURONIUM BROMIDE 10 MG/ML (PF) SYRINGE
PREFILLED_SYRINGE | INTRAVENOUS | Status: DC | PRN
  Administered 2022-08-17: 10 mg via INTRAVENOUS
  Administered 2022-08-17: 70 mg via INTRAVENOUS

## 2022-08-17 MED ORDER — ACETAMINOPHEN 160 MG/5ML PO SOLN: 1000.0000 mg | Freq: Once | ORAL | Status: DC | PRN

## 2022-08-17 MED ORDER — ALBUTEROL SULFATE HFA 108 (90 BASE) MCG/ACT IN AERS: 1.0000 | INHALATION_SPRAY | RESPIRATORY_TRACT | Status: DC | PRN

## 2022-08-17 MED ORDER — SODIUM CHLORIDE 0.9% FLUSH: 3.0000 mL | Freq: Two times a day (BID) | INTRAVENOUS | Status: DC

## 2022-08-17 MED ORDER — SODIUM CHLORIDE 0.9% FLUSH: 3.0000 mL | INTRAVENOUS | Status: DC | PRN

## 2022-08-17 MED ORDER — ORAL CARE MOUTH RINSE: 15.0000 mL | Freq: Once | OROMUCOSAL | Status: DC

## 2022-08-17 MED ORDER — CELECOXIB 200 MG PO CAPS
200.0000 mg | ORAL_CAPSULE | Freq: Two times a day (BID) | ORAL | Status: DC
  Administered 2022-08-17: 200 mg via ORAL
  Filled 2022-08-17: qty 1

## 2022-08-17 MED ORDER — AMISULPRIDE (ANTIEMETIC) 5 MG/2ML IV SOLN
INTRAVENOUS | Status: AC
  Filled 2022-08-17: qty 4

## 2022-08-17 MED ORDER — PROPOFOL 10 MG/ML IV BOLUS
INTRAVENOUS | Status: AC
  Filled 2022-08-17: qty 20

## 2022-08-17 MED ORDER — ACETAMINOPHEN 325 MG PO TABS: 650.0000 mg | ORAL_TABLET | ORAL | Status: DC | PRN

## 2022-08-17 MED ORDER — ACETAMINOPHEN 650 MG RE SUPP: 650.0000 mg | RECTAL | Status: DC | PRN

## 2022-08-17 MED ORDER — ROCURONIUM BROMIDE 10 MG/ML (PF) SYRINGE
PREFILLED_SYRINGE | INTRAVENOUS | Status: AC
  Filled 2022-08-17: qty 10

## 2022-08-17 MED ORDER — HYDROCHLOROTHIAZIDE 12.5 MG PO TABS
12.5000 mg | ORAL_TABLET | Freq: Every day | ORAL | Status: DC
  Administered 2022-08-17: 12.5 mg via ORAL
  Filled 2022-08-17: qty 1

## 2022-08-17 MED ORDER — FENTANYL CITRATE (PF) 250 MCG/5ML IJ SOLN
INTRAMUSCULAR | Status: DC | PRN
  Administered 2022-08-17: 50 ug via INTRAVENOUS
  Administered 2022-08-17: 100 ug via INTRAVENOUS
  Administered 2022-08-17 (×2): 50 ug via INTRAVENOUS

## 2022-08-17 MED ORDER — DEXAMETHASONE SODIUM PHOSPHATE 10 MG/ML IJ SOLN
INTRAMUSCULAR | Status: DC | PRN
  Administered 2022-08-17: 10 mg via INTRAVENOUS

## 2022-08-17 MED ORDER — IPRATROPIUM-ALBUTEROL 0.5-2.5 (3) MG/3ML IN SOLN: 3.0000 mL | RESPIRATORY_TRACT | Status: DC | PRN

## 2022-08-17 MED ORDER — LIDOCAINE 2% (20 MG/ML) 5 ML SYRINGE
INTRAMUSCULAR | Status: DC | PRN
  Administered 2022-08-17: 80 mg via INTRAVENOUS

## 2022-08-17 MED ORDER — ONDANSETRON HCL 4 MG/2ML IJ SOLN
INTRAMUSCULAR | Status: DC | PRN
  Administered 2022-08-17: 4 mg via INTRAVENOUS

## 2022-08-17 MED ORDER — FENTANYL CITRATE (PF) 250 MCG/5ML IJ SOLN
INTRAMUSCULAR | Status: AC
  Filled 2022-08-17: qty 5

## 2022-08-17 MED ORDER — ACETAMINOPHEN 500 MG PO TABS: 1000.0000 mg | ORAL_TABLET | Freq: Once | ORAL | Status: DC | PRN

## 2022-08-17 MED ORDER — LOSARTAN POTASSIUM-HCTZ 50-12.5 MG PO TABS: 1.0000 | ORAL_TABLET | Freq: Every day | ORAL | Status: DC

## 2022-08-17 MED ORDER — PANTOPRAZOLE SODIUM 40 MG PO TBEC: 40.0000 mg | DELAYED_RELEASE_TABLET | Freq: Every day | ORAL | Status: DC

## 2022-08-17 SURGICAL SUPPLY — 65 items
ADH SKN CLS APL DERMABOND .7 (GAUZE/BANDAGES/DRESSINGS) ×1
APL SKNCLS STERI-STRIP NONHPOA (GAUZE/BANDAGES/DRESSINGS) ×1
BAG COUNTER SPONGE SURGICOUNT (BAG) ×1 IMPLANT
BAG SPNG CNTER NS LX DISP (BAG) ×1
BASKET BONE COLLECTION (BASKET) IMPLANT
BENZOIN TINCTURE PRP APPL 2/3 (GAUZE/BANDAGES/DRESSINGS) ×1 IMPLANT
BLADE BONE MILL MEDIUM (MISCELLANEOUS) IMPLANT
BLADE CLIPPER SURG (BLADE) IMPLANT
BONE FIBERS PLIAFX 10 (Bone Implant) ×1 IMPLANT
BUR CARBIDE MATCH 3.0 (BURR) ×1 IMPLANT
CANISTER SUCT 3000ML PPV (MISCELLANEOUS) ×1 IMPLANT
CNTNR URN SCR LID CUP LEK RST (MISCELLANEOUS) ×1 IMPLANT
CONT SPEC 4OZ STRL OR WHT (MISCELLANEOUS) ×1
COVER BACK TABLE 60X90IN (DRAPES) ×1 IMPLANT
DERMABOND ADVANCED .7 DNX12 (GAUZE/BANDAGES/DRESSINGS) IMPLANT
DRAPE C-ARM 42X72 X-RAY (DRAPES) IMPLANT
DRAPE LAPAROTOMY 100X72X124 (DRAPES) ×1 IMPLANT
DRAPE SURG 17X23 STRL (DRAPES) ×1 IMPLANT
DRSG OPSITE 4X5.5 SM (GAUZE/BANDAGES/DRESSINGS) IMPLANT
DURAPREP 26ML APPLICATOR (WOUND CARE) ×1 IMPLANT
ELECT BLADE 4.0 EZ CLEAN MEGAD (MISCELLANEOUS) ×1
ELECT REM PT RETURN 9FT ADLT (ELECTROSURGICAL) ×1
ELECTRODE BLDE 4.0 EZ CLN MEGD (MISCELLANEOUS) IMPLANT
ELECTRODE REM PT RTRN 9FT ADLT (ELECTROSURGICAL) ×1 IMPLANT
EVACUATOR 1/8 PVC DRAIN (DRAIN) IMPLANT
GAUZE 4X4 16PLY ~~LOC~~+RFID DBL (SPONGE) IMPLANT
GLOVE BIO SURGEON STRL SZ7 (GLOVE) IMPLANT
GLOVE BIO SURGEON STRL SZ8 (GLOVE) ×2 IMPLANT
GLOVE BIOGEL PI IND STRL 7.0 (GLOVE) IMPLANT
GOWN STRL REUS W/ TWL LRG LVL3 (GOWN DISPOSABLE) IMPLANT
GOWN STRL REUS W/ TWL XL LVL3 (GOWN DISPOSABLE) ×2 IMPLANT
GOWN STRL REUS W/TWL 2XL LVL3 (GOWN DISPOSABLE) IMPLANT
GOWN STRL REUS W/TWL LRG LVL3 (GOWN DISPOSABLE)
GOWN STRL REUS W/TWL XL LVL3 (GOWN DISPOSABLE) ×2
GRAFT BNE FBR PLIAFX PRIME 10 (Bone Implant) IMPLANT
GRAFT BONE PROTEIOS LRG 5CC (Orthopedic Implant) IMPLANT
HEMOSTAT POWDER KIT SURGIFOAM (HEMOSTASIS) IMPLANT
KIT BASIN OR (CUSTOM PROCEDURE TRAY) ×1 IMPLANT
KIT TURNOVER KIT B (KITS) ×1 IMPLANT
MILL BONE PREP (MISCELLANEOUS) IMPLANT
NDL HYPO 25X1 1.5 SAFETY (NEEDLE) ×1 IMPLANT
NEEDLE HYPO 25X1 1.5 SAFETY (NEEDLE) ×1 IMPLANT
NS IRRIG 1000ML POUR BTL (IV SOLUTION) ×1 IMPLANT
PACK LAMINECTOMY NEURO (CUSTOM PROCEDURE TRAY) ×1 IMPLANT
PAD ARMBOARD 7.5X6 YLW CONV (MISCELLANEOUS) ×3 IMPLANT
PUTTY DBM 5CC (Putty) IMPLANT
ROD LORD LIPPED TI 5.5X35 (Rod) IMPLANT
ROD LORD LIPPED TI 5.5X40 (Rod) IMPLANT
SCREW CANC SHANK MOD 6.5X45 (Screw) IMPLANT
SCREW POLYAXIAL TULIP (Screw) IMPLANT
SET SCREW (Screw) ×4 IMPLANT
SET SCREW SPNE (Screw) IMPLANT
SOL ELECTROSURG ANTI STICK (MISCELLANEOUS) ×1
SOLUTION ELECTROSURG ANTI STCK (MISCELLANEOUS) ×1 IMPLANT
SPONGE SURGIFOAM ABS GEL 100 (HEMOSTASIS) ×1 IMPLANT
SPONGE T-LAP 4X18 ~~LOC~~+RFID (SPONGE) IMPLANT
STRIP CLOSURE SKIN 1/2X4 (GAUZE/BANDAGES/DRESSINGS) ×2 IMPLANT
SUT VIC AB 0 CT1 18XCR BRD8 (SUTURE) ×1 IMPLANT
SUT VIC AB 0 CT1 8-18 (SUTURE) ×1
SUT VIC AB 2-0 CP2 18 (SUTURE) ×1 IMPLANT
SUT VIC AB 3-0 SH 8-18 (SUTURE) ×2 IMPLANT
TOWEL GREEN STERILE (TOWEL DISPOSABLE) ×1 IMPLANT
TOWEL GREEN STERILE FF (TOWEL DISPOSABLE) ×1 IMPLANT
TRAY FOLEY MTR SLVR 16FR STAT (SET/KITS/TRAYS/PACK) IMPLANT
WATER STERILE IRR 1000ML POUR (IV SOLUTION) ×1 IMPLANT

## 2022-08-17 NOTE — Transfer of Care (Signed)
Immediate Anesthesia Transfer of Care Note  Patient: Antonio Bowen  Procedure(s) Performed: Right - L4-L5 hemifacetectomy, posterolateral fusion, non-segmental instrumentation L4-5 (Right: Back)  Patient Location: PACU  Anesthesia Type:General  Level of Consciousness: awake, alert , and drowsy  Airway & Oxygen Therapy: Patient Spontanous Breathing and Patient connected to face mask oxygen  Post-op Assessment: Report given to RN, Post -op Vital signs reviewed and stable, and Patient moving all extremities X 4  Post vital signs: Reviewed and stable  Last Vitals:  Vitals Value Taken Time  BP    Temp    Pulse 91 08/17/22 1241  Resp    SpO2 93 % 08/17/22 1241  Vitals shown include unvalidated device data.  Last Pain:  Vitals:   08/17/22 0915  TempSrc:   PainSc: 5          Complications: No notable events documented.

## 2022-08-17 NOTE — Evaluation (Signed)
Occupational Therapy Evaluation and DC Summary Patient Details Name: Antonio Bowen MRN: [id number] DOB: 01/16/1946 Today's Date: 08/17/2022   History of Present Illness Pt is 77 yo male presenting for planned L4-5 Fusion and decompression. Pt is currently post op. PMH: arthritis, colon polyps, COPD, Dyspnea, dysrhythmia, GERD, hiatal hernia, HTN, hypothyroidism, kidney stone, LBP, neuropathy RLE, O2 dependent 3L at night, CVA, thyroid disease   Clinical Impression   Pt admitted for above dx, PTA patient lived with spouse who can provide 24/7 support and was independent/ Mod I in most bADLs except lower body dressing d/t hx of hip replacement, ambulated no AD for short distances but standing walker for longer distances. Pt currently demonstrates good carryover of POB precautions, continued to further educate patient on strategies to complete bADLs while maintaining precautions. Pt does need assist for LBD but wife reports she can help, educated patient on the use of his reacher to assist with LBD and the use of his shower seat and long handeled sponge to complete lower body bathing independently if desired. Pt and wife demonstrated ability to don brace appropriately. Pt currently presenting close to baseline although mild balance deficits is apparent. Pt has no further skilled acute OT needs. No follow-up OT recommended at this time as patient seems close to baseline with dressing.      Recommendations for follow up therapy are one component of a multi-disciplinary discharge planning process, led by the attending physician.  Recommendations may be updated based on patient status, additional functional criteria and insurance authorization.   Assistance Recommended at Discharge Set up Supervision/Assistance  Patient can return home with the following A little help with bathing/dressing/bathroom;Assistance with cooking/housework;Assist for transportation    Functional Status Assessment  Patient has  not had a recent decline in their functional status  Equipment Recommendations  None recommended by OT    Recommendations for Other Services       Precautions / Restrictions Precautions Precautions: Back Precaution Booklet Issued: Yes (comment) (from PT) Precaution Comments: Pt demo'd precautions and ability to don brace Required Braces or Orthoses: Spinal Brace Spinal Brace: Lumbar corset Restrictions Weight Bearing Restrictions: No      Mobility Bed Mobility Overal bed mobility: Needs Assistance Bed Mobility: Supine to Sit     Supine to sit: Supervision     General bed mobility comments: Pt demo'd good carryover of log rolling from previous PT session    Transfers Overall transfer level: Independent   Transfers: Sit to/from Stand Sit to Stand: Supervision                  Balance Overall balance assessment: Modified Independent, Mild deficits observed, not formally tested                                         ADL either performed or assessed with clinical judgement   ADL Overall ADL's : Needs assistance/impaired Eating/Feeding: Independent   Grooming: Standing;Supervision/safety   Upper Body Bathing: Sitting;Independent   Lower Body Bathing: Sitting/lateral leans;Minimal assistance   Upper Body Dressing : Standing;Supervision/safety Upper Body Dressing Details (indicate cue type and reason): Pt donned shirt while sitting EOB and gown in jacket like fashion while standing Lower Body Dressing: Min guard;Sit to/from stand Lower Body Dressing Details (indicate cue type and reason): Max A to doff bilat socks, donned slip on shoes while standing min guard, advised patient to complete  this task when sitting. Pt mod A to don underwear but was able to progress to don pants with Min guard assist using reacher. Toilet Transfer: Supervision/safety;Ambulation   Toileting- Clothing Manipulation and Hygiene: Supervision/safety;Sitting/lateral  lean   Tub/ Shower Transfer: Supervision/safety   Functional mobility during ADLs: Supervision/safety       Vision         Perception     Praxis      Pertinent Vitals/Pain Pain Assessment Pain Assessment: 0-10 Pain Score: 4  Pain Location: LBP Pain Descriptors / Indicators: Aching Pain Intervention(s): Limited activity within patient's tolerance, Monitored during session     Hand Dominance Right   Extremity/Trunk Assessment Upper Extremity Assessment Upper Extremity Assessment: Overall WFL for tasks assessed   Lower Extremity Assessment Lower Extremity Assessment: Overall WFL for tasks assessed   Cervical / Trunk Assessment Cervical / Trunk Assessment: Back Surgery   Communication Communication Communication: No difficulties   Cognition Arousal/Alertness: Awake/alert Behavior During Therapy: WFL for tasks assessed/performed Overall Cognitive Status: Within Functional Limits for tasks assessed                                       General Comments  VSS on RA, Pt with continuous wheezing at baseline    Exercises     Shoulder Instructions      Home Living Family/patient expects to be discharged to:: Private residence Living Arrangements: Spouse/significant other Available Help at Discharge: Family;Available 24 hours/day Type of Home: House Home Access: Level entry     Home Layout: One level     Bathroom Shower/Tub: Producer, television/film/video: Handicapped height     Home Equipment: Rollator (4 wheels);Other (comment);Rolling Walker (2 wheels);Cane - single point;Shower seat;Grab bars - tub/shower;Grab bars - toilet;Adaptive equipment (stand up walker) Adaptive Equipment: Reacher Additional Comments: Pt wife reports she is comfortable with helping him complete lower body dressing      Prior Functioning/Environment Prior Level of Function : Independent/Modified Independent;Driving             Mobility Comments: used  stand up walker intermittently when back pain was bothering him ADLs Comments: mod I to independent, but wife assists with putting on socks when he does wear them        OT Problem List:        OT Treatment/Interventions:      OT Goals(Current goals can be found in the care plan section) Acute Rehab OT Goals Patient Stated Goal: to go home OT Goal Formulation: With patient Time For Goal Achievement: 08/31/22 Potential to Achieve Goals: Good  OT Frequency:      Co-evaluation              AM-PAC OT "6 Clicks" Daily Activity     Outcome Measure Help from another person eating meals?: None Help from another person taking care of personal grooming?: A Little Help from another person toileting, which includes using toliet, bedpan, or urinal?: None Help from another person bathing (including washing, rinsing, drying)?: A Little Help from another person to put on and taking off regular upper body clothing?: None Help from another person to put on and taking off regular lower body clothing?: A Lot 6 Click Score: 20   End of Session Equipment Utilized During Treatment: Gait belt Nurse Communication: Mobility status  Activity Tolerance: Patient tolerated treatment well Patient left: in bed;with family/visitor present  OT  Visit Diagnosis: Pain Pain - part of body:  (back)                Time: 3086-5784 OT Time Calculation (min): 21 min Charges:  OT General Charges $OT Visit: 1 Visit OT Evaluation $OT Eval Moderate Complexity: 1 Mod  08/17/2022  AB, OTR/L  Acute Rehabilitation Services  Office: [phone]   Tristan Schroeder 08/17/2022, 5:43 PM

## 2022-08-17 NOTE — H&P (Signed)
Subjective: Patient is a 77 y.o. male admitted for R leg pain. Onset of symptoms was several months ago, gradually worsening since that time.  The pain is rated severe, and is located at the across the lower back and radiates to R leg. The pain is described as aching and occurs all day. The symptoms have been progressive. Symptoms are exacerbated by exercise and standing. MRI or CT showed spondylolisthesis with stenosis L4-5   Past Medical History:  Diagnosis Date   Allergic rhinitis    Arthritis    BOOP (bronchiolitis obliterans with organizing pneumonia)    Bronchitis    Colon polyps    COPD (chronic obstructive pulmonary disease)    Dyspnea    with exertion    Dysrhythmia    skipped beats- followed by Dr Clifton James    GERD (gastroesophageal reflux disease)    History of hiatal hernia    Hypertension    Hypothyroidism    Kidney stone    Low back pain    Neuropathy 2024   right leg   Oxygen dependent    uses 3L/[place] at nite occasional during day if needed    PONV (postoperative nausea and vomiting)    Stroke    hx of 2 strokes which patient did not know he had had . Weakness in left arm   Thyroid disease    Tobacco dependence     Past Surgical History:  Procedure Laterality Date   BALLOON DILATION N/A 01/30/2022   Procedure: BALLOON DILATION;  Surgeon: Hilarie Fredrickson, MD;  Location: [facility];  Service: Gastroenterology;  Laterality: N/A;   BIOPSY  08/18/2019   Procedure: BIOPSY;  Surgeon: Hilarie Fredrickson, MD;  Location: [facility];  Service: Endoscopy;;   broncoscopy      CARDIAC CATHETERIZATION  07/03/2000   carpel tunnal     left   CATARACT EXTRACTION Right    COLONOSCOPY     x 4   DUPUYTREN CONTRACTURE RELEASE     Left   ESOPHAGOGASTRODUODENOSCOPY (EGD) WITH PROPOFOL N/A 03/26/2016   Procedure: ESOPHAGOGASTRODUODENOSCOPY (EGD) WITH PROPOFOL;  Surgeon: Hilarie Fredrickson, MD;  Location: [facility];  Service: Endoscopy;  Laterality: N/A;   ESOPHAGOGASTRODUODENOSCOPY  (EGD) WITH PROPOFOL N/A 08/18/2019   Procedure: ESOPHAGOGASTRODUODENOSCOPY (EGD) WITH PROPOFOL;  Surgeon: Hilarie Fredrickson, MD;  Location: [facility];  Service: Endoscopy;  Laterality: N/A;  Possible dilation   ESOPHAGOGASTRODUODENOSCOPY (EGD) WITH PROPOFOL N/A 01/30/2022   Procedure: ESOPHAGOGASTRODUODENOSCOPY (EGD) WITH PROPOFOL;  Surgeon: Hilarie Fredrickson, MD;  Location: [facility];  Service: Gastroenterology;  Laterality: N/A;   ESOPHAGOGASTRODUODENOSCOPY ENDOSCOPY     x 3   FACIAL FRACTURE SURGERY     1970s   LOOP RECORDER INSERTION N/A 11/05/2019   Procedure: LOOP RECORDER INSERTION;  Surgeon: Hillis Range, MD;  Location: [facility];  Service: Cardiovascular;  Laterality: N/A;   OTHER SURGICAL HISTORY     facial surgery   SAVORY DILATION N/A 08/18/2019   Procedure: SAVORY DILATION;  Surgeon: Hilarie Fredrickson, MD;  Location: [facility];  Service: Endoscopy;  Laterality: N/A;   TOTAL HIP ARTHROPLASTY  01/2011   Right hip, [place]   ULNAR NERVE TRANSPOSITION     left    Prior to Admission medications   Medication Sig Start Date End Date Taking? Authorizing Provider  acetaminophen (TYLENOL) 500 MG tablet Take 1,000 mg by mouth 2 (two) times daily as needed (for pain.).   Yes [provider]  albuterol (VENTOLIN HFA) 108 (90 Base)  MCG/ACT inhaler Inhale 1-2 puffs into the lungs every 4 (four) hours as needed for wheezing or shortness of breath. INHALE TWO PUFFS INTO LUNGS EVERY 6 HOURS AS NEEDED FOR WHEEZING 05/19/20  Yes [name], Elige Radon, MD  budesonide-formoterol (SYMBICORT) 160-4.5 MCG/ACT inhaler Inhale 2 puffs into the lungs 2 (two) times daily. in the morning and at bedtime. 08/14/21  Yes [name], Elige Radon, MD  cetirizine (ZYRTEC) 10 MG tablet Take 10 mg by mouth at bedtime.    Yes [provider]  clopidogrel (PLAVIX) 75 MG tablet TAKE 1 TABLET EVERY DAY 01/09/22  Yes [name], Elige Radon, MD  fluticasone (FLONASE) 50 MCG/ACT nasal spray USE 1 SPRAY IN EACH  NOSTRIL TWICE DAILY AS NEEDED FOR ALLERGIES OR RHINITIS Patient taking differently: Place 2 sprays into both nostrils 2 (two) times daily. 01/09/22  Yes [name], Elige Radon, MD  Homeopathic Products (LEG CRAMP RELIEF PO) Take 2 tablets by mouth at bedtime. [place] leg cramp   Yes [provider]  ipratropium-albuterol (DUONEB) 0.5-2.5 (3) MG/3ML SOLN Take 3 mLs by nebulization every 4 (four) hours as needed. 07/31/22  Yes Martina Sinner, MD  levothyroxine (SYNTHROID) 137 MCG tablet Take 1 tablet (137 mcg total) by mouth daily before breakfast. 02/15/22  Yes [name], Elige Radon, MD  losartan-hydrochlorothiazide (HYZAAR) 50-12.5 MG tablet Take 1 tablet by mouth daily. 02/15/22  Yes [name], Elige Radon, MD  magnesium oxide (MAG-OX) 400 MG tablet Take 400 mg by mouth at bedtime.    Yes [provider]  meloxicam (MOBIC) 15 MG tablet Take 1 tablet (15 mg total) by mouth daily. 02/15/22  Yes [name], Elige Radon, MD  omeprazole (PRILOSEC) 40 MG capsule Take 1 capsule (40 mg total) by mouth in the morning and at bedtime. 02/15/22  Yes [name], Elige Radon, MD  OXYGEN Inhale 3 L into the lungs at bedtime.   Yes [provider]  SUPER B COMPLEX/C PO Take 1 capsule by mouth at bedtime.   Yes [provider]  traMADol (ULTRAM) 50 MG tablet Take 100 mg by mouth 3 (three) times daily as needed (pain.). 06/06/21  Yes [provider]  traZODone (DESYREL) 50 MG tablet Take 0.5-1 tablets (25-50 mg total) by mouth at bedtime as needed for sleep. Patient taking differently: Take 50 mg by mouth at bedtime. 02/15/22  Yes [name], Elige Radon, MD   Allergies  Allergen Reactions   Nuts Nausea And Vomiting    sweating   Other Nausea And Vomiting    Per patient, cannot take any narcotic. Nausea and vomiting sweating   Vicodin [Hydrocodone-Acetaminophen]     tachycardia   Codeine Nausea And Vomiting   Aspirin Other (See Comments)    Upset stomach, bleeding   Penicillins Hives  and Rash    Has patient had a PCN reaction causing immediate rash, facial/tongue/throat swelling, SOB or lightheadedness with hypotension:No Has patient had a PCN reaction causing severe rash involving mucus membranes or skin necrosis:No Has patient had a PCN reaction that required hospitalization:No Has patient had a PCN reaction occurring within the last 10 years:Yes If all of the above answers are "NO", then may proceed with Cephalosporin use.     Social History   Tobacco Use   Smoking status: Former    Packs/day: 1.00    Years: 50.00    Additional pack years: 0.00    Total pack years: 50.00    Types: Cigarettes    Quit date: 07/30/2010    Years since quitting: 12.0  Smokeless tobacco: Current    Types: Snuff   Tobacco comments:    10-12 snuff/day (pouches)  Substance Use Topics   Alcohol use: Yes    Comment: rare    Family History  Problem Relation Age of Onset   Arthritis Father    Stroke Father 57   Prostate cancer Maternal Grandfather    Stomach cancer Maternal Grandmother    Cancer Paternal Grandmother    Colon cancer Other        mat 1st cousin   Cancer Mother    Healthy Daughter    Healthy Son    Pancreatic cancer Neg Hx    Esophageal cancer Neg Hx      Review of Systems  Positive ROS: neg  All other systems have been reviewed and were otherwise negative with the exception of those mentioned in the HPI and as above.  Objective: Vital signs in last 24 hours: Temp:  [97.6 F (36.4 C)] 97.6 F (36.4 C) (04/19 0845) Pulse Rate:  [81] 81 (04/19 0845) Resp:  [20] 20 (04/19 0845) BP: (147)/(80) 147/80 (04/19 0845) SpO2:  [95 %] 95 % (04/19 0845) Weight:  [91.6 kg] 91.6 kg (04/19 0845)  General Appearance: Alert, cooperative, no distress, appears stated age Head: Normocephalic, without obvious abnormality, atraumatic Eyes: PERRL, conjunctiva/corneas clear, EOM's intact    Neck: Supple, symmetrical, trachea midline Back: Symmetric, no curvature, ROM  normal, no CVA tenderness Lungs:  respirations unlabored Heart: Regular rate and rhythm Abdomen: Soft, non-tender Extremities: Extremities normal, atraumatic, no cyanosis or edema Pulses: 2+ and symmetric all extremities Skin: Skin color, texture, turgor normal, no rashes or lesions  NEUROLOGIC:   Mental status: Alert and oriented x4,  no aphasia, good attention span, fund of knowledge, and memory Motor Exam - grossly normal Sensory Exam - grossly normal Reflexes: 1+ Coordination - grossly normal Gait - grossly normal Balance - grossly normal Cranial Nerves: I: smell Not tested  II: visual acuity  OS: nl    OD: nl  II: visual fields Full to confrontation  II: pupils Equal, round, reactive to light  III,VII: ptosis None  III,IV,VI: extraocular muscles  Full ROM  V: mastication Normal  V: facial light touch sensation  Normal  V,VII: corneal reflex  Present  VII: facial muscle function - upper  Normal  VII: facial muscle function - lower Normal  VIII: hearing Not tested  IX: soft palate elevation  Normal  IX,X: gag reflex Present  XI: trapezius strength  5/5  XI: sternocleidomastoid strength 5/5  XI: neck flexion strength  5/5  XII: tongue strength  Normal    Data Review Lab Results  Component Value Date   WBC 9.5 08/01/2022   HGB 15.9 08/01/2022   HCT 48.0 08/01/2022   MCV 91 08/01/2022   PLT 200 08/01/2022   Lab Results  Component Value Date   NA 141 08/01/2022   K 4.3 08/01/2022   CL 104 08/01/2022   CO2 20 08/01/2022   BUN 21 08/01/2022   CREATININE 2.21 (H) 08/01/2022   GLUCOSE 89 08/01/2022   Lab Results  Component Value Date   INR 1.0 08/15/2022    Assessment/Plan:  Estimated body mass index is 29.83 kg/m as calculated from the following:   Height as of this encounter: 5\' 9"  (1.753 m).   Weight as of this encounter: 91.6 kg. Patient admitted for R L4-5 decompression and instrumented fusion. Patient has failed a reasonable attempt at conservative  therapy.  I explained the condition  and procedure to the patient and answered any questions.  Patient wishes to proceed with procedure as planned. Understands risks/ benefits and typical outcomes of procedure.   Tia Alert 08/17/2022 9:37 AM

## 2022-08-17 NOTE — Plan of Care (Signed)
Pt doing well. Pt and wife given D/C instructions with verbal understanding. Rx's were sent to the pharmacy by MD. Pt's incision is clean and dry with no sign of infection. Pt's IV was removed prior to D/C. Pt D/C'd home via wheelchair per MD order. Pt is stable @ D/C and has no other needs at this time. Ramonica Grigg, RN  

## 2022-08-17 NOTE — Op Note (Signed)
08/17/2022  12:30 PM  PATIENT:  Antonio Bowen  77 y.o. male  PRE-OPERATIVE DIAGNOSIS: Degenerative spondylolisthesis L4-5, facet arthropathy L4-5, right lateral recess and foraminal stenosis L4-5, right L5 radiculopathy.  POST-OPERATIVE DIAGNOSIS:  same  PROCEDURE:   1. Decompressive lumbar laminectomy, hemi facetectomy and foraminotomy L4-5 right  2. Posterior fixation L4-5 ATEC using L4-5 bilaterally cortical pedicle screws.  3. Intertransverse arthrodesis L4-5 bilaterally using morcellized autograft and allograft.  SURGEON:  Marikay Alar, MD  ASSISTANTS: Verlin Dike, FNP  ANESTHESIA:  General  EBL: 150 ml  Total I/O In: 500 [I.V.:500] Out: 150 [Blood:150]  BLOOD ADMINISTERED:none  DRAINS: none   INDICATION FOR PROCEDURE: This patient presented with right leg pain in an L5 distribution. Imaging revealed a degenerative spondylolisthesis at L4-5 with facet arthropathy and right lateral recess and foraminal stenosis. The patient tried a reasonable attempt at conservative medical measures without relief. I recommended decompression and instrumented fusion to address the stenosis as well as the segmental  instability.  Patient understood the risks, benefits, and alternatives and potential outcomes and wished to proceed.  PROCEDURE DETAILS:  The patient was brought to the operating room. After induction of generalized endotracheal anesthesia the patient was rolled into the prone position on chest rolls and all pressure points were padded. The patient's lumbar region was cleaned and then prepped with DuraPrep and draped in the usual sterile fashion. Anesthesia was injected and then a dorsal midline incision was made and carried down to the lumbosacral fascia. The fascia was opened and the paraspinous musculature was taken down in a subperiosteal fashion to expose L4-5. A self-retaining retractor was placed. Intraoperative fluoroscopy confirmed my level, and I started with placement  of the cortical pedicle screws at L4 bilaterally and L5 on the left. The pedicle screw entry zones were identified utilizing surface landmarks and  AP and lateral fluoroscopy. I scored the cortex with the high-speed drill and then used the hand drill to drill an upward and outward direction into the pedicle. I then tapped line to line. I then placed a 6.5 x 45 mm pedicle screws cortical pedicle screw into the pedicles of L4 bilaterally and at L5 on the left bilaterally.    I then turned my attention to the decompression and complete lumbar laminectomies, hemi- facetectomies, and foraminotomies were performed at L4-5 on the right.  My nurse practitioner was directly involved in the decompression and exposure of the neural elements. A generous decompression and generous foraminotomy was undertaken in order to adequately decompress the neural elements and address the patient's leg pain. The yellow ligament was removed to expose the underlying dura and nerve roots, and a generous foraminotomy were performed to adequately decompress the neural elements. Both the exiting and traversing nerve roots were decompressed  until a coronary dilator passed easily along the nerve roots.   We then turned our attention to the placement of the lower pedicle screw at L5 on the right.  the pedicle screw entry zones were identified utilizing surface landmarks and fluoroscopy. I drilled into each pedicle utilizing the hand drill, and tapped each pedicle with the appropriate tap. We palpated with a ball probe to assure no break in the cortex. We then placed 0.5 x 45 mm pedicle screw into the pedicles bilaterally at L5 on the right.  My nurse practitioner once again assisted in placement of the pedicle screw.  We then decorticated the transverse processes and laid a mixture of morcellized autograft and allograft out over these to perform  intertransverse arthrodesis at 4 5 bilaterally. We then placed lordotic rods into the multiaxial  screw heads of the pedicle screws and locked these in position with the locking caps and anti-torque device. We then checked our construct with AP and lateral fluoroscopy. Irrigated with copious amounts of bacitracin-containing saline solution. Inspected the nerve roots once again to assure adequate decompression, lined to the dura with Gelfoam,  and then we closed the muscle and the fascia with 0 Vicryl. Closed the subcutaneous tissues with 2-0 Vicryl and subcuticular tissues with 3-0 Vicryl. The skin was closed with benzoin and Steri-Strips. Dressing was then applied, the patient was awakened from general anesthesia and transported to the recovery room in stable condition. At the end of the procedure all sponge, needle and instrument counts were correct.   PLAN OF CARE: admit to inpatient  PATIENT DISPOSITION:  PACU - hemodynamically stable.   Delay start of Pharmacological VTE agent (>24hrs) due to surgical blood loss or risk of bleeding:  yes

## 2022-08-17 NOTE — Evaluation (Signed)
Physical Therapy Evaluation Patient Details Name: Antonio Bowen MRN: [id number] DOB: Jul 17, 1945 Today's Date: 08/17/2022  History of Present Illness  Pt is 77 yo male presenting for planned L4-5 Fusion and decompression. Pt is currently post op. PMH: arthritis, colon polyps, COPD, Dyspnea, dysrhythmia, GERD, hiatal hernia, HTN, hypothyroidism, kidney stone, LBP, neuropathy RLE, O2 dependent 3L at night, CVA, thyroid disease  Clinical Impression  Pt presents slightly below baseline. Currently supervision for all functional activities for safety and to maintain LB precautions. Pt and spouse provided with education and booklet for precautions following spinal surgery and stated understanding. Educated on how to step into walk in shower with stand up walker and how to transfer into car safely. Pt at this time is cleared from a physical therapy perspective to return home with family per home set up, available assistance and pt current functional status once medically stable. Pt will follow up with MD concerning any further skilled physical therapy services outside of the acute care hospital setting. If pt remains in hospital will continue to follow go continue to improve mobility and strength.        Recommendations for follow up therapy are one component of a multi-disciplinary discharge planning process, led by the attending physician.  Recommendations may be updated based on patient status, additional functional criteria and insurance authorization.  Follow Up Recommendations       Assistance Recommended at Discharge Set up Supervision/Assistance  Patient can return home with the following  A little help with walking and/or transfers;Assist for transportation;Assistance with cooking/housework;Help with stairs or ramp for entrance    Equipment Recommendations None recommended by PT  Recommendations for Other Services       Functional Status Assessment Patient has had a recent decline in their  functional status and demonstrates the ability to make significant improvements in function in a reasonable and predictable amount of time.     Precautions / Restrictions Precautions Precautions: Back Precaution Booklet Issued: Yes (comment) Precaution Comments: went over precautions, donning brace and log roll Required Braces or Orthoses: Spinal Brace Spinal Brace: Lumbar corset Restrictions Weight Bearing Restrictions: No      Mobility  Bed Mobility Overal bed mobility: Needs Assistance Bed Mobility: Supine to Sit, Sit to Supine     Supine to sit: Supervision Sit to supine: Supervision   General bed mobility comments: Supervision with verbal cues for sequencing step by step with log roll technique. Pt and spouse stated understanding. Patient Response: Cooperative  Transfers Overall transfer level: Needs assistance Equipment used: Rolling walker (2 wheels) Transfers: Sit to/from Stand Sit to Stand: Supervision           General transfer comment: Supervision for sit to stand for correct hand placement to prevent pulling up from RW. Pt and spouse stated understanding.    Ambulation/Gait Ambulation/Gait assistance: Supervision Gait Distance (Feet): 200 Feet Assistive device: Rolling walker (2 wheels) Gait Pattern/deviations: Step-through pattern, Decreased stride length Gait velocity: Slightly decreased Gait velocity interpretation: 1.31 - 2.62 ft/sec, indicative of limited community ambulator   General Gait Details: No significant gait deviations with RW. pt has decreased stride length  Stairs Stairs:  (not applicable pt does not have stairs.)             Balance Overall balance assessment: Modified Independent, Mild deficits observed, not formally tested         Pertinent Vitals/Pain Pain Assessment Pain Assessment: 0-10 Pain Score: 5  Pain Location: LBP Pain Descriptors / Indicators: Aching Pain Intervention(s):  Monitored during session    Home  Living Family/patient expects to be discharged to:: Private residence Living Arrangements: Spouse/significant other Available Help at Discharge: Family;Available 24 hours/day Type of Home: House Home Access: Level entry       Home Layout: One level Home Equipment: Rollator (4 wheels);Other (comment);Rolling Walker (2 wheels);Cane - single point;Shower seat (stand up walker)      Prior Function Prior Level of Function : Independent/Modified Independent;Driving             Mobility Comments: used stand up walker intermittently when back pain was bothering him ADLs Comments: mod I to independent.     Hand Dominance   Dominant Hand: Right    Extremity/Trunk Assessment   Upper Extremity Assessment Upper Extremity Assessment: Overall WFL for tasks assessed    Lower Extremity Assessment Lower Extremity Assessment: Overall WFL for tasks assessed    Cervical / Trunk Assessment Cervical / Trunk Assessment: Back Surgery  Communication   Communication: No difficulties  Cognition Arousal/Alertness: Awake/alert Behavior During Therapy: WFL for tasks assessed/performed Overall Cognitive Status: Within Functional Limits for tasks assessed            General Comments General comments (skin integrity, edema, etc.): Spouse present throughout session and supportive. states that she has helped him when he had hip surgery and is able to help with ADL's as needed to maintain back precautions. Discussed the importance of mobility getting up to stand and just walk room to room every hour while awake and 2 slightly longer walks per day.        Assessment/Plan    PT Assessment Patient needs continued PT services  PT Problem List Decreased mobility;Pain       PT Treatment Interventions DME instruction;Therapeutic activities;Gait training;Therapeutic exercise;Patient/family education;Balance training;Functional mobility training;Neuromuscular re-education    PT Goals (Current goals  can be found in the Care Plan section)  Acute Rehab PT Goals Patient Stated Goal: To return home PT Goal Formulation: With patient Time For Goal Achievement: 08/31/22 Potential to Achieve Goals: Good    Frequency Min 5X/week        AM-PAC PT "6 Clicks" Mobility  Outcome Measure Help needed turning from your back to your side while in a flat bed without using bedrails?: A Little Help needed moving from lying on your back to sitting on the side of a flat bed without using bedrails?: A Little Help needed moving to and from a bed to a chair (including a wheelchair)?: A Little Help needed standing up from a chair using your arms (e.g., wheelchair or bedside chair)?: A Little Help needed to walk in hospital room?: A Little Help needed climbing 3-5 steps with a railing? : A Little 6 Click Score: 18    End of Session Equipment Utilized During Treatment: Back brace Activity Tolerance: Patient tolerated treatment well Patient left: in bed;with call bell/phone within reach;with family/visitor present Nurse Communication: Mobility status PT Visit Diagnosis: Pain;Other abnormalities of gait and mobility (R26.89) Pain - Right/Left:  (LBP)    Time: 1610-9604 PT Time Calculation (min) (ACUTE ONLY): 26 min   Charges:   PT Evaluation $PT Eval Low Complexity: 1 Low PT Treatments $Therapeutic Activity: 8-22 mins        Harrel Carina, DPT, CLT  Acute Rehabilitation Services Office: [phone] (Secure chat preferred)   Claudia Desanctis 08/17/2022, 4:57 PM

## 2022-08-17 NOTE — Progress Notes (Signed)
Pharmacy Antibiotic Note  Antonio Bowen is a 77 y.o. male admitted on 08/17/2022 for decompressive laminectomy. Pharmacy has been consulted for vancomycin dosing for surgical ppx. No drain placed during procedure. Scr 2.21. Based on estimated AUC, next dose due in 48hr.   Plan: Due to decreased renal function, previous vancomycin  IV x1 dose given 4/19 @ 0930 sufficient for surgical prophylaxis, no further doses needed.   Thank you for allowing pharmacy to be a part of this patient's care.  Carrington Clamp 08/17/2022 2:35 PM

## 2022-08-17 NOTE — Discharge Summary (Signed)
Physician Discharge Summary  Patient ID: Antonio Bowen MRN: [id number] DOB/AGE: [birth year] 77 y.o.  Admit date: 08/17/2022 Discharge date: 08/17/2022  Admission Diagnoses: spondylolisthesis/ stenosis L4-5 with right leg pain    Discharge Diagnoses: same   Discharged Condition: good  Hospital Course: The patient was admitted on 08/17/2022 and taken to the operating room where the patient underwent decompression and fusion L4-5. The patient tolerated the procedure well and was taken to the recovery room and then to the floor in stable condition. The hospital course was routine. There were no complications. The wound remained clean dry and intact. Pt had appropriate back soreness. No complaints of leg pain or new N/T/W. The patient remained afebrile with stable vital signs, and tolerated a regular diet. The patient continued to increase activities, and pain was well controlled with oral pain medications.   Consults: None  Significant Diagnostic Studies:  Results for orders placed or performed during the hospital encounter of 08/15/22  Surgical pcr screen   Specimen: Nasal Mucosa; Nasal Swab  Result Value Ref Range   MRSA, PCR NEGATIVE NEGATIVE   Staphylococcus aureus POSITIVE (A) NEGATIVE  Protime-INR  Result Value Ref Range   Prothrombin Time 12.8 11.4 - 15.2 seconds   INR 1.0 0.8 - 1.2  Type and screen [facility]  Result Value Ref Range   ABO/RH(D) A POS    Antibody Screen NEG    Sample Expiration 08/29/2022,2359    Extend sample reason      NO TRANSFUSIONS OR PREGNANCY IN THE PAST 3 MONTHS Performed at [facility], [address], [place], [place] [address]     CUP PACEART REMOTE DEVICE CHECK  Result Date: 07/31/2022 ILR summary report received. Battery status OK. Normal device function. No new symptom, tachy, brady, or pause episodes. No new AF episodes. Monthly summary reports and ROV/PRN LA, CVRS   Antibiotics:  Anti-infectives (From  admission, onward)    Start     Dose/Rate Route Frequency Ordered Stop   08/17/22 0900  vancomycin (VANCOCIN) IVPB 1000 mg/200 mL premix        1,000 mg 200 mL/hr over 60 Minutes Intravenous On call to O.R. 08/17/22 0847 08/17/22 1021       Discharge Exam: Blood pressure (!) 145/101, pulse 93, temperature 97.7 F (36.5 C), temperature source Oral, resp. rate 18, height  (1.753 m), weight 91.6 kg, SpO2 95 %. Neurologic: Grossly normal Dressing dry  Discharge Medications:   Allergies as of 08/17/2022       Reactions   Nuts Nausea And Vomiting   sweating   Other Nausea And Vomiting   Per patient, cannot take any narcotic. Nausea and vomiting sweating   Vicodin [hydrocodone-acetaminophen]    tachycardia   Codeine Nausea And Vomiting   Aspirin Other (See Comments)   Upset stomach, bleeding   Penicillins Hives, Rash   Has patient had a PCN reaction causing immediate rash, facial/tongue/throat swelling, SOB or lightheadedness with hypotension:No Has patient had a PCN reaction causing severe rash involving mucus membranes or skin necrosis:No Has patient had a PCN reaction that required hospitalization:No Has patient had a PCN reaction occurring within the last 10 years:Yes If all of the above answers are "NO", then may proceed with Cephalosporin use.        Medication List     TAKE these medications    acetaminophen 500 MG tablet Commonly known as: TYLENOL Take 1,000 mg by mouth 2 (two) times daily as needed (for  pain.).   albuterol 108 (90 Base) MCG/ACT inhaler Commonly known as: Ventolin HFA Inhale 1-2 puffs into the lungs every 4 (four) hours as needed for wheezing or shortness of breath. INHALE TWO PUFFS INTO LUNGS EVERY 6 HOURS AS NEEDED FOR WHEEZING   budesonide-formoterol 160-4.5 MCG/ACT inhaler Commonly known as: Symbicort Inhale 2 puffs into the lungs 2 (two) times daily. in the morning and at bedtime.   cetirizine 10 MG tablet Commonly known as:  ZYRTEC Take 10 mg by mouth at bedtime.   clopidogrel 75 MG tablet Commonly known as: PLAVIX TAKE 1 TABLET EVERY DAY   fluticasone 50 MCG/ACT nasal spray Commonly known as: FLONASE USE 1 SPRAY IN EACH NOSTRIL TWICE DAILY AS NEEDED FOR ALLERGIES OR RHINITIS What changed: See the new instructions.   ipratropium-albuterol 0.5-2.5 (3) MG/3ML Soln Commonly known as: DUONEB Take 3 mLs by nebulization every 4 (four) hours as needed.   LEG CRAMP RELIEF PO Take 2 tablets by mouth at bedtime. Hyland leg cramp   levothyroxine 137 MCG tablet Commonly known as: SYNTHROID Take 1 tablet (137 mcg total) by mouth daily before breakfast.   losartan-hydrochlorothiazide 50-12.5 MG tablet Commonly known as: HYZAAR Take 1 tablet by mouth daily.   magnesium oxide 400 MG tablet Commonly known as: MAG-OX Take 400 mg by mouth at bedtime.   meloxicam 15 MG tablet Commonly known as: MOBIC Take 1 tablet (15 mg total) by mouth daily.   methocarbamol 500 MG tablet Commonly known as: ROBAXIN Take 1 tablet (500 mg total) by mouth every 6 (six) hours as needed for muscle spasms.   omeprazole 40 MG capsule Commonly known as: PRILOSEC Take 1 capsule (40 mg total) by mouth in the morning and at bedtime.   oxyCODONE 5 MG immediate release tablet Commonly known as: Oxy IR/ROXICODONE Take 1 tablet (5 mg total) by mouth every 4 (four) hours as needed for moderate pain ((score 4 to 6)).   OXYGEN Inhale 3 L into the lungs at bedtime.   SUPER B COMPLEX/C PO Take 1 capsule by mouth at bedtime.   traMADol 50 MG tablet Commonly known as: ULTRAM Take 100 mg by mouth 3 (three) times daily as needed (pain.).   traZODone 50 MG tablet Commonly known as: DESYREL Take 0.5-1 tablets (25-50 mg total) by mouth at bedtime as needed for sleep. What changed:  how much to take when to take this               Durable Medical Equipment  (From admission, onward)           Start     Ordered   08/17/22  1400  DME Walker rolling  Once       Question:  Patient needs a walker to treat with the following condition  Answer:  S/P lumbar fusion   08/17/22 1359   08/17/22 1400  DME 3 n 1  Once        08/17/22 1359            Disposition: home   Final Dx: laminectomy and instrumented fusion L4-5  Discharge Instructions      Remove dressing in 72 hours   Complete by: As directed    Call MD for:  difficulty breathing, headache or visual disturbances   Complete by: As directed    Call MD for:  persistant nausea and vomiting   Complete by: As directed    Call MD for:  redness, tenderness, or signs of infection (pain, swelling,  redness, odor or green/yellow discharge around incision site)   Complete by: As directed    Call MD for:  severe uncontrolled pain   Complete by: As directed    Call MD for:  temperature >100.4   Complete by: As directed    Diet - low sodium heart healthy   Complete by: As directed    Increase activity slowly   Complete by: As directed         Follow-up Information     Tia Alert, MD. Schedule an appointment as soon as possible for a visit in 2 week(s).   Specialty: Neurosurgery Contact information: [address] [place] [address] [phone]                  Signed: Tia Alert 08/17/2022, 4:33 PM

## 2022-08-17 NOTE — Anesthesia Procedure Notes (Signed)
Procedure Name: Intubation Date/Time: 08/17/2022 10:29 AM  Performed by: Aundria Rud, CRNAPre-anesthesia Checklist: Patient identified, Emergency Drugs available, Suction available and Patient being monitored Patient Re-evaluated:Patient Re-evaluated prior to induction Oxygen Delivery Method: Circle System Utilized Preoxygenation: Pre-oxygenation with 100% oxygen Induction Type: IV induction Ventilation: Two handed mask ventilation required Laryngoscope Size: 2 and Miller Grade View: Grade I Tube type: Oral Tube size: 7.5 mm Number of attempts: 1 Airway Equipment and Method: Stylet and Oral airway Placement Confirmation: ETT inserted through vocal cords under direct vision, positive ETCO2 and breath sounds checked- equal and bilateral Secured at: 21 cm Tube secured with: Tape Dental Injury: Teeth and Oropharynx as per pre-operative assessment

## 2022-08-20 NOTE — Anesthesia Postprocedure Evaluation (Signed)
Anesthesia Post Note  Patient: Antonio Bowen  Procedure(s) Performed: Right - L4-L5 hemifacetectomy, posterolateral fusion, non-segmental instrumentation L4-5 (Right: Back)     Patient location during evaluation: PACU Anesthesia Type: General Level of consciousness: awake and patient cooperative Pain management: pain level controlled Vital Signs Assessment: post-procedure vital signs reviewed and stable Respiratory status: spontaneous breathing, nonlabored ventilation and respiratory function stable Cardiovascular status: blood pressure returned to baseline and stable Postop Assessment: no apparent nausea or vomiting Anesthetic complications: no   No notable events documented.  Last Vitals:  Vitals:   08/17/22 1412 08/17/22 1557  BP: (!) 153/93 (!) 145/101  Pulse: 91 93  Resp: 18   Temp:  36.5 C  SpO2: 95%     Last Pain:  Vitals:   08/17/22 1600  TempSrc:   PainSc: 5                  [name]

## 2022-08-21 ENCOUNTER — Encounter (HOSPITAL_COMMUNITY): Payer: Self-pay | Admitting: Neurological Surgery

## 2022-08-30 LAB — CUP PACEART REMOTE DEVICE CHECK
Date Time Interrogation Session: 20240501231328
Implantable Pulse Generator Implant Date: 20210708

## 2022-09-03 ENCOUNTER — Ambulatory Visit (INDEPENDENT_AMBULATORY_CARE_PROVIDER_SITE_OTHER): Payer: Medicare HMO

## 2022-09-03 DIAGNOSIS — I639 Cerebral infarction, unspecified: Secondary | ICD-10-CM | POA: Diagnosis not present

## 2022-09-03 DIAGNOSIS — Z8673 Personal history of transient ischemic attack (TIA), and cerebral infarction without residual deficits: Secondary | ICD-10-CM

## 2022-09-05 NOTE — Progress Notes (Signed)
Carelink Summary Report / Loop Recorder 

## 2022-09-10 ENCOUNTER — Ambulatory Visit: Payer: Medicare HMO

## 2022-09-27 DIAGNOSIS — M5416 Radiculopathy, lumbar region: Secondary | ICD-10-CM | POA: Diagnosis not present

## 2022-09-27 DIAGNOSIS — M4316 Spondylolisthesis, lumbar region: Secondary | ICD-10-CM | POA: Diagnosis not present

## 2022-10-01 ENCOUNTER — Other Ambulatory Visit: Payer: Self-pay | Admitting: Family Medicine

## 2022-10-01 DIAGNOSIS — J441 Chronic obstructive pulmonary disease with (acute) exacerbation: Secondary | ICD-10-CM

## 2022-10-01 NOTE — Telephone Encounter (Signed)
[name] NTBS last OV 02/15/22 RF not sent to mail order pharmacy

## 2022-10-02 MED ORDER — BUDESONIDE-FORMOTEROL FUMARATE 160-4.5 MCG/ACT IN AERO
2.0000 | INHALATION_SPRAY | Freq: Two times a day (BID) | RESPIRATORY_TRACT | 3 refills | Status: DC
Start: 2022-10-02 — End: 2022-12-10

## 2022-10-02 NOTE — Progress Notes (Signed)
Carelink Summary Report / Loop Recorder 

## 2022-10-02 NOTE — Telephone Encounter (Signed)
Pt made appt July 22

## 2022-10-02 NOTE — Addendum Note (Signed)
Addended by: Hessie Diener on: 10/02/2022 01:58 PM   Modules accepted: Orders

## 2022-10-03 LAB — CUP PACEART REMOTE DEVICE CHECK
Date Time Interrogation Session: 20240601230643
Implantable Pulse Generator Implant Date: 20210708

## 2022-10-08 ENCOUNTER — Encounter: Payer: Self-pay | Admitting: *Deleted

## 2022-10-08 ENCOUNTER — Ambulatory Visit (INDEPENDENT_AMBULATORY_CARE_PROVIDER_SITE_OTHER): Payer: Medicare HMO

## 2022-10-08 DIAGNOSIS — I639 Cerebral infarction, unspecified: Secondary | ICD-10-CM

## 2022-10-11 DIAGNOSIS — Z5181 Encounter for therapeutic drug level monitoring: Secondary | ICD-10-CM | POA: Diagnosis not present

## 2022-10-11 DIAGNOSIS — M5136 Other intervertebral disc degeneration, lumbar region: Secondary | ICD-10-CM | POA: Diagnosis not present

## 2022-10-11 DIAGNOSIS — Z79899 Other long term (current) drug therapy: Secondary | ICD-10-CM | POA: Diagnosis not present

## 2022-10-11 DIAGNOSIS — G90529 Complex regional pain syndrome I of unspecified lower limb: Secondary | ICD-10-CM | POA: Diagnosis not present

## 2022-10-11 DIAGNOSIS — M545 Low back pain, unspecified: Secondary | ICD-10-CM | POA: Diagnosis not present

## 2022-10-11 DIAGNOSIS — M5416 Radiculopathy, lumbar region: Secondary | ICD-10-CM | POA: Diagnosis not present

## 2022-10-15 ENCOUNTER — Ambulatory Visit: Payer: Medicare HMO

## 2022-10-30 ENCOUNTER — Ambulatory Visit: Payer: Medicare HMO

## 2022-10-30 DIAGNOSIS — Z8673 Personal history of transient ischemic attack (TIA), and cerebral infarction without residual deficits: Secondary | ICD-10-CM

## 2022-10-30 NOTE — Progress Notes (Signed)
Carelink Summary Report / Loop Recorder 

## 2022-10-31 LAB — CUP PACEART REMOTE DEVICE CHECK
Date Time Interrogation Session: 20240702231453
Implantable Pulse Generator Implant Date: 20210708

## 2022-11-12 ENCOUNTER — Ambulatory Visit: Payer: Medicare HMO

## 2022-11-19 ENCOUNTER — Ambulatory Visit: Payer: Medicare HMO | Admitting: Family Medicine

## 2022-11-23 NOTE — Progress Notes (Signed)
Carelink Summary Report / Loop Recorder 

## 2022-11-26 ENCOUNTER — Ambulatory Visit: Payer: Medicare HMO | Admitting: Family Medicine

## 2022-11-26 DIAGNOSIS — M4316 Spondylolisthesis, lumbar region: Secondary | ICD-10-CM | POA: Diagnosis not present

## 2022-12-03 ENCOUNTER — Ambulatory Visit (INDEPENDENT_AMBULATORY_CARE_PROVIDER_SITE_OTHER): Payer: Medicare HMO

## 2022-12-03 DIAGNOSIS — I639 Cerebral infarction, unspecified: Secondary | ICD-10-CM | POA: Diagnosis not present

## 2022-12-03 DIAGNOSIS — Z48811 Encounter for surgical aftercare following surgery on the nervous system: Secondary | ICD-10-CM | POA: Diagnosis not present

## 2022-12-03 DIAGNOSIS — M545 Low back pain, unspecified: Secondary | ICD-10-CM | POA: Diagnosis not present

## 2022-12-03 DIAGNOSIS — M6281 Muscle weakness (generalized): Secondary | ICD-10-CM | POA: Diagnosis not present

## 2022-12-04 ENCOUNTER — Telehealth: Payer: Self-pay

## 2022-12-04 NOTE — Telephone Encounter (Signed)
Pt called in wanting to stop ILR transmissions he doesn't think that it is worth it. Pt would like a call back to confirm this is ok from AM. Please send a msg back to [place] so we can take him out the system once AM says its ok

## 2022-12-05 DIAGNOSIS — Z48811 Encounter for surgical aftercare following surgery on the nervous system: Secondary | ICD-10-CM | POA: Diagnosis not present

## 2022-12-05 DIAGNOSIS — M6281 Muscle weakness (generalized): Secondary | ICD-10-CM | POA: Diagnosis not present

## 2022-12-05 DIAGNOSIS — M5440 Lumbago with sciatica, unspecified side: Secondary | ICD-10-CM | POA: Diagnosis not present

## 2022-12-05 DIAGNOSIS — M545 Low back pain, unspecified: Secondary | ICD-10-CM | POA: Diagnosis not present

## 2022-12-10 ENCOUNTER — Encounter: Payer: Self-pay | Admitting: Family Medicine

## 2022-12-10 ENCOUNTER — Ambulatory Visit (INDEPENDENT_AMBULATORY_CARE_PROVIDER_SITE_OTHER): Payer: Medicare HMO | Admitting: Family Medicine

## 2022-12-10 VITALS — BP 126/76 | HR 78 | Temp 97.4°F | Ht 69.0 in | Wt 197.0 lb

## 2022-12-10 DIAGNOSIS — M170 Bilateral primary osteoarthritis of knee: Secondary | ICD-10-CM | POA: Diagnosis not present

## 2022-12-10 DIAGNOSIS — N1832 Chronic kidney disease, stage 3b: Secondary | ICD-10-CM

## 2022-12-10 DIAGNOSIS — E785 Hyperlipidemia, unspecified: Secondary | ICD-10-CM

## 2022-12-10 DIAGNOSIS — I6389 Other cerebral infarction: Secondary | ICD-10-CM | POA: Diagnosis not present

## 2022-12-10 DIAGNOSIS — J441 Chronic obstructive pulmonary disease with (acute) exacerbation: Secondary | ICD-10-CM

## 2022-12-10 DIAGNOSIS — I10 Essential (primary) hypertension: Secondary | ICD-10-CM | POA: Diagnosis not present

## 2022-12-10 DIAGNOSIS — E039 Hypothyroidism, unspecified: Secondary | ICD-10-CM

## 2022-12-10 MED ORDER — CETIRIZINE HCL 10 MG PO TABS: 10.0000 mg | ORAL_TABLET | Freq: Every day | ORAL | 3 refills | Status: DC

## 2022-12-10 MED ORDER — LOSARTAN POTASSIUM-HCTZ 50-12.5 MG PO TABS: 1.0000 | ORAL_TABLET | Freq: Every day | ORAL | 3 refills | Status: DC

## 2022-12-10 MED ORDER — OMEPRAZOLE 40 MG PO CPDR: 40.0000 mg | DELAYED_RELEASE_CAPSULE | Freq: Two times a day (BID) | ORAL | 3 refills | Status: DC

## 2022-12-10 MED ORDER — ALBUTEROL SULFATE HFA 108 (90 BASE) MCG/ACT IN AERS
1.0000 | INHALATION_SPRAY | RESPIRATORY_TRACT | 3 refills | Status: DC | PRN
Start: 2022-12-10 — End: 2023-12-12

## 2022-12-10 MED ORDER — MELOXICAM 15 MG PO TABS: 15.0000 mg | ORAL_TABLET | Freq: Every day | ORAL | 3 refills | Status: DC

## 2022-12-10 MED ORDER — METHYLPREDNISOLONE ACETATE 80 MG/ML IJ SUSP
80.0000 mg | Freq: Once | INTRAMUSCULAR | Status: DC
Start: 2022-12-10 — End: 2023-06-12

## 2022-12-10 MED ORDER — LEVOTHYROXINE SODIUM 137 MCG PO TABS: 137.0000 ug | ORAL_TABLET | Freq: Every day | ORAL | 3 refills | Status: DC

## 2022-12-10 MED ORDER — CLOPIDOGREL BISULFATE 75 MG PO TABS: 75.0000 mg | ORAL_TABLET | Freq: Every day | ORAL | 3 refills | Status: DC

## 2022-12-10 MED ORDER — IPRATROPIUM-ALBUTEROL 0.5-2.5 (3) MG/3ML IN SOLN: 3.0000 mL | RESPIRATORY_TRACT | 3 refills | Status: DC | PRN

## 2022-12-10 MED ORDER — BUDESONIDE-FORMOTEROL FUMARATE 160-4.5 MCG/ACT IN AERO: 2.0000 | INHALATION_SPRAY | Freq: Two times a day (BID) | RESPIRATORY_TRACT | 3 refills | Status: DC

## 2022-12-10 MED ORDER — TRAZODONE HCL 50 MG PO TABS: 25.0000 mg | ORAL_TABLET | Freq: Every evening | ORAL | 3 refills | Status: DC | PRN

## 2022-12-10 MED ORDER — FLUTICASONE PROPIONATE 50 MCG/ACT NA SUSP: 1.0000 | Freq: Two times a day (BID) | NASAL | 3 refills | Status: DC | PRN

## 2022-12-10 NOTE — Progress Notes (Signed)
BP 126/76   Pulse 78   Temp (!) 97.4 F (36.3 C) (Temporal)   Ht 5\' 9"  (1.753 m)   Wt 197 lb (89.4 kg)   SpO2 93%   BMI 29.09 kg/m    Subjective:   Patient ID: Antonio Bowen, male    DOB: [birth year], 77 y.o.   MRN: [id number]  HPI: Antonio Bowen is a 77 y.o. male presenting on 12/10/2022 for Hypertension, Hyperlipidemia, Hypothyroidism, and Medical Management of Chronic Issues   HPI Hypertension and CKD 3 recheck Patient is currently on losartan hydrochlorothiazide, and their blood pressure today is 126/76. Patient denies any lightheadedness or dizziness. Patient denies headaches, blurred vision, chest pains, shortness of breath, or weakness. Denies any side effects from medication and is content with current medication.   COPD Patient is coming in for COPD recheck today.  He is currently on albuterol and Breztri and when he cannot get Markus Daft he does Symbicort.  He has a mild chronic cough but denies any major coughing spells or wheezing spells.  He has 1 nighttime symptoms per week and 1 daytime symptoms per week currently.   Hyperlipidemia Patient is coming in for recheck of his hyperlipidemia. The patient is currently taking no medication currently. They deny any issues with myalgias or history of liver damage from it. They deny any focal numbness or weakness or chest pain.   Hypothyroidism recheck Patient is coming in for thyroid recheck today as well. They deny any issues with hair changes or heat or cold problems or diarrhea or constipation. They deny any chest pain or palpitations. They are currently on levothyroxine 137 micrograms   Knee pain bilateral Patient has been having knee pain bilateral, he has been diagnosed with osteoarthritis of both knees and wants to get injections for them.  His last injections were about 8 or 9 months ago.  He said the benefit from the last at least 3 months.  Relevant past medical, surgical, family and social history reviewed and  updated as indicated. Interim medical history since our last visit reviewed. Allergies and medications reviewed and updated.  Review of Systems  Constitutional:  Negative for chills and fever.  Eyes:  Negative for visual disturbance.  Respiratory:  Negative for shortness of breath and wheezing.   Cardiovascular:  Negative for chest pain and leg swelling.  Musculoskeletal:  Positive for arthralgias. Negative for back pain, gait problem, joint swelling and myalgias.  Skin:  Negative for rash.  All other systems reviewed and are negative.   Per HPI unless specifically indicated above   Allergies as of 12/10/2022       Reactions   Nuts Nausea And Vomiting   sweating   Other Nausea And Vomiting   Per patient, cannot take any narcotic. Nausea and vomiting sweating   Vicodin [hydrocodone-acetaminophen]    tachycardia   Codeine Nausea And Vomiting   Aspirin Other (See Comments)   Upset stomach, bleeding   Penicillins Hives, Rash   Has patient had a PCN reaction causing immediate rash, facial/tongue/throat swelling, SOB or lightheadedness with hypotension:No Has patient had a PCN reaction causing severe rash involving mucus membranes or skin necrosis:No Has patient had a PCN reaction that required hospitalization:No Has patient had a PCN reaction occurring within the last 10 years:Yes If all of the above answers are "NO", then may proceed with Cephalosporin use.        Medication List        Accurate as of December 10, 2022  4:35 PM. If you have any questions, ask your nurse or doctor.          STOP taking these medications    oxyCODONE 5 MG immediate release tablet Commonly known as: Oxy IR/ROXICODONE Stopped by: Elige Radon        TAKE these medications    acetaminophen 500 MG tablet Commonly known as: TYLENOL Take 1,000 mg by mouth 2 (two) times daily as needed (for pain.).   albuterol 108 (90 Base) MCG/ACT inhaler Commonly known as: Ventolin HFA Inhale  1-2 puffs into the lungs every 4 (four) hours as needed for wheezing or shortness of breath. INHALE TWO PUFFS INTO LUNGS EVERY 6 HOURS AS NEEDED FOR WHEEZING   budesonide-formoterol 160-4.5 MCG/ACT inhaler Commonly known as: Symbicort Inhale 2 puffs into the lungs 2 (two) times daily. in the morning and at bedtime.   cetirizine 10 MG tablet Commonly known as: ZYRTEC Take 1 tablet (10 mg total) by mouth at bedtime.   clopidogrel 75 MG tablet Commonly known as: PLAVIX Take 1 tablet (75 mg total) by mouth daily.   fluticasone 50 MCG/ACT nasal spray Commonly known as: FLONASE Place 1 spray into both nostrils 2 (two) times daily as needed for allergies or rhinitis. What changed: See the new instructions. Changed by: Elige Radon    ipratropium-albuterol 0.5-2.5 (3) MG/3ML Soln Commonly known as: DUONEB Take 3 mLs by nebulization every 4 (four) hours as needed.   LEG CRAMP RELIEF PO Take 2 tablets by mouth at bedtime. Hyland leg cramp   levothyroxine 137 MCG tablet Commonly known as: SYNTHROID Take 1 tablet (137 mcg total) by mouth daily before breakfast.   losartan-hydrochlorothiazide 50-12.5 MG tablet Commonly known as: HYZAAR Take 1 tablet by mouth daily.   magnesium oxide 400 MG tablet Commonly known as: MAG-OX Take 400 mg by mouth at bedtime.   meloxicam 15 MG tablet Commonly known as: MOBIC Take 1 tablet (15 mg total) by mouth daily.   methocarbamol 500 MG tablet Commonly known as: ROBAXIN Take 1 tablet (500 mg total) by mouth every 6 (six) hours as needed for muscle spasms.   omeprazole 40 MG capsule Commonly known as: PRILOSEC Take 1 capsule (40 mg total) by mouth in the morning and at bedtime.   OXYGEN Inhale 3 L into the lungs at bedtime.   SUPER B COMPLEX/C PO Take 1 capsule by mouth at bedtime.   traMADol 50 MG tablet Commonly known as: ULTRAM Take 100 mg by mouth 3 (three) times daily as needed (pain.).   traZODone 50 MG tablet Commonly known  as: DESYREL Take 0.5-1 tablets (25-50 mg total) by mouth at bedtime as needed for sleep. What changed:  how much to take when to take this reasons to take this         Objective:   BP 126/76   Pulse 78   Temp (!) 97.4 F (36.3 C) (Temporal)   Ht 5\' 9"  (1.753 m)   Wt 197 lb (89.4 kg)   SpO2 93%   BMI 29.09 kg/m   Wt Readings from Last 3 Encounters:  12/10/22 197 lb (89.4 kg)  08/17/22 202 lb (91.6 kg)  08/15/22 200 lb (90.7 kg)    Physical Exam Vitals and nursing note reviewed.  Constitutional:      General: He is not in acute distress.    Appearance: He is well-developed. He is not diaphoretic.  Eyes:     General: No scleral icterus.    Conjunctiva/sclera: Conjunctivae normal.  Neck:     Thyroid: No thyromegaly.  Cardiovascular:     Rate and Rhythm: Normal rate and regular rhythm.     Heart sounds: Normal heart sounds. No murmur heard. Pulmonary:     Effort: Pulmonary effort is normal. No respiratory distress.     Breath sounds: Normal breath sounds. No wheezing.  Musculoskeletal:        General: Tenderness present. Normal range of motion.     Cervical back: Neck supple.     Right knee: Tenderness present over the medial joint line.     Left knee: Tenderness present over the medial joint line.  Lymphadenopathy:     Cervical: No cervical adenopathy.  Skin:    General: Skin is warm and dry.     Findings: No rash.  Neurological:     Mental Status: He is alert and oriented to person, place, and time.     Coordination: Coordination normal.  Psychiatric:        Behavior: Behavior normal.     Knee injection: Consent form signed. Risk factors of bleeding and infection discussed with patient and patient is agreeable towards injection. Patient prepped with Betadine. Lateral approach towards injection used. Injected 80 mg of Depo-Medrol and 1 mL of 2% lidocaine. Patient tolerated procedure well and no side effects from noted. Minimal to no bleeding. Simple bandage  applied after.   Assessment & Plan:   Problem List Items Addressed This Visit       Cardiovascular and Mediastinum   Hypertension - Primary   Relevant Medications   losartan-hydrochlorothiazide (HYZAAR) 50-12.5 MG tablet   Other Relevant Orders   CBC with Differential/Platelet   CMP14+EGFR   Lipid panel     Respiratory   Obstructive chronic bronchitis with exacerbation Gold B Copd   Relevant Medications   albuterol (VENTOLIN HFA) 108 (90 Base) MCG/ACT inhaler   budesonide-formoterol (SYMBICORT) 160-4.5 MCG/ACT inhaler   cetirizine (ZYRTEC) 10 MG tablet   fluticasone (FLONASE) 50 MCG/ACT nasal spray   ipratropium-albuterol (DUONEB) 0.5-2.5 (3) MG/3ML SOLN   methylPREDNISolone acetate (DEPO-MEDROL) injection 80 mg (Start on 12/10/2022  4:45 PM)   methylPREDNISolone acetate (DEPO-MEDROL) injection 80 mg (Start on 12/10/2022  4:45 PM)     Endocrine   Hypothyroidism (Chronic)   Relevant Medications   levothyroxine (SYNTHROID) 137 MCG tablet   Other Relevant Orders   TSH     Genitourinary   Chronic kidney disease (CKD), stage III (moderate) (HCC)   Relevant Orders   CMP14+EGFR     Other   Hyperlipidemia LDL goal <130   Relevant Medications   losartan-hydrochlorothiazide (HYZAAR) 50-12.5 MG tablet   Other Relevant Orders   Lipid panel   Other Visit Diagnoses     Cerebrovascular accident (CVA) due to other mechanism (HCC)       Relevant Medications   clopidogrel (PLAVIX) 75 MG tablet   losartan-hydrochlorothiazide (HYZAAR) 50-12.5 MG tablet   Other Relevant Orders   CBC with Differential/Platelet   CMP14+EGFR   Lipid panel   Essential hypertension       Relevant Medications   losartan-hydrochlorothiazide (HYZAAR) 50-12.5 MG tablet   Primary osteoarthritis of both knees       Relevant Medications   meloxicam (MOBIC) 15 MG tablet   methylPREDNISolone acetate (DEPO-MEDROL) injection 80 mg (Start on 12/10/2022  4:45 PM)   methylPREDNISolone acetate (DEPO-MEDROL)  injection 80 mg (Start on 12/10/2022  4:45 PM)       Continue current medicine, no changes.  Did injections today  and seemed to do well with them.  Continue current inhalers, seems to be doing well today. Follow up plan: Return in about 6 months (around 06/12/2023), or if symptoms worsen or fail to improve, for Hypothyroidism and COPD recheck.  Counseling provided for all of the vaccine components Orders Placed This Encounter  Procedures   CBC with Differential/Platelet   CMP14+EGFR   Lipid panel   TSH    Arville Care, MD [facility] 12/10/2022, 4:35 PM

## 2022-12-11 DIAGNOSIS — Z48811 Encounter for surgical aftercare following surgery on the nervous system: Secondary | ICD-10-CM | POA: Diagnosis not present

## 2022-12-11 DIAGNOSIS — M545 Low back pain, unspecified: Secondary | ICD-10-CM | POA: Diagnosis not present

## 2022-12-11 DIAGNOSIS — M5440 Lumbago with sciatica, unspecified side: Secondary | ICD-10-CM | POA: Diagnosis not present

## 2022-12-11 DIAGNOSIS — M6281 Muscle weakness (generalized): Secondary | ICD-10-CM | POA: Diagnosis not present

## 2022-12-13 DIAGNOSIS — M6281 Muscle weakness (generalized): Secondary | ICD-10-CM | POA: Diagnosis not present

## 2022-12-13 DIAGNOSIS — M5440 Lumbago with sciatica, unspecified side: Secondary | ICD-10-CM | POA: Diagnosis not present

## 2022-12-13 DIAGNOSIS — M545 Low back pain, unspecified: Secondary | ICD-10-CM | POA: Diagnosis not present

## 2022-12-13 DIAGNOSIS — Z48811 Encounter for surgical aftercare following surgery on the nervous system: Secondary | ICD-10-CM | POA: Diagnosis not present

## 2022-12-14 NOTE — Progress Notes (Signed)
Carelink Summary Report / Loop Recorder 

## 2022-12-17 ENCOUNTER — Ambulatory Visit: Payer: Medicare HMO

## 2022-12-17 DIAGNOSIS — Z48811 Encounter for surgical aftercare following surgery on the nervous system: Secondary | ICD-10-CM | POA: Diagnosis not present

## 2022-12-17 DIAGNOSIS — M545 Low back pain, unspecified: Secondary | ICD-10-CM | POA: Diagnosis not present

## 2022-12-17 DIAGNOSIS — M5440 Lumbago with sciatica, unspecified side: Secondary | ICD-10-CM | POA: Diagnosis not present

## 2022-12-17 DIAGNOSIS — M6281 Muscle weakness (generalized): Secondary | ICD-10-CM | POA: Diagnosis not present

## 2022-12-19 DIAGNOSIS — M545 Low back pain, unspecified: Secondary | ICD-10-CM | POA: Diagnosis not present

## 2022-12-19 DIAGNOSIS — M5440 Lumbago with sciatica, unspecified side: Secondary | ICD-10-CM | POA: Diagnosis not present

## 2022-12-19 DIAGNOSIS — Z48811 Encounter for surgical aftercare following surgery on the nervous system: Secondary | ICD-10-CM | POA: Diagnosis not present

## 2022-12-19 DIAGNOSIS — M6281 Muscle weakness (generalized): Secondary | ICD-10-CM | POA: Diagnosis not present

## 2022-12-24 DIAGNOSIS — Z48811 Encounter for surgical aftercare following surgery on the nervous system: Secondary | ICD-10-CM | POA: Diagnosis not present

## 2022-12-24 DIAGNOSIS — M545 Low back pain, unspecified: Secondary | ICD-10-CM | POA: Diagnosis not present

## 2022-12-24 DIAGNOSIS — M6281 Muscle weakness (generalized): Secondary | ICD-10-CM | POA: Diagnosis not present

## 2022-12-24 DIAGNOSIS — M5440 Lumbago with sciatica, unspecified side: Secondary | ICD-10-CM | POA: Diagnosis not present

## 2022-12-27 DIAGNOSIS — M5440 Lumbago with sciatica, unspecified side: Secondary | ICD-10-CM | POA: Diagnosis not present

## 2022-12-27 DIAGNOSIS — M545 Low back pain, unspecified: Secondary | ICD-10-CM | POA: Diagnosis not present

## 2022-12-27 DIAGNOSIS — M6281 Muscle weakness (generalized): Secondary | ICD-10-CM | POA: Diagnosis not present

## 2022-12-27 DIAGNOSIS — Z48811 Encounter for surgical aftercare following surgery on the nervous system: Secondary | ICD-10-CM | POA: Diagnosis not present

## 2023-01-01 LAB — CUP PACEART REMOTE DEVICE CHECK
Date Time Interrogation Session: 20240902231348
Implantable Pulse Generator Implant Date: 20210708

## 2023-01-07 ENCOUNTER — Ambulatory Visit (INDEPENDENT_AMBULATORY_CARE_PROVIDER_SITE_OTHER): Payer: Medicare HMO

## 2023-01-07 DIAGNOSIS — M545 Low back pain, unspecified: Secondary | ICD-10-CM | POA: Diagnosis not present

## 2023-01-07 DIAGNOSIS — I639 Cerebral infarction, unspecified: Secondary | ICD-10-CM | POA: Diagnosis not present

## 2023-01-07 DIAGNOSIS — M5440 Lumbago with sciatica, unspecified side: Secondary | ICD-10-CM | POA: Diagnosis not present

## 2023-01-07 DIAGNOSIS — M6281 Muscle weakness (generalized): Secondary | ICD-10-CM | POA: Diagnosis not present

## 2023-01-07 DIAGNOSIS — Z48811 Encounter for surgical aftercare following surgery on the nervous system: Secondary | ICD-10-CM | POA: Diagnosis not present

## 2023-01-08 DIAGNOSIS — Z6830 Body mass index (BMI) 30.0-30.9, adult: Secondary | ICD-10-CM | POA: Diagnosis not present

## 2023-01-08 DIAGNOSIS — M5416 Radiculopathy, lumbar region: Secondary | ICD-10-CM | POA: Diagnosis not present

## 2023-01-10 ENCOUNTER — Other Ambulatory Visit: Payer: Self-pay | Admitting: Neurological Surgery

## 2023-01-10 DIAGNOSIS — M5440 Lumbago with sciatica, unspecified side: Secondary | ICD-10-CM | POA: Diagnosis not present

## 2023-01-10 DIAGNOSIS — M5416 Radiculopathy, lumbar region: Secondary | ICD-10-CM

## 2023-01-10 DIAGNOSIS — M6281 Muscle weakness (generalized): Secondary | ICD-10-CM | POA: Diagnosis not present

## 2023-01-10 DIAGNOSIS — Z48811 Encounter for surgical aftercare following surgery on the nervous system: Secondary | ICD-10-CM | POA: Diagnosis not present

## 2023-01-10 DIAGNOSIS — M545 Low back pain, unspecified: Secondary | ICD-10-CM | POA: Diagnosis not present

## 2023-01-14 DIAGNOSIS — M6281 Muscle weakness (generalized): Secondary | ICD-10-CM | POA: Diagnosis not present

## 2023-01-14 DIAGNOSIS — Z48811 Encounter for surgical aftercare following surgery on the nervous system: Secondary | ICD-10-CM | POA: Diagnosis not present

## 2023-01-14 DIAGNOSIS — M5440 Lumbago with sciatica, unspecified side: Secondary | ICD-10-CM | POA: Diagnosis not present

## 2023-01-14 DIAGNOSIS — M545 Low back pain, unspecified: Secondary | ICD-10-CM | POA: Diagnosis not present

## 2023-01-17 DIAGNOSIS — Z48811 Encounter for surgical aftercare following surgery on the nervous system: Secondary | ICD-10-CM | POA: Diagnosis not present

## 2023-01-17 DIAGNOSIS — M545 Low back pain, unspecified: Secondary | ICD-10-CM | POA: Diagnosis not present

## 2023-01-17 DIAGNOSIS — M6281 Muscle weakness (generalized): Secondary | ICD-10-CM | POA: Diagnosis not present

## 2023-01-21 ENCOUNTER — Ambulatory Visit: Payer: Medicare HMO

## 2023-01-21 DIAGNOSIS — M5440 Lumbago with sciatica, unspecified side: Secondary | ICD-10-CM | POA: Diagnosis not present

## 2023-01-21 DIAGNOSIS — M6281 Muscle weakness (generalized): Secondary | ICD-10-CM | POA: Diagnosis not present

## 2023-01-21 DIAGNOSIS — Z48811 Encounter for surgical aftercare following surgery on the nervous system: Secondary | ICD-10-CM | POA: Diagnosis not present

## 2023-01-21 DIAGNOSIS — M545 Low back pain, unspecified: Secondary | ICD-10-CM | POA: Diagnosis not present

## 2023-01-23 NOTE — Progress Notes (Signed)
Carelink Summary Report / Loop Recorder 

## 2023-01-24 DIAGNOSIS — M545 Low back pain, unspecified: Secondary | ICD-10-CM | POA: Diagnosis not present

## 2023-01-24 DIAGNOSIS — Z48811 Encounter for surgical aftercare following surgery on the nervous system: Secondary | ICD-10-CM | POA: Diagnosis not present

## 2023-01-24 DIAGNOSIS — M6281 Muscle weakness (generalized): Secondary | ICD-10-CM | POA: Diagnosis not present

## 2023-01-24 DIAGNOSIS — M5440 Lumbago with sciatica, unspecified side: Secondary | ICD-10-CM | POA: Diagnosis not present

## 2023-01-28 DIAGNOSIS — M6281 Muscle weakness (generalized): Secondary | ICD-10-CM | POA: Diagnosis not present

## 2023-01-28 DIAGNOSIS — M5440 Lumbago with sciatica, unspecified side: Secondary | ICD-10-CM | POA: Diagnosis not present

## 2023-01-28 DIAGNOSIS — M545 Low back pain, unspecified: Secondary | ICD-10-CM | POA: Diagnosis not present

## 2023-01-28 DIAGNOSIS — Z48811 Encounter for surgical aftercare following surgery on the nervous system: Secondary | ICD-10-CM | POA: Diagnosis not present

## 2023-01-29 NOTE — Discharge Instructions (Signed)

## 2023-01-30 ENCOUNTER — Ambulatory Visit
Admission: RE | Admit: 2023-01-30 | Discharge: 2023-01-30 | Disposition: A | Discharge status: Home / Self Care | Payer: Medicare HMO | Source: Ambulatory Visit | Attending: Neurological Surgery | Admitting: Neurological Surgery

## 2023-01-30 DIAGNOSIS — M47817 Spondylosis without myelopathy or radiculopathy, lumbosacral region: Secondary | ICD-10-CM | POA: Diagnosis not present

## 2023-01-30 DIAGNOSIS — M47816 Spondylosis without myelopathy or radiculopathy, lumbar region: Secondary | ICD-10-CM | POA: Diagnosis not present

## 2023-01-30 DIAGNOSIS — M5416 Radiculopathy, lumbar region: Secondary | ICD-10-CM

## 2023-01-30 DIAGNOSIS — T84226A Displacement of internal fixation device of vertebrae, initial encounter: Secondary | ICD-10-CM | POA: Diagnosis not present

## 2023-01-30 DIAGNOSIS — I700 Atherosclerosis of aorta: Secondary | ICD-10-CM | POA: Diagnosis not present

## 2023-01-30 MED ORDER — DIAZEPAM 5 MG PO TABS
5.0000 mg | ORAL_TABLET | Freq: Once | ORAL | Status: AC
  Administered 2023-01-30: 5 mg via ORAL

## 2023-01-30 MED ORDER — MEPERIDINE HCL 50 MG/ML IJ SOLN: 50.0000 mg | Freq: Once | INTRAMUSCULAR | Status: DC | PRN

## 2023-01-30 MED ORDER — ONDANSETRON HCL 4 MG/2ML IJ SOLN: 4.0000 mg | Freq: Once | INTRAMUSCULAR | Status: DC | PRN

## 2023-01-30 MED ORDER — IOPAMIDOL (ISOVUE-M 200) INJECTION 41%
20.0000 mL | Freq: Once | INTRAMUSCULAR | Status: AC
  Administered 2023-01-30: 20 mL via INTRATHECAL

## 2023-02-04 DIAGNOSIS — Z48811 Encounter for surgical aftercare following surgery on the nervous system: Secondary | ICD-10-CM | POA: Diagnosis not present

## 2023-02-04 DIAGNOSIS — M5440 Lumbago with sciatica, unspecified side: Secondary | ICD-10-CM | POA: Diagnosis not present

## 2023-02-04 DIAGNOSIS — M545 Low back pain, unspecified: Secondary | ICD-10-CM | POA: Diagnosis not present

## 2023-02-04 DIAGNOSIS — M6281 Muscle weakness (generalized): Secondary | ICD-10-CM | POA: Diagnosis not present

## 2023-02-07 DIAGNOSIS — R293 Abnormal posture: Secondary | ICD-10-CM | POA: Diagnosis not present

## 2023-02-07 DIAGNOSIS — Z48811 Encounter for surgical aftercare following surgery on the nervous system: Secondary | ICD-10-CM | POA: Diagnosis not present

## 2023-02-07 DIAGNOSIS — M6281 Muscle weakness (generalized): Secondary | ICD-10-CM | POA: Diagnosis not present

## 2023-02-07 DIAGNOSIS — M545 Low back pain, unspecified: Secondary | ICD-10-CM | POA: Diagnosis not present

## 2023-02-11 ENCOUNTER — Ambulatory Visit (INDEPENDENT_AMBULATORY_CARE_PROVIDER_SITE_OTHER): Payer: Medicare HMO

## 2023-02-11 DIAGNOSIS — I639 Cerebral infarction, unspecified: Secondary | ICD-10-CM

## 2023-02-12 LAB — CUP PACEART REMOTE DEVICE CHECK
Date Time Interrogation Session: 20241013231747
Implantable Pulse Generator Implant Date: 20210708

## 2023-02-14 DIAGNOSIS — Z6830 Body mass index (BMI) 30.0-30.9, adult: Secondary | ICD-10-CM | POA: Diagnosis not present

## 2023-02-14 DIAGNOSIS — M5416 Radiculopathy, lumbar region: Secondary | ICD-10-CM | POA: Diagnosis not present

## 2023-02-14 DIAGNOSIS — S32009K Unspecified fracture of unspecified lumbar vertebra, subsequent encounter for fracture with nonunion: Secondary | ICD-10-CM | POA: Diagnosis not present

## 2023-02-15 ENCOUNTER — Other Ambulatory Visit: Payer: Self-pay | Admitting: Neurological Surgery

## 2023-02-18 ENCOUNTER — Telehealth: Payer: Self-pay | Admitting: *Deleted

## 2023-02-18 NOTE — Telephone Encounter (Signed)
Pre-operative Risk Assessment    Patient Name: Antonio Bowen  DOB: May 18, 1945 MRN: [id number]      Request for Surgical Clearance    Procedure:   L4-5 LUMBAR FUSION / REVISION  Date of Surgery:  Clearance 05/06/23                                 Surgeon:  Tia Alert, MD Surgeon's Group or Practice Name:  [facility] Phone number:  [phone] Fax number:  [phone]   Type of Clearance Requested:   - Medical  - Pharmacy:  Hold Clopidogrel (Plavix) X'S 7 DAYS   Type of Anesthesia:  General    Additional requests/questions:    Wilhemina Cash   02/18/2023, 7:55 AM

## 2023-02-18 NOTE — Telephone Encounter (Signed)
Pt has been scheduled for tele pre op appt 04/15/23. Med rec and consent are done.     Patient Consent for Virtual Visit        Antonio Bowen has provided verbal consent on 02/18/2023 for a virtual visit (video or telephone).   CONSENT FOR VIRTUAL VISIT FOR:  Antonio Bowen  By participating in this virtual visit I agree to the following:  I hereby voluntarily request, consent and authorize [facility] and its employed or contracted physicians, physician assistants, nurse practitioners or other licensed health care professionals (the Practitioner), to provide me with telemedicine health care services (the "Services") as deemed necessary by the treating Practitioner. I acknowledge and consent to receive the Services by the Practitioner via telemedicine. I understand that the telemedicine visit will involve communicating with the Practitioner through live audiovisual communication technology and the disclosure of certain medical information by electronic transmission. I acknowledge that I have been given the opportunity to request an in-person assessment or other available alternative prior to the telemedicine visit and am voluntarily participating in the telemedicine visit.  I understand that I have the right to withhold or withdraw my consent to the use of telemedicine in the course of my care at any time, without affecting my right to future care or treatment, and that the Practitioner or I may terminate the telemedicine visit at any time. I understand that I have the right to inspect all information obtained and/or recorded in the course of the telemedicine visit and may receive copies of available information for a reasonable fee.  I understand that some of the potential risks of receiving the Services via telemedicine include:  Delay or interruption in medical evaluation due to technological equipment failure or disruption; Information transmitted may not be sufficient (e.g. poor  resolution of images) to allow for appropriate medical decision making by the Practitioner; and/or  In rare instances, security protocols could fail, causing a breach of personal health information.  Furthermore, I acknowledge that it is my responsibility to provide information about my medical history, conditions and care that is complete and accurate to the best of my ability. I acknowledge that Practitioner's advice, recommendations, and/or decision may be based on factors not within their control, such as incomplete or inaccurate data provided by me or distortions of diagnostic images or specimens that may result from electronic transmissions. I understand that the practice of medicine is not an exact science and that Practitioner makes no warranties or guarantees regarding treatment outcomes. I acknowledge that a copy of this consent can be made available to me via my patient portal [facility] MyChart), or I can request a printed copy by calling the office of [facility].    I understand that my insurance will be billed for this visit.   I have read or had this consent read to me. I understand the contents of this consent, which adequately explains the benefits and risks of the Services being provided via telemedicine.  I have been provided ample opportunity to ask questions regarding this consent and the Services and have had my questions answered to my satisfaction. I give my informed consent for the services to be provided through the use of telemedicine in my medical care

## 2023-02-18 NOTE — Telephone Encounter (Signed)
Name: Antonio Bowen  DOB: 30-Aug-1945  MRN: [id number]  Primary Cardiologist: Christell Constant, MD   Preoperative team, please contact this patient and set up a phone call appointment for further preoperative risk assessment. Please obtain consent and complete medication review. Thank you for your help.  I confirm that guidance regarding antiplatelet and oral anticoagulation therapy has been completed and, if necessary, noted below.  His Plavix is prescribed by his PCP.  Recommendations for holding Plavix will need to come from prescribing provider.  I also confirmed the patient resides in the state of [place]. As per [facility] Medical Board telemedicine laws, the patient must reside in the state in which the provider is licensed.   Ronney Asters, NP 02/18/2023, 11:27 AM [facility]

## 2023-02-18 NOTE — Telephone Encounter (Signed)
Pt has been scheduled for tele pre op appt 04/15/23. Med rec and consent are done.

## 2023-02-21 DIAGNOSIS — M5416 Radiculopathy, lumbar region: Secondary | ICD-10-CM | POA: Diagnosis not present

## 2023-02-21 DIAGNOSIS — M51362 Other intervertebral disc degeneration, lumbar region with discogenic back pain and lower extremity pain: Secondary | ICD-10-CM | POA: Diagnosis not present

## 2023-02-21 DIAGNOSIS — G90529 Complex regional pain syndrome I of unspecified lower limb: Secondary | ICD-10-CM | POA: Diagnosis not present

## 2023-02-25 ENCOUNTER — Ambulatory Visit: Payer: Medicare HMO

## 2023-02-26 NOTE — Progress Notes (Signed)
Carelink Summary Report / Loop Recorder 

## 2023-03-06 ENCOUNTER — Ambulatory Visit: Payer: Medicare HMO | Admitting: Pulmonary Disease

## 2023-03-06 ENCOUNTER — Encounter: Payer: Self-pay | Admitting: Pulmonary Disease

## 2023-03-06 VITALS — BP 110/60 | HR 84 | Ht 69.0 in | Wt 198.0 lb

## 2023-03-06 DIAGNOSIS — J449 Chronic obstructive pulmonary disease, unspecified: Secondary | ICD-10-CM | POA: Diagnosis not present

## 2023-03-06 NOTE — Patient Instructions (Signed)
Continue Symbicort 2 puffs twice daily - rinse mouth out after each use  Continue duoneb nebulizer treatment as needed  Continue your physical activity regimen with the upright walker  Follow up in 6 months, call sooner if needed

## 2023-03-06 NOTE — Progress Notes (Signed)
Synopsis: Referred in November 2023 for COPD by Arville Care, MD  Subjective:   PATIENT ID: Antonio Bowen GENDER: male DOB: [birth year], MRN: [id number]  HPI  Chief Complaint  Patient presents with   Follow-up    Pt is here for F/U visit.   Antonio Bowen is a 77 year old male, former smoker with chronic hypoxemic respiratory failure, BOOP, GERD and hypertension who returns to pulmonary clinic for COPD.   He has been doing well since last visit. He needs to have a revision surgery of his back surgery again in January.   OV 07/31/22 He is being evaluated by [facility] for L4-5 hemifacetectomy/posterolateral fusion. It would be a 2 hour surgery.   He has been feeling well since last visit. He has increased his physical activity with walking multiple times per week.   He remains on symbicort 2 puffs twice daily and as needed albuterol. He is requesting a nebulizer machine and supplies.   OV 04/04/22 HRCT Chest 03/19/22 shows severe centrilobular and paraseptal emphysema. Subpleural scarring of right middle lobe. Scattered areas of subpleural ground glass, right greater than left. Overall, unchanged from CT Chest in 2021.   PFTs show mild obstruction, no significant bronchodilator response, air trapping and normal DLCO.   Initial OV 03/05/22 He reports significant dyspnea with brushing his teeth, showering or getting dressed in the mornings. He is using 3L of oxygen at night and occasionally during the day when he has severe shortness of breath. He uses a fan most of the day that blows on his face that provides relief. He has a hard time breathing during hot weather.   He is currently using symbicort 160-4.49mcg 2 puffs twice daily. He has tried trelegy/breztri in the past without much more relief.   He has sinus congestion and drainage where he is using netty pot rinses daily.   He has intermittent productive cough. He was on prednisone in July and reports he felt  significantly better during that time. He typically feels worse a couple days after steroid tapers are completed.   He has some seasonal allergies and is taking zyrtec and flonase daily.   He smoked for 40+ years and quit in 2014. 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 He grew up with second hand smoke in the home. He reports cutting some wood the other day and that seemed to aggravate his breathing due to the dust. He lives with his wife. He used to play golf but is unable now due to his breathing.   No family history of lung cancer.    Past Medical History:  Diagnosis Date   Allergic rhinitis    Arthritis    BOOP (bronchiolitis obliterans with organizing pneumonia) (HCC)    Bronchitis    Colon polyps    COPD (chronic obstructive pulmonary disease) (HCC)    Dyspnea    with exertion    Dysrhythmia    skipped beats- followed by Dr Clifton James    GERD (gastroesophageal reflux disease)    History of hiatal hernia    Hypertension    Hypothyroidism    Kidney stone    Low back pain    Neuropathy 2024   right leg   Oxygen dependent    uses 3L/[place] at nite occasional during day if needed    PONV (postoperative nausea and vomiting)  Stroke [facility])    hx of 2 strokes which patient did not know he had had . Weakness in left arm   Thyroid disease    Tobacco dependence      Family History  Problem Relation Age of Onset   Arthritis Father    Stroke Father 53   Prostate cancer Maternal Grandfather    Stomach cancer Maternal Grandmother    Cancer Paternal Grandmother    Colon cancer Other        mat 1st cousin   Cancer Mother    Healthy Daughter    Healthy Son    Pancreatic cancer Neg Hx    Esophageal cancer Neg Hx      Social History   Socioeconomic History   Marital status: Married    Spouse name: Becky   Number of children: 2   Years of education: 11    Highest education level: 11th grade  Occupational History   Occupation: Retired    Comment: [employer], 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, also worked in Materials engineer mills  Tobacco Use   Smoking status: Former    Current packs/day: 0.00    Average packs/day: 1 pack/day for 50.0 years (50.0 ttl pk-yrs)    Types: Cigarettes    Start date: 07/29/1960    Quit date: 07/30/2010    Years since quitting: 12.6   Smokeless tobacco: Current    Types: Snuff   Tobacco comments:    10-12 snuff/day (pouches)  Vaping Use   Vaping status: Never Used  Substance and Sexual Activity   Alcohol use: Yes    Comment: rare   Drug use: No   Sexual activity: Yes  Other Topics Concern   Not on file  Social History Narrative   Not on file   Social Determinants of Health   Financial Resource Strain: Low Risk  (07/10/2022)   Overall Financial Resource Strain (CARDIA)    Difficulty of Paying Living Expenses: Not hard at all  Food Insecurity: No Food Insecurity (07/10/2022)   Hunger Vital Sign    Worried About Running Out of Food in the Last Year: Never true    Ran Out of Food in the Last Year: Never true  Transportation Needs: No Transportation Needs (07/06/2021)   PRAPARE - Administrator, Civil Service (Medical): No    Lack of Transportation (Non-Medical): No  Physical Activity: Inactive (07/10/2022)   Exercise Vital Sign    Days of Exercise per Week: 0 days    Minutes of Exercise per Session: 0 min  Stress: No Stress Concern Present (07/10/2022)   [employer] of Occupational Health - Occupational Stress Questionnaire    Feeling of Stress : Not at all  Social Connections: Moderately Isolated (07/10/2022)   Social Connection and Isolation Panel [NHANES]    Frequency of Communication with Friends and Family: More than three times a week    Frequency of Social Gatherings with Friends and Family: More than three times a week    Attends Religious Services: Never    Database administrator or Organizations: No     Attends Banker Meetings: Never    Marital Status: Married  Catering manager Violence: Not At Risk (07/10/2022)   Humiliation, Afraid, Rape, and Kick questionnaire    Fear of Current or Ex-Partner: No    Emotionally Abused: No    Physically Abused: No    Sexually Abused: No     Allergies  Allergen Reactions   Nuts Nausea And  Vomiting    sweating   Other Nausea And Vomiting    Per patient, cannot take any narcotic. Nausea and vomiting sweating   Vicodin [Hydrocodone-Acetaminophen]     tachycardia   Codeine Nausea And Vomiting   Aspirin Other (See Comments)    Upset stomach, bleeding   Penicillins Hives and Rash    Has patient had a PCN reaction causing immediate rash, facial/tongue/throat swelling, SOB or lightheadedness with hypotension:No Has patient had a PCN reaction causing severe rash involving mucus membranes or skin necrosis:No Has patient had a PCN reaction that required hospitalization:No Has patient had a PCN reaction occurring within the last 10 years:Yes If all of the above answers are "NO", then may proceed with Cephalosporin use.      Outpatient Medications Prior to Visit  Medication Sig Dispense Refill   acetaminophen (TYLENOL) 500 MG tablet Take 1,000 mg by mouth 2 (two) times daily as needed (for pain.).     albuterol (VENTOLIN HFA) 108 (90 Base) MCG/ACT inhaler Inhale 1-2 puffs into the lungs every 4 (four) hours as needed for wheezing or shortness of breath. INHALE TWO PUFFS INTO LUNGS EVERY 6 HOURS AS NEEDED FOR WHEEZING 18 g 3   budesonide-formoterol (SYMBICORT) 160-4.5 MCG/ACT inhaler Inhale 2 puffs into the lungs 2 (two) times daily. in the morning and at bedtime. 30.6 g 3   cetirizine (ZYRTEC) 10 MG tablet Take 1 tablet (10 mg total) by mouth at bedtime. 90 tablet 3   clopidogrel (PLAVIX) 75 MG tablet Take 1 tablet (75 mg total) by mouth daily. 90 tablet 3   fluticasone (FLONASE) 50 MCG/ACT nasal spray Place 1 spray into both nostrils 2  (two) times daily as needed for allergies or rhinitis. 48 g 3   gabapentin (NEURONTIN) 300 MG capsule Take 300 mg by mouth 3 (three) times daily.     Homeopathic Products (LEG CRAMP RELIEF PO) Take 2 tablets by mouth at bedtime. Hyland leg cramp     ipratropium-albuterol (DUONEB) 0.5-2.5 (3) MG/3ML SOLN Take 3 mLs by nebulization every 4 (four) hours as needed. 360 mL 3   levothyroxine (SYNTHROID) 137 MCG tablet Take 1 tablet (137 mcg total) by mouth daily before breakfast. 90 tablet 3   losartan-hydrochlorothiazide (HYZAAR) 50-12.5 MG tablet Take 1 tablet by mouth daily. 90 tablet 3   magnesium oxide (MAG-OX) 400 MG tablet Take 400 mg by mouth at bedtime.      meloxicam (MOBIC) 15 MG tablet Take 1 tablet (15 mg total) by mouth daily. 90 tablet 3   omeprazole (PRILOSEC) 40 MG capsule Take 1 capsule (40 mg total) by mouth in the morning and at bedtime. 180 capsule 3   OXYGEN Inhale 3 L into the lungs at bedtime.     SUPER B COMPLEX/C PO Take 1 capsule by mouth at bedtime.     traMADol (ULTRAM) 50 MG tablet Take 100 mg by mouth 3 (three) times daily as needed (pain.).     traZODone (DESYREL) 50 MG tablet Take 0.5-1 tablets (25-50 mg total) by mouth at bedtime as needed for sleep. 90 tablet 3   methocarbamol (ROBAXIN) 500 MG tablet Take 1 tablet (500 mg total) by mouth every 6 (six) hours as needed for muscle spasms. (Patient not taking: Reported on 02/18/2023) 60 tablet 1   Facility-Administered Medications Prior to Visit  Medication Dose Route Frequency Provider Last Rate Last Admin   methylPREDNISolone acetate (DEPO-MEDROL) injection 80 mg  80 mg Intra-articular Once  methylPREDNISolone acetate (DEPO-MEDROL) injection 80 mg  80 mg Intra-articular Once        Review of Systems  Constitutional:  Negative for chills, fever, malaise/fatigue and weight loss.  HENT:  Negative for congestion, sinus pain and sore throat.   Eyes: Negative.   Respiratory:  Positive for cough and shortness of  breath. Negative for hemoptysis, sputum production and wheezing.   Cardiovascular:  Negative for chest pain, palpitations, orthopnea, claudication and leg swelling.  Gastrointestinal:  Negative for abdominal pain, heartburn, nausea and vomiting.  Genitourinary: Negative.   Musculoskeletal:  Negative for joint pain and myalgias.  Skin:  Negative for rash.  Neurological:  Negative for weakness.  Endo/Heme/Allergies: Negative.   Psychiatric/Behavioral: Negative.     Objective:   Vitals:   03/06/23 1506  BP: 110/60  Pulse: 84  SpO2: 95%  Weight: 198 lb (89.8 kg)  Height: 5\' 9"  (1.753 m)   Physical Exam Constitutional:      General: He is not in acute distress. HENT:     Head: Normocephalic and atraumatic.  Eyes:     Extraocular Movements: Extraocular movements intact.     Conjunctiva/sclera: Conjunctivae normal.  Cardiovascular:     Rate and Rhythm: Normal rate and regular rhythm.     Pulses: Normal pulses.     Heart sounds: Normal heart sounds. No murmur heard. Pulmonary:     Breath sounds: Decreased air movement present. No wheezing, rhonchi or rales.  Musculoskeletal:     Right lower leg: No edema.     Left lower leg: No edema.  Skin:    General: Skin is warm and dry.  Neurological:     General: No focal deficit present.     Mental Status: He is alert.    CBC    Component Value Date/Time   WBC 9.8 12/10/2022 1633   WBC 12.3 (H) 03/08/2022 0949   RBC 5.01 12/10/2022 1633   RBC 4.77 03/08/2022 0949   HGB 14.9 12/10/2022 1633   HCT 44.6 12/10/2022 1633   PLT 226 12/10/2022 1633   MCV 89 12/10/2022 1633   MCH 29.7 12/10/2022 1633   MCH 32.3 11/03/2019 1140   MCHC 33.4 12/10/2022 1633   MCHC 33.3 03/08/2022 0949   RDW 13.1 12/10/2022 1633   LYMPHSABS 3.0 12/10/2022 1633   MONOABS 0.7 03/08/2022 0949   EOSABS 0.1 12/10/2022 1633   BASOSABS 0.1 12/10/2022 1633      Latest Ref Rng & Units 12/10/2022    4:33 PM 08/01/2022   11:35 AM 02/15/2022    2:47 PM  BMP   Glucose 70 - 99 mg/dL 914  89  782   BUN 8 - 27 mg/dL 21  21  25    Creatinine 0.76 - 1.27 mg/dL 9.56  2.13  0.86   BUN/Creat Ratio 10 - 24 9  10  12    Sodium 134 - 144 mmol/L 140  141  140   Potassium 3.5 - 5.2 mmol/L 4.7  4.3  4.1   Chloride 96 - 106 mmol/L 106  104  104   CO2 20 - 29 mmol/L 21  20  22    Calcium 8.6 - 10.2 mg/dL 9.3  9.3  9.3     Chest imaging: CT Chest 03/19/22 1. Scattered areas of minimal subpleural ground-glass, right greater than left, nonspecific. Findings may reflect scarring. Difficult to exclude interstitial lung disease such as nonspecific interstitial pneumonitis. Findings are indeterminate for UIP per consensus guidelines: Diagnosis of Idiopathic Pulmonary Fibrosis: An  Official ATS/ERS/JRS/ALAT Clinical Practice Guideline. Am Rosezetta Schlatter Crit Care Med Vol 198, Iss 5, [phone], Dec 29 2016. 2. Aortic atherosclerosis (ICD10-I70.0). Coronary artery calcification. 3.  Emphysema  CXR 07/2019 The heart size and mediastinal contours are within normal limits. Unchanged scarring of the upper lungs. The visualized skeletal structures are unremarkable.  PFT:    Latest Ref Rng & Units 03/08/2022    9:45 AM  PFT Results  FVC-Pre L 4.42   FVC-Predicted Pre % 114   FVC-Post L 4.53   FVC-Predicted Post % 117   Pre FEV1/FVC % % 72   Post FEV1/FCV % % 69   FEV1-Pre L 3.16   FEV1-Predicted Pre % 113   FEV1-Post L 3.11   DLCO uncorrected ml/min/mmHg 19.66   DLCO UNC% % 83   DLCO corrected ml/min/mmHg 19.66   DLCO COR %Predicted % 83   DLVA Predicted % 71   TLC L 7.93   TLC % Predicted % 119   RV % Predicted % 134     Labs:  Path:  Echo:  Heart Catheterization:  Assessment & Plan:   Chronic obstructive pulmonary disease, unspecified COPD type (HCC)  Discussion: Antonio Bowen is a 77 year old male, former smoker with chronic hypoxemic respiratory failure, BOOP, GERD and hypertension who returns to pulmonary clinic for COPD.   His CT Chest scan  and PFTs are overall stable since 2021. No significant bronchodilator response. He does not have significant elevation in eosinophils or IgE. We discussed that he would not likely benefit from trial of biologic at this time.  He is to continue symbicort twice daily. We have provided him with samples of brezri today per his request.  He is to continue his physical activity regimen in order to stay in shape for his upcoming revision back surgery.  In regards to his surgical planning, based on ARISCAT index he is intermediate risk, 13.3% risk of in-hospital post-op pulmonary complications (composite including respiratory failure, respiratory infection, pleural effusion, atelectasis, pneumothorax, bronchospasm, aspiration pneumonitis). Recommend he be extubated to bipap/cpap in the PACU for a period of time and maintained on budesonide/brovana/yupelri nebs while in the hospital.  Follow up in 6 months.  Melody Comas, MD [facility] Office: [phone]    Current Outpatient Medications:    acetaminophen (TYLENOL) 500 MG tablet, Take 1,000 mg by mouth 2 (two) times daily as needed (for pain.)., Disp: , Rfl:    albuterol (VENTOLIN HFA) 108 (90 Base) MCG/ACT inhaler, Inhale 1-2 puffs into the lungs every 4 (four) hours as needed for wheezing or shortness of breath. INHALE TWO PUFFS INTO LUNGS EVERY 6 HOURS AS NEEDED FOR WHEEZING, Disp: 18 g, Rfl: 3   budesonide-formoterol (SYMBICORT) 160-4.5 MCG/ACT inhaler, Inhale 2 puffs into the lungs 2 (two) times daily. in the morning and at bedtime., Disp: 30.6 g, Rfl: 3   cetirizine (ZYRTEC) 10 MG tablet, Take 1 tablet (10 mg total) by mouth at bedtime., Disp: 90 tablet, Rfl: 3   clopidogrel (PLAVIX) 75 MG tablet, Take 1 tablet (75 mg total) by mouth daily., Disp: 90 tablet, Rfl: 3   fluticasone (FLONASE) 50 MCG/ACT nasal spray, Place 1 spray into both nostrils 2 (two) times daily as needed for allergies or rhinitis., Disp: 48 g, Rfl:  3   gabapentin (NEURONTIN) 300 MG capsule, Take 300 mg by mouth 3 (three) times daily., Disp: , Rfl:    Homeopathic Products (LEG CRAMP RELIEF PO), Take 2 tablets by mouth at bedtime. [place]  leg cramp, Disp: , Rfl:    ipratropium-albuterol (DUONEB) 0.5-2.5 (3) MG/3ML SOLN, Take 3 mLs by nebulization every 4 (four) hours as needed., Disp: 360 mL, Rfl: 3   levothyroxine (SYNTHROID) 137 MCG tablet, Take 1 tablet (137 mcg total) by mouth daily before breakfast., Disp: 90 tablet, Rfl: 3   losartan-hydrochlorothiazide (HYZAAR) 50-12.5 MG tablet, Take 1 tablet by mouth daily., Disp: 90 tablet, Rfl: 3   magnesium oxide (MAG-OX) 400 MG tablet, Take 400 mg by mouth at bedtime. , Disp: , Rfl:    meloxicam (MOBIC) 15 MG tablet, Take 1 tablet (15 mg total) by mouth daily., Disp: 90 tablet, Rfl: 3   omeprazole (PRILOSEC) 40 MG capsule, Take 1 capsule (40 mg total) by mouth in the morning and at bedtime., Disp: 180 capsule, Rfl: 3   OXYGEN, Inhale 3 L into the lungs at bedtime., Disp: , Rfl:    SUPER B COMPLEX/C PO, Take 1 capsule by mouth at bedtime., Disp: , Rfl:    traMADol (ULTRAM) 50 MG tablet, Take 100 mg by mouth 3 (three) times daily as needed (pain.)., Disp: , Rfl:    traZODone (DESYREL) 50 MG tablet, Take 0.5-1 tablets (25-50 mg total) by mouth at bedtime as needed for sleep., Disp: 90 tablet, Rfl: 3  Current Facility-Administered Medications:    methylPREDNISolone acetate (DEPO-MEDROL) injection 80 mg, 80 mg, Intra-articular, Once,    methylPREDNISolone acetate (DEPO-MEDROL) injection 80 mg, 80 mg, Intra-articular, Once,

## 2023-03-08 ENCOUNTER — Encounter: Payer: Self-pay | Admitting: Pulmonary Disease

## 2023-03-12 ENCOUNTER — Telehealth: Payer: Self-pay

## 2023-03-12 NOTE — Telephone Encounter (Signed)
Loop recorder implanted 11/05/2019 by Dr. Johney Frame for cryptogenic stroke.  Pt requests no longer to be monitored.  No atrial fibrillation has been identified by ILR monitoring.  Will make EP physician aware.  Remote checks have been scheduled.

## 2023-03-12 NOTE — Telephone Encounter (Signed)
Pt called and does not want to be monitored anymore because he keeps getting notifications that its not transmitting and he is frustrated about its functionality. I have canceled all appts and marked him I in paceart. I did not dismiss him from [facility]

## 2023-03-13 NOTE — Telephone Encounter (Signed)
Spoke with patient.  He states he has had his remote monitor unplugged for nearly 2 months.  No way he could be transmitting.   Last transmission sent was on 01/28/23.  Summary period 9/3-10/14 transmission sent on 10/14 which captured the last actual transmission on 01/28/23.    He has the following complaints:   He is "talking to another provider who is going to set up explant of the device". Does not wish for me to set this up.  He wants to know why his insurance is still being billed for his transmission?  I have asked him to get me specific dates so that I can have it looked at.  He says " I can't do that, I just know they have been sending me information that says its been billed and that has put me in the donut hole."  He would like this investigated.   He confirms that he does not want anything further with his device. States "it missed 2 strokes, thought that was why I had it to see if I had a stroke or a heart attack".   I explained to patient that this device does not see or trigger if you have a CVA or an MI.  I explained that it is a monitor tool to help [place] determine possible underlying causes in relation to arrhythmias that may contribute to these events, but is not designed to see CVA or MI's as these are clot/ischemic or bleed related issues that the device does not detect.  Patient thanked me for the explanation, states he was unaware.   Will forward for investigation.

## 2023-03-18 ENCOUNTER — Ambulatory Visit: Payer: Medicare HMO

## 2023-04-01 ENCOUNTER — Ambulatory Visit: Payer: Medicare HMO

## 2023-04-02 DIAGNOSIS — H2512 Age-related nuclear cataract, left eye: Secondary | ICD-10-CM | POA: Diagnosis not present

## 2023-04-04 ENCOUNTER — Telehealth: Payer: Self-pay | Admitting: Internal Medicine

## 2023-04-04 NOTE — Telephone Encounter (Signed)
Pt needs to change the time of his Pre-Op Tele Visit

## 2023-04-04 NOTE — Telephone Encounter (Signed)
S/w the pt and he has changed his tele pre op appt to 04/16/23 2 pm.

## 2023-04-15 ENCOUNTER — Telehealth: Payer: Medicare HMO

## 2023-04-15 DIAGNOSIS — H25812 Combined forms of age-related cataract, left eye: Secondary | ICD-10-CM | POA: Diagnosis not present

## 2023-04-15 DIAGNOSIS — F419 Anxiety disorder, unspecified: Secondary | ICD-10-CM | POA: Diagnosis not present

## 2023-04-15 DIAGNOSIS — E039 Hypothyroidism, unspecified: Secondary | ICD-10-CM | POA: Diagnosis not present

## 2023-04-15 DIAGNOSIS — H2512 Age-related nuclear cataract, left eye: Secondary | ICD-10-CM | POA: Diagnosis not present

## 2023-04-16 ENCOUNTER — Ambulatory Visit: Payer: Medicare HMO | Attending: Cardiology | Admitting: Nurse Practitioner

## 2023-04-16 DIAGNOSIS — Z01810 Encounter for preprocedural cardiovascular examination: Secondary | ICD-10-CM

## 2023-04-16 NOTE — Progress Notes (Signed)
Virtual Visit via Telephone Note   Because of Antonio Bowen's co-morbid illnesses, he is at least at moderate risk for complications without adequate follow up.  This format is felt to be most appropriate for this patient at this time.  The patient did not have access to video technology/had technical difficulties with video requiring transitioning to audio format only (telephone).  All issues noted in this document were discussed and addressed.  No physical exam could be performed with this format.  Please refer to the patient's chart for his consent to telehealth for [facility].  Evaluation Performed:  Preoperative cardiovascular risk assessment _____________   Date:  04/16/2023   Patient ID:  Antonio Bowen, DOB Apr 29, 1946, MRN [id number] Patient Location:  Home Provider location:   Office  Primary Care Provider:  [name], Elige Radon, MD Primary Cardiologist:  Christell Constant, MD  Chief Complaint / Patient Profile   77 y.o. y/o male with a h/o coronary artery calcification, PACs, PVCs, and cryptogenic stroke who is pending  L4-5 LUMBAR FUSION / REVISION on 05/06/2023 with Dr. Marikay Alar of [facility] and presents today for telephonic preoperative cardiovascular risk assessment.  History of Present Illness    Antonio Bowen is a 77 y.o. male who presents via audio/video conferencing for a telehealth visit today.  Pt was last seen in cardiology clinic on 08/06/2022 by Dr. Timoteo Gaul. At that time Antonio Bowen was doing well.  The patient is now pending procedure as outlined above. Since his last visit, he has done well from a cardiac standpoint.   He denies chest pain, palpitations, dyspnea, pnd, orthopnea, n, v, dizziness, syncope, edema, weight gain, or early satiety. All other systems reviewed and are otherwise negative except as noted above.   Past Medical History    Past Medical History:  Diagnosis Date   Allergic rhinitis     Arthritis    BOOP (bronchiolitis obliterans with organizing pneumonia) (HCC)    Bronchitis    Colon polyps    COPD (chronic obstructive pulmonary disease) (HCC)    Dyspnea    with exertion    Dysrhythmia    skipped beats- followed by Dr Clifton James    GERD (gastroesophageal reflux disease)    History of hiatal hernia    Hypertension    Hypothyroidism    Kidney stone    Low back pain    Neuropathy 2024   right leg   Oxygen dependent    uses 3L/[place] at nite occasional during day if needed    PONV (postoperative nausea and vomiting)    Stroke (HCC)    hx of 2 strokes which patient did not know he had had . Weakness in left arm   Thyroid disease    Tobacco dependence    Past Surgical History:  Procedure Laterality Date   BALLOON DILATION N/A 01/30/2022   Procedure: BALLOON DILATION;  Surgeon: Hilarie Fredrickson, MD;  Location: [facility];  Service: Gastroenterology;  Laterality: N/A;   BIOPSY  08/18/2019   Procedure: BIOPSY;  Surgeon: Hilarie Fredrickson, MD;  Location: [facility];  Service: Endoscopy;;   broncoscopy      CARDIAC CATHETERIZATION  07/03/2000   carpel tunnal     left   CATARACT EXTRACTION Right    COLONOSCOPY     x 4   DUPUYTREN CONTRACTURE RELEASE     Left   ESOPHAGOGASTRODUODENOSCOPY (EGD) WITH PROPOFOL N/A 03/26/2016   Procedure: ESOPHAGOGASTRODUODENOSCOPY (EGD) WITH PROPOFOL;  Surgeon: Hilarie Fredrickson, MD;  Location: [facility];  Service: Endoscopy;  Laterality: N/A;   ESOPHAGOGASTRODUODENOSCOPY (EGD) WITH PROPOFOL N/A 08/18/2019   Procedure: ESOPHAGOGASTRODUODENOSCOPY (EGD) WITH PROPOFOL;  Surgeon: Hilarie Fredrickson, MD;  Location: [facility];  Service: Endoscopy;  Laterality: N/A;  Possible dilation   ESOPHAGOGASTRODUODENOSCOPY (EGD) WITH PROPOFOL N/A 01/30/2022   Procedure: ESOPHAGOGASTRODUODENOSCOPY (EGD) WITH PROPOFOL;  Surgeon: Hilarie Fredrickson, MD;  Location: [facility];  Service: Gastroenterology;  Laterality: N/A;   ESOPHAGOGASTRODUODENOSCOPY ENDOSCOPY     x 3    FACIAL FRACTURE SURGERY     1970s   LAMINECTOMY WITH POSTERIOR LATERAL ARTHRODESIS LEVEL 1 Right 08/17/2022   Procedure: Right - L4-L5 hemifacetectomy, posterolateral fusion, non-segmental instrumentation L4-5;  Surgeon: Tia Alert, MD;  Location: [facility] OR;  Service: Neurosurgery;  Laterality: Right;   LOOP RECORDER INSERTION N/A 11/05/2019   Procedure: LOOP RECORDER INSERTION;  Surgeon: Hillis Range, MD;  Location: [facility];  Service: Cardiovascular;  Laterality: N/A;   OTHER SURGICAL HISTORY     facial surgery   SAVORY DILATION N/A 08/18/2019   Procedure: SAVORY DILATION;  Surgeon: Hilarie Fredrickson, MD;  Location: [facility];  Service: Endoscopy;  Laterality: N/A;   TOTAL HIP ARTHROPLASTY  01/2011   Right hip, [place]   ULNAR NERVE TRANSPOSITION     left    Allergies  Allergies  Allergen Reactions   Nuts Nausea And Vomiting    sweating   Other Nausea And Vomiting    Per patient, cannot take any narcotic. Nausea and vomiting sweating   Vicodin [Hydrocodone-Acetaminophen]     tachycardia   Codeine Nausea And Vomiting   Aspirin Other (See Comments)    Upset stomach, bleeding   Penicillins Hives and Rash    Has patient had a PCN reaction causing immediate rash, facial/tongue/throat swelling, SOB or lightheadedness with hypotension:No Has patient had a PCN reaction causing severe rash involving mucus membranes or skin necrosis:No Has patient had a PCN reaction that required hospitalization:No Has patient had a PCN reaction occurring within the last 10 years:Yes If all of the above answers are "NO", then may proceed with Cephalosporin use.     Home Medications    Prior to Admission medications   Medication Sig Start Date End Date Taking? Authorizing Provider  acetaminophen (TYLENOL) 500 MG tablet Take 1,000 mg by mouth every 6 (six) hours as needed (for pain.).    [provider]  albuterol (VENTOLIN HFA) 108 (90 Base) MCG/ACT inhaler Inhale 1-2 puffs  into the lungs every 4 (four) hours as needed for wheezing or shortness of breath. INHALE TWO PUFFS INTO LUNGS EVERY 6 HOURS AS NEEDED FOR WHEEZING 12/10/22   [name], Elige Radon, MD  budesonide-formoterol (SYMBICORT) 160-4.5 MCG/ACT inhaler Inhale 2 puffs into the lungs 2 (two) times daily. in the morning and at bedtime. 12/10/22   [name], Elige Radon, MD  cetirizine (ZYRTEC) 10 MG tablet Take 1 tablet (10 mg total) by mouth at bedtime. 12/10/22   [name], Elige Radon, MD  clopidogrel (PLAVIX) 75 MG tablet Take 1 tablet (75 mg total) by mouth daily. 12/10/22   [name], Elige Radon, MD  fluticasone (FLONASE) 50 MCG/ACT nasal spray Place 1 spray into both nostrils 2 (two) times daily as needed for allergies or rhinitis. Patient taking differently: Place 1 spray into both nostrils in the morning and at bedtime. 12/10/22   [name], Elige Radon, MD  gabapentin (NEURONTIN) 300 MG capsule Take 300 mg by mouth in the morning and  at bedtime.    [provider]  Homeopathic Products (LEG CRAMP RELIEF PO) Take 2 tablets by mouth at bedtime. [place] leg cramp    [provider]  ipratropium-albuterol (DUONEB) 0.5-2.5 (3) MG/3ML SOLN Take 3 mLs by nebulization every 4 (four) hours as needed. 12/10/22   [name], Elige Radon, MD  levothyroxine (SYNTHROID) 137 MCG tablet Take 1 tablet (137 mcg total) by mouth daily before breakfast. 12/10/22   [name], Elige Radon, MD  losartan-hydrochlorothiazide (HYZAAR) 50-12.5 MG tablet Take 1 tablet by mouth daily. 12/10/22   [name], Elige Radon, MD  magnesium oxide (MAG-OX) 400 MG tablet Take 400 mg by mouth at bedtime.     [provider]  meloxicam (MOBIC) 15 MG tablet Take 1 tablet (15 mg total) by mouth daily. 12/10/22   [name], Elige Radon, MD  omeprazole (PRILOSEC) 40 MG capsule Take 1 capsule (40 mg total) by mouth in the morning and at bedtime. 12/10/22   [name], Elige Radon, MD  OXYGEN Inhale 3 L into the lungs at bedtime.    [provider]   prednisoLONE acetate (PRED FORTE) 1 % ophthalmic suspension Place 1 drop into the left eye See admin instructions. INSTILL 1 DROP INTO LEFT EYE 4 TIMES DAILY FOR 7 DAYS THEN INSTILL 1 DROP THREE TIMES DAILY FOR 7 DAYS THEN INSTILL 1 DROP TWICE DAILY FOR 7 DAYS THEN INSTILL 1 DROP ONCE DAILY FOR 7 DAYS. START AFTER EYE SURGERY 04/08/23   [provider]  SUPER B COMPLEX/C PO Take 1 capsule by mouth at bedtime.    [provider]  traMADol (ULTRAM) 50 MG tablet Take 100 mg by mouth 4 (four) times daily as needed (pain.). 06/06/21   [provider]  traZODone (DESYREL) 50 MG tablet Take 0.5-1 tablets (25-50 mg total) by mouth at bedtime as needed for sleep. 12/10/22   [name], Elige Radon, MD    Physical Exam    Vital Signs:  Antonio Bowen does not have vital signs available for review today.  Given telephonic nature of communication, physical exam is limited. AAOx3. NAD. Normal affect.  Speech and respirations are unlabored.  Accessory Clinical Findings    None  Assessment & Plan    1.  Preoperative Cardiovascular Risk Assessment:  According to the Revised Cardiac Risk Index (RCRI), his Perioperative Risk of Major Cardiac Event is (%): 0.9. His Functional Capacity in METs is: 5.62 according to the Duke Activity Status Index (DASI). Therefore, based on ACC/AHA guidelines, patient would be at acceptable risk for the planned procedure without further cardiovascular testing.   The patient was advised that if he develops new symptoms prior to surgery to contact our office to arrange for a follow-up visit, and he verbalized understanding.  Patient takes Plavix for history of cryptogenic stroke, this is not managed by cardiology. Recommendations for holding Plavix prior to surgery because should come from managing provider.   A copy of this note will be routed to requesting surgeon.  Time:   Today, I have spent 7 minutes with the patient with telehealth technology  discussing medical history, symptoms, and management plan.     Joylene Grapes, NP  04/16/2023, 2:10 PM

## 2023-04-22 ENCOUNTER — Telehealth: Payer: Self-pay | Admitting: Internal Medicine

## 2023-04-22 ENCOUNTER — Ambulatory Visit: Payer: Medicare HMO

## 2023-04-22 NOTE — Telephone Encounter (Signed)
Patient called to report he will be having back surgery on 05/06/23 with Dr. Yetta Barre.

## 2023-04-22 NOTE — Pre-Procedure Instructions (Signed)
Surgical Instructions   Your procedure is scheduled on May 06, 2023. Report to [facility] Main Entrance "A" at 9:00 A.M., then check in with the Admitting office. Any questions or running late day of surgery: call [phone]  Questions prior to your surgery date: call [phone], Monday-Friday, 8am-4pm. If you experience any cold or flu symptoms such as cough, fever, chills, shortness of breath, etc. between now and your scheduled surgery, please notify us at the above number.     Remember:  Do not eat or drink after midnight the night before your surgery    Take these medicines the morning of surgery with A SIP OF WATER: budesonide-formoterol (SYMBICORT) inhaler  fluticasone (FLONASE) nasal spray  gabapentin (NEURONTIN)  levothyroxine (SYNTHROID)  omeprazole (PRILOSEC)  prednisoLONE acetate (PRED FORTE) ophthalmic suspension    May take these medicines IF NEEDED: acetaminophen (TYLENOL)  albuterol (VENTOLIN HFA) inhaler - please bring inhaler with you day of surgery ipratropium-albuterol (DUONEB) nebulizer traMADol (ULTRAM)    Follow your surgeon's instructions on when to stop clopidogrel (PLAVIX).  If no instructions were given by your surgeon then you will need to call the office to get those instructions.     One week prior to surgery, STOP taking any Aspirin (unless otherwise instructed by your surgeon) Aleve, Naproxen, Ibuprofen, Motrin, Advil, Goody's, BC's, all herbal medications, fish oil, and non-prescription vitamins. This includes your medication: meloxicam (MOBIC)                      Do NOT Smoke (Tobacco/Vaping) for 24 hours prior to your procedure.  If you use a CPAP at night, you may bring your mask/headgear for your overnight stay.   You will be asked to remove any contacts, glasses, piercing's, hearing aid's, dentures/partials prior to surgery. Please bring cases for these items if needed.    Patients discharged the day of surgery will not be  allowed to drive home, and someone needs to stay with them for 24 hours.  SURGICAL WAITING ROOM VISITATION Patients may have no more than 2 support people in the waiting area - these visitors may rotate.   Pre-op nurse will coordinate an appropriate time for 1 ADULT support person, who may not rotate, to accompany patient in pre-op.  Children under the age of 36 must have an adult with them who is not the patient and must remain in the main waiting area with an adult.  If the patient needs to stay at the hospital during part of their recovery, the visitor guidelines for inpatient rooms apply.  Please refer to the [facility] website for the visitor guidelines for any additional information.   If you received a COVID test during your pre-op visit  it is requested that you wear a mask when out in public, stay away from anyone that may not be feeling well and notify your surgeon if you develop symptoms. If you have been in contact with anyone that has tested positive in the last 10 days please notify you surgeon.      Pre-operative 5 CHG Bathing Instructions   You can play a key role in reducing the risk of infection after surgery. Your skin needs to be as free of germs as possible. You can reduce the number of germs on your skin by washing with CHG (chlorhexidine gluconate) soap before surgery. CHG is an antiseptic soap that kills germs and continues to kill germs even after washing.   DO NOT use if you have  an allergy to chlorhexidine/CHG or antibacterial soaps. If your skin becomes reddened or irritated, stop using the CHG and notify one of our RNs at [phone].   Please shower with the CHG soap starting 4 days before surgery using the following schedule:     Please keep in mind the following:  DO NOT shave, including legs and underarms, starting the day of your first shower.   You may shave your face at any point before/day of surgery.  Place clean sheets on your bed the day you  start using CHG soap. Use a clean washcloth (not used since being washed) for each shower. DO NOT sleep with pets once you start using the CHG.   CHG Shower Instructions:  Wash your face and private area with normal soap. If you choose to wash your hair, wash first with your normal shampoo.  After you use shampoo/soap, rinse your hair and body thoroughly to remove shampoo/soap residue.  Turn the water OFF and apply about 3 tablespoons (45 ml) of CHG soap to a CLEAN washcloth.  Apply CHG soap ONLY FROM YOUR NECK DOWN TO YOUR TOES (washing for 3-5 minutes)  DO NOT use CHG soap on face, private areas, open wounds, or sores.  Pay special attention to the area where your surgery is being performed.  If you are having back surgery, having someone wash your back for you may be helpful. Wait 2 minutes after CHG soap is applied, then you may rinse off the CHG soap.  Pat dry with a clean towel  Put on clean clothes/pajamas   If you choose to wear lotion, please use ONLY the CHG-compatible lotions on the back of this paper.   Additional instructions for the day of surgery: DO NOT APPLY any lotions, deodorants, cologne, or perfumes.   Do not bring valuables to the hospital. [facility] is not responsible for any belongings/valuables. Do not wear nail polish, gel polish, artificial nails, or any other type of covering on natural nails (fingers and toes) Do not wear jewelry or makeup Put on clean/comfortable clothes.  Please brush your teeth.  Ask your nurse before applying any prescription medications to the skin.     CHG Compatible Lotions   Aveeno Moisturizing lotion  Cetaphil Moisturizing Cream  Cetaphil Moisturizing Lotion  Clairol Herbal Essence Moisturizing Lotion, Dry Skin  Clairol Herbal Essence Moisturizing Lotion, Extra Dry Skin  Clairol Herbal Essence Moisturizing Lotion, Normal Skin  Curel Age Defying Therapeutic Moisturizing Lotion with Alpha Hydroxy  Curel Extreme Care Body  Lotion  Curel Soothing Hands Moisturizing Hand Lotion  Curel Therapeutic Moisturizing Cream, Fragrance-Free  Curel Therapeutic Moisturizing Lotion, Fragrance-Free  Curel Therapeutic Moisturizing Lotion, Original Formula  Eucerin Daily Replenishing Lotion  Eucerin Dry Skin Therapy Plus Alpha Hydroxy Crme  Eucerin Dry Skin Therapy Plus Alpha Hydroxy Lotion  Eucerin Original Crme  Eucerin Original Lotion  Eucerin Plus Crme Eucerin Plus Lotion  Eucerin TriLipid Replenishing Lotion  Keri Anti-Bacterial Hand Lotion  Keri Deep Conditioning Original Lotion Dry Skin Formula Softly Scented  Keri Deep Conditioning Original Lotion, Fragrance Free Sensitive Skin Formula  Keri Lotion Fast Absorbing Fragrance Free Sensitive Skin Formula  Keri Lotion Fast Absorbing Softly Scented Dry Skin Formula  Keri Original Lotion  Keri Skin Renewal Lotion Keri Silky Smooth Lotion  Keri Silky Smooth Sensitive Skin Lotion  Nivea Body Creamy Conditioning Oil  Nivea Body Extra Enriched Teacher, adult education Moisturizing Lotion Nivea Crme  Nivea Skin Firming Lotion  NutraDerm 30 Skin Lotion  NutraDerm Skin Lotion  NutraDerm Therapeutic Skin Cream  NutraDerm Therapeutic Skin Lotion  ProShield Protective Hand Cream  Provon moisturizing lotion  Please read over the following fact sheets that you were given.

## 2023-04-23 NOTE — Telephone Encounter (Signed)
Preoperative team, please resend/ fax patient's preoperative cardiac clearance note to requesting office.  Thank you for your help.  Thomasene Ripple. Crystie Yanko NP-C     04/23/2023, 9:14 AM [facility] [address] Office [phone] Fax [phone]

## 2023-04-23 NOTE — Telephone Encounter (Signed)
Preoperative team, please contact requesting office and let them know that we will need details surrounding patients back surgery.  Once we receive preoperative formal clearance request we will be able to provide recommendations on upcoming surgery.  Thank you for your help.  Thomasene Ripple. Yvonna Brun NP-C     04/23/2023, 8:29 AM [facility] [address] Office [phone] Fax [phone]

## 2023-04-23 NOTE — Telephone Encounter (Signed)
Clearance has already been addressed. See phone note from 02/18/23 and pre-op clearance tele visit notes.

## 2023-04-23 NOTE — Telephone Encounter (Signed)
I have re-faxed pre-op clearance to requesting provider.

## 2023-04-25 ENCOUNTER — Encounter (HOSPITAL_COMMUNITY): Payer: Self-pay

## 2023-04-25 ENCOUNTER — Other Ambulatory Visit: Payer: Self-pay

## 2023-04-25 ENCOUNTER — Encounter (HOSPITAL_COMMUNITY)
Admission: RE | Admit: 2023-04-25 | Discharge: 2023-04-25 | Disposition: A | Discharge status: Home / Self Care | Payer: Medicare HMO | Source: Ambulatory Visit | Attending: Neurological Surgery | Admitting: Neurological Surgery

## 2023-04-25 VITALS — BP 113/73 | HR 88 | Temp 98.2°F | Resp 18 | Ht 69.0 in | Wt 203.0 lb

## 2023-04-25 DIAGNOSIS — Z01818 Encounter for other preprocedural examination: Secondary | ICD-10-CM

## 2023-04-25 DIAGNOSIS — I2510 Atherosclerotic heart disease of native coronary artery without angina pectoris: Secondary | ICD-10-CM | POA: Insufficient documentation

## 2023-04-25 DIAGNOSIS — Z01812 Encounter for preprocedural laboratory examination: Secondary | ICD-10-CM | POA: Insufficient documentation

## 2023-04-25 HISTORY — DX: Personal history of urinary calculi: Z87.442

## 2023-04-25 HISTORY — DX: Pneumonia, unspecified organism: J18.9

## 2023-04-25 HISTORY — DX: Other complications of anesthesia, initial encounter: T88.59XA

## 2023-04-25 LAB — CBC
HCT: 44.5 % (ref 39.0–52.0)
Hemoglobin: 14.4 g/dL (ref 13.0–17.0)
MCH: 30.6 pg (ref 26.0–34.0)
MCHC: 32.4 g/dL (ref 30.0–36.0)
MCV: 94.7 fL (ref 80.0–100.0)
Platelets: 194 10*3/uL (ref 150–400)
RBC: 4.7 MIL/uL (ref 4.22–5.81)
RDW: 13 % (ref 11.5–15.5)
WBC: 9.6 10*3/uL (ref 4.0–10.5)
nRBC: 0 % (ref 0.0–0.2)

## 2023-04-25 LAB — SURGICAL PCR SCREEN
MRSA, PCR: NEGATIVE
Staphylococcus aureus: POSITIVE — AB

## 2023-04-25 LAB — BASIC METABOLIC PANEL
Anion gap: 11 (ref 5–15)
BUN: 22 mg/dL (ref 8–23)
CO2: 23 mmol/L (ref 22–32)
Calcium: 9.2 mg/dL (ref 8.9–10.3)
Chloride: 107 mmol/L (ref 98–111)
Creatinine, Ser: 2.37 mg/dL — ABNORMAL HIGH (ref 0.61–1.24)
GFR, Estimated: 28 mL/min — ABNORMAL LOW (ref >60)
Glucose, Bld: 99 mg/dL (ref 70–99)
Potassium: 4.1 mmol/L (ref 3.5–5.1)
Sodium: 141 mmol/L (ref 135–145)

## 2023-04-25 LAB — PROTIME-INR
INR: 1 (ref 0.8–1.2)
Prothrombin Time: 13 s (ref 11.4–15.2)

## 2023-04-25 LAB — TYPE AND SCREEN
ABO/RH(D): A POS
Antibody Screen: NEGATIVE

## 2023-04-25 NOTE — Progress Notes (Addendum)
PCP - Dr. Ivin Booty [name]  Cardiologist - Dr. Riley Lam - last office visit 08/06/2022 Pulmonologist - Dr. Melody Comas - last office visit 03/06/2023  PPM/ICD - Denies - pt does have loop recorder Device Orders - n/a Rep Notified - n/a  Chest x-ray - n/a EKG - 08/01/2022 Stress Test - Denies ECHO - 11/04/2019 Cardiac Cath - Per pt, had one in 2010 and results were normal  Sleep Study - Denies CPAP - n/a  No DM  Last dose of GLP1 agonist- n/a GLP1 instructions: n/a  Blood Thinner Instructions: Pt instructed to call surgeon's office to receive Plavix hold instructions Aspirin Instructions: n/a  NPO after midnight  COVID TEST- n/a   Anesthesia review: Yes. Cardiac clearance. Pulmonary clearance noted in last office note. Pt also wears 3L nasal cannula O2 nightly. He will occasionally use oxygen during the day if he gets SOB. Pt on Plavix for stroke prevention (4 CVAs in the past) and it is managed by his PCP  Patient denies shortness of breath, fever, cough and chest pain at PAT appointment. Pt denies any respiratory illness/infection in the last two months.    All instructions explained to the patient, with a verbal understanding of the material. Patient agrees to go over the instructions while at home for a better understanding. Patient also instructed to self quarantine after being tested for COVID-19. The opportunity to ask questions was provided.

## 2023-04-26 DIAGNOSIS — H5203 Hypermetropia, bilateral: Secondary | ICD-10-CM | POA: Diagnosis not present

## 2023-04-26 DIAGNOSIS — H5213 Myopia, bilateral: Secondary | ICD-10-CM | POA: Diagnosis not present

## 2023-04-29 ENCOUNTER — Telehealth: Payer: Self-pay

## 2023-04-29 NOTE — Telephone Encounter (Signed)
Copied from CRM [id number]. Topic: Clinical - Medical Advice >> Apr 29, 2023 10:20 AM Nila Nephew wrote: Reason for CRM: Patient states he is having surgery soon 01/06 and he was instructed to stop taking clopidogrel (PLAVIX) 75 MG tablet. Pt wanted to make PCP aware and obtain okay. Patient would like nurse to give him a call back.

## 2023-04-29 NOTE — Telephone Encounter (Signed)
Patient aware and verbalizes understanding.  States they actually want him to stop the Plavix for 7 days before surgery.  Would only like a call back if this is a problem.

## 2023-04-29 NOTE — Telephone Encounter (Signed)
Patient aware and verbalized understanding. °

## 2023-04-29 NOTE — Telephone Encounter (Signed)
That is fine, go ahead and have him stop it 7 days before.

## 2023-04-29 NOTE — Telephone Encounter (Signed)
Yes let him know that he is okay to stop the Plavix 5 days before the surgery

## 2023-04-29 NOTE — Anesthesia Preprocedure Evaluation (Signed)
Anesthesia Evaluation    Airway        Dental   Pulmonary former smoker          Cardiovascular hypertension,      Neuro/Psych    GI/Hepatic   Endo/Other    Renal/GU      Musculoskeletal   Abdominal   Peds  Hematology   Anesthesia Other Findings   Reproductive/Obstetrics                              Anesthesia Physical Anesthesia Plan  ASA:   Anesthesia Plan:    Post-op Pain Management:    Induction:   PONV Risk Score and Plan:   Airway Management Planned:   Additional Equipment:   Intra-op Plan:   Post-operative Plan:   Informed Consent:   Plan Discussed with:   Anesthesia Plan Comments: (PAT note by Antionette Poles, PA-C: 77 year old male follows with cardiology for history of CVA 2021 with subsequent ILR showing no A-fib but with PACs and PVCs.  Echocardiogram 2021 was normal.  Remotely, he had a normal cath in 2002.  He was seen by Bernadene Person, NP on 04/16/2023 for preop evaluation.  Per note, "According to the Revised Cardiac Risk Index (RCRI), his Perioperative Risk of Major Cardiac Event is (%): 0.9. His Functional Capacity in METs is: 5.62 according to the Duke Activity Status Index (DASI). Therefore, based on ACC/AHA guidelines, patient would be at acceptable risk for the planned procedure without further cardiovascular testing."   Cleared by PCP Dr. Louanne Skye to hold Plavix 5 days prior to surgery.   Follows with pulmonology for history of former smoker with associated COPD and chronic hypoxemic respiratory failure (3 L O2 via [place] nightly, as needed during the day), BOOP, GERD.  Seen by pulmonologist Dr. Francine Graven 03/06/2023 for preop evaluation.  Per note, "His CT Chest scan and PFTs are overall stable since 2021. No significant bronchodilator response. He does not have significant elevation in eosinophils or IgE. We discussed that he would not likely benefit from trial of  biologic at this time. He is to continue symbicort twice daily. We have provided him with samples of brezri today per his request. He is to continue his physical activity regimen in order to stay in shape for his upcoming revision back surgery. In regards to his surgical planning, based on ARISCAT index he is intermediate risk, 13.3% risk of in-hospital post-op pulmonary complications (composite including respiratory failure, respiratory infection, pleural effusion, atelectasis, pneumothorax, bronchospasm, aspiration pneumonitis). Recommend he be extubated to bipap/cpap in the PACU for a period of time and maintained on budesonide/brovana/yupelri nebs while in the hospital."  Other pertinent history includes postoperative nausea and vomiting, GERD, hiatal hernia, hypothyroidism, CKD.   Preop labs reviewed, creatinine elevated to 2.37 (stable, consistent with history of CKD), otherwise unremarkable.  EKG 08/01/2022: Sinus Rhythm -occasional PAC.  Rate 81.-Nonspecific ST depression -Nondiagnostic.   CT chest high-resolution 03/19/2022: IMPRESSION: 1. Scattered areas of minimal subpleural ground-glass, right greater than left, nonspecific. Findings may reflect scarring. Difficult to exclude interstitial lung disease such as nonspecific interstitial pneumonitis. Findings are indeterminate for UIP per consensus guidelines: Diagnosis of Idiopathic Pulmonary Fibrosis: An Official ATS/ERS/JRS/ALAT Clinical Practice Guideline. Am Rosezetta Schlatter Crit Care Med Vol 198, Iss 5, [phone], Dec 29 2016. 2. Aortic atherosclerosis (ICD10-I70.0). Coronary artery calcification. 3.  Emphysema (ICD10-J43.9).   TTE 11/04/2019:  1. Limited echo no para sternal windows.   2. Left ventricular  ejection fraction, by estimation, is 60 to 65%. The  left ventricle has normal function. The left ventricle has no regional  wall motion abnormalities. Left ventricular diastolic parameters are  indeterminate.   3. Right ventricular  systolic function is normal. The right ventricular  size is normal.   4. The mitral valve is normal in structure. not seen well mitral valve  regurgitation. No evidence of mitral stenosis.   5. Tricuspid valve regurgitation not seen well.   6. The aortic valve was not well visualized. Aortic valve regurgitation  not seen well. not seen well.   7. The inferior vena cava is normal in size with greater than 50%  respiratory variability, suggesting right atrial pressure of 3 mmHg.   )         Anesthesia Quick Evaluation

## 2023-04-29 NOTE — Progress Notes (Signed)
Anesthesia Chart Review:  78 year old male follows with cardiology for history of CVA 2021 with subsequent ILR showing no A-fib but with PACs and PVCs.  Echocardiogram 2021 was normal.  Remotely, he had a normal cath in 2002.  He was seen by Bernadene Person, NP on 04/16/2023 for preop evaluation.  Per note, "According to the Revised Cardiac Risk Index (RCRI), his Perioperative Risk of Major Cardiac Event is (%): 0.9. His Functional Capacity in METs is: 5.62 according to the Duke Activity Status Index (DASI). Therefore, based on ACC/AHA guidelines, patient would be at acceptable risk for the planned procedure without further cardiovascular testing."   Cleared by PCP Dr. Louanne Skye to hold Plavix 5 days prior to surgery.   Follows with pulmonology for history of former smoker with associated COPD and chronic hypoxemic respiratory failure (3 L O2 via [place] nightly, as needed during the day), BOOP, GERD.  Seen by pulmonologist Dr. Francine Graven 03/06/2023 for preop evaluation.  Per note, "His CT Chest scan and PFTs are overall stable since 2021. No significant bronchodilator response. He does not have significant elevation in eosinophils or IgE. We discussed that he would not likely benefit from trial of biologic at this time. He is to continue symbicort twice daily. We have provided him with samples of brezri today per his request. He is to continue his physical activity regimen in order to stay in shape for his upcoming revision back surgery. In regards to his surgical planning, based on ARISCAT index he is intermediate risk, 13.3% risk of in-hospital post-op pulmonary complications (composite including respiratory failure, respiratory infection, pleural effusion, atelectasis, pneumothorax, bronchospasm, aspiration pneumonitis). Recommend he be extubated to bipap/cpap in the PACU for a period of time and maintained on budesonide/brovana/yupelri nebs while in the hospital."  Other pertinent history includes postoperative  nausea and vomiting, GERD, hiatal hernia, hypothyroidism, CKD.   Preop labs reviewed, creatinine elevated to 2.37 (stable, consistent with history of CKD), otherwise unremarkable.  EKG 08/01/2022: Sinus Rhythm -occasional PAC.  Rate 81.-Nonspecific ST depression -Nondiagnostic.   CT chest high-resolution 03/19/2022: IMPRESSION: 1. Scattered areas of minimal subpleural ground-glass, right greater than left, nonspecific. Findings may reflect scarring. Difficult to exclude interstitial lung disease such as nonspecific interstitial pneumonitis. Findings are indeterminate for UIP per consensus guidelines: Diagnosis of Idiopathic Pulmonary Fibrosis: An Official ATS/ERS/JRS/ALAT Clinical Practice Guideline. Am Rosezetta Schlatter Crit Care Med Vol 198, Iss 5, [phone], Dec 29 2016. 2. Aortic atherosclerosis (ICD10-I70.0). Coronary artery calcification. 3.  Emphysema (ICD10-J43.9).   TTE 11/04/2019:  1. Limited echo no para sternal windows.   2. Left ventricular ejection fraction, by estimation, is 60 to 65%. The  left ventricle has normal function. The left ventricle has no regional  wall motion abnormalities. Left ventricular diastolic parameters are  indeterminate.   3. Right ventricular systolic function is normal. The right ventricular  size is normal.   4. The mitral valve is normal in structure. not seen well mitral valve  regurgitation. No evidence of mitral stenosis.   5. Tricuspid valve regurgitation not seen well.   6. The aortic valve was not well visualized. Aortic valve regurgitation  not seen well. not seen well.   7. The inferior vena cava is normal in size with greater than 50%  respiratory variability, suggesting right atrial pressure of 3 mmHg.       Zannie Cove [facility]/Anesthesiology Phone [phone] 04/29/2023 1:18 PM

## 2023-05-03 NOTE — Progress Notes (Signed)
 Pt aware of new arrival time 0815.

## 2023-05-06 ENCOUNTER — Other Ambulatory Visit: Payer: Self-pay

## 2023-05-06 ENCOUNTER — Observation Stay (HOSPITAL_COMMUNITY): Payer: Medicare HMO

## 2023-05-06 ENCOUNTER — Encounter (HOSPITAL_COMMUNITY): Payer: Self-pay | Admitting: Neurological Surgery

## 2023-05-06 ENCOUNTER — Inpatient Hospital Stay (HOSPITAL_COMMUNITY)
Admission: RE | Admit: 2023-05-06 | Discharge: 2023-05-07 | DRG: 447 | Disposition: A | Discharge status: Home / Self Care | Payer: Medicare HMO | Attending: Neurological Surgery | Admitting: Neurological Surgery

## 2023-05-06 ENCOUNTER — Ambulatory Visit: Payer: Medicare HMO

## 2023-05-06 ENCOUNTER — Inpatient Hospital Stay (HOSPITAL_COMMUNITY): Payer: Medicare HMO

## 2023-05-06 ENCOUNTER — Inpatient Hospital Stay (HOSPITAL_COMMUNITY): Payer: Medicare HMO | Admitting: Anesthesiology

## 2023-05-06 ENCOUNTER — Inpatient Hospital Stay (HOSPITAL_COMMUNITY): Payer: Medicare HMO | Admitting: Vascular Surgery

## 2023-05-06 ENCOUNTER — Encounter (HOSPITAL_COMMUNITY)
Admission: RE | Disposition: A | Discharge status: Home / Self Care | Payer: Self-pay | Source: Home / Self Care | Attending: Neurological Surgery

## 2023-05-06 DIAGNOSIS — M960 Pseudarthrosis after fusion or arthrodesis: Principal | ICD-10-CM | POA: Diagnosis present

## 2023-05-06 DIAGNOSIS — J9622 Acute and chronic respiratory failure with hypercapnia: Secondary | ICD-10-CM | POA: Diagnosis not present

## 2023-05-06 DIAGNOSIS — Z9889 Other specified postprocedural states: Secondary | ICD-10-CM | POA: Diagnosis not present

## 2023-05-06 DIAGNOSIS — Z8261 Family history of arthritis: Secondary | ICD-10-CM | POA: Diagnosis not present

## 2023-05-06 DIAGNOSIS — Z79899 Other long term (current) drug therapy: Secondary | ICD-10-CM | POA: Diagnosis not present

## 2023-05-06 DIAGNOSIS — J9621 Acute and chronic respiratory failure with hypoxia: Secondary | ICD-10-CM | POA: Diagnosis not present

## 2023-05-06 DIAGNOSIS — Z886 Allergy status to analgesic agent status: Secondary | ICD-10-CM | POA: Diagnosis not present

## 2023-05-06 DIAGNOSIS — E039 Hypothyroidism, unspecified: Secondary | ICD-10-CM | POA: Diagnosis not present

## 2023-05-06 DIAGNOSIS — Z87442 Personal history of urinary calculi: Secondary | ICD-10-CM | POA: Diagnosis not present

## 2023-05-06 DIAGNOSIS — M5416 Radiculopathy, lumbar region: Secondary | ICD-10-CM | POA: Diagnosis not present

## 2023-05-06 DIAGNOSIS — Z981 Arthrodesis status: Principal | ICD-10-CM

## 2023-05-06 DIAGNOSIS — T84038A Mechanical loosening of other internal prosthetic joint, initial encounter: Secondary | ICD-10-CM

## 2023-05-06 DIAGNOSIS — Z7902 Long term (current) use of antithrombotics/antiplatelets: Secondary | ICD-10-CM | POA: Diagnosis not present

## 2023-05-06 DIAGNOSIS — Z823 Family history of stroke: Secondary | ICD-10-CM

## 2023-05-06 DIAGNOSIS — J449 Chronic obstructive pulmonary disease, unspecified: Secondary | ICD-10-CM | POA: Diagnosis not present

## 2023-05-06 DIAGNOSIS — G9341 Metabolic encephalopathy: Secondary | ICD-10-CM | POA: Diagnosis present

## 2023-05-06 DIAGNOSIS — N184 Chronic kidney disease, stage 4 (severe): Secondary | ICD-10-CM | POA: Diagnosis present

## 2023-05-06 DIAGNOSIS — Z4682 Encounter for fitting and adjustment of non-vascular catheter: Secondary | ICD-10-CM | POA: Diagnosis not present

## 2023-05-06 DIAGNOSIS — Z87891 Personal history of nicotine dependence: Secondary | ICD-10-CM | POA: Diagnosis not present

## 2023-05-06 DIAGNOSIS — Z880 Allergy status to penicillin: Secondary | ICD-10-CM

## 2023-05-06 DIAGNOSIS — K219 Gastro-esophageal reflux disease without esophagitis: Secondary | ICD-10-CM | POA: Diagnosis present

## 2023-05-06 DIAGNOSIS — Z885 Allergy status to narcotic agent status: Secondary | ICD-10-CM

## 2023-05-06 DIAGNOSIS — I10 Essential (primary) hypertension: Secondary | ICD-10-CM | POA: Diagnosis not present

## 2023-05-06 DIAGNOSIS — I517 Cardiomegaly: Secondary | ICD-10-CM | POA: Diagnosis not present

## 2023-05-06 DIAGNOSIS — Z781 Physical restraint status: Secondary | ICD-10-CM | POA: Diagnosis not present

## 2023-05-06 DIAGNOSIS — Z8673 Personal history of transient ischemic attack (TIA), and cerebral infarction without residual deficits: Secondary | ICD-10-CM | POA: Diagnosis not present

## 2023-05-06 DIAGNOSIS — Z96649 Presence of unspecified artificial hip joint: Secondary | ICD-10-CM | POA: Diagnosis present

## 2023-05-06 DIAGNOSIS — I129 Hypertensive chronic kidney disease with stage 1 through stage 4 chronic kidney disease, or unspecified chronic kidney disease: Secondary | ICD-10-CM | POA: Diagnosis present

## 2023-05-06 DIAGNOSIS — Z79890 Hormone replacement therapy (postmenopausal): Secondary | ICD-10-CM

## 2023-05-06 DIAGNOSIS — Z9981 Dependence on supplemental oxygen: Secondary | ICD-10-CM

## 2023-05-06 DIAGNOSIS — Z791 Long term (current) use of non-steroidal anti-inflammatories (NSAID): Secondary | ICD-10-CM

## 2023-05-06 DIAGNOSIS — I2510 Atherosclerotic heart disease of native coronary artery without angina pectoris: Secondary | ICD-10-CM | POA: Diagnosis not present

## 2023-05-06 DIAGNOSIS — R918 Other nonspecific abnormal finding of lung field: Secondary | ICD-10-CM | POA: Diagnosis not present

## 2023-05-06 HISTORY — PX: MAXIMUM ACCESS (MAS) TRANSFORAMINAL LUMBAR INTERBODY FUSION (TLIF) 1 LEVEL: SHX6392

## 2023-05-06 LAB — POCT I-STAT 7, (LYTES, BLD GAS, ICA,H+H)
Acid-base deficit: 7 mmol/L — ABNORMAL HIGH (ref 0.0–2.0)
Acid-base deficit: 5 mmol/L — ABNORMAL HIGH (ref 0.0–2.0)
Bicarbonate: 21.5 mmol/L (ref 20.0–28.0)
Bicarbonate: 21.8 mmol/L (ref 20.0–28.0)
Calcium, Ion: 1.23 mmol/L (ref 1.15–1.40)
Calcium, Ion: 1.29 mmol/L (ref 1.15–1.40)
HCT: 42 % (ref 39.0–52.0)
HCT: 40 % (ref 39.0–52.0)
Hemoglobin: 14.3 g/dL (ref 13.0–17.0)
Hemoglobin: 13.6 g/dL (ref 13.0–17.0)
O2 Saturation: 96 %
O2 Saturation: 92 %
Patient temperature: 96.8
Potassium: 5.1 mmol/L (ref 3.5–5.1)
Potassium: 4.6 mmol/L (ref 3.5–5.1)
Sodium: 140 mmol/L (ref 135–145)
Sodium: 139 mmol/L (ref 135–145)
TCO2: 23 mmol/L (ref 22–32)
TCO2: 23 mmol/L (ref 22–32)
pCO2 arterial: 56.7 mm[Hg] — ABNORMAL HIGH (ref 32–48)
pCO2 arterial: 45.9 mm[Hg] (ref 32–48)
pH, Arterial: 7.187 — CL (ref 7.35–7.45)
pH, Arterial: 7.28 — ABNORMAL LOW (ref 7.35–7.45)
pO2, Arterial: 105 mm[Hg] (ref 83–108)
pO2, Arterial: 70 mm[Hg] — ABNORMAL LOW (ref 83–108)

## 2023-05-06 LAB — BASIC METABOLIC PANEL
Anion gap: 9 (ref 5–15)
BUN: 24 mg/dL — ABNORMAL HIGH (ref 8–23)
CO2: 21 mmol/L — ABNORMAL LOW (ref 22–32)
Calcium: 8.5 mg/dL — ABNORMAL LOW (ref 8.9–10.3)
Chloride: 109 mmol/L (ref 98–111)
Creatinine, Ser: 2.23 mg/dL — ABNORMAL HIGH (ref 0.61–1.24)
GFR, Estimated: 30 mL/min — ABNORMAL LOW (ref >60)
Glucose, Bld: 159 mg/dL — ABNORMAL HIGH (ref 70–99)
Potassium: 4.7 mmol/L (ref 3.5–5.1)
Sodium: 139 mmol/L (ref 135–145)

## 2023-05-06 LAB — CBC
HCT: 42.2 % (ref 39.0–52.0)
Hemoglobin: 14 g/dL (ref 13.0–17.0)
MCH: 31.1 pg (ref 26.0–34.0)
MCHC: 33.2 g/dL (ref 30.0–36.0)
MCV: 93.8 fL (ref 80.0–100.0)
Platelets: 155 10*3/uL (ref 150–400)
RBC: 4.5 MIL/uL (ref 4.22–5.81)
RDW: 12.9 % (ref 11.5–15.5)
WBC: 11.9 10*3/uL — ABNORMAL HIGH (ref 4.0–10.5)
nRBC: 0.2 % (ref 0.0–0.2)

## 2023-05-06 LAB — MRSA NEXT GEN BY PCR, NASAL: MRSA by PCR Next Gen: NOT DETECTED

## 2023-05-06 LAB — MAGNESIUM: Magnesium: 1.7 mg/dL (ref 1.7–2.4)

## 2023-05-06 LAB — GLUCOSE, CAPILLARY
Glucose-Capillary: 159 mg/dL — ABNORMAL HIGH (ref 70–99)
Glucose-Capillary: 159 mg/dL — ABNORMAL HIGH (ref 70–99)

## 2023-05-06 SURGERY — MAXIMUM ACCESS (MAS) TRANSFORAMINAL LUMBAR INTERBODY FUSION (TLIF) 1 LEVEL
Anesthesia: General | Site: Back | Laterality: Left

## 2023-05-06 MED ORDER — ONDANSETRON HCL 4 MG PO TABS: 4.0000 mg | ORAL_TABLET | Freq: Four times a day (QID) | ORAL | Status: DC | PRN

## 2023-05-06 MED ORDER — VANCOMYCIN HCL IN DEXTROSE 1-5 GM/200ML-% IV SOLN
1000.0000 mg | INTRAVENOUS | Status: AC
  Administered 2023-05-06: 1000 mg via INTRAVENOUS
  Filled 2023-05-06: qty 200

## 2023-05-06 MED ORDER — OXYCODONE HCL 5 MG PO TABS
5.0000 mg | ORAL_TABLET | ORAL | Status: DC | PRN
Start: 2023-05-06 — End: 2023-05-07

## 2023-05-06 MED ORDER — REVEFENACIN 175 MCG/3ML IN SOLN
175.0000 ug | Freq: Every day | RESPIRATORY_TRACT | Status: DC
  Administered 2023-05-07: 175 ug via RESPIRATORY_TRACT
  Filled 2023-05-06: qty 3

## 2023-05-06 MED ORDER — GABAPENTIN 300 MG PO CAPS: 300.0000 mg | ORAL_CAPSULE | Freq: Two times a day (BID) | ORAL | Status: DC

## 2023-05-06 MED ORDER — ORAL CARE MOUTH RINSE
15.0000 mL | OROMUCOSAL | Status: DC
  Administered 2023-05-06 – 2023-05-07 (×10): 15 mL via OROMUCOSAL

## 2023-05-06 MED ORDER — MUPIROCIN 2 % EX OINT: 1.0000 | TOPICAL_OINTMENT | Freq: Two times a day (BID) | CUTANEOUS | 0 refills | Status: AC

## 2023-05-06 MED ORDER — ORAL CARE MOUTH RINSE: 15.0000 mL | OROMUCOSAL | Status: DC | PRN

## 2023-05-06 MED ORDER — MENTHOL 3 MG MT LOZG: 1.0000 | LOZENGE | OROMUCOSAL | Status: DC | PRN

## 2023-05-06 MED ORDER — CHLORHEXIDINE GLUCONATE 4 % EX SOLN: 1.0000 | CUTANEOUS | 1 refills | Status: DC

## 2023-05-06 MED ORDER — AMISULPRIDE (ANTIEMETIC) 5 MG/2ML IV SOLN
INTRAVENOUS | Status: AC
  Filled 2023-05-06: qty 4

## 2023-05-06 MED ORDER — MIDAZOLAM HCL 2 MG/2ML IJ SOLN
0.5000 mg | Freq: Once | INTRAMUSCULAR | Status: DC | PRN
Start: 2023-05-06 — End: 2023-05-06

## 2023-05-06 MED ORDER — THROMBIN 5000 UNITS EX SOLR
OROMUCOSAL | Status: DC | PRN
  Administered 2023-05-06: 5 mL via TOPICAL

## 2023-05-06 MED ORDER — 0.9 % SODIUM CHLORIDE (POUR BTL) OPTIME
TOPICAL | Status: DC | PRN
  Administered 2023-05-06: 1000 mL

## 2023-05-06 MED ORDER — SODIUM BICARBONATE 8.4 % IV SOLN
INTRAVENOUS | Status: AC
  Filled 2023-05-06: qty 100

## 2023-05-06 MED ORDER — THROMBIN 5000 UNITS EX SOLR
CUTANEOUS | Status: AC
  Filled 2023-05-06: qty 5000

## 2023-05-06 MED ORDER — PROPOFOL 10 MG/ML IV BOLUS
INTRAVENOUS | Status: AC
  Filled 2023-05-06: qty 20

## 2023-05-06 MED ORDER — CHLORHEXIDINE GLUCONATE 0.12 % MT SOLN
15.0000 mL | Freq: Once | OROMUCOSAL | Status: AC
  Administered 2023-05-06: 15 mL via OROMUCOSAL
  Filled 2023-05-06: qty 15

## 2023-05-06 MED ORDER — KCL IN DEXTROSE-NACL 10-5-0.45 MEQ/L-%-% IV SOLN: INTRAVENOUS | Status: DC

## 2023-05-06 MED ORDER — MOMETASONE FURO-FORMOTEROL FUM 200-5 MCG/ACT IN AERO: 2.0000 | INHALATION_SPRAY | Freq: Two times a day (BID) | RESPIRATORY_TRACT | Status: DC

## 2023-05-06 MED ORDER — BUPIVACAINE HCL (PF) 0.25 % IJ SOLN
INTRAMUSCULAR | Status: DC | PRN
  Administered 2023-05-06: 10 mL
  Administered 2023-05-06: 8 mL

## 2023-05-06 MED ORDER — DEXMEDETOMIDINE HCL IN NACL 400 MCG/100ML IV SOLN
0.4000 ug/kg/h | INTRAVENOUS | Status: DC
  Administered 2023-05-06: 0.4 ug/kg/h via INTRAVENOUS
  Administered 2023-05-06 – 2023-05-07 (×2): 0.6 ug/kg/h via INTRAVENOUS
  Filled 2023-05-06: qty 100
  Filled 2023-05-06: qty 50
  Filled 2023-05-06: qty 100
  Filled 2023-05-06: qty 50

## 2023-05-06 MED ORDER — MORPHINE SULFATE (PF) 2 MG/ML IV SOLN: 2.0000 mg | INTRAVENOUS | Status: DC | PRN

## 2023-05-06 MED ORDER — CHLORHEXIDINE GLUCONATE CLOTH 2 % EX PADS: 6.0000 | MEDICATED_PAD | Freq: Once | CUTANEOUS | Status: DC

## 2023-05-06 MED ORDER — ALBUTEROL SULFATE HFA 108 (90 BASE) MCG/ACT IN AERS: 1.0000 | INHALATION_SPRAY | RESPIRATORY_TRACT | Status: DC | PRN

## 2023-05-06 MED ORDER — PHENOL 1.4 % MT LIQD: 1.0000 | OROMUCOSAL | Status: DC | PRN

## 2023-05-06 MED ORDER — LOSARTAN POTASSIUM-HCTZ 50-12.5 MG PO TABS: 1.0000 | ORAL_TABLET | Freq: Every day | ORAL | Status: DC

## 2023-05-06 MED ORDER — HYDROMORPHONE HCL 1 MG/ML IJ SOLN: 0.2500 mg | INTRAMUSCULAR | Status: DC | PRN

## 2023-05-06 MED ORDER — LIDOCAINE 2% (20 MG/ML) 5 ML SYRINGE
INTRAMUSCULAR | Status: DC | PRN
  Administered 2023-05-06: 25 mg via INTRAVENOUS

## 2023-05-06 MED ORDER — FENTANYL CITRATE PF 50 MCG/ML IJ SOSY
25.0000 ug | PREFILLED_SYRINGE | INTRAMUSCULAR | Status: DC | PRN
  Administered 2023-05-06: 50 ug via INTRAVENOUS
  Filled 2023-05-06: qty 1

## 2023-05-06 MED ORDER — MEPERIDINE HCL 25 MG/ML IJ SOLN
6.2500 mg | INTRAMUSCULAR | Status: DC | PRN
Start: 2023-05-06 — End: 2023-05-06

## 2023-05-06 MED ORDER — SODIUM CHLORIDE 0.9 % IV SOLN: 250.0000 mL | INTRAVENOUS | Status: DC

## 2023-05-06 MED ORDER — PHENYLEPHRINE HCL-NACL 20-0.9 MG/250ML-% IV SOLN
INTRAVENOUS | Status: DC | PRN
  Administered 2023-05-06: 120 ug via INTRAVENOUS
  Administered 2023-05-06: 160 ug via INTRAVENOUS
  Administered 2023-05-06: 80 ug via INTRAVENOUS

## 2023-05-06 MED ORDER — ORAL CARE MOUTH RINSE
15.0000 mL | OROMUCOSAL | Status: DC
  Administered 2023-05-06: 15 mL via OROMUCOSAL

## 2023-05-06 MED ORDER — METHOCARBAMOL 500 MG PO TABS: 500.0000 mg | ORAL_TABLET | Freq: Four times a day (QID) | ORAL | Status: DC | PRN

## 2023-05-06 MED ORDER — ACETAMINOPHEN 160 MG/5ML PO SOLN
1000.0000 mg | Freq: Four times a day (QID) | ORAL | Status: DC
  Filled 2023-05-06: qty 40.6

## 2023-05-06 MED ORDER — SUCCINYLCHOLINE CHLORIDE 200 MG/10ML IV SOSY
PREFILLED_SYRINGE | INTRAVENOUS | Status: DC | PRN
  Administered 2023-05-06: 100 mg via INTRAVENOUS

## 2023-05-06 MED ORDER — ACETAMINOPHEN 10 MG/ML IV SOLN
INTRAVENOUS | Status: AC
  Filled 2023-05-06: qty 100

## 2023-05-06 MED ORDER — PREDNISOLONE ACETATE 1 % OP SUSP
1.0000 [drp] | Freq: Every day | OPHTHALMIC | Status: AC
  Administered 2023-05-07: 1 [drp] via OPHTHALMIC
  Filled 2023-05-06: qty 5

## 2023-05-06 MED ORDER — PANTOPRAZOLE SODIUM 40 MG IV SOLR
40.0000 mg | Freq: Every day | INTRAVENOUS | Status: DC
  Administered 2023-05-06: 40 mg via INTRAVENOUS
  Filled 2023-05-06: qty 10

## 2023-05-06 MED ORDER — DEXMEDETOMIDINE HCL IN NACL 200 MCG/50ML IV SOLN: 0.4000 ug/kg/h | INTRAVENOUS | Status: DC

## 2023-05-06 MED ORDER — DEXMEDETOMIDINE HCL IN NACL 80 MCG/20ML IV SOLN
INTRAVENOUS | Status: DC | PRN
  Administered 2023-05-06: 12 ug via INTRAVENOUS

## 2023-05-06 MED ORDER — ARFORMOTEROL TARTRATE 15 MCG/2ML IN NEBU
15.0000 ug | INHALATION_SOLUTION | Freq: Two times a day (BID) | RESPIRATORY_TRACT | Status: DC
  Administered 2023-05-06 – 2023-05-07 (×2): 15 ug via RESPIRATORY_TRACT
  Filled 2023-05-06 (×2): qty 2

## 2023-05-06 MED ORDER — ONDANSETRON HCL 4 MG/2ML IJ SOLN
INTRAMUSCULAR | Status: DC | PRN
  Administered 2023-05-06: 4 mg via INTRAVENOUS

## 2023-05-06 MED ORDER — OXYCODONE HCL 5 MG PO TABS: 5.0000 mg | ORAL_TABLET | Freq: Once | ORAL | Status: DC | PRN

## 2023-05-06 MED ORDER — ORAL CARE MOUTH RINSE: 15.0000 mL | Freq: Once | OROMUCOSAL | Status: AC

## 2023-05-06 MED ORDER — BUDESONIDE 0.5 MG/2ML IN SUSP
0.5000 mg | Freq: Two times a day (BID) | RESPIRATORY_TRACT | Status: DC
  Administered 2023-05-06 – 2023-05-07 (×2): 0.5 mg via RESPIRATORY_TRACT
  Filled 2023-05-06 (×2): qty 2

## 2023-05-06 MED ORDER — SUGAMMADEX SODIUM 200 MG/2ML IV SOLN
INTRAVENOUS | Status: DC | PRN
  Administered 2023-05-06: 184 mg via INTRAVENOUS

## 2023-05-06 MED ORDER — SODIUM CHLORIDE 0.9% FLUSH
3.0000 mL | Freq: Two times a day (BID) | INTRAVENOUS | Status: DC
  Administered 2023-05-06 – 2023-05-07 (×2): 3 mL via INTRAVENOUS

## 2023-05-06 MED ORDER — POLYETHYLENE GLYCOL 3350 17 G PO PACK: 17.0000 g | PACK | Freq: Every day | ORAL | Status: DC

## 2023-05-06 MED ORDER — FENTANYL CITRATE (PF) 250 MCG/5ML IJ SOLN
INTRAMUSCULAR | Status: AC
  Filled 2023-05-06: qty 5

## 2023-05-06 MED ORDER — FENTANYL CITRATE PF 50 MCG/ML IJ SOSY: 25.0000 ug | PREFILLED_SYRINGE | INTRAMUSCULAR | Status: DC | PRN

## 2023-05-06 MED ORDER — PROPOFOL 10 MG/ML IV BOLUS
INTRAVENOUS | Status: DC | PRN
  Administered 2023-05-06: 80 mg via INTRAVENOUS
  Administered 2023-05-06: 150 mg via INTRAVENOUS

## 2023-05-06 MED ORDER — ONDANSETRON HCL 4 MG/2ML IJ SOLN: 4.0000 mg | Freq: Four times a day (QID) | INTRAMUSCULAR | Status: DC | PRN

## 2023-05-06 MED ORDER — ALBUTEROL SULFATE (2.5 MG/3ML) 0.083% IN NEBU: 2.5000 mg | INHALATION_SOLUTION | RESPIRATORY_TRACT | Status: DC | PRN

## 2023-05-06 MED ORDER — ALBUTEROL SULFATE HFA 108 (90 BASE) MCG/ACT IN AERS
INHALATION_SPRAY | RESPIRATORY_TRACT | Status: DC | PRN
  Administered 2023-05-06 (×2): 2 via RESPIRATORY_TRACT

## 2023-05-06 MED ORDER — DEXMEDETOMIDINE HCL IN NACL 80 MCG/20ML IV SOLN
INTRAVENOUS | Status: AC
  Filled 2023-05-06: qty 20

## 2023-05-06 MED ORDER — SODIUM CHLORIDE 0.9% FLUSH: 3.0000 mL | INTRAVENOUS | Status: DC | PRN

## 2023-05-06 MED ORDER — IPRATROPIUM-ALBUTEROL 0.5-2.5 (3) MG/3ML IN SOLN
3.0000 mL | Freq: Once | RESPIRATORY_TRACT | Status: AC
  Administered 2023-05-06: 3 mL via RESPIRATORY_TRACT

## 2023-05-06 MED ORDER — LACTATED RINGERS IV BOLUS
500.0000 mL | Freq: Once | INTRAVENOUS | Status: AC
  Administered 2023-05-06: 500 mL via INTRAVENOUS

## 2023-05-06 MED ORDER — METHOCARBAMOL 1000 MG/10ML IJ SOLN: 500.0000 mg | Freq: Four times a day (QID) | INTRAMUSCULAR | Status: DC | PRN

## 2023-05-06 MED ORDER — LABETALOL HCL 5 MG/ML IV SOLN
INTRAVENOUS | Status: DC | PRN
  Administered 2023-05-06 (×2): 10 mg via INTRAVENOUS

## 2023-05-06 MED ORDER — THROMBIN 20000 UNITS EX SOLR
CUTANEOUS | Status: AC
  Filled 2023-05-06: qty 20000

## 2023-05-06 MED ORDER — CHLORHEXIDINE GLUCONATE CLOTH 2 % EX PADS
6.0000 | MEDICATED_PAD | Freq: Every day | CUTANEOUS | Status: DC
  Administered 2023-05-06 – 2023-05-07 (×2): 6 via TOPICAL

## 2023-05-06 MED ORDER — DOCUSATE SODIUM 50 MG/5ML PO LIQD: 100.0000 mg | Freq: Two times a day (BID) | ORAL | Status: DC

## 2023-05-06 MED ORDER — ACETAMINOPHEN 10 MG/ML IV SOLN
INTRAVENOUS | Status: DC | PRN
  Administered 2023-05-06: 1000 mg via INTRAVENOUS

## 2023-05-06 MED ORDER — LACTATED RINGERS IV SOLN: INTRAVENOUS | Status: DC

## 2023-05-06 MED ORDER — AMISULPRIDE (ANTIEMETIC) 5 MG/2ML IV SOLN
10.0000 mg | Freq: Once | INTRAVENOUS | Status: AC
  Administered 2023-05-06: 10 mg via INTRAVENOUS

## 2023-05-06 MED ORDER — HYDROMORPHONE HCL 1 MG/ML IJ SOLN
INTRAMUSCULAR | Status: AC
  Filled 2023-05-06: qty 0.5

## 2023-05-06 MED ORDER — ACETAMINOPHEN 500 MG PO TABS
1000.0000 mg | ORAL_TABLET | Freq: Once | ORAL | Status: DC
Start: 2023-05-06 — End: 2023-05-06

## 2023-05-06 MED ORDER — CEFAZOLIN SODIUM-DEXTROSE 2-4 GM/100ML-% IV SOLN
2.0000 g | Freq: Three times a day (TID) | INTRAVENOUS | Status: AC
  Administered 2023-05-06 – 2023-05-07 (×2): 2 g via INTRAVENOUS
  Filled 2023-05-06 (×2): qty 100

## 2023-05-06 MED ORDER — THROMBIN 20000 UNITS EX SOLR
CUTANEOUS | Status: DC | PRN
  Administered 2023-05-06: 20 mL via TOPICAL

## 2023-05-06 MED ORDER — SENNA 8.6 MG PO TABS: 1.0000 | ORAL_TABLET | Freq: Two times a day (BID) | ORAL | Status: DC

## 2023-05-06 MED ORDER — IPRATROPIUM-ALBUTEROL 0.5-2.5 (3) MG/3ML IN SOLN
RESPIRATORY_TRACT | Status: AC
  Filled 2023-05-06: qty 3

## 2023-05-06 MED ORDER — LEVOTHYROXINE SODIUM 25 MCG PO TABS: 137.0000 ug | ORAL_TABLET | Freq: Every day | ORAL | Status: DC

## 2023-05-06 MED ORDER — DEXAMETHASONE SODIUM PHOSPHATE 10 MG/ML IJ SOLN
INTRAMUSCULAR | Status: DC | PRN
  Administered 2023-05-06: 10 mg via INTRAVENOUS

## 2023-05-06 MED ORDER — MAGNESIUM OXIDE 400 MG PO TABS: 400.0000 mg | ORAL_TABLET | Freq: Every day | ORAL | Status: DC

## 2023-05-06 MED ORDER — ROCURONIUM BROMIDE 10 MG/ML (PF) SYRINGE
PREFILLED_SYRINGE | INTRAVENOUS | Status: DC | PRN
  Administered 2023-05-06: 20 mg via INTRAVENOUS
  Administered 2023-05-06: 60 mg via INTRAVENOUS

## 2023-05-06 MED ORDER — FENTANYL CITRATE (PF) 250 MCG/5ML IJ SOLN
INTRAMUSCULAR | Status: DC | PRN
  Administered 2023-05-06: 50 ug via INTRAVENOUS
  Administered 2023-05-06: 150 ug via INTRAVENOUS
  Administered 2023-05-06: 50 ug via INTRAVENOUS

## 2023-05-06 MED ORDER — IPRATROPIUM-ALBUTEROL 0.5-2.5 (3) MG/3ML IN SOLN: 3.0000 mL | RESPIRATORY_TRACT | Status: DC | PRN

## 2023-05-06 MED ORDER — OXYCODONE HCL 5 MG/5ML PO SOLN: 5.0000 mg | Freq: Once | ORAL | Status: DC | PRN

## 2023-05-06 MED ORDER — SODIUM BICARBONATE 8.4 % IV SOLN
100.0000 meq | Freq: Once | INTRAVENOUS | Status: AC
  Administered 2023-05-06: 100 meq via INTRAVENOUS

## 2023-05-06 MED ORDER — HYDROMORPHONE HCL 1 MG/ML IJ SOLN
INTRAMUSCULAR | Status: DC | PRN
  Administered 2023-05-06 (×2): .5 mg via INTRAVENOUS

## 2023-05-06 MED ORDER — ACETAMINOPHEN 500 MG PO TABS: 1000.0000 mg | ORAL_TABLET | Freq: Four times a day (QID) | ORAL | Status: DC

## 2023-05-06 MED ORDER — SODIUM CHLORIDE 0.9 % IV SOLN: INTRAVENOUS | Status: DC | PRN

## 2023-05-06 MED ORDER — BUPIVACAINE HCL (PF) 0.25 % IJ SOLN
INTRAMUSCULAR | Status: AC
  Filled 2023-05-06: qty 30

## 2023-05-06 SURGICAL SUPPLY — 56 items
ALLOGRAFT BONE FIBER KORE 5 (Bone Implant) IMPLANT
BAG COUNTER SPONGE SURGICOUNT (BAG) ×1 IMPLANT
BASKET BONE COLLECTION (BASKET) ×1 IMPLANT
BENZOIN TINCTURE PRP APPL 2/3 (GAUZE/BANDAGES/DRESSINGS) ×1 IMPLANT
BLADE BONE MILL MEDIUM (MISCELLANEOUS) IMPLANT
BLADE CLIPPER SURG (BLADE) IMPLANT
BUR CARBIDE MATCH 3.0 (BURR) ×1 IMPLANT
CANISTER SUCT 3000ML PPV (MISCELLANEOUS) ×1 IMPLANT
CNTNR URN SCR LID CUP LEK RST (MISCELLANEOUS) ×1 IMPLANT
COVER BACK TABLE 24X17X13 BIG (DRAPES) IMPLANT
COVER BACK TABLE 60X90IN (DRAPES) ×1 IMPLANT
DERMABOND ADVANCED .7 DNX12 (GAUZE/BANDAGES/DRESSINGS) IMPLANT
DRAPE C-ARM 42X72 X-RAY (DRAPES) ×1 IMPLANT
DRAPE C-ARMOR (DRAPES) ×1 IMPLANT
DRAPE LAPAROTOMY 100X72X124 (DRAPES) ×1 IMPLANT
DRAPE SURG 17X23 STRL (DRAPES) ×1 IMPLANT
DRSG OPSITE POSTOP 4X6 (GAUZE/BANDAGES/DRESSINGS) IMPLANT
DURAPREP 26ML APPLICATOR (WOUND CARE) ×1 IMPLANT
ELECT REM PT RETURN 9FT ADLT (ELECTROSURGICAL) ×1
ELECTRODE REM PT RTRN 9FT ADLT (ELECTROSURGICAL) ×1 IMPLANT
EVACUATOR 1/8 PVC DRAIN (DRAIN) ×1 IMPLANT
GAUZE 4X4 16PLY ~~LOC~~+RFID DBL (SPONGE) IMPLANT
GLOVE BIO SURGEON STRL SZ7 (GLOVE) IMPLANT
GLOVE BIO SURGEON STRL SZ8 (GLOVE) ×2 IMPLANT
GLOVE BIOGEL PI IND STRL 7.0 (GLOVE) IMPLANT
GLOVE SURG SS PI 7.0 STRL IVOR (GLOVE) IMPLANT
GLOVE SURG SS PI 8.0 STRL IVOR (GLOVE) IMPLANT
GOWN STRL REUS W/ TWL LRG LVL3 (GOWN DISPOSABLE) IMPLANT
GOWN STRL REUS W/ TWL XL LVL3 (GOWN DISPOSABLE) ×2 IMPLANT
GOWN STRL REUS W/TWL 2XL LVL3 (GOWN DISPOSABLE) IMPLANT
GRAFT BN 5X1XSPNE CVD POST DBM (Bone Implant) IMPLANT
HEMOSTAT POWDER KIT SURGIFOAM (HEMOSTASIS) IMPLANT
KIT BASIN OR (CUSTOM PROCEDURE TRAY) ×1 IMPLANT
KIT INFUSE SMALL (Orthopedic Implant) IMPLANT
KIT TURNOVER KIT B (KITS) ×1 IMPLANT
NDL HYPO 25X1 1.5 SAFETY (NEEDLE) ×1 IMPLANT
NEEDLE HYPO 25X1 1.5 SAFETY (NEEDLE) ×1 IMPLANT
NS IRRIG 1000ML POUR BTL (IV SOLUTION) ×1 IMPLANT
PACK LAMINECTOMY NEURO (CUSTOM PROCEDURE TRAY) ×1 IMPLANT
PAD ARMBOARD 7.5X6 YLW CONV (MISCELLANEOUS) ×3 IMPLANT
ROD LORD TI 5.5X35 (Rod) IMPLANT
ROD LORDOTIC TI 5.5X40 KODIAK (Rod) IMPLANT
SCREW CORT PA 7.5X45 (Screw) IMPLANT
SET SCREW SPNE (Screw) IMPLANT
SPACER IDENTITI 13X9X25 10D (Spacer) IMPLANT
SPONGE SURGIFOAM ABS GEL 100 (HEMOSTASIS) ×1 IMPLANT
SPONGE T-LAP 4X18 ~~LOC~~+RFID (SPONGE) IMPLANT
STRIP CLOSURE SKIN 1/2X4 (GAUZE/BANDAGES/DRESSINGS) ×2 IMPLANT
SUT VIC AB 0 CT1 18XCR BRD8 (SUTURE) ×1 IMPLANT
SUT VIC AB 2-0 CP2 18 (SUTURE) ×1 IMPLANT
SUT VIC AB 3-0 SH 8-18 (SUTURE) ×2 IMPLANT
SYR 3ML LL SCALE MARK (SYRINGE) IMPLANT
TOWEL GREEN STERILE (TOWEL DISPOSABLE) ×1 IMPLANT
TOWEL GREEN STERILE FF (TOWEL DISPOSABLE) ×1 IMPLANT
TRAY FOLEY MTR SLVR 16FR STAT (SET/KITS/TRAYS/PACK) ×1 IMPLANT
WATER STERILE IRR 1000ML POUR (IV SOLUTION) ×1 IMPLANT

## 2023-05-06 NOTE — Consult Note (Addendum)
 NAME:  Antonio Bowen, MRN:  [id number], DOB:  12/17/1945, LOS: 1 ADMISSION DATE:  05/06/2023, CONSULTATION DATE:  05/05/2022 REFERRING MD:  NSGY, CHIEF COMPLAINT:  resp failure   History of Present Illness:  Pt encephalopathic, therefore HPI obtained from medical chart review.    9 yoM with PMH significant for but not limited to former smoker, COPD, O2 dependent at night 3L, BOOP, GERD, and HTN, thyroid  disease and CKD.  Pt underwent elective decompressive lumbar laminectomy with fusion by Dr. Joshua for worsening back pain and radiculopathy secondary to pseudoarthritis of L4-5 with loosening of instrumentation.  Extubated but developed worsening agitation, hypoxia, and respiratory distress in PACU requiring BVM and emergent reintubation with ABG 7.187/ 56/ 105/ 21 after re-intubation.  PCCM consulted for further medical and vent management.   Pertinent  Medical History   Past Medical History:  Diagnosis Date   Allergic rhinitis    Arthritis    BOOP (bronchiolitis obliterans with organizing pneumonia) (HCC)    Bronchitis    Colon polyps    Complication of anesthesia    COPD (chronic obstructive pulmonary disease) (HCC)    Dyspnea    with exertion    Dysrhythmia    skipped beats   GERD (gastroesophageal reflux disease)    History of hiatal hernia    History of kidney stones    Hypertension    Hypothyroidism    Low back pain    Neuropathy 2024   right leg   Oxygen  dependent    uses 3L/[place] at nite occasional during day if needed    Pneumonia    BOOP   PONV (postoperative nausea and vomiting)    N/V with narcotics   Stroke (HCC)    hx of 2 strokes which patient did not know he had had . Weakness in left arm   Thyroid  disease    Tobacco dependence    Significant Hospital Events: Including procedures, antibiotic start and stop dates in addition to other pertinent events   1/6 lumbar laminectomy, reintubated in PACU  Interim History / Subjective:   Objective   Blood pressure  (!) 143/103, pulse 82, temperature (!) 97.4 F (36.3 C), resp. rate 15, height 5' 9 (1.753 m), weight 92 kg, SpO2 (!) 88%.        Intake/Output Summary (Last 24 hours) at 05/06/2023 1607 Last data filed at 05/06/2023 1510 Gross per 24 hour  Intake 1000 ml  Output 200 ml  Net 800 ml   Filed Weights   05/06/23 0852  Weight: 92 kg   Examination: General:  well nourished elderly male on MV in NAD HEENT: MM pink/moist, ETT, pupils 3/r Neuro: sedated on precedex , will open eyes to verbal and follow simple commands, moves all extremities, intermittently restless CV: rr, NSR, no murmur PULM:  non labored on MV, clear, no wheeze GI:  soft, round, +bs Extremities: warm/dry, no tibial edema  Skin: no rashes  ABG 7.187/ 56/ 105/ 21> after reintubation/ BVM for several minutes prior to intubation  Resolved Hospital Problem list    Assessment & Plan:   Acute hypoxic and hypercarbic respiratory failure COPD- doubt AE, no evidence of bronchospasm Nocturnal hypoxemia on 3L  P:  - full MV support, 4-8cc/kg IBW with goal Pplat <30 and DP<15  - rate increased to 26 for resp acidosis given ABG after intubation.  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/ loosening of prior instrumentation s/p decompressive lumbar laminectomy and fusion L-4-5 - per NSGY - neuro checks - complete postop abx - multimodal pain management with bowel regimen  HTN - hold losartan / hydrochlorothiazide  today - hold MIVF   CKD - baseline sCr 2- 2.37 - BMET now and trend - strict I/Os - monitor for urinary retention   Hypothyroid - resume synthroid  per tube  Best Practice (right click  and Reselect all SmartList Selections daily)   Diet/type: NPO DVT prophylaxis SCD Pressure ulcer(s): N/A GI prophylaxis: PPI Lines: N/A Foley:  N/A Code Status:  full code Last date of multidisciplinary goals of care discussion [per primary team]  Labs   CBC: No results for input(s): WBC, NEUTROABS, HGB, HCT, MCV, PLT in the last 168 hours.  Basic Metabolic Panel: No results for input(s): NA, K, CL, CO2, GLUCOSE, BUN, CREATININE, CALCIUM , MG, PHOS in the last 168 hours. GFR: Estimated Creatinine Clearance: 29.2 mL/min (A) (by C-G formula based on SCr of 2.37 mg/dL (H)). No results for input(s): PROCALCITON, WBC, LATICACIDVEN in the last 168 hours.  Liver Function Tests: No results for input(s): AST, ALT, ALKPHOS, BILITOT, PROT, ALBUMIN in the last 168 hours. No results for input(s): LIPASE, AMYLASE in the last 168 hours. No results for input(s): AMMONIA in the last 168 hours.  ABG    Component Value Date/Time   TCO2 29 11/03/2019 1157     Coagulation Profile: No results for input(s): INR, PROTIME in the last 168 hours.  Cardiac Enzymes: No results for input(s): CKTOTAL, CKMB, CKMBINDEX, TROPONINI in the last 168 hours.  HbA1C: Hemoglobin A1C  Date/Time Value Ref Range Status  05/06/2013 12:16 PM 5.2%  Final    Comment:    normal range 4.2-6.3%   Hgb A1c MFr Bld  Date/Time Value Ref Range Status  11/04/2019 04:24 AM 5.7 (H) 4.8 - 5.6 % Final    Comment:    (NOTE) Pre diabetes:          5.7%-6.4%  Diabetes:              >6.4%  Glycemic control for   <7.0% adults with diabetes     CBG: Recent Labs  Lab 05/06/23 1550  GLUCAP 159*    Review of Systems:   unable  Past Medical History:  He,  has a past medical history of Allergic rhinitis, Arthritis, BOOP (bronchiolitis obliterans with organizing pneumonia) (HCC), Bronchitis, Colon polyps, Complication of anesthesia, COPD (chronic  obstructive pulmonary disease) (HCC), Dyspnea, Dysrhythmia, GERD (gastroesophageal reflux disease), History of hiatal hernia, History of kidney stones, Hypertension, Hypothyroidism, Low back pain, Neuropathy (2024), Oxygen  dependent, Pneumonia, PONV (postoperative nausea and vomiting), Stroke (HCC), Thyroid  disease, and Tobacco dependence.   Surgical History:   Past Surgical History:  Procedure Laterality Date   BALLOON DILATION N/A 01/30/2022   Procedure: BALLOON DILATION;  Surgeon: Abran Norleen SAILOR, MD;  Location: [facility];  Service: Gastroenterology;  Laterality: N/A;   BIOPSY  08/18/2019   Procedure: BIOPSY;  Surgeon: Abran Norleen SAILOR, MD;  Location: [facility];  Service: Endoscopy;;   broncoscopy      CARDIAC CATHETERIZATION  07/03/2000   carpel tunnal     left   CATARACT EXTRACTION Right 03/31/2015   CATARACT EXTRACTION  Left 2024   COLONOSCOPY     x 4   DUPUYTREN CONTRACTURE RELEASE     Left   ESOPHAGOGASTRODUODENOSCOPY (EGD) WITH PROPOFOL  N/A 03/26/2016   Procedure: ESOPHAGOGASTRODUODENOSCOPY (EGD) WITH PROPOFOL ;  Surgeon: Norleen LOISE Kiang, MD;  Location: [facility];  Service: Endoscopy;  Laterality: N/A;   ESOPHAGOGASTRODUODENOSCOPY (EGD) WITH PROPOFOL  N/A 08/18/2019   Procedure: ESOPHAGOGASTRODUODENOSCOPY (EGD) WITH PROPOFOL ;  Surgeon: Kiang Norleen LOISE, MD;  Location: [facility];  Service: Endoscopy;  Laterality: N/A;  Possible dilation   ESOPHAGOGASTRODUODENOSCOPY (EGD) WITH PROPOFOL  N/A 01/30/2022   Procedure: ESOPHAGOGASTRODUODENOSCOPY (EGD) WITH PROPOFOL ;  Surgeon: Kiang Norleen LOISE, MD;  Location: [facility];  Service: Gastroenterology;  Laterality: N/A;   ESOPHAGOGASTRODUODENOSCOPY ENDOSCOPY     x 3   FACIAL FRACTURE SURGERY     1970s   LAMINECTOMY WITH POSTERIOR LATERAL ARTHRODESIS LEVEL 1 Right 08/17/2022   Procedure: Right - L4-L5 hemifacetectomy, posterolateral fusion, non-segmental instrumentation L4-5;  Surgeon: Joshua Alm RAMAN, MD;  Location: [facility];  Service:  Neurosurgery;  Laterality: Right;   LOOP RECORDER INSERTION N/A 11/05/2019   Procedure: LOOP RECORDER INSERTION;  Surgeon: Kelsie Agent, MD;  Location: [facility];  Service: Cardiovascular;  Laterality: N/A;   OTHER SURGICAL HISTORY     facial surgery   SAVORY DILATION N/A 08/18/2019   Procedure: SAVORY DILATION;  Surgeon: Kiang Norleen LOISE, MD;  Location: [facility];  Service: Endoscopy;  Laterality: N/A;   TOTAL HIP ARTHROPLASTY  01/2011   Right hip, [place]   ULNAR NERVE TRANSPOSITION     left     Social History:   reports that he quit smoking about 12 years ago. His smoking use included cigarettes. He started smoking about 62 years ago. He has a 50 pack-year smoking history. His smokeless tobacco use includes snuff. He reports that he does not currently use alcohol. He reports that he does not use drugs.   Family History:  His family history includes Arthritis in his father; Cancer in his mother and paternal grandmother; Colon cancer in an other family member; Healthy in his daughter and son; Prostate cancer in his maternal grandfather; Stomach cancer in his maternal grandmother; Stroke (age of onset: 6) in his father. There is no history of Pancreatic cancer or Esophageal cancer.   Allergies Allergies  Allergen Reactions   Nuts Nausea And Vomiting    sweating   Other Nausea And Vomiting    Per patient, cannot take any narcotic. Nausea and vomiting sweating   Vicodin [Hydrocodone-Acetaminophen ]     tachycardia   Codeine Nausea And Vomiting   Aspirin  Other (See Comments)    Upset stomach, bleeding   Penicillins Hives and Rash    Has patient had a PCN reaction causing immediate rash, facial/tongue/throat swelling, SOB or lightheadedness with hypotension:No Has patient had a PCN reaction causing severe rash involving mucus membranes or skin necrosis:No Has patient had a PCN reaction that required hospitalization:No Has patient had a PCN reaction occurring within the last  10 years:Yes If all of the above answers are NO, then may proceed with Cephalosporin use.      Home Medications  Prior to Admission medications   Medication Sig Start Date End Date Taking? Authorizing Provider  acetaminophen  (TYLENOL ) 500 MG tablet Take 1,000 mg by mouth every 6 (six) hours as needed (for pain.).   Yes [provider]  albuterol  (VENTOLIN  HFA) 108 (90 Base) MCG/ACT inhaler Inhale 1-2 puffs into the lungs every 4 (four) hours as needed for wheezing or  shortness of breath. INHALE TWO PUFFS INTO LUNGS EVERY 6 HOURS AS NEEDED FOR WHEEZING 12/10/22  Yes [name], Fonda LABOR, MD  budesonide -formoterol  (SYMBICORT ) 160-4.5 MCG/ACT inhaler Inhale 2 puffs into the lungs 2 (two) times daily. in the morning and at bedtime. 12/10/22  Yes [name], Fonda LABOR, MD  cetirizine  (ZYRTEC ) 10 MG tablet Take 1 tablet (10 mg total) by mouth at bedtime. 12/10/22  Yes [name], Fonda LABOR, MD  chlorhexidine  (HIBICLENS ) 4 % external liquid Apply 15 mLs (1 Application total) topically as directed for 30 doses. Use as directed daily for 5 days every other week for 6 weeks. 05/06/23  Yes Joshua Alm Hamilton, MD  clopidogrel  (PLAVIX ) 75 MG tablet Take 1 tablet (75 mg total) by mouth daily. 12/10/22  Yes [name], Fonda LABOR, MD  fluticasone  (FLONASE ) 50 MCG/ACT nasal spray Place 1 spray into both nostrils 2 (two) times daily as needed for allergies or rhinitis. Patient taking differently: Place 1 spray into both nostrils in the morning and at bedtime. 12/10/22  Yes [name], Fonda LABOR, MD  gabapentin  (NEURONTIN ) 300 MG capsule Take 300 mg by mouth in the morning and at bedtime.   Yes [provider]  Homeopathic Products (LEG CRAMP RELIEF PO) Take 2 tablets by mouth at bedtime. [place] leg cramp   Yes [provider]  ipratropium-albuterol  (DUONEB) 0.5-2.5 (3) MG/3ML SOLN Take 3 mLs by nebulization every 4 (four) hours as needed. 12/10/22  Yes [name], Fonda LABOR, MD  levothyroxine  (SYNTHROID )  137 MCG tablet Take 1 tablet (137 mcg total) by mouth daily before breakfast. 12/10/22  Yes [name], Fonda LABOR, MD  losartan -hydrochlorothiazide  (HYZAAR) 50-12.5 MG tablet Take 1 tablet by mouth daily. 12/10/22  Yes [name], Fonda LABOR, MD  magnesium  oxide (MAG-OX) 400 MG tablet Take 400 mg by mouth at bedtime.    Yes [provider]  meloxicam  (MOBIC ) 15 MG tablet Take 1 tablet (15 mg total) by mouth daily. 12/10/22  Yes [name], Fonda LABOR, MD  mupirocin  ointment (BACTROBAN ) 2 % Place 1 Application into the nose 2 (two) times daily for 60 doses. Use as directed 2 times daily for 5 days every other week for 6 weeks. 05/06/23 06/05/23 Yes Joshua Alm Hamilton, MD  omeprazole  (PRILOSEC) 40 MG capsule Take 1 capsule (40 mg total) by mouth in the morning and at bedtime. 12/10/22  Yes [name], Fonda LABOR, MD  OXYGEN  Inhale 3 L into the lungs at bedtime.   Yes [provider]  prednisoLONE  acetate (PRED FORTE ) 1 % ophthalmic suspension Place 1 drop into the left eye See admin instructions. INSTILL 1 DROP INTO LEFT EYE 4 TIMES DAILY FOR 7 DAYS THEN INSTILL 1 DROP THREE TIMES DAILY FOR 7 DAYS THEN INSTILL 1 DROP TWICE DAILY FOR 7 DAYS THEN INSTILL 1 DROP ONCE DAILY FOR 7 DAYS. START AFTER EYE SURGERY 04/08/23  Yes [provider]  SUPER B COMPLEX/C PO Take 1 capsule by mouth at bedtime.   Yes [provider]  traMADol  (ULTRAM ) 50 MG tablet Take 100 mg by mouth 4 (four) times daily as needed (pain.). 06/06/21  Yes [provider]  traZODone  (DESYREL ) 50 MG tablet Take 0.5-1 tablets (25-50 mg total) by mouth at bedtime as needed for sleep. 12/10/22  Yes [name], Fonda LABOR, MD     Critical care time: 45 mins       Lyle Pesa, MSN, AG-ACNP-BC  Pulmonary & Critical Care 05/06/2023, 4:53 PM  See Amion for pager If no response to pager , please call  [phone] until 7pm After 7:00 pm call Elink  [phone]

## 2023-05-06 NOTE — Progress Notes (Addendum)
 eLink Physician-Brief Progress Note Patient Name: TARENCE SEARCY DOB: 08-Sep-1945 MRN: [id number]   Date of Service  05/06/2023  HPI/Events of Note  78 year old male with chronic hypoxic/hypercapnic respiratory failure on home oxygen , hypertension and CKD stage IV who underwent elective decompressive lumbar laminectomy with fusion due to worsening back pain and radiculopathy, patient was extubated postprocedure, he became hypoxic agitated and went to respiratory distress requiring BVM and emergent reintubation.   Grabbing at his ET tube, very active in bed, already in mittens  eICU Interventions  Utilizes wrist restraints as needed   2011 - multiple attempts to place OG tube unsuccessful.  For now, hold enteral meds.  Anticipated extubation within 24 hours  2103 -lower blood pressure-MAP 63.  Responded to crystalloid bolus earlier in the day.  Will repeat bolus now.  Intervention Category Minor Interventions: Agitation / anxiety - evaluation and management  Asani Deniston 05/06/2023, 7:31 PM

## 2023-05-06 NOTE — Procedures (Signed)
 Arterial Line Insertion Start/End1/09/2023 6:25 PM, 05/06/2023 6:30 PM  Patient location: ICU. Preanesthetic checklist: patient identified, IV checked, site marked, risks and benefits discussed, surgical consent, monitors and equipment checked and timeout performed Right, radial was placed Catheter size: 20 G Hand hygiene performed  and maximum sterile barriers used  Allen's test indicative of satisfactory collateral circulation Attempts: 1 Procedure performed without using ultrasound guided technique. Following insertion, dressing applied and Biopatch. Post procedure assessment: normal  Patient tolerated the procedure well with no immediate complications.

## 2023-05-06 NOTE — Op Note (Signed)
 05/06/2023  3:06 PM  PATIENT:  Antonio Bowen  78 y.o. male  PRE-OPERATIVE DIAGNOSIS: Pseudoarthrosis L4-5 with loosening of instrumentation, back pain with radiculopathy  POST-OPERATIVE DIAGNOSIS:  same  PROCEDURE:   1. Decompressive lumbar laminectomy, hemi facetectomy and foraminotomies L4-5 left  2.  Transforaminal lumbar interbody fusion L4-5 left using PTI interbody cages packed with morcellized allograft and autograft  3.  Reinsertion of posterior fixation L4-5 using ATEC cortical pedicle screws.  4. Intertransverse arthrodesis L4-5 left using morcellized autograft and allograft. 5.  Exploration of arthrodesis L4-5 to confirm pseudoarthrosis with extraction of nonsegmental fixation  SURGEON:  Alm Molt, MD  ASSISTANTS: Suzen Pean, FNP  ANESTHESIA:  General  EBL: 200 ml  Total I/O In: -  Out: 200 [Blood:200]  BLOOD ADMINISTERED: None  DRAINS: none   INDICATION FOR PROCEDURE: This patient presented with back pain with radiculopathy. Imaging revealed probable pseudoarthrosis at L4-5 with loosening of instrumentation. The patient tried a reasonable attempt at conservative medical measures without relief. I recommended decompression and instrumented fusion to address the stenosis as well as the segmental  instability.  Patient understood the risks, benefits, and alternatives and potential outcomes and wished to proceed.  PROCEDURE DETAILS:  The patient was brought to the operating room. After induction of generalized endotracheal anesthesia the patient was rolled into the prone position on chest rolls and all pressure points were padded. The patient's lumbar region was cleaned and then prepped with DuraPrep and draped in the usual sterile fashion. Anesthesia was injected and then a dorsal midline incision was made and carried down to the lumbosacral fascia. The fascia was opened and the paraspinous musculature was taken down in a subperiosteal fashion to expose the  previously placed instrumentation.  The locking caps were removed and the rods were removed.  The left L4 screw and both L5 screws were somewhat loose in the pedicles and therefore those 3 screws were removed.  The right L4 pedicle screw still had good purchase.  We explored the posterior lateral arthrodesis and found no significant bridging bone between the transverse processes.   I then turned my attention to the decompression and complete lumbar laminectomies, hemi- facetectomies, and foraminotomies were performed at 4 5 left.  My nurse practitioner was directly involved in the decompression and exposure of the neural elements. the patient had significant spinal stenosis and this required more work than would be required for a simple exposure of the disc for transforaminal lumbar interbody fusion which would only require a limited laminotomy. Much more generous decompression and generous foraminotomy was undertaken in order to adequately decompress the neural elements and address the patient's leg pain. The yellow ligament was removed to expose the underlying dura and nerve roots, and generous foraminotomies were performed to adequately decompress the neural elements. Both the exiting and traversing nerve roots were decompressed until a coronary dilator passed easily along the nerve roots. Once the decompression was complete, I turned my attention to the transforaminal lumbar interbody fusion. The epidural venous vasculature was coagulated and cut sharply at L4-5 and the left. Disc space was incised and the initial discectomy was performed with pituitary rongeurs. We then used a series of scrapers and shavers to prepare the endplates for fusion. The midline was prepared with Epstein curettes. Once the complete discectomy was finished, we placed a small BMP soaked sponge and a mixture of morselized allograft and autograft into the anterior part of the disc space.  We packed an appropriate sized interbody cage  with local autograft and morcellized allograft, gently retracted the nerve root, and tapped the cage into position at L4-5 on the left across the midline.  I then packed more bone ipsilateral to the cage on the left.   We then turned our attention to the placement of the pedicle screws.  We placed 7.5 x 40 mm pedicle screws into the old pedicle screw holes at L4 on the left and L5 bilaterally.  These had good purchase.    We then decorticated the transverse processes and laid a mixture of morcellized autograft and allograft out over these to perform intertransverse arthrodesis at L4 5 on the left. We then placed lordotic rods into the multiaxial screw heads of the pedicle screws and locked these in position with the locking caps and anti-torque device. We then checked our construct with AP and lateral fluoroscopy. Irrigated with copious amounts of 0.5% povidone iodine solution followed by saline solution. Inspected the nerve roots once again to assure adequate decompression, lined to the dura with Gelfoam,  and then we closed the muscle and the fascia with 0 Vicryl. Closed the subcutaneous tissues with 2-0 Vicryl and subcuticular tissues with 3-0 Vicryl. The skin was closed with benzoin and Steri-Strips. Dressing was then applied, the patient was awakened from general anesthesia and transported to the recovery room in stable condition. At the end of the procedure all sponge, needle and instrument counts were correct.   PLAN OF CARE: admit to inpatient  PATIENT DISPOSITION:  PACU - hemodynamically stable.   Delay start of Pharmacological VTE agent (>24hrs) due to surgical blood loss or risk of bleeding:  yes

## 2023-05-06 NOTE — H&P (Signed)
 Subjective: Patient is a 78 y.o. male admitted for lumbar pseudoarthrosis. Onset of symptoms was a few months ago, gradually worsening since that time.  The pain is rated severe, and is located at the across the lower back and radiates to legs. The pain is described as aching and occurs all day. The symptoms have been progressive. Symptoms are exacerbated by exercise, standing, and walking for more than a few minutes. MRI or CT showed pseudoarthrosis L4-5 with loosening of instrumentation  Past Medical History:  Diagnosis Date   Allergic rhinitis    Arthritis    BOOP (bronchiolitis obliterans with organizing pneumonia) (HCC)    Bronchitis    Colon polyps    Complication of anesthesia    COPD (chronic obstructive pulmonary disease) (HCC)    Dyspnea    with exertion    Dysrhythmia    skipped beats   GERD (gastroesophageal reflux disease)    History of hiatal hernia    History of kidney stones    Hypertension    Hypothyroidism    Low back pain    Neuropathy 2024   right leg   Oxygen  dependent    uses 3L/[place] at nite occasional during day if needed    Pneumonia    BOOP   PONV (postoperative nausea and vomiting)    N/V with narcotics   Stroke (HCC)    hx of 2 strokes which patient did not know he had had . Weakness in left arm   Thyroid  disease    Tobacco dependence     Past Surgical History:  Procedure Laterality Date   BALLOON DILATION N/A 01/30/2022   Procedure: BALLOON DILATION;  Surgeon: Abran Norleen SAILOR, MD;  Location: [facility];  Service: Gastroenterology;  Laterality: N/A;   BIOPSY  08/18/2019   Procedure: BIOPSY;  Surgeon: Abran Norleen SAILOR, MD;  Location: [facility];  Service: Endoscopy;;   broncoscopy      CARDIAC CATHETERIZATION  07/03/2000   carpel tunnal     left   CATARACT EXTRACTION Right 03/31/2015   CATARACT EXTRACTION Left 2024   COLONOSCOPY     x 4   DUPUYTREN CONTRACTURE RELEASE     Left   ESOPHAGOGASTRODUODENOSCOPY (EGD) WITH PROPOFOL  N/A 03/26/2016    Procedure: ESOPHAGOGASTRODUODENOSCOPY (EGD) WITH PROPOFOL ;  Surgeon: Norleen SAILOR Abran, MD;  Location: [facility];  Service: Endoscopy;  Laterality: N/A;   ESOPHAGOGASTRODUODENOSCOPY (EGD) WITH PROPOFOL  N/A 08/18/2019   Procedure: ESOPHAGOGASTRODUODENOSCOPY (EGD) WITH PROPOFOL ;  Surgeon: Abran Norleen SAILOR, MD;  Location: [facility];  Service: Endoscopy;  Laterality: N/A;  Possible dilation   ESOPHAGOGASTRODUODENOSCOPY (EGD) WITH PROPOFOL  N/A 01/30/2022   Procedure: ESOPHAGOGASTRODUODENOSCOPY (EGD) WITH PROPOFOL ;  Surgeon: Abran Norleen SAILOR, MD;  Location: [facility];  Service: Gastroenterology;  Laterality: N/A;   ESOPHAGOGASTRODUODENOSCOPY ENDOSCOPY     x 3   FACIAL FRACTURE SURGERY     1970s   LAMINECTOMY WITH POSTERIOR LATERAL ARTHRODESIS LEVEL 1 Right 08/17/2022   Procedure: Right - L4-L5 hemifacetectomy, posterolateral fusion, non-segmental instrumentation L4-5;  Surgeon: Joshua Antonio RAMAN, MD;  Location: [facility];  Service: Neurosurgery;  Laterality: Right;   LOOP RECORDER INSERTION N/A 11/05/2019   Procedure: LOOP RECORDER INSERTION;  Surgeon: Kelsie Agent, MD;  Location: [facility];  Service: Cardiovascular;  Laterality: N/A;   OTHER SURGICAL HISTORY     facial surgery   SAVORY DILATION N/A 08/18/2019   Procedure: SAVORY DILATION;  Surgeon: Abran Norleen SAILOR, MD;  Location: [facility];  Service: Endoscopy;  Laterality: N/A;  TOTAL HIP ARTHROPLASTY  01/2011   Right hip, [place]   ULNAR NERVE TRANSPOSITION     left    Prior to Admission medications   Medication Sig Start Date End Date Taking? Authorizing Provider  acetaminophen  (TYLENOL ) 500 MG tablet Take 1,000 mg by mouth every 6 (six) hours as needed (for pain.).   Yes [provider]  albuterol  (VENTOLIN  HFA) 108 (90 Base) MCG/ACT inhaler Inhale 1-2 puffs into the lungs every 4 (four) hours as needed for wheezing or shortness of breath. INHALE TWO PUFFS INTO LUNGS EVERY 6 HOURS AS NEEDED FOR WHEEZING 12/10/22  Yes [name],  Fonda LABOR, MD  budesonide -formoterol  (SYMBICORT ) 160-4.5 MCG/ACT inhaler Inhale 2 puffs into the lungs 2 (two) times daily. in the morning and at bedtime. 12/10/22  Yes [name], Fonda LABOR, MD  cetirizine  (ZYRTEC ) 10 MG tablet Take 1 tablet (10 mg total) by mouth at bedtime. 12/10/22  Yes [name], Fonda LABOR, MD  clopidogrel  (PLAVIX ) 75 MG tablet Take 1 tablet (75 mg total) by mouth daily. 12/10/22  Yes [name], Fonda LABOR, MD  fluticasone  (FLONASE ) 50 MCG/ACT nasal spray Place 1 spray into both nostrils 2 (two) times daily as needed for allergies or rhinitis. Patient taking differently: Place 1 spray into both nostrils in the morning and at bedtime. 12/10/22  Yes [name], Fonda LABOR, MD  gabapentin  (NEURONTIN ) 300 MG capsule Take 300 mg by mouth in the morning and at bedtime.   Yes [provider]  Homeopathic Products (LEG CRAMP RELIEF PO) Take 2 tablets by mouth at bedtime. [place] leg cramp   Yes [provider]  ipratropium-albuterol  (DUONEB) 0.5-2.5 (3) MG/3ML SOLN Take 3 mLs by nebulization every 4 (four) hours as needed. 12/10/22  Yes [name], Fonda LABOR, MD  levothyroxine  (SYNTHROID ) 137 MCG tablet Take 1 tablet (137 mcg total) by mouth daily before breakfast. 12/10/22  Yes [name], Fonda LABOR, MD  losartan -hydrochlorothiazide  (HYZAAR) 50-12.5 MG tablet Take 1 tablet by mouth daily. 12/10/22  Yes [name], Fonda LABOR, MD  magnesium  oxide (MAG-OX) 400 MG tablet Take 400 mg by mouth at bedtime.    Yes [provider]  meloxicam  (MOBIC ) 15 MG tablet Take 1 tablet (15 mg total) by mouth daily. 12/10/22  Yes [name], Fonda LABOR, MD  omeprazole  (PRILOSEC) 40 MG capsule Take 1 capsule (40 mg total) by mouth in the morning and at bedtime. 12/10/22  Yes [name], Fonda LABOR, MD  OXYGEN  Inhale 3 L into the lungs at bedtime.   Yes [provider]  prednisoLONE  acetate (PRED FORTE ) 1 % ophthalmic suspension Place 1 drop into the left eye See admin instructions. INSTILL 1 DROP INTO  LEFT EYE 4 TIMES DAILY FOR 7 DAYS THEN INSTILL 1 DROP THREE TIMES DAILY FOR 7 DAYS THEN INSTILL 1 DROP TWICE DAILY FOR 7 DAYS THEN INSTILL 1 DROP ONCE DAILY FOR 7 DAYS. START AFTER EYE SURGERY 04/08/23  Yes [provider]  SUPER B COMPLEX/C PO Take 1 capsule by mouth at bedtime.   Yes [provider]  traMADol  (ULTRAM ) 50 MG tablet Take 100 mg by mouth 4 (four) times daily as needed (pain.). 06/06/21  Yes [provider]  traZODone  (DESYREL ) 50 MG tablet Take 0.5-1 tablets (25-50 mg total) by mouth at bedtime as needed for sleep. 12/10/22  Yes [name], Fonda LABOR, MD   Allergies  Allergen Reactions   Nuts Nausea And Vomiting    sweating   Other Nausea And Vomiting    Per patient, cannot take any narcotic. Nausea  and vomiting sweating   Vicodin [Hydrocodone-Acetaminophen ]     tachycardia   Codeine Nausea And Vomiting   Aspirin  Other (See Comments)    Upset stomach, bleeding   Penicillins Hives and Rash    Has patient had a PCN reaction causing immediate rash, facial/tongue/throat swelling, SOB or lightheadedness with hypotension:No Has patient had a PCN reaction causing severe rash involving mucus membranes or skin necrosis:No Has patient had a PCN reaction that required hospitalization:No Has patient had a PCN reaction occurring within the last 10 years:Yes If all of the above answers are NO, then may proceed with Cephalosporin use.     Social History   Tobacco Use   Smoking status: Former    Current packs/day: 0.00    Average packs/day: 1 pack/day for 50.0 years (50.0 ttl pk-yrs)    Types: Cigarettes    Start date: 07/29/1960    Quit date: 07/30/2010    Years since quitting: 12.7   Smokeless tobacco: Current    Types: Snuff   Tobacco comments:    10-12 snuff/day (pouches)  Substance Use Topics   Alcohol use: Not Currently    Comment: rare    Family History  Problem Relation Age of Onset   Arthritis Father    Stroke Father 71   Prostate cancer  Maternal Grandfather    Stomach cancer Maternal Grandmother    Cancer Paternal Grandmother    Colon cancer Other        mat 1st cousin   Cancer Mother    Healthy Daughter    Healthy Son    Pancreatic cancer Neg Hx    Esophageal cancer Neg Hx      Review of Systems  Positive ROS: neg  All other systems have been reviewed and were otherwise negative with the exception of those mentioned in the HPI and as above.  Objective: Vital signs in last 24 hours: Temp:  [98 F (36.7 C)] 98 F (36.7 C) (01/06 0700) Pulse Rate:  [67] 67 (01/06 0700) Resp:  [18] 18 (01/06 0700) BP: (127)/(70) 127/70 (01/06 0700) SpO2:  [93 %] 93 % (01/06 0700) Weight:  [92 kg] 92 kg (01/06 0852)  General Appearance: Alert, cooperative, no distress, appears stated age Head: Normocephalic, without obvious abnormality, atraumatic Eyes: PERRL, conjunctiva/corneas clear, EOM's intact    Neck: Supple, symmetrical, trachea midline Back: Symmetric, no curvature, ROM normal, no CVA tenderness Lungs:  respirations unlabored Heart: Regular rate and rhythm Abdomen: Soft, non-tender Extremities: Extremities normal, atraumatic, no cyanosis or edema Pulses: 2+ and symmetric all extremities Skin: Skin color, texture, turgor normal, no rashes or lesions  NEUROLOGIC:   Mental status: Alert and oriented x4,  no aphasia, good attention span, fund of knowledge, and memory Motor Exam - grossly normal Sensory Exam - grossly normal Reflexes: 1+ Coordination - grossly normal Gait - grossly normal Balance - grossly normal Cranial Nerves: I: smell Not tested  II: visual acuity  OS: nl    OD: nl  II: visual fields Full to confrontation  II: pupils Equal, round, reactive to light  III,VII: ptosis None  III,IV,VI: extraocular muscles  Full ROM  V: mastication Normal  V: facial light touch sensation  Normal  V,VII: corneal reflex  Present  VII: facial muscle function - upper  Normal  VII: facial muscle function - lower  Normal  VIII: hearing Not tested  IX: soft palate elevation  Normal  IX,X: gag reflex Present  XI: trapezius strength  5/5  XI: sternocleidomastoid strength 5/5  XI: neck flexion strength  5/5  XII: tongue strength  Normal    Data Review Lab Results  Component Value Date   WBC 9.6 04/25/2023   HGB 14.4 04/25/2023   HCT 44.5 04/25/2023   MCV 94.7 04/25/2023   PLT 194 04/25/2023   Lab Results  Component Value Date   NA 141 04/25/2023   K 4.1 04/25/2023   CL 107 04/25/2023   CO2 23 04/25/2023   BUN 22 04/25/2023   CREATININE 2.37 (H) 04/25/2023   GLUCOSE 99 04/25/2023   Lab Results  Component Value Date   INR 1.0 04/25/2023    Assessment/Plan:  Estimated body mass index is 29.95 kg/m as calculated from the following:   Height as of this encounter: 5' 9 (1.753 m).   Weight as of this encounter: 92 kg. Patient admitted for TLIF L4-5 with revision of the instrumentation for symptomatic lumbar pseudoarthrosis. Patient has failed a reasonable attempt at conservative therapy.  I explained the condition and procedure to the patient and answered any questions.  Patient wishes to proceed with procedure as planned. Understands risks/ benefits and typical outcomes of procedure.   Antonio Bowen 05/06/2023 11:52 AM

## 2023-05-06 NOTE — Progress Notes (Signed)
 Pt having difficulty moving air. Nasal cannula tried and pt O2 sats remained below 92%. Simple mask applied and pt O2 sats improved to 94%.Pt still having difficulty breathing. Pt removed oxygen  mask because he became nauseous. Pt tried to sit up on the side of bed. Noticed pt having more difficulty breathing. Pt O2 sats reading 78%. MD called to the bedside. Pt re-intubated in PACU.

## 2023-05-06 NOTE — Anesthesia Postprocedure Evaluation (Addendum)
 Anesthesia Post Note  Patient: JAISHAUN MCNAB  Procedure(s) Performed: Transforaminal Lumbar Interbody Fusion - Lumbar Four - Lumbar Five - left, hardware revision - Posterior Lateral and Interbody fusion (Left: Back)     Patient location during evaluation: PACU Anesthesia Type: General Level of consciousness: obtunded/minimal responses Vital Signs Assessment: post-procedure vital signs reviewed and stable Respiratory status: patient remains intubated per anesthesia plan Cardiovascular status: stable Postop Assessment: no apparent nausea or vomiting Anesthetic complications: no Comments: Pt intubated in PACU due to respiratory compromise.   No notable events documented.  Last Vitals:  Vitals:   05/06/23 1621 05/06/23 1640  BP: (!) 143/92 115/69  Pulse: 83 82  Resp: (!) 26 (!) 26  Temp:    SpO2: 96% 96%    Last Pain:  Vitals:   05/06/23 1515  TempSrc:   PainSc: 0-No pain                 Franky JONETTA Bald

## 2023-05-06 NOTE — Anesthesia Procedure Notes (Signed)
 Procedure Name: Intubation Date/Time: 05/06/2023 3:55 PM  Performed by: Tilford Franky BIRCH, MDPre-anesthesia Checklist: Patient identified, Emergency Drugs available, Suction available, Patient being monitored and Timeout performed Patient Re-evaluated:Patient Re-evaluated prior to induction Oxygen  Delivery Method: Circle system utilized Preoxygenation: Pre-oxygenation with 100% oxygen  Induction Type: IV induction Laryngoscope Size: Glidescope and 3 Grade View: Grade I Tube size: 7.0 mm Number of attempts: 1 Placement Confirmation: positive ETCO2, ETT inserted through vocal cords under direct vision, CO2 detector and breath sounds checked- equal and bilateral Secured at: 22 cm Tube secured with: Tape Dental Injury: Teeth and Oropharynx as per pre-operative assessment

## 2023-05-06 NOTE — Transfer of Care (Signed)
 Immediate Anesthesia Transfer of Care Note  Patient: Antonio Bowen  Procedure(s) Performed: Transforaminal Lumbar Interbody Fusion - Lumbar Four - Lumbar Five - left, hardware revision - Posterior Lateral and Interbody fusion (Left: Back)  Patient Location: PACU  Anesthesia Type:General  Level of Consciousness: awake, alert , and oriented  Airway & Oxygen  Therapy: Patient Spontanous Breathing and Patient connected to face mask oxygen   Post-op Assessment: Report given to RN and Post -op Vital signs reviewed and stable  Post vital signs: Reviewed and stable  Last Vitals:  Vitals Value Taken Time  BP 115/69 05/06/23 1640  Temp 36 C 05/06/23 1654  Pulse 73 05/06/23 1700  Resp 26 05/06/23 1700  SpO2 95 % 05/06/23 1700  Vitals shown include unfiled device data.  Last Pain:  Vitals:   05/06/23 1654  TempSrc: Axillary  PainSc:          Complications: No notable events documented.

## 2023-05-06 NOTE — Anesthesia Procedure Notes (Signed)
 Procedure Name: Intubation Date/Time: 05/06/2023 12:57 PM  Performed by: Vinita Geanie LABOR, CRNAPre-anesthesia Checklist: Patient identified, Emergency Drugs available, Suction available and Patient being monitored Patient Re-evaluated:Patient Re-evaluated prior to induction Oxygen  Delivery Method: Circle system utilized Preoxygenation: Pre-oxygenation with 100% oxygen  Induction Type: IV induction Ventilation: Mask ventilation without difficulty Laryngoscope Size: Mac and 4 Grade View: Grade I Tube type: Oral Tube size: 7.5 mm Number of attempts: 1 Airway Equipment and Method: Stylet and Oral airway Placement Confirmation: ETT inserted through vocal cords under direct vision, positive ETCO2 and breath sounds checked- equal and bilateral Secured at: 22 cm Tube secured with: Tape Dental Injury: Teeth and Oropharynx as per pre-operative assessment

## 2023-05-07 DIAGNOSIS — Z981 Arthrodesis status: Secondary | ICD-10-CM | POA: Diagnosis not present

## 2023-05-07 LAB — BASIC METABOLIC PANEL
Anion gap: 11 (ref 5–15)
BUN: 29 mg/dL — ABNORMAL HIGH (ref 8–23)
CO2: 21 mmol/L — ABNORMAL LOW (ref 22–32)
Calcium: 8.7 mg/dL — ABNORMAL LOW (ref 8.9–10.3)
Chloride: 108 mmol/L (ref 98–111)
Creatinine, Ser: 2.22 mg/dL — ABNORMAL HIGH (ref 0.61–1.24)
GFR, Estimated: 30 mL/min — ABNORMAL LOW (ref >60)
Glucose, Bld: 151 mg/dL — ABNORMAL HIGH (ref 70–99)
Potassium: 4.5 mmol/L (ref 3.5–5.1)
Sodium: 140 mmol/L (ref 135–145)

## 2023-05-07 LAB — POCT I-STAT 7, (LYTES, BLD GAS, ICA,H+H)
Acid-base deficit: 1 mmol/L (ref 0.0–2.0)
Bicarbonate: 23.2 mmol/L (ref 20.0–28.0)
Calcium, Ion: 1.23 mmol/L (ref 1.15–1.40)
HCT: 38 % — ABNORMAL LOW (ref 39.0–52.0)
Hemoglobin: 12.9 g/dL — ABNORMAL LOW (ref 13.0–17.0)
O2 Saturation: 97 %
Patient temperature: 98.1
Potassium: 4.4 mmol/L (ref 3.5–5.1)
Sodium: 140 mmol/L (ref 135–145)
TCO2: 24 mmol/L (ref 22–32)
pCO2 arterial: 37.5 mm[Hg] (ref 32–48)
pH, Arterial: 7.399 (ref 7.35–7.45)
pO2, Arterial: 85 mm[Hg] (ref 83–108)

## 2023-05-07 LAB — CBC
HCT: 40.1 % (ref 39.0–52.0)
Hemoglobin: 13.6 g/dL (ref 13.0–17.0)
MCH: 31.3 pg (ref 26.0–34.0)
MCHC: 33.9 g/dL (ref 30.0–36.0)
MCV: 92.2 fL (ref 80.0–100.0)
Platelets: 145 10*3/uL — ABNORMAL LOW (ref 150–400)
RBC: 4.35 MIL/uL (ref 4.22–5.81)
RDW: 12.8 % (ref 11.5–15.5)
WBC: 12 10*3/uL — ABNORMAL HIGH (ref 4.0–10.5)
nRBC: 0 % (ref 0.0–0.2)

## 2023-05-07 LAB — GLUCOSE, CAPILLARY
Glucose-Capillary: 147 mg/dL — ABNORMAL HIGH (ref 70–99)
Glucose-Capillary: 128 mg/dL — ABNORMAL HIGH (ref 70–99)
Glucose-Capillary: 104 mg/dL — ABNORMAL HIGH (ref 70–99)

## 2023-05-07 MED ORDER — METHOCARBAMOL 1000 MG/10ML IJ SOLN
500.0000 mg | Freq: Four times a day (QID) | INTRAMUSCULAR | Status: DC | PRN
Start: 2023-05-07 — End: 2023-05-07
  Administered 2023-05-07: 500 mg via INTRAVENOUS
  Filled 2023-05-07: qty 10

## 2023-05-07 MED ORDER — METHOCARBAMOL 500 MG PO TABS: 500.0000 mg | ORAL_TABLET | Freq: Four times a day (QID) | ORAL | Status: DC | PRN

## 2023-05-07 MED ORDER — OXYCODONE HCL 5 MG PO TABS
5.0000 mg | ORAL_TABLET | ORAL | Status: DC | PRN
Start: 2023-05-07 — End: 2023-05-07

## 2023-05-07 MED ORDER — METHOCARBAMOL 1000 MG/10ML IJ SOLN: 500.0000 mg | Freq: Four times a day (QID) | INTRAMUSCULAR | Status: DC | PRN

## 2023-05-07 MED ORDER — ONDANSETRON HCL 4 MG/2ML IJ SOLN
4.0000 mg | Freq: Four times a day (QID) | INTRAMUSCULAR | Status: DC | PRN
Start: 2023-05-07 — End: 2023-05-07

## 2023-05-07 MED ORDER — POLYETHYLENE GLYCOL 3350 17 G PO PACK
17.0000 g | PACK | Freq: Every day | ORAL | Status: DC
  Administered 2023-05-07: 17 g via ORAL
  Filled 2023-05-07: qty 1

## 2023-05-07 MED ORDER — ONDANSETRON HCL 4 MG PO TABS
4.0000 mg | ORAL_TABLET | Freq: Four times a day (QID) | ORAL | Status: DC | PRN
  Administered 2023-05-07: 4 mg via ORAL
  Filled 2023-05-07: qty 1

## 2023-05-07 MED ORDER — GABAPENTIN 300 MG PO CAPS
300.0000 mg | ORAL_CAPSULE | Freq: Two times a day (BID) | ORAL | Status: DC
  Filled 2023-05-07: qty 1

## 2023-05-07 MED ORDER — GABAPENTIN 300 MG PO CAPS
300.0000 mg | ORAL_CAPSULE | Freq: Two times a day (BID) | ORAL | Status: DC
  Administered 2023-05-07: 300 mg via ORAL

## 2023-05-07 MED ORDER — METHOCARBAMOL 750 MG PO TABS: 750.0000 mg | ORAL_TABLET | Freq: Four times a day (QID) | ORAL | 0 refills | Status: DC

## 2023-05-07 MED ORDER — FENTANYL CITRATE PF 50 MCG/ML IJ SOSY
12.5000 ug | PREFILLED_SYRINGE | INTRAMUSCULAR | Status: DC | PRN
  Administered 2023-05-07 (×2): 12.5 ug via INTRAVENOUS
  Filled 2023-05-07 (×2): qty 1

## 2023-05-07 MED ORDER — OXYCODONE-ACETAMINOPHEN 5-325 MG PO TABS: 1.0000 | ORAL_TABLET | ORAL | 0 refills | Status: DC | PRN

## 2023-05-07 MED ORDER — ONDANSETRON HCL 4 MG/2ML IJ SOLN: 4.0000 mg | Freq: Four times a day (QID) | INTRAMUSCULAR | Status: DC | PRN

## 2023-05-07 MED ORDER — ACETAMINOPHEN 650 MG RE SUPP
650.0000 mg | Freq: Four times a day (QID) | RECTAL | Status: DC
  Administered 2023-05-07: 650 mg via RECTAL
  Filled 2023-05-07: qty 1

## 2023-05-07 MED ORDER — ONDANSETRON HCL 4 MG PO TABS: 4.0000 mg | ORAL_TABLET | Freq: Four times a day (QID) | ORAL | Status: DC | PRN

## 2023-05-07 MED ORDER — OXYCODONE HCL 5 MG PO TABS
5.0000 mg | ORAL_TABLET | ORAL | Status: DC | PRN
  Administered 2023-05-07: 5 mg via ORAL
  Filled 2023-05-07: qty 1

## 2023-05-07 MED ORDER — GABAPENTIN 300 MG PO CAPS: 300.0000 mg | ORAL_CAPSULE | Freq: Two times a day (BID) | ORAL | Status: DC

## 2023-05-07 MED ORDER — PANTOPRAZOLE SODIUM 40 MG PO TBEC
40.0000 mg | DELAYED_RELEASE_TABLET | Freq: Every day | ORAL | Status: DC
  Administered 2023-05-07: 40 mg via ORAL
  Filled 2023-05-07: qty 1

## 2023-05-07 MED ORDER — ACETAMINOPHEN 325 MG PO TABS: 650.0000 mg | ORAL_TABLET | Freq: Four times a day (QID) | ORAL | Status: DC

## 2023-05-07 NOTE — Evaluation (Signed)
 Occupational Therapy Evaluation Patient Details Name: Antonio Bowen MRN: [id number] DOB: 09/08/1945 Today's Date: 05/07/2023   History of Present Illness 78 yo s/p 1/6 lumbar laminectomy with fusion secondary to L4-5 loosening instrumentation  PMH 3L O2 nightly HTN CKD COPD former smoker BOOP CVA, HOH   Clinical Impression   Patient evaluated by Occupational Therapy with no further acute OT needs identified. All education has been completed and the patient has no further questions. See below for any follow-up Occupational Therapy or equipment needs. OT to sign off. Thank you for referral.         If plan is discharge home, recommend the following: A little help with bathing/dressing/bathroom;Assist for transportation    Functional Status Assessment  Patient has had a recent decline in their functional status and demonstrates the ability to make significant improvements in function in a reasonable and predictable amount of time.  Equipment Recommendations  None recommended by OT    Recommendations for Other Services       Precautions / Restrictions Precautions Precautions: Back Precaution Comments: education on back precaution with adls. handout provided for energy conservation as well      Mobility Bed Mobility Overal bed mobility: Needs Assistance Bed Mobility: Rolling, Supine to Sit Rolling: Min assist   Supine to sit: Min assist     General bed mobility comments: pt progressed to the R side of the bed and sitting eob. pt with help initial first time out of the bed with back precautions. pt on flat bed surface to simulate home    Transfers Overall transfer level: Needs assistance Equipment used: Rolling walker (2 wheels) Transfers: Sit to/from Stand Sit to Stand: Min assist           General transfer comment: pt pulling up on RW with initial transfer and posterior bias. pt transfered from chair with bil ue on chair arm rest supervision level and not pulling on  RW.      Balance Overall balance assessment: Mild deficits observed, not formally tested                                         ADL either performed or assessed with clinical judgement   ADL Overall ADL's : Needs assistance/impaired Eating/Feeding: Independent   Grooming: Wash/dry hands;Supervision/safety;Standing                   Toilet Transfer: Supervision/safety;Ambulation;Regular Toilet;Requires wide/bariatric;Rolling walker (2 wheels)           Functional mobility during ADLs: Supervision/safety;Rollator (4 wheels)  Back handout provided and reviewed adls in detail. Pt educated on: set an alarm at night for medication, avoid sitting for long periods of time, correct bed positioning for sleeping, correct sequence for bed mobility, avoiding lifting more than 5 pounds and never wash directly over incision. All education is complete and patient indicates understanding.      Vision Baseline Vision/History:  (baseline double vision. pt with injury to L eye in 1971 with surgery to face that resulted in long term double vision) Ability to See in Adequate Light: 0 Adequate Patient Visual Report: No change from baseline;Diplopia       Perception         Praxis         Pertinent Vitals/Pain Pain Assessment Pain Assessment: Faces Faces Pain Scale: Hurts a little bit Pain Location: back Pain Descriptors /  Indicators: Discomfort Pain Intervention(s): Monitored during session, Premedicated before session     Extremity/Trunk Assessment Upper Extremity Assessment Upper Extremity Assessment: Overall WFL for tasks assessed   Lower Extremity Assessment Lower Extremity Assessment: Overall WFL for tasks assessed   Cervical / Trunk Assessment Cervical / Trunk Assessment: Back Surgery   Communication Communication Communication: Hearing impairment   Cognition Arousal: Alert Behavior During Therapy: WFL for tasks assessed/performed Overall  Cognitive Status: Impaired/Different from baseline Area of Impairment: Memory                     Memory: Decreased short-term memory         General Comments: pt requires repetition for lifting restriction. pt able to recall Bending immediately. pt needs reinforcement of precautions during adls     General Comments  incision dry and dressing intact, RA 88-93% HR 125 max with movement. pt noted to have 130HR on commode but also actively using restroom    Exercises     Shoulder Instructions      Home Living Family/patient expects to be discharged to:: Private residence Living Arrangements: Spouse/significant other Available Help at Discharge: Family;Available 24 hours/day Type of Home: House Home Access: Level entry     Home Layout: One level     Bathroom Shower/Tub: Producer, Television/film/video: Handicapped height     Home Equipment: Rollator (4 wheels);Other (comment);Rolling Walker (2 wheels);Cane - single point;Shower seat;Grab bars - tub/shower;Grab bars - toilet;Adaptive equipment;Lift chair (standard bed mattres) Adaptive Equipment: Reacher;Long-handled shoe horn Additional Comments: Pt wife reports she is comfortable with helping him complete lower body dressing      Prior Functioning/Environment Prior Level of Function : Independent/Modified Independent;Driving             Mobility Comments: used stand up walker ADLs Comments: mod I to independent, but wife assists with putting on socks when he does wear them        OT Problem List:        OT Treatment/Interventions:      OT Goals(Current goals can be found in the care plan section) Acute Rehab OT Goals Patient Stated Goal: to go home today Potential to Achieve Goals: Good  OT Frequency:      Co-evaluation              AM-PAC OT 6 Clicks Daily Activity     Outcome Measure Help from another person eating meals?: None Help from another person taking care of personal  grooming?: None Help from another person toileting, which includes using toliet, bedpan, or urinal?: A Little Help from another person bathing (including washing, rinsing, drying)?: A Little Help from another person to put on and taking off regular upper body clothing?: None Help from another person to put on and taking off regular lower body clothing?: A Little 6 Click Score: 21   End of Session Equipment Utilized During Treatment: Gait belt;Rolling walker (2 wheels) Nurse Communication: Mobility status;Precautions  Activity Tolerance: Patient tolerated treatment well Patient left: Other (comment);with call bell/phone within reach;with nursing/sitter in room;with family/visitor present (in bathroom with RN to assist back to chair)  OT Visit Diagnosis: Unsteadiness on feet (R26.81);Muscle weakness (generalized) (M62.81)                Time: 8569-8497 OT Time Calculation (min): 32 min Charges:  OT General Charges $OT Visit: 1 Visit OT Evaluation $OT Eval Moderate Complexity: 1 Mod OT Treatments $Self Care/Home Management : 8-22 mins  Antonio Bowen, OTR/L  Acute Rehabilitation Services Office: [phone] .   Antonio Bowen Molt 05/07/2023, 3:44 PM

## 2023-05-07 NOTE — Procedures (Signed)
 Extubation Procedure Note  Patient Details:   Name: Antonio Bowen DOB: 06/07/1945 MRN: [id number]   Airway Documentation:    Vent end date: 05/07/23 Vent end time: 0926   Evaluation  O2 sats: stable throughout Complications: No apparent complications Patient did tolerate procedure well. Bilateral Breath Sounds: Diminished   Yes  RT extubated pt to bipap per CCM/order. Pt had a positive cuff leak, good cough, able to state name and is tolerating bipap well at this time.   Shan LITTIE Collum 05/07/2023, 9:27 AM

## 2023-05-07 NOTE — Progress Notes (Signed)
   05/07/23 0930  Vent Select  Invasive or Noninvasive Noninvasive  Adult Vent Y  Adult Ventilator Settings  Vent Type Servo i  Humidity HME  Vent Mode BIPAP;PCV  Set Rate 10 bmp  FiO2 (%) 40 %  Pressure Control 5 cmH20 (above peep)  PEEP 5 cmH20  Adult Ventilator Measurements  Peak Airway Pressure 11 L/min  Mean Airway Pressure 9 cmH20  Resp Rate Spontaneous 14 br/min  Resp Rate Total 24 br/min  Spont TV 494 mL  Measured Ve 15.6 L  Auto PEEP 0 cmH20  Total PEEP 5 cmH20

## 2023-05-07 NOTE — Progress Notes (Signed)
 NAME:  Antonio Bowen, MRN:  [id number], DOB:  [birth year], LOS: 1 ADMISSION DATE:  05/06/2023, CONSULTATION DATE:  05/05/2022 REFERRING MD:  NSGY, CHIEF COMPLAINT:  resp failure   History of Present Illness:  Pt encephalopathic, therefore HPI obtained from medical chart review.    89 yoM with PMH significant for but not limited to former smoker, COPD, O2 dependent at night 3L, BOOP, GERD, and HTN, thyroid  disease and CKD.  Pt underwent elective decompressive lumbar laminectomy with fusion by Dr. Joshua for worsening back pain and radiculopathy secondary to pseudoarthritis of L4-5 with loosening of instrumentation.  Extubated but developed worsening agitation, hypoxia, and respiratory distress in PACU requiring BVM and emergent reintubation with ABG 7.187/ 56/ 105/ 21 after re-intubation.  PCCM consulted for further medical and vent management.   Pertinent  Medical History   Past Medical History:  Diagnosis Date   Allergic rhinitis    Arthritis    BOOP (bronchiolitis obliterans with organizing pneumonia) (HCC)    Bronchitis    Colon polyps    Complication of anesthesia    COPD (chronic obstructive pulmonary disease) (HCC)    Dyspnea    with exertion    Dysrhythmia    skipped beats   GERD (gastroesophageal reflux disease)    History of hiatal hernia    History of kidney stones    Hypertension    Hypothyroidism    Low back pain    Neuropathy 2024   right leg   Oxygen  dependent    uses 3L/[place] at nite occasional during day if needed    Pneumonia    BOOP   PONV (postoperative nausea and vomiting)    N/V with narcotics   Stroke (HCC)    hx of 2 strokes which patient did not know he had had . Weakness in left arm   Thyroid  disease    Tobacco dependence    Significant Hospital Events: Including procedures, antibiotic start and stop dates in addition to other pertinent events   1/6 lumbar laminectomy, reintubated in PACU  Interim History / Subjective:  No events overnight Unable  to place OGT overnight Awake, f/c on low dose precedex .  Points to pain in back  Objective   Blood pressure 119/77, pulse 63, temperature 98.1 F (36.7 C), temperature source Axillary, resp. rate (!) 25, height 5' 9 (1.753 m), weight 92 kg, SpO2 94%.    Vent Mode: PRVC FiO2 (%):  [60 %-100 %] 60 % Set Rate:  [26 bmp] 26 bmp Vt Set:  [560 mL] 560 mL PEEP:  [5 cmH20-10 cmH20] 10 cmH20 Plateau Pressure:  [13 cmH20-32 cmH20] 21 cmH20   Intake/Output Summary (Last 24 hours) at 05/07/2023 9187 Last data filed at 05/07/2023 0600 Gross per 24 hour  Intake 2115.7 ml  Output 1050 ml  Net 1065.7 ml   Filed Weights   05/06/23 0852  Weight: 92 kg   Examination: General:  Elderly male in bed in NAD on MV HEENT: MM pink/moist, ETT, pupils 3/r Neuro: Awake, follows commands, MAE CV: rr, NSR, no murmur PULM:  non labored on MV, clear/ diminished, no wheeze or secretions, flipped to PSV tolerating 5/5 GI: soft, bs+ NT, purwick Extremities: warm/dry, no LE edema  Skin: no rashes  UOP 878ml/ 24hrs Net +1L Labs reviewed> stable sCr, WBC, plts 155> 145 afebrile  Resolved Hospital Problem list    Assessment & Plan:   Acute hypoxic and hypercarbic respiratory failure COPD- doubt AE, no evidence of bronchospasm Nocturnal hypoxemia on  3L  P:  - weaning well currently, hopeful to extubate.  Ensure cuff leak.  Will extubate to BiPAP empirically. 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     HTN - cont to hold losartan /  hydrochlorothiazide .  Remains normotensive  CKD - baseline sCr 2- 2.37 - stable sCr and UOP, continue to monitor - BMET in am   Hypothyroid - resume synthroid  when able  Best Practice (right click and Reselect all SmartList Selections daily)   Diet/type: NPO DVT prophylaxis SCD Pressure ulcer(s): N/A GI prophylaxis: PPI Lines: N/A Foley:  N/A Code Status:  full code Last date of multidisciplinary goals of care discussion [per primary team]  Wife updated at bedside 1/7 am  Labs   CBC: Recent Labs  Lab 05/06/23 1607 05/06/23 1657 05/06/23 1715 05/07/23 0526 05/07/23 0531  WBC  --  11.9*  --  12.0*  --   HGB 14.3 14.0 13.6 13.6 12.9*  HCT 42.0 42.2 40.0 40.1 38.0*  MCV  --  93.8  --  92.2  --   PLT  --  155  --  145*  --     Basic Metabolic Panel: Recent Labs  Lab 05/06/23 1607 05/06/23 1657 05/06/23 1715 05/07/23 0526 05/07/23 0531  NA 140 139 139 140 140  K 5.1 4.7 4.6 4.5 4.4  CL  --  109  --  108  --   CO2  --  21*  --  21*  --   GLUCOSE  --  159*  --  151*  --   BUN  --  24*  --  29*  --   CREATININE  --  2.23*  --  2.22*  --   CALCIUM   --  8.5*  --  8.7*  --   MG  --  1.7  --   --   --    GFR: Estimated Creatinine Clearance: 31.2 mL/min (A) (by C-G formula based on SCr of 2.22 mg/dL (H)). Recent Labs  Lab 05/06/23 1657 05/07/23 0526  WBC 11.9* 12.0*    Liver Function Tests: No results for input(s): AST, ALT, ALKPHOS, BILITOT, PROT, ALBUMIN in the last 168 hours. No results for input(s): LIPASE, AMYLASE in the last 168 hours. No results for input(s): AMMONIA in the last 168 hours.  ABG    Component Value Date/Time   PHART 7.399 05/07/2023 0531   PCO2ART 37.5 05/07/2023 0531   PO2ART 85 05/07/2023 0531   HCO3 23.2 05/07/2023 0531   TCO2 24 05/07/2023 0531   ACIDBASEDEF 1.0 05/07/2023 0531   O2SAT 97 05/07/2023 0531     Coagulation Profile: No results for input(s): INR, PROTIME in the last 168  hours.  Cardiac Enzymes: No results for input(s): CKTOTAL, CKMB, CKMBINDEX, TROPONINI in the last 168 hours.  HbA1C: Hemoglobin A1C  Date/Time Value Ref Range Status  05/06/2013 12:16 PM 5.2%  Final    Comment:    normal range 4.2-6.3%   Hgb A1c MFr Bld  Date/Time Value Ref Range Status  11/04/2019 04:24 AM 5.7 (  H) 4.8 - 5.6 % Final    Comment:    (NOTE) Pre diabetes:          5.7%-6.4%  Diabetes:              >6.4%  Glycemic control for   <7.0% adults with diabetes     CBG: Recent Labs  Lab 05/06/23 1550 05/06/23 1714 05/07/23 0546 05/07/23 0747  GLUCAP 159* 159* 147* 128*  Allergies Allergies  Allergen Reactions   Nuts Nausea And Vomiting    sweating   Other Nausea And Vomiting    Per patient, cannot take any narcotic. Nausea and vomiting sweating   Vicodin [Hydrocodone-Acetaminophen ]     tachycardia   Codeine Nausea And Vomiting   Aspirin  Other (See Comments)    Upset stomach, bleeding   Penicillins Hives and Rash    Has patient had a PCN reaction causing immediate rash, facial/tongue/throat swelling, SOB or lightheadedness with hypotension:No Has patient had a PCN reaction causing severe rash involving mucus membranes or skin necrosis:No Has patient had a PCN reaction that required hospitalization:No Has patient had a PCN reaction occurring within the last 10 years:Yes If all of the above answers are NO, then may proceed with Cephalosporin use.      Home Medications  Prior to Admission medications   Medication Sig Start Date End Date Taking? Authorizing Provider  acetaminophen  (TYLENOL ) 500 MG tablet Take 1,000 mg by mouth every 6 (six) hours as needed (for pain.).   Yes [provider]  albuterol  (VENTOLIN  HFA) 108 (90 Base) MCG/ACT inhaler Inhale 1-2 puffs into the lungs every 4 (four) hours as needed for wheezing or shortness of breath. INHALE TWO PUFFS INTO LUNGS EVERY 6 HOURS AS NEEDED FOR WHEEZING 12/10/22  Yes [name], Fonda LABOR, MD  budesonide -formoterol  (SYMBICORT ) 160-4.5 MCG/ACT inhaler Inhale 2 puffs into the lungs 2 (two) times daily. in the morning and at bedtime. 12/10/22  Yes [name], Fonda LABOR, MD  cetirizine  (ZYRTEC ) 10 MG tablet Take 1 tablet (10 mg total) by mouth at bedtime. 12/10/22  Yes [name], Fonda LABOR, MD  chlorhexidine  (HIBICLENS ) 4 % external liquid Apply 15 mLs (1 Application total) topically as directed for 30 doses. Use as directed daily for 5 days every other week for 6 weeks. 05/06/23  Yes Joshua Alm Hamilton, MD  clopidogrel  (PLAVIX ) 75 MG tablet Take 1 tablet (75 mg total) by mouth daily. 12/10/22  Yes [name], Fonda LABOR, MD  fluticasone  (FLONASE ) 50 MCG/ACT nasal spray Place 1 spray into both nostrils 2 (two) times daily as needed for allergies or rhinitis. Patient taking differently: Place 1 spray into both nostrils in the morning and at bedtime. 12/10/22  Yes [name], Fonda LABOR, MD  gabapentin  (NEURONTIN ) 300 MG capsule Take 300 mg by mouth in the morning and at bedtime.   Yes [provider]  Homeopathic Products (LEG CRAMP RELIEF PO) Take 2 tablets by mouth at bedtime. [place] leg cramp   Yes [provider]  ipratropium-albuterol  (DUONEB) 0.5-2.5 (3) MG/3ML SOLN Take 3 mLs by nebulization every 4 (four) hours as needed. 12/10/22  Yes [name], Fonda LABOR, MD  levothyroxine  (SYNTHROID ) 137 MCG tablet Take 1 tablet (137 mcg total) by mouth daily before breakfast. 12/10/22  Yes [name], Fonda LABOR, MD  losartan -hydrochlorothiazide  (HYZAAR) 50-12.5 MG tablet Take 1 tablet by mouth daily. 12/10/22  Yes [name], Fonda LABOR, MD  magnesium  oxide (MAG-OX) 400 MG tablet Take 400 mg by mouth at bedtime.    Yes [provider]  meloxicam  (MOBIC ) 15 MG tablet Take 1 tablet (15 mg total) by mouth daily. 12/10/22  Yes [name], Fonda LABOR, MD  mupirocin  ointment (BACTROBAN ) 2 % Place 1 Application into the nose 2 (two) times daily for 60 doses. Use as directed 2 times daily for 5 days  every other week for 6 weeks. 05/06/23 06/05/23 Yes Joshua Alm Hamilton, MD  omeprazole  (PRILOSEC) 40 MG capsule Take 1 capsule (40 mg total) by mouth in the morning and at bedtime. 12/10/22  Yes [name], Fonda LABOR, MD  OXYGEN  Inhale 3 L into the lungs at bedtime.   Yes [provider]  prednisoLONE  acetate (PRED FORTE ) 1 % ophthalmic suspension Place 1 drop into the left eye See admin instructions. INSTILL 1 DROP INTO LEFT EYE 4 TIMES DAILY FOR 7 DAYS THEN INSTILL 1 DROP THREE TIMES DAILY FOR 7 DAYS THEN INSTILL 1 DROP TWICE DAILY FOR 7 DAYS THEN INSTILL 1 DROP ONCE DAILY FOR 7 DAYS. START AFTER EYE SURGERY 04/08/23  Yes [provider]  SUPER B COMPLEX/C PO Take 1 capsule by mouth at bedtime.   Yes [provider]  traMADol  (ULTRAM ) 50 MG tablet Take 100 mg by mouth 4 (four) times daily as needed (pain.). 06/06/21  Yes [provider]  traZODone  (DESYREL ) 50 MG tablet Take 0.5-1 tablets (25-50 mg total) by mouth at bedtime as needed for sleep. 12/10/22  Yes [name], Fonda LABOR, MD     Critical care time: 35 mins       Lyle Pesa, MSN, AG-ACNP-BC [facility] 05/07/2023, 8:12 AM  See Amion for pager If no response to pager , please call [phone] until 7pm After 7:00 pm call Elink  [phone]

## 2023-05-07 NOTE — Discharge Summary (Signed)
 Physician Discharge Summary  Patient ID: Antonio Bowen MRN: [id number] DOB/AGE: 1945-05-14 78 y.o.  Admit date: 05/06/2023 Discharge date: 05/07/2023  Admission Diagnoses: Pseudoarthrosis L4-5 with loosening of instrumentation, back pain with radiculopathy     Discharge Diagnoses: same   Discharged Condition: good  Hospital Course: The patient was admitted on 05/06/2023 and taken to the operating room where the patient underwent TLIF. The patient tolerated the procedure well and was taken to the recovery room. Patient had a difficult time protecting his airway and was reintubated in the recovery room by anestheisa. He was admitted to the ICU and remained intubated overnight then extubated the next morning and did well. There were no complications. The wound remained clean dry and intact. Pt had appropriate back soreness. No complaints of leg pain or new N/T/W. The patient remained afebrile with stable vital signs, and tolerated a regular diet. The patient continued to increase activities, and pain was well controlled with oral pain medications.   Consults: critical care medicine  Significant Diagnostic Studies:  Results for orders placed or performed during the hospital encounter of 05/06/23  Glucose, capillary   Collection Time: 05/06/23  3:50 PM  Result Value Ref Range   Glucose-Capillary 159 (H) 70 - 99 mg/dL  I-STAT 7, (LYTES, BLD GAS, ICA, H+H)   Collection Time: 05/06/23  4:07 PM  Result Value Ref Range   pH, Arterial 7.187 (LL) 7.35 - 7.45   pCO2 arterial 56.7 (H) 32 - 48 mmHg   pO2, Arterial 105 83 - 108 mmHg   Bicarbonate 21.5 20.0 - 28.0 mmol/L   TCO2 23 22 - 32 mmol/L   O2 Saturation 96 %   Acid-base deficit 7.0 (H) 0.0 - 2.0 mmol/L   Sodium 140 135 - 145 mmol/L   Potassium 5.1 3.5 - 5.1 mmol/L   Calcium , Ion 1.23 1.15 - 1.40 mmol/L   HCT 42.0 39.0 - 52.0 %   Hemoglobin 14.3 13.0 - 17.0 g/dL   Sample type ARTERIAL   MRSA Next Gen by PCR, Nasal   Collection Time:  05/06/23  4:39 PM   Specimen: Nasal Mucosa; Nasal Swab  Result Value Ref Range   MRSA by PCR Next Gen NOT DETECTED NOT DETECTED  CBC   Collection Time: 05/06/23  4:57 PM  Result Value Ref Range   WBC 11.9 (H) 4.0 - 10.5 K/uL   RBC 4.50 4.22 - 5.81 MIL/uL   Hemoglobin 14.0 13.0 - 17.0 g/dL   HCT 57.7 60.9 - 47.9 %   MCV 93.8 80.0 - 100.0 fL   MCH 31.1 26.0 - 34.0 pg   MCHC 33.2 30.0 - 36.0 g/dL   RDW 87.0 88.4 - 84.4 %   Platelets 155 150 - 400 K/uL   nRBC 0.2 0.0 - 0.2 %  Basic metabolic panel   Collection Time: 05/06/23  4:57 PM  Result Value Ref Range   Sodium 139 135 - 145 mmol/L   Potassium 4.7 3.5 - 5.1 mmol/L   Chloride 109 98 - 111 mmol/L   CO2 21 (L) 22 - 32 mmol/L   Glucose, Bld 159 (H) 70 - 99 mg/dL   BUN 24 (H) 8 - 23 mg/dL   Creatinine, Ser 7.76 (H) 0.61 - 1.24 mg/dL   Calcium  8.5 (L) 8.9 - 10.3 mg/dL   GFR, Estimated 30 (L) >60 mL/min   Anion gap 9 5 - 15  Magnesium    Collection Time: 05/06/23  4:57 PM  Result Value Ref Range   Magnesium   1.7 1.7 - 2.4 mg/dL  Glucose, capillary   Collection Time: 05/06/23  5:14 PM  Result Value Ref Range   Glucose-Capillary 159 (H) 70 - 99 mg/dL  I-STAT 7, (LYTES, BLD GAS, ICA, H+H)   Collection Time: 05/06/23  5:15 PM  Result Value Ref Range   pH, Arterial 7.280 (L) 7.35 - 7.45   pCO2 arterial 45.9 32 - 48 mmHg   pO2, Arterial 70 (L) 83 - 108 mmHg   Bicarbonate 21.8 20.0 - 28.0 mmol/L   TCO2 23 22 - 32 mmol/L   O2 Saturation 92 %   Acid-base deficit 5.0 (H) 0.0 - 2.0 mmol/L   Sodium 139 135 - 145 mmol/L   Potassium 4.6 3.5 - 5.1 mmol/L   Calcium , Ion 1.29 1.15 - 1.40 mmol/L   HCT 40.0 39.0 - 52.0 %   Hemoglobin 13.6 13.0 - 17.0 g/dL   Patient temperature 03.1 F    Collection site RADIAL, ALLEN'S TEST ACCEPTABLE    Drawn by RT    Sample type ARTERIAL   CBC   Collection Time: 05/07/23  5:26 AM  Result Value Ref Range   WBC 12.0 (H) 4.0 - 10.5 K/uL   RBC 4.35 4.22 - 5.81 MIL/uL   Hemoglobin 13.6 13.0 - 17.0 g/dL    HCT 59.8 60.9 - 47.9 %   MCV 92.2 80.0 - 100.0 fL   MCH 31.3 26.0 - 34.0 pg   MCHC 33.9 30.0 - 36.0 g/dL   RDW 87.1 88.4 - 84.4 %   Platelets 145 (L) 150 - 400 K/uL   nRBC 0.0 0.0 - 0.2 %  Basic metabolic panel   Collection Time: 05/07/23  5:26 AM  Result Value Ref Range   Sodium 140 135 - 145 mmol/L   Potassium 4.5 3.5 - 5.1 mmol/L   Chloride 108 98 - 111 mmol/L   CO2 21 (L) 22 - 32 mmol/L   Glucose, Bld 151 (H) 70 - 99 mg/dL   BUN 29 (H) 8 - 23 mg/dL   Creatinine, Ser 7.77 (H) 0.61 - 1.24 mg/dL   Calcium  8.7 (L) 8.9 - 10.3 mg/dL   GFR, Estimated 30 (L) >60 mL/min   Anion gap 11 5 - 15  I-STAT 7, (LYTES, BLD GAS, ICA, H+H)   Collection Time: 05/07/23  5:31 AM  Result Value Ref Range   pH, Arterial 7.399 7.35 - 7.45   pCO2 arterial 37.5 32 - 48 mmHg   pO2, Arterial 85 83 - 108 mmHg   Bicarbonate 23.2 20.0 - 28.0 mmol/L   TCO2 24 22 - 32 mmol/L   O2 Saturation 97 %   Acid-base deficit 1.0 0.0 - 2.0 mmol/L   Sodium 140 135 - 145 mmol/L   Potassium 4.4 3.5 - 5.1 mmol/L   Calcium , Ion 1.23 1.15 - 1.40 mmol/L   HCT 38.0 (L) 39.0 - 52.0 %   Hemoglobin 12.9 (L) 13.0 - 17.0 g/dL   Patient temperature 01.8 F    Collection site RADIAL, ALLEN'S TEST ACCEPTABLE    Drawn by HIDE    Sample type ARTERIAL   Glucose, capillary   Collection Time: 05/07/23  5:46 AM  Result Value Ref Range   Glucose-Capillary 147 (H) 70 - 99 mg/dL  Glucose, capillary   Collection Time: 05/07/23  7:47 AM  Result Value Ref Range   Glucose-Capillary 128 (H) 70 - 99 mg/dL  Glucose, capillary   Collection Time: 05/07/23 12:13 PM  Result Value Ref Range   Glucose-Capillary 104 (  H) 70 - 99 mg/dL    DG Lumbar Spine 2-3 Views Result Date: 05/06/2023 CLINICAL DATA:  Transforaminal lumbar interbody fusion. EXAM: LUMBAR SPINE - 2-3 VIEW COMPARISON:  Lumbar spine series 11/26/2022. FINDINGS: Five intraoperative fluoroscopic spot views of the lumbar spine are provided in the frontal and lateral projections.  Initial image shows previous L4-5 posterior lumbar interbody fusion. Subsequent images show hardware revision at L4-5 with placement of an interbody spacer. Overall osseous detail is degraded by technique. IMPRESSION: Intraoperative visualization for L4-5 posterior lumbar interbody fusion hardware revision. Electronically Signed   By: Newell Eke M.D.   On: 05/06/2023 18:35   DG CHEST PORT 1 VIEW Result Date: 05/06/2023 CLINICAL DATA:  Postop for lumbar spine surgery EXAM: PORTABLE CHEST 1 VIEW COMPARISON:  08/02/2019 FINDINGS: Loop recorder projecting over the left side of the chest. Endotracheal tube terminates 3.9 cm above carina. Midline trachea. Borderline cardiomegaly, accentuated by low lung volumes and AP portable technique. Possible trace bilateral pleural fluid. No pneumothorax. Pulmonary interstitial thickening/coarsening is increased since 08/02/2019 and accentuated by low lung volumes and AP portable technique today. No lobar consolidation. Multifocal scarring including within the lateral right upper and lateral bilateral lower lobes. IMPRESSION: Appropriate position of endotracheal tube. Borderline cardiomegaly with low lung volumes and interstitial coarsening. Suspicious for pulmonary venous congestion, accentuated by AP portable technique. Possible bilateral pleural effusions. Electronically Signed   By: Rockey Kilts M.D.   On: 05/06/2023 17:10   DG C-Arm 1-60 Min-No Report Result Date: 05/06/2023 Fluoroscopy was utilized by the requesting physician.  No radiographic interpretation.   DG C-Arm 1-60 Min-No Report Result Date: 05/06/2023 Fluoroscopy was utilized by the requesting physician.  No radiographic interpretation.    Antibiotics:  Anti-infectives (From admission, onward)    Start     Dose/Rate Route Frequency Ordered Stop   05/06/23 1800  ceFAZolin  (ANCEF ) IVPB 2g/100 mL premix        2 g 200 mL/hr over 30 Minutes Intravenous Every 8 hours 05/06/23 1636 05/07/23 0213    05/06/23 0815  vancomycin  (VANCOCIN ) IVPB 1000 mg/200 mL premix        1,000 mg 200 mL/hr over 60 Minutes Intravenous On call to O.R. 05/06/23 0801 05/06/23 1649       Discharge Exam: Blood pressure (!) 114/99, pulse 77, temperature 98.2 F (36.8 C), temperature source Axillary, resp. rate (!) 21, height 5' 9 (1.753 m), weight 92 kg, SpO2 93%. Neurologic: Grossly normal Ambulating and voiding well incision cdi   Discharge Medications:   Allergies as of 05/07/2023       Reactions   Nuts Nausea And Vomiting   sweating   Other Nausea And Vomiting   Per patient, cannot take any narcotic. Nausea and vomiting sweating   Vicodin [hydrocodone-acetaminophen ]    tachycardia   Codeine Nausea And Vomiting   Aspirin  Other (See Comments)   Upset stomach, bleeding   Penicillins Hives, Rash   Has patient had a PCN reaction causing immediate rash, facial/tongue/throat swelling, SOB or lightheadedness with hypotension:No Has patient had a PCN reaction causing severe rash involving mucus membranes or skin necrosis:No Has patient had a PCN reaction that required hospitalization:No Has patient had a PCN reaction occurring within the last 10 years:Yes If all of the above answers are NO, then may proceed with Cephalosporin use.        Medication List     STOP taking these medications    clopidogrel  75 MG tablet Commonly known as: PLAVIX    meloxicam   15 MG tablet Commonly known as: MOBIC    traMADol  50 MG tablet Commonly known as: ULTRAM        TAKE these medications    acetaminophen  500 MG tablet Commonly known as: TYLENOL  Take 1,000 mg by mouth every 6 (six) hours as needed (for pain.).   albuterol  108 (90 Base) MCG/ACT inhaler Commonly known as: Ventolin  HFA Inhale 1-2 puffs into the lungs every 4 (four) hours as needed for wheezing or shortness of breath. INHALE TWO PUFFS INTO LUNGS EVERY 6 HOURS AS NEEDED FOR WHEEZING   budesonide -formoterol  160-4.5 MCG/ACT  inhaler Commonly known as: Symbicort  Inhale 2 puffs into the lungs 2 (two) times daily. in the morning and at bedtime.   cetirizine  10 MG tablet Commonly known as: ZYRTEC  Take 1 tablet (10 mg total) by mouth at bedtime.   chlorhexidine  4 % external liquid Commonly known as: HIBICLENS  Apply 15 mLs (1 Application total) topically as directed for 30 doses. Use as directed daily for 5 days every other week for 6 weeks.   fluticasone  50 MCG/ACT nasal spray Commonly known as: FLONASE  Place 1 spray into both nostrils 2 (two) times daily as needed for allergies or rhinitis. What changed: when to take this   gabapentin  300 MG capsule Commonly known as: NEURONTIN  Take 300 mg by mouth in the morning and at bedtime.   ipratropium-albuterol  0.5-2.5 (3) MG/3ML Soln Commonly known as: DUONEB Take 3 mLs by nebulization every 4 (four) hours as needed.   LEG CRAMP RELIEF PO Take 2 tablets by mouth at bedtime. [place] leg cramp   levothyroxine  137 MCG tablet Commonly known as: SYNTHROID  Take 1 tablet (137 mcg total) by mouth daily before breakfast.   losartan -hydrochlorothiazide  50-12.5 MG tablet Commonly known as: HYZAAR Take 1 tablet by mouth daily.   magnesium  oxide 400 MG tablet Commonly known as: MAG-OX Take 400 mg by mouth at bedtime.   methocarbamol  750 MG tablet Commonly known as: Robaxin -750 Take 1 tablet (750 mg total) by mouth 4 (four) times daily.   mupirocin  ointment 2 % Commonly known as: BACTROBAN  Place 1 Application into the nose 2 (two) times daily for 60 doses. Use as directed 2 times daily for 5 days every other week for 6 weeks.   omeprazole  40 MG capsule Commonly known as: PRILOSEC Take 1 capsule (40 mg total) by mouth in the morning and at bedtime.   oxyCODONE -acetaminophen  5-325 MG tablet Commonly known as: PERCOCET/ROXICET Take 1 tablet by mouth every 4 (four) hours as needed for severe pain (pain score 7-10).   OXYGEN  Inhale 3 L into the lungs at  bedtime.   prednisoLONE  acetate 1 % ophthalmic suspension Commonly known as: PRED FORTE  Place 1 drop into the left eye See admin instructions. INSTILL 1 DROP INTO LEFT EYE 4 TIMES DAILY FOR 7 DAYS THEN INSTILL 1 DROP THREE TIMES DAILY FOR 7 DAYS THEN INSTILL 1 DROP TWICE DAILY FOR 7 DAYS THEN INSTILL 1 DROP ONCE DAILY FOR 7 DAYS. START AFTER EYE SURGERY   SUPER B COMPLEX/C PO Take 1 capsule by mouth at bedtime.   traZODone  50 MG tablet Commonly known as: DESYREL  Take 0.5-1 tablets (25-50 mg total) by mouth at bedtime as needed for sleep.               Durable Medical Equipment  (From admission, onward)           Start     Ordered   05/06/23 1637  DME Walker rolling  Once  Question:  Patient needs a walker to treat with the following condition  Answer:  S/P lumbar fusion   05/06/23 1636   05/06/23 1637  DME 3 n 1  Once        05/06/23 1636           ** Restart plavix  in 5 days**  Disposition: home   Final Dx: TLIF L4-5  Discharge Instructions      Remove dressing in 72 hours   Complete by: As directed    Call MD for:  difficulty breathing, headache or visual disturbances   Complete by: As directed    Call MD for:  hives   Complete by: As directed    Call MD for:  persistant dizziness or light-headedness   Complete by: As directed    Call MD for:  persistant nausea and vomiting   Complete by: As directed    Call MD for:  redness, tenderness, or signs of infection (pain, swelling, redness, odor or green/yellow discharge around incision site)   Complete by: As directed    Call MD for:  severe uncontrolled pain   Complete by: As directed    Call MD for:  temperature >100.4   Complete by: As directed    Diet - low sodium heart healthy   Complete by: As directed    Driving Restrictions   Complete by: As directed    No driving for 2 weeks, no riding in the car for 1 week   Increase activity slowly   Complete by: As directed    Lifting restrictions    Complete by: As directed    No lifting more than 8 lbs          Signed: Suzen Lacks Allianna Beaubien 05/07/2023, 2:18 PM

## 2023-05-07 NOTE — Progress Notes (Signed)
 Subjective: Patient intubated but awake and able to communicate with hands   Objective: Vital signs in last 24 hours: Temp:  [96.4 F (35.8 C)-98.1 F (36.7 C)] 98.1 F (36.7 C) (01/07 0400) Pulse Rate:  [59-101] 63 (01/07 0700) Resp:  [15-27] 25 (01/07 0700) BP: (76-198)/(44-166) 119/77 (01/07 0700) SpO2:  [76 %-97 %] 94 % (01/07 0700) Arterial Line BP: (89-156)/(49-86) 130/58 (01/07 0700) FiO2 (%):  [60 %-100 %] 60 % (01/07 0340) Weight:  [92 kg] 92 kg (01/06 0852)  Intake/Output from previous day: 01/06 0701 - 01/07 0700 In: 2115.7 [I.V.:1215.7; IV Piggyback:900] Out: 1050 [Urine:850; Blood:200] Intake/Output this shift: No intake/output data recorded.  Neurologic: Grossly normal  Lab Results: Lab Results  Component Value Date   WBC 12.0 (H) 05/07/2023   HGB 12.9 (L) 05/07/2023   HCT 38.0 (L) 05/07/2023   MCV 92.2 05/07/2023   PLT 145 (L) 05/07/2023   Lab Results  Component Value Date   INR 1.0 04/25/2023   BMET Lab Results  Component Value Date   NA 140 05/07/2023   K 4.4 05/07/2023   CL 108 05/07/2023   CO2 21 (L) 05/07/2023   GLUCOSE 151 (H) 05/07/2023   BUN 29 (H) 05/07/2023   CREATININE 2.22 (H) 05/07/2023   CALCIUM  8.7 (L) 05/07/2023    Studies/Results: DG Lumbar Spine 2-3 Views Result Date: 05/06/2023 CLINICAL DATA:  Transforaminal lumbar interbody fusion. EXAM: LUMBAR SPINE - 2-3 VIEW COMPARISON:  Lumbar spine series 11/26/2022. FINDINGS: Five intraoperative fluoroscopic spot views of the lumbar spine are provided in the frontal and lateral projections. Initial image shows previous L4-5 posterior lumbar interbody fusion. Subsequent images show hardware revision at L4-5 with placement of an interbody spacer. Overall osseous detail is degraded by technique. IMPRESSION: Intraoperative visualization for L4-5 posterior lumbar interbody fusion hardware revision. Electronically Signed   By: Newell Eke M.D.   On: 05/06/2023 18:35   DG CHEST PORT 1  VIEW Result Date: 05/06/2023 CLINICAL DATA:  Postop for lumbar spine surgery EXAM: PORTABLE CHEST 1 VIEW COMPARISON:  08/02/2019 FINDINGS: Loop recorder projecting over the left side of the chest. Endotracheal tube terminates 3.9 cm above carina. Midline trachea. Borderline cardiomegaly, accentuated by low lung volumes and AP portable technique. Possible trace bilateral pleural fluid. No pneumothorax. Pulmonary interstitial thickening/coarsening is increased since 08/02/2019 and accentuated by low lung volumes and AP portable technique today. No lobar consolidation. Multifocal scarring including within the lateral right upper and lateral bilateral lower lobes. IMPRESSION: Appropriate position of endotracheal tube. Borderline cardiomegaly with low lung volumes and interstitial coarsening. Suspicious for pulmonary venous congestion, accentuated by AP portable technique. Possible bilateral pleural effusions. Electronically Signed   By: Rockey Kilts M.D.   On: 05/06/2023 17:10   DG C-Arm 1-60 Min-No Report Result Date: 05/06/2023 Fluoroscopy was utilized by the requesting physician.  No radiographic interpretation.   DG C-Arm 1-60 Min-No Report Result Date: 05/06/2023 Fluoroscopy was utilized by the requesting physician.  No radiographic interpretation.    Assessment/Plan: Postop day 1 TLIF with complications of hypoxia and difficulty breathing in PACU. Plan is to extubate today per CCM. Once extubated, ok to transfer to floor. Patient is eager to get home. Will defer to CCM on when they are comfortable with him discharging home from a respiratory standpoint.   LOS: 1 day    Antonio Bowen 05/07/2023, 7:51 AM

## 2023-05-08 ENCOUNTER — Encounter (HOSPITAL_COMMUNITY): Payer: Self-pay | Admitting: Neurological Surgery

## 2023-05-08 ENCOUNTER — Telehealth: Payer: Self-pay

## 2023-05-08 NOTE — Transitions of Care (Post Inpatient/ED Visit) (Signed)
 05/08/2023  Name: Antonio Bowen MRN: [id number] DOB: [birth year]  Today's TOC FU Call Status: Today's TOC FU Call Status:: Successful TOC FU Call Completed TOC FU Call Complete Date: 05/08/23 Patient's Name and Date of Birth confirmed.  Transition Care Management Follow-up Telephone Call Date of Discharge: 05/07/23 Discharge Facility: [facility]) Type of Discharge: Inpatient Admission Primary Inpatient Discharge Diagnosis:: Pseudoarthrosis L4-5 with loosening of instrumentation, back pain with radiculopathy How have you been since you were released from the hospital?: Same (lower back pain billateral leg pain) Any questions or concerns?: No  Items Reviewed: Did you receive and understand the discharge instructions provided?: Yes Medications obtained,verified, and reconciled?: Yes (Medications Reviewed) Any new allergies since your discharge?: No Dietary orders reviewed?: Yes Type of Diet Ordered:: low salt Do you have support at home?: Yes People in Home: spouse Name of Support/Comfort Primary Source: Becky  Medications Reviewed Today: Medications Reviewed Today     Reviewed by Rumalda Alan PENNER, RN (Registered Nurse) on 05/08/23 at 1023  Med List Status: <None>   Medication Order Taking? Sig Documenting Provider Last Dose Status Informant  acetaminophen  (TYLENOL ) 500 MG tablet [id number] Yes Take 1,000 mg by mouth every 6 (six) hours as needed (for pain.). [provider] Taking Active Self  albuterol  (VENTOLIN  HFA) 108 (90 Base) MCG/ACT inhaler [id number] Yes Inhale 1-2 puffs into the lungs every 4 (four) hours as needed for wheezing or shortness of breath. INHALE TWO PUFFS INTO LUNGS EVERY 6 HOURS AS NEEDED FOR WHEEZING [name], Fonda LABOR, MD Taking Active Self  budesonide -formoterol  (SYMBICORT ) 160-4.5 MCG/ACT inhaler [id number] Yes Inhale 2 puffs into the lungs 2 (two) times daily. in the morning and at bedtime. [name], Fonda LABOR, MD Taking Active Self  cetirizine   (ZYRTEC ) 10 MG tablet [id number]  Take 1 tablet (10 mg total) by mouth at bedtime. [name], Fonda LABOR, MD  Active Self  chlorhexidine  (HIBICLENS ) 4 % external liquid [id number] Yes Apply 15 mLs (1 Application total) topically as directed for 30 doses. Use as directed daily for 5 days every other week for 6 weeks. Joshua Alm Hamilton, MD Taking Active   fluticasone  (FLONASE ) 50 MCG/ACT nasal spray [id number] Yes Place 1 spray into both nostrils 2 (two) times daily as needed for allergies or rhinitis.  Patient taking differently: Place 1 spray into both nostrils in the morning and at bedtime.   [name], Fonda LABOR, MD Taking Active Self  gabapentin  (NEURONTIN ) 300 MG capsule [id number] Yes Take 300 mg by mouth in the morning and at bedtime. [provider] Taking Active Self  Homeopathic Products (LEG CRAMP RELIEF PO) [id number] Yes Take 2 tablets by mouth at bedtime. Hyland leg cramp [provider] Taking Active Self  ipratropium-albuterol  (DUONEB) 0.5-2.5 (3) MG/3ML SOLN [id number] Yes Take 3 mLs by nebulization every 4 (four) hours as needed. [name], Fonda LABOR, MD Taking Active Self  levothyroxine  (SYNTHROID ) 137 MCG tablet [id number] Yes Take 1 tablet (137 mcg total) by mouth daily before breakfast. [name], Fonda LABOR, MD Taking Active Self  losartan -hydrochlorothiazide  (HYZAAR) 50-12.5 MG tablet [id number] Yes Take 1 tablet by mouth daily. [name], Fonda LABOR, MD Taking Active Self  magnesium  oxide (MAG-OX) 400 MG tablet [id number] Yes Take 400 mg by mouth at bedtime.  [provider] Taking Active Self  methocarbamol  (ROBAXIN -750) 750 MG tablet [id number] Yes Take 1 tablet (750 mg total) by mouth 4 (four) times daily. Meyran, Suzen Lacks, NP Taking Active   methylPREDNISolone  acetate (DEPO-MEDROL ) injection 80 mg [id number]   [name],  Fonda LABOR, MD  Active   methylPREDNISolone  acetate (DEPO-MEDROL ) injection 80 mg [id number]   [name], Joshua A, MD  Active   mupirocin   ointment (BACTROBAN ) 2 % [id number] Yes Place 1 Application into the nose 2 (two) times daily for 60 doses. Use as directed 2 times daily for 5 days every other week for 6 weeks. Joshua Alm Hamilton, MD Taking Active   omeprazole  [facility]) 40 MG capsule [id number] Yes Take 1 capsule (40 mg total) by mouth in the morning and at bedtime. [name], Fonda LABOR, MD Taking Active Self  oxyCODONE -acetaminophen  (PERCOCET/ROXICET) 5-325 MG tablet [id number] Yes Take 1 tablet by mouth every 4 (four) hours as needed for severe pain (pain score 7-10). Meyran, Kimberly Hannah, NP Taking Active   OXYGEN  [id number] Yes Inhale 3 L into the lungs at bedtime. [provider] Taking Active Self  prednisoLONE  acetate (PRED FORTE ) 1 % ophthalmic suspension [id number]  Place 1 drop into the left eye See admin instructions. INSTILL 1 DROP INTO LEFT EYE 4 TIMES DAILY FOR 7 DAYS THEN INSTILL 1 DROP THREE TIMES DAILY FOR 7 DAYS THEN INSTILL 1 DROP TWICE DAILY FOR 7 DAYS THEN INSTILL 1 DROP ONCE DAILY FOR 7 DAYS. START AFTER EYE SURGERY [provider]  Active Self           Med Note CLAUD MICHEAL DASEN   Tue Apr 16, 2023 12:59 PM) Patient started regimen on 04/16/23  SUPER B COMPLEX/C PO [id number] Yes Take 1 capsule by mouth at bedtime. [provider] Taking Active Self  traZODone  (DESYREL ) 50 MG tablet [id number] Yes Take 0.5-1 tablets (25-50 mg total) by mouth at bedtime as needed for sleep. [name], Fonda LABOR, MD Taking Active Self            Home Care and Equipment/Supplies: Were Home Health Services Ordered?: No Any new equipment or medical supplies ordered?: No  Functional Questionnaire: Do you need assistance with bathing/showering or dressing?: Yes (wife) Do you need assistance with meal preparation?: Yes (wife) Do you need assistance with eating?: No Do you have difficulty maintaining continence: No Do you need assistance with getting out of bed/getting out of a chair/moving?: Yes (lift  chair) Do you have difficulty managing or taking your medications?: No  Follow up appointments reviewed: PCP Follow-up appointment confirmed?: Yes Date of PCP follow-up appointment?: 06/17/23 Follow-up Provider: PCP Specialist Hospital Follow-up appointment confirmed?: Yes Date of Specialist follow-up appointment?: 05/21/23 Follow-Up Specialty Provider:: Joshua Do you need transportation to your follow-up appointment?: No Do you understand care options if your condition(s) worsen?: Yes-patient verbalized understanding  SDOH Interventions Today    Flowsheet Row Most Recent Value  SDOH Interventions   Food Insecurity Interventions Intervention Not Indicated  Housing Interventions Intervention Not Indicated  Transportation Interventions Intervention Not Indicated  Utilities Interventions Intervention Not Indicated      Spoke with wife (On the DPR).  Wife reports that patient has been in severe pain since surgery. Patient reports pain is a 14.  Wife states that patient is not able to get comfortable.  ( Reports he is like a worm in hot ashes)  Wife reports that patient is taking his pain medications and muscle relaxer's as prescribed without relief.  Wife denies any fever. Reports dressing is dry on his back.   Reviewed with wife to call surgeon. Wife reports she and patient are able to call and do not need my assistance with this. Encouraged wife to ask about heat and or ice.   Reviewed  all restrictions on discharge papers with wife.  Wife is very knowledgeable and is assisting patient with all his needs.   Offered 30 day TOC program but wife feels like they will call MD for any concerns.  Provided my contact information and encouraged wife and patient to call me if needed.  Interventions Today    Flowsheet Row Most Recent Value  Chronic Disease   Chronic disease during today's visit Other  [Pseudoarthrosis L4-5 with loosening of instrumentation, back pain with radiculopathy]  Exercise  Interventions   Exercise Discussed/Reviewed Exercise Reviewed  [restriction reviewed]  Education Interventions   Education Provided Provided Education  [Encoruaged patient to eat when taking his medications.  Reviewed pain control,]  Provided Verbal Education On Nutrition, Medication, When to see the doctor  Pharmacy Interventions   Pharmacy Dicussed/Reviewed Medications and their functions  Safety Interventions   Safety Discussed/Reviewed Home Safety  Home Safety Assistive Devices      Alan Ee, RN, BSN, CEN Population Health- Transition of Care Team.  [facility] [phone]

## 2023-05-27 ENCOUNTER — Ambulatory Visit: Payer: Medicare HMO

## 2023-06-10 ENCOUNTER — Ambulatory Visit: Payer: Medicare HMO

## 2023-06-12 ENCOUNTER — Ambulatory Visit: Payer: Medicare HMO | Admitting: Family Medicine

## 2023-06-12 ENCOUNTER — Encounter: Payer: Self-pay | Admitting: Family Medicine

## 2023-06-12 VITALS — BP 118/85 | HR 97 | Ht 69.0 in | Wt 197.0 lb

## 2023-06-12 DIAGNOSIS — J441 Chronic obstructive pulmonary disease with (acute) exacerbation: Secondary | ICD-10-CM | POA: Diagnosis not present

## 2023-06-12 DIAGNOSIS — I10 Essential (primary) hypertension: Secondary | ICD-10-CM | POA: Diagnosis not present

## 2023-06-12 DIAGNOSIS — N1832 Chronic kidney disease, stage 3b: Secondary | ICD-10-CM

## 2023-06-12 DIAGNOSIS — E785 Hyperlipidemia, unspecified: Secondary | ICD-10-CM | POA: Diagnosis not present

## 2023-06-12 DIAGNOSIS — I129 Hypertensive chronic kidney disease with stage 1 through stage 4 chronic kidney disease, or unspecified chronic kidney disease: Secondary | ICD-10-CM | POA: Diagnosis not present

## 2023-06-12 DIAGNOSIS — E039 Hypothyroidism, unspecified: Secondary | ICD-10-CM | POA: Diagnosis not present

## 2023-06-12 LAB — LIPID PANEL

## 2023-06-12 MED ORDER — PREDNISONE 20 MG PO TABS: ORAL_TABLET | ORAL | 0 refills | Status: DC

## 2023-06-12 MED ORDER — DOXYCYCLINE HYCLATE 100 MG PO TABS: 100.0000 mg | ORAL_TABLET | Freq: Two times a day (BID) | ORAL | 0 refills | Status: DC

## 2023-06-12 MED ORDER — METHYLPREDNISOLONE ACETATE 80 MG/ML IJ SUSP
80.0000 mg | Freq: Once | INTRAMUSCULAR | Status: AC
  Administered 2023-06-12: 80 mg via INTRAMUSCULAR

## 2023-06-12 NOTE — Progress Notes (Signed)
BP 118/85   Pulse 97   Ht 5\' 9"  (1.753 m)   Wt 197 lb (89.4 kg)   SpO2 97%   BMI 29.09 kg/m    Subjective:   Patient ID: Antonio Bowen, male    DOB: [birth year], 78 y.o.   MRN: [id number]  HPI: Antonio Bowen is a 78 y.o. male presenting on 06/12/2023 for Medical Management of Chronic Issues, COPD, Hypothyroidism, Hyperlipidemia, Hypertension, and Knee Pain (bilateral)   HPI COPD Patient is coming in for COPD recheck today.  He is currently on Symbicort and albuterol and has been using albuterol multiple times a day and he still feels he is having a lot of tightness and wheezing and congestion.  He has been having this ever since he had his surgery and was intubated a month ago and was actually sent to the ICU because he did not recover well from it initially..  Hypertension Patient is currently on losartan-hydrochlorothiazide, and their blood pressure today is 118/85. Patient denies any lightheadedness or dizziness. Patient denies headaches, blurred vision, chest pains, shortness of breath, or weakness. Denies any side effects from medication and is content with current medication.   Hyperlipidemia Patient is coming in for recheck of his hyperlipidemia. The patient is currently taking no medicine currently, has been intolerant of statins. They deny any issues with myalgias or history of liver damage from it. They deny any focal numbness or weakness or chest pain.   Hypothyroidism recheck Patient is coming in for thyroid recheck today as well. They deny any issues with hair changes or heat or cold problems or diarrhea or constipation. They deny any chest pain or palpitations. They are currently on levothyroxine 137 micrograms   Bilateral knee osteoarthritis Patient has been having bilateral knee osteoarthritis and wants think about injections again versus gel injections.  Since he is having the respiratory issues so we decided to go with the injection to help with the breathing and  treat that first and then come back for the knee injections.  Relevant past medical, surgical, family and social history reviewed and updated as indicated. Interim medical history since our last visit reviewed. Allergies and medications reviewed and updated.  Review of Systems  Constitutional:  Negative for chills and fever.  HENT:  Positive for congestion.   Eyes:  Negative for visual disturbance.  Respiratory:  Positive for cough, shortness of breath and wheezing.   Cardiovascular:  Negative for chest pain and leg swelling.  Musculoskeletal:  Negative for back pain and gait problem.  Skin:  Negative for rash.  All other systems reviewed and are negative.   Per HPI unless specifically indicated above   Allergies as of 06/12/2023       Reactions   Nuts Nausea And Vomiting   sweating   Other Nausea And Vomiting   Per patient, cannot take any narcotic. Nausea and vomiting sweating   Vicodin [hydrocodone-acetaminophen]    tachycardia   Codeine Nausea And Vomiting   Aspirin Other (See Comments)   Upset stomach, bleeding   Penicillins Hives, Rash   Has patient had a PCN reaction causing immediate rash, facial/tongue/throat swelling, SOB or lightheadedness with hypotension:No Has patient had a PCN reaction causing severe rash involving mucus membranes or skin necrosis:No Has patient had a PCN reaction that required hospitalization:No Has patient had a PCN reaction occurring within the last 10 years:Yes If all of the above answers are "NO", then may proceed with Cephalosporin use.  Medication List        Accurate as of June 12, 2023  4:27 PM. If you have any questions, ask your nurse or doctor.          STOP taking these medications    gabapentin 300 MG capsule Commonly known as: NEURONTIN Stopped by: Elige Radon Avonlea Sima   oxyCODONE-acetaminophen 5-325 MG tablet Commonly known as: PERCOCET/ROXICET Stopped by: Elige Radon Machell Wirthlin       TAKE these  medications    acetaminophen 500 MG tablet Commonly known as: TYLENOL Take 1,000 mg by mouth every 6 (six) hours as needed (for pain.).   albuterol 108 (90 Base) MCG/ACT inhaler Commonly known as: Ventolin HFA Inhale 1-2 puffs into the lungs every 4 (four) hours as needed for wheezing or shortness of breath. INHALE TWO PUFFS INTO LUNGS EVERY 6 HOURS AS NEEDED FOR WHEEZING   budesonide-formoterol 160-4.5 MCG/ACT inhaler Commonly known as: Symbicort Inhale 2 puffs into the lungs 2 (two) times daily. in the morning and at bedtime.   cetirizine 10 MG tablet Commonly known as: ZYRTEC Take 1 tablet (10 mg total) by mouth at bedtime.   chlorhexidine 4 % external liquid Commonly known as: HIBICLENS Apply 15 mLs (1 Application total) topically as directed for 30 doses. Use as directed daily for 5 days every other week for 6 weeks.   clopidogrel 75 MG tablet Commonly known as: PLAVIX Take 75 mg by mouth daily.   doxycycline 100 MG tablet Commonly known as: VIBRA-TABS Take 1 tablet (100 mg total) by mouth 2 (two) times daily. 1 po bid Started by: Elige Radon Brookelin Felber   fluticasone 50 MCG/ACT nasal spray Commonly known as: FLONASE Place 1 spray into both nostrils 2 (two) times daily as needed for allergies or rhinitis. What changed: when to take this   ipratropium-albuterol 0.5-2.5 (3) MG/3ML Soln Commonly known as: DUONEB Take 3 mLs by nebulization every 4 (four) hours as needed.   LEG CRAMP RELIEF PO Take 2 tablets by mouth at bedtime. Hyland leg cramp   levothyroxine 137 MCG tablet Commonly known as: SYNTHROID Take 1 tablet (137 mcg total) by mouth daily before breakfast.   losartan-hydrochlorothiazide 50-12.5 MG tablet Commonly known as: HYZAAR Take 1 tablet by mouth daily.   magnesium oxide 400 MG tablet Commonly known as: MAG-OX Take 400 mg by mouth at bedtime.   methocarbamol 750 MG tablet Commonly known as: Robaxin-750 Take 1 tablet (750 mg total) by mouth 4 (four)  times daily.   omeprazole 40 MG capsule Commonly known as: PRILOSEC Take 1 capsule (40 mg total) by mouth in the morning and at bedtime.   OXYGEN Inhale 3 L into the lungs at bedtime.   prednisoLONE acetate 1 % ophthalmic suspension Commonly known as: PRED FORTE Place 1 drop into the left eye See admin instructions. INSTILL 1 DROP INTO LEFT EYE 4 TIMES DAILY FOR 7 DAYS THEN INSTILL 1 DROP THREE TIMES DAILY FOR 7 DAYS THEN INSTILL 1 DROP TWICE DAILY FOR 7 DAYS THEN INSTILL 1 DROP ONCE DAILY FOR 7 DAYS. START AFTER EYE SURGERY   predniSONE 20 MG tablet Commonly known as: DELTASONE 2 po at same time daily for 5 days Started by: Elige Radon Chidubem Chaires   SUPER B COMPLEX/C PO Take 1 capsule by mouth at bedtime.   traMADol 100 MG 24 hr tablet Commonly known as: ULTRAM-ER Take 100 mg by mouth daily.   traZODone 50 MG tablet Commonly known as: DESYREL Take 0.5-1 tablets (25-50 mg total) by  mouth at bedtime as needed for sleep.         Objective:   BP 118/85   Pulse 97   Ht 5\' 9"  (1.753 m)   Wt 197 lb (89.4 kg)   SpO2 97%   BMI 29.09 kg/m   Wt Readings from Last 3 Encounters:  06/12/23 197 lb (89.4 kg)  05/06/23 202 lb 13.2 oz (92 kg)  04/25/23 203 lb (92.1 kg)    Physical Exam Vitals and nursing note reviewed.  Constitutional:      General: He is not in acute distress.    Appearance: He is well-developed. He is not diaphoretic.  Eyes:     General: No scleral icterus.    Conjunctiva/sclera: Conjunctivae normal.  Neck:     Thyroid: No thyromegaly.  Cardiovascular:     Rate and Rhythm: Normal rate and regular rhythm.     Heart sounds: Normal heart sounds. No murmur heard. Pulmonary:     Effort: Pulmonary effort is normal. No respiratory distress.     Breath sounds: Wheezing present. No rhonchi or rales.  Musculoskeletal:        General: Normal range of motion.     Cervical back: Neck supple.  Lymphadenopathy:     Cervical: No cervical adenopathy.  Skin:     General: Skin is warm and dry.     Findings: No rash.  Neurological:     Mental Status: He is alert and oriented to person, place, and time.     Coordination: Coordination normal.  Psychiatric:        Behavior: Behavior normal.       Assessment & Plan:   Problem List Items Addressed This Visit       Cardiovascular and Mediastinum   Hypertension   Relevant Orders   CBC with Differential/Platelet   CMP14+EGFR   Lipid panel     Respiratory   Obstructive chronic bronchitis with exacerbation Gold B Copd - Primary   Relevant Medications   predniSONE (DELTASONE) 20 MG tablet     Endocrine   Hypothyroidism (Chronic)   Relevant Orders   CBC with Differential/Platelet   CMP14+EGFR   Lipid panel   TSH     Genitourinary   Chronic kidney disease (CKD), stage III (moderate) (HCC)   Relevant Orders   CBC with Differential/Platelet   CMP14+EGFR   Lipid panel     Other   Hyperlipidemia LDL goal <130   Relevant Orders   CBC with Differential/Platelet   CMP14+EGFR   Lipid panel   Other Visit Diagnoses       COPD exacerbation (HCC)       Relevant Medications   methylPREDNISolone acetate (DEPO-MEDROL) injection 80 mg (Completed)   doxycycline (VIBRA-TABS) 100 MG tablet   predniSONE (DELTASONE) 20 MG tablet       Depo-Medrol 80 intramuscular given to help with COPD exacerbation.  Also gave doxycycline and prednisone that he can start tomorrow.  Will reschedule for knee injections in 3 to 4 weeks.  Check blood work today. Follow up plan: Return in about 6 months (around 12/10/2023), or if symptoms worsen or fail to improve, for Thyroid and COPD and hyperlipidemia recheck.  Counseling provided for all of the vaccine components Orders Placed This Encounter  Procedures   CBC with Differential/Platelet   CMP14+EGFR   Lipid panel   TSH    Arville Care, MD [facility] 06/12/2023, 4:27 PM

## 2023-06-13 LAB — CMP14+EGFR
ALT: 16 IU/L (ref 0–44)
AST: 18 IU/L (ref 0–40)
Albumin: 4.3 g/dL (ref 3.8–4.8)
Alkaline Phosphatase: 117 IU/L (ref 44–121)
BUN/Creatinine Ratio: 10 (ref 10–24)
BUN: 23 mg/dL (ref 8–27)
Bilirubin Total: 0.3 mg/dL (ref 0.0–1.2)
CO2: 19 mmol/L — ABNORMAL LOW (ref 20–29)
Calcium: 9.2 mg/dL (ref 8.6–10.2)
Chloride: 105 mmol/L (ref 96–106)
Creatinine, Ser: 2.31 mg/dL — ABNORMAL HIGH (ref 0.76–1.27)
Globulin, Total: 2.1 g/dL (ref 1.5–4.5)
Glucose: 119 mg/dL — ABNORMAL HIGH (ref 70–99)
Potassium: 4.5 mmol/L (ref 3.5–5.2)
Sodium: 141 mmol/L (ref 134–144)
Total Protein: 6.4 g/dL (ref 6.0–8.5)
eGFR: 28 mL/min/{1.73_m2} — ABNORMAL LOW (ref >59)

## 2023-06-13 LAB — CBC WITH DIFFERENTIAL/PLATELET
Basophils Absolute: 0 10*3/uL (ref 0.0–0.2)
Basos: 1 %
EOS (ABSOLUTE): 0.1 10*3/uL (ref 0.0–0.4)
Eos: 2 %
Hematocrit: 43.9 % (ref 37.5–51.0)
Hemoglobin: 14.9 g/dL (ref 13.0–17.7)
Immature Grans (Abs): 0.1 10*3/uL (ref 0.0–0.1)
Immature Granulocytes: 2 %
Lymphocytes Absolute: 2.7 10*3/uL (ref 0.7–3.1)
Lymphs: 32 %
MCH: 31 pg (ref 26.6–33.0)
MCHC: 33.9 g/dL (ref 31.5–35.7)
MCV: 92 fL (ref 79–97)
Monocytes Absolute: 0.6 10*3/uL (ref 0.1–0.9)
Monocytes: 7 %
Neutrophils Absolute: 4.7 10*3/uL (ref 1.4–7.0)
Neutrophils: 56 %
Platelets: 217 10*3/uL (ref 150–450)
RBC: 4.8 x10E6/uL (ref 4.14–5.80)
RDW: 12.2 % (ref 11.6–15.4)
WBC: 8.2 10*3/uL (ref 3.4–10.8)

## 2023-06-13 LAB — LIPID PANEL
Cholesterol, Total: 99 mg/dL — ABNORMAL LOW (ref 100–199)
HDL: 39 mg/dL — ABNORMAL LOW (ref >39)
LDL CALC COMMENT:: 2.5 ratio (ref 0.0–5.0)
LDL Chol Calc (NIH): 33 mg/dL (ref 0–99)
Triglycerides: 159 mg/dL — ABNORMAL HIGH (ref 0–149)
VLDL Cholesterol Cal: 27 mg/dL (ref 5–40)

## 2023-06-13 LAB — TSH: TSH: 1.05 u[IU]/mL (ref 0.450–4.500)

## 2023-06-20 ENCOUNTER — Encounter: Payer: Self-pay | Admitting: Family Medicine

## 2023-06-24 DIAGNOSIS — M51362 Other intervertebral disc degeneration, lumbar region with discogenic back pain and lower extremity pain: Secondary | ICD-10-CM | POA: Diagnosis not present

## 2023-06-24 DIAGNOSIS — T402X5A Adverse effect of other opioids, initial encounter: Secondary | ICD-10-CM | POA: Diagnosis not present

## 2023-06-24 DIAGNOSIS — G90529 Complex regional pain syndrome I of unspecified lower limb: Secondary | ICD-10-CM | POA: Diagnosis not present

## 2023-06-24 DIAGNOSIS — M5416 Radiculopathy, lumbar region: Secondary | ICD-10-CM | POA: Diagnosis not present

## 2023-06-25 DIAGNOSIS — S32009K Unspecified fracture of unspecified lumbar vertebra, subsequent encounter for fracture with nonunion: Secondary | ICD-10-CM | POA: Diagnosis not present

## 2023-07-01 ENCOUNTER — Ambulatory Visit: Payer: Medicare HMO

## 2023-07-03 ENCOUNTER — Encounter: Payer: Self-pay | Admitting: Family Medicine

## 2023-07-03 ENCOUNTER — Ambulatory Visit: Payer: Medicare HMO | Admitting: Family Medicine

## 2023-07-03 VITALS — BP 135/75 | HR 105 | Ht 69.0 in | Wt 193.0 lb

## 2023-07-03 DIAGNOSIS — M170 Bilateral primary osteoarthritis of knee: Secondary | ICD-10-CM

## 2023-07-03 MED ORDER — METHYLPREDNISOLONE ACETATE 80 MG/ML IJ SUSP
80.0000 mg | Freq: Once | INTRAMUSCULAR | Status: AC
  Administered 2023-07-03: 80 mg via INTRA_ARTICULAR

## 2023-07-03 NOTE — Progress Notes (Signed)
 BP 135/75   Pulse (!) 105   Ht 5\' 9"  (1.753 m)   Wt 193 lb (87.5 kg)   SpO2 98%   BMI 28.50 kg/m    Subjective:   Patient ID: Antonio Bowen, male    DOB: 04-27-1946, 78 y.o.   MRN: [id number]  HPI: Antonio Bowen is a 78 y.o. male presenting on 07/03/2023 for Knee Pain (Bilateral. Requesting injections)   HPI OA knee, bilateral Patient has OA in both knees and is coming for injections  Relevant past medical, surgical, family and social history reviewed and updated as indicated. Interim medical history since our last visit reviewed. Allergies and medications reviewed and updated.  Review of Systems  Constitutional:  Negative for chills and fever.  Musculoskeletal:  Positive for arthralgias, gait problem and joint swelling.  Skin:  Negative for rash.  All other systems reviewed and are negative.   Per HPI unless specifically indicated above   Allergies as of 07/03/2023       Reactions   Nuts Nausea And Vomiting   sweating   Other Nausea And Vomiting   Per patient, cannot take any narcotic. Nausea and vomiting sweating   Vicodin [hydrocodone-acetaminophen]    tachycardia   Codeine Nausea And Vomiting   Aspirin Other (See Comments)   Upset stomach, bleeding   Penicillins Hives, Rash   Has patient had a PCN reaction causing immediate rash, facial/tongue/throat swelling, SOB or lightheadedness with hypotension:No Has patient had a PCN reaction causing severe rash involving mucus membranes or skin necrosis:No Has patient had a PCN reaction that required hospitalization:No Has patient had a PCN reaction occurring within the last 10 years:Yes If all of the above answers are "NO", then may proceed with Cephalosporin use.        Medication List        Accurate as of July 03, 2023  4:30 PM. If you have any questions, ask your nurse or doctor.          STOP taking these medications    predniSONE 20 MG tablet Commonly known as: DELTASONE       TAKE these  medications    acetaminophen 500 MG tablet Commonly known as: TYLENOL Take 1,000 mg by mouth every 6 (six) hours as needed (for pain.).   albuterol 108 (90 Base) MCG/ACT inhaler Commonly known as: Ventolin HFA Inhale 1-2 puffs into the lungs every 4 (four) hours as needed for wheezing or shortness of breath. INHALE TWO PUFFS INTO LUNGS EVERY 6 HOURS AS NEEDED FOR WHEEZING   budesonide-formoterol 160-4.5 MCG/ACT inhaler Commonly known as: Symbicort Inhale 2 puffs into the lungs 2 (two) times daily. in the morning and at bedtime.   cetirizine 10 MG tablet Commonly known as: ZYRTEC Take 1 tablet (10 mg total) by mouth at bedtime.   chlorhexidine 4 % external liquid Commonly known as: HIBICLENS Apply 15 mLs (1 Application total) topically as directed for 30 doses. Use as directed daily for 5 days every other week for 6 weeks.   clopidogrel 75 MG tablet Commonly known as: PLAVIX Take 75 mg by mouth daily.   doxycycline 100 MG tablet Commonly known as: VIBRA-TABS Take 1 tablet (100 mg total) by mouth 2 (two) times daily. 1 po bid   fluticasone 50 MCG/ACT nasal spray Commonly known as: FLONASE Place 1 spray into both nostrils 2 (two) times daily as needed for allergies or rhinitis. What changed: when to take this   ipratropium-albuterol 0.5-2.5 (3) MG/3ML Soln  Commonly known as: DUONEB Take 3 mLs by nebulization every 4 (four) hours as needed.   LEG CRAMP RELIEF PO Take 2 tablets by mouth at bedtime. Hyland leg cramp   levothyroxine 137 MCG tablet Commonly known as: SYNTHROID Take 1 tablet (137 mcg total) by mouth daily before breakfast.   losartan-hydrochlorothiazide 50-12.5 MG tablet Commonly known as: HYZAAR Take 1 tablet by mouth daily.   magnesium oxide 400 MG tablet Commonly known as: MAG-OX Take 400 mg by mouth at bedtime.   methocarbamol 750 MG tablet Commonly known as: Robaxin-750 Take 1 tablet (750 mg total) by mouth 4 (four) times daily.   omeprazole 40  MG capsule Commonly known as: PRILOSEC Take 1 capsule (40 mg total) by mouth in the morning and at bedtime.   OXYGEN Inhale 3 L into the lungs at bedtime.   prednisoLONE acetate 1 % ophthalmic suspension Commonly known as: PRED FORTE Place 1 drop into the left eye See admin instructions. INSTILL 1 DROP INTO LEFT EYE 4 TIMES DAILY FOR 7 DAYS THEN INSTILL 1 DROP THREE TIMES DAILY FOR 7 DAYS THEN INSTILL 1 DROP TWICE DAILY FOR 7 DAYS THEN INSTILL 1 DROP ONCE DAILY FOR 7 DAYS. START AFTER EYE SURGERY   SUPER B COMPLEX/C PO Take 1 capsule by mouth at bedtime.   traMADol 100 MG 24 hr tablet Commonly known as: ULTRAM-ER Take 100 mg by mouth daily.   traZODone 50 MG tablet Commonly known as: DESYREL Take 0.5-1 tablets (25-50 mg total) by mouth at bedtime as needed for sleep.         Objective:   BP 135/75   Pulse (!) 105   Ht 5\' 9"  (1.753 m)   Wt 193 lb (87.5 kg)   SpO2 98%   BMI 28.50 kg/m   Wt Readings from Last 3 Encounters:  07/03/23 193 lb (87.5 kg)  06/12/23 197 lb (89.4 kg)  05/06/23 202 lb 13.2 oz (92 kg)    Physical Exam Vitals and nursing note reviewed.  Constitutional:      Appearance: Normal appearance.  Musculoskeletal:     Right knee: Tenderness present over the medial joint line and lateral joint line.     Left knee: Tenderness present over the medial joint line and lateral joint line.  Neurological:     Mental Status: He is alert.     Knee injection x2: Consent form signed. Risk factors of bleeding and infection discussed with patient and patient is agreeable towards injection. Patient prepped with Betadine. Lateral approach towards injection used. Injected 80 mg of Depo-Medrol and 1 mL of 2% lidocaine. Patient tolerated procedure well and no side effects from noted. Minimal to no bleeding. Simple bandage applied after.   Assessment & Plan:   Problem List Items Addressed This Visit   None Visit Diagnoses       Primary osteoarthritis of both knees     -  Primary   Relevant Medications   methylPREDNISolone acetate (DEPO-MEDROL) injection 80 mg   methylPREDNISolone acetate (DEPO-MEDROL) injection 80 mg        Follow up plan: Return if symptoms worsen or fail to improve.  Counseling provided for all of the vaccine components No orders of the defined types were placed in this encounter.   Arville Care, MD [facility] 07/03/2023, 4:30 PM

## 2023-07-11 ENCOUNTER — Ambulatory Visit: Payer: Medicare HMO

## 2023-07-11 VITALS — Ht 69.0 in | Wt 193.0 lb

## 2023-07-11 DIAGNOSIS — Z0000 Encounter for general adult medical examination without abnormal findings: Secondary | ICD-10-CM | POA: Diagnosis not present

## 2023-07-11 NOTE — Progress Notes (Signed)
 Subjective:   Antonio Bowen is a 78 y.o. who presents for a Medicare Wellness preventive visit.  Visit Complete: Virtual I connected with  Maebelle Munroe on 07/11/23 by a audio enabled telemedicine application and verified that I am speaking with the correct person using two identifiers.  Patient Location: Home  Provider Location: Home Office  I discussed the limitations of evaluation and management by telemedicine. The patient expressed understanding and agreed to proceed.  Vital Signs: Because this visit was a virtual/telehealth visit, some criteria may be missing or patient reported. Any vitals not documented were not able to be obtained and vitals that have been documented are patient reported.  VideoDeclined- This patient declined Librarian, academic. Therefore the visit was completed with audio only.  Persons Participating in Visit: Patient.  AWV Questionnaire: No: Patient Medicare AWV questionnaire was not completed prior to this visit.  Cardiac Risk Factors include: advanced age (>71men, >47 women);dyslipidemia;hypertension;male gender     Objective:    Today's Vitals   07/11/23 1407  Weight: 193 lb (87.5 kg)  Height: 5\' 9"  (1.753 m)   Body mass index is 28.5 kg/m.     07/11/2023    2:21 PM 05/06/2023    8:52 AM 04/25/2023    2:20 PM 08/15/2022    2:20 PM 07/10/2022    3:07 PM 01/30/2022    8:22 AM 07/06/2021    4:02 PM  Advanced Directives  Does Patient Have a Medical Advance Directive? No No No No No No Yes  Type of Tax inspector;Living will  Copy of Healthcare Power of Attorney in Chart?       No - copy requested  Would patient like information on creating a medical advance directive? Yes (MAU/Ambulatory/Procedural Areas - Information given) No - Patient declined No - Patient declined No - Patient declined No - Patient declined No - Patient declined     Current Medications  (verified) Outpatient Encounter Medications as of 07/11/2023  Medication Sig   acetaminophen (TYLENOL) 500 MG tablet Take 1,000 mg by mouth every 6 (six) hours as needed (for pain.).   albuterol (VENTOLIN HFA) 108 (90 Base) MCG/ACT inhaler Inhale 1-2 puffs into the lungs every 4 (four) hours as needed for wheezing or shortness of breath. INHALE TWO PUFFS INTO LUNGS EVERY 6 HOURS AS NEEDED FOR WHEEZING   budesonide-formoterol (SYMBICORT) 160-4.5 MCG/ACT inhaler Inhale 2 puffs into the lungs 2 (two) times daily. in the morning and at bedtime.   cetirizine (ZYRTEC) 10 MG tablet Take 1 tablet (10 mg total) by mouth at bedtime.   chlorhexidine (HIBICLENS) 4 % external liquid Apply 15 mLs (1 Application total) topically as directed for 30 doses. Use as directed daily for 5 days every other week for 6 weeks.   clopidogrel (PLAVIX) 75 MG tablet Take 75 mg by mouth daily.   doxycycline (VIBRA-TABS) 100 MG tablet Take 1 tablet (100 mg total) by mouth 2 (two) times daily. 1 po bid   fluticasone (FLONASE) 50 MCG/ACT nasal spray Place 1 spray into both nostrils 2 (two) times daily as needed for allergies or rhinitis. (Patient taking differently: Place 1 spray into both nostrils in the morning and at bedtime.)   Homeopathic Products (LEG CRAMP RELIEF PO) Take 2 tablets by mouth at bedtime. [place] leg cramp   ipratropium-albuterol (DUONEB) 0.5-2.5 (3) MG/3ML SOLN Take 3 mLs by nebulization every 4 (four) hours as needed.   levothyroxine (  SYNTHROID) 137 MCG tablet Take 1 tablet (137 mcg total) by mouth daily before breakfast.   losartan-hydrochlorothiazide (HYZAAR) 50-12.5 MG tablet Take 1 tablet by mouth daily.   magnesium oxide (MAG-OX) 400 MG tablet Take 400 mg by mouth at bedtime.    methocarbamol (ROBAXIN-750) 750 MG tablet Take 1 tablet (750 mg total) by mouth 4 (four) times daily.   omeprazole (PRILOSEC) 40 MG capsule Take 1 capsule (40 mg total) by mouth in the morning and at bedtime.   OXYGEN Inhale 3 L  into the lungs at bedtime.   prednisoLONE acetate (PRED FORTE) 1 % ophthalmic suspension Place 1 drop into the left eye See admin instructions. INSTILL 1 DROP INTO LEFT EYE 4 TIMES DAILY FOR 7 DAYS THEN INSTILL 1 DROP THREE TIMES DAILY FOR 7 DAYS THEN INSTILL 1 DROP TWICE DAILY FOR 7 DAYS THEN INSTILL 1 DROP ONCE DAILY FOR 7 DAYS. START AFTER EYE SURGERY   SUPER B COMPLEX/C PO Take 1 capsule by mouth at bedtime.   traMADol (ULTRAM-ER) 100 MG 24 hr tablet Take 100 mg by mouth daily.   traZODone (DESYREL) 50 MG tablet Take 0.5-1 tablets (25-50 mg total) by mouth at bedtime as needed for sleep.   No facility-administered encounter medications on file as of 07/11/2023.    Allergies (verified) Nuts, Other, Vicodin [hydrocodone-acetaminophen], Codeine, Aspirin, and Penicillins   History: Past Medical History:  Diagnosis Date   Allergic rhinitis    Arthritis    BOOP (bronchiolitis obliterans with organizing pneumonia) (HCC)    Bronchitis    Colon polyps    Complication of anesthesia    COPD (chronic obstructive pulmonary disease) (HCC)    Dyspnea    with exertion    Dysrhythmia    skipped beats   GERD (gastroesophageal reflux disease)    History of hiatal hernia    History of kidney stones    Hypertension    Hypothyroidism    Low back pain    Neuropathy 2024   right leg   Oxygen dependent    uses 3L/[place] at nite occasional during day if needed    Pneumonia    BOOP   PONV (postoperative nausea and vomiting)    N/V with narcotics   Stroke (HCC)    hx of 2 strokes which patient did not know he had had . Weakness in left arm   Thyroid disease    Tobacco dependence    Past Surgical History:  Procedure Laterality Date   BALLOON DILATION N/A 01/30/2022   Procedure: BALLOON DILATION;  Surgeon: Hilarie Fredrickson, MD;  Location: [facility];  Service: Gastroenterology;  Laterality: N/A;   BIOPSY  08/18/2019   Procedure: BIOPSY;  Surgeon: Hilarie Fredrickson, MD;  Location: [facility];   Service: Endoscopy;;   broncoscopy      CARDIAC CATHETERIZATION  07/03/2000   carpel tunnal     left   CATARACT EXTRACTION Right 03/31/2015   CATARACT EXTRACTION Left 2024   COLONOSCOPY     x 4   DUPUYTREN CONTRACTURE RELEASE     Left   ESOPHAGOGASTRODUODENOSCOPY (EGD) WITH PROPOFOL N/A 03/26/2016   Procedure: ESOPHAGOGASTRODUODENOSCOPY (EGD) WITH PROPOFOL;  Surgeon: Hilarie Fredrickson, MD;  Location: [facility];  Service: Endoscopy;  Laterality: N/A;   ESOPHAGOGASTRODUODENOSCOPY (EGD) WITH PROPOFOL N/A 08/18/2019   Procedure: ESOPHAGOGASTRODUODENOSCOPY (EGD) WITH PROPOFOL;  Surgeon: Hilarie Fredrickson, MD;  Location: [facility];  Service: Endoscopy;  Laterality: N/A;  Possible dilation   ESOPHAGOGASTRODUODENOSCOPY (EGD) WITH PROPOFOL N/A 01/30/2022  Procedure: ESOPHAGOGASTRODUODENOSCOPY (EGD) WITH PROPOFOL;  Surgeon: Hilarie Fredrickson, MD;  Location: [facility];  Service: Gastroenterology;  Laterality: N/A;   ESOPHAGOGASTRODUODENOSCOPY ENDOSCOPY     x 3   FACIAL FRACTURE SURGERY     1970s   LAMINECTOMY WITH POSTERIOR LATERAL ARTHRODESIS LEVEL 1 Right 08/17/2022   Procedure: Right - L4-L5 hemifacetectomy, posterolateral fusion, non-segmental instrumentation L4-5;  Surgeon: Tia Alert, MD;  Location: [facility] OR;  Service: Neurosurgery;  Laterality: Right;   LOOP RECORDER INSERTION N/A 11/05/2019   Procedure: LOOP RECORDER INSERTION;  Surgeon: Hillis Range, MD;  Location: [facility];  Service: Cardiovascular;  Laterality: N/A;   MAXIMUM ACCESS (MAS) TRANSFORAMINAL LUMBAR INTERBODY FUSION (TLIF) 1 LEVEL Left 05/06/2023   Procedure: Transforaminal Lumbar Interbody Fusion - Lumbar Four - Lumbar Five - left, hardware revision - Posterior Lateral and Interbody fusion;  Surgeon: Arman Bogus, MD;  Location[facility] OR;  Service: Neurosurgery;  Laterality: Left;   OTHER SURGICAL HISTORY     facial surgery   SAVORY DILATION N/A 08/18/2019   Procedure: SAVORY DILATION;  Surgeon: Hilarie Fredrickson, MD;   Location: [facility];  Service: Endoscopy;  Laterality: N/A;   TOTAL HIP ARTHROPLASTY  01/2011   Right hip, [place]   ULNAR NERVE TRANSPOSITION     left   Family History  Problem Relation Age of Onset   Arthritis Father    Stroke Father 38   Prostate cancer Maternal Grandfather    Stomach cancer Maternal Grandmother    Cancer Paternal Grandmother    Colon cancer Other        mat 1st cousin   Cancer Mother    Healthy Daughter    Healthy Son    Pancreatic cancer Neg Hx    Esophageal cancer Neg Hx    Social History   Socioeconomic History   Marital status: Married    Spouse name: Becky   Number of children: 2   Years of education: 11   Highest education level: 11th grade  Occupational History   Occupation: Retired    Comment: [employer], 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, also worked in Materials engineer mills  Tobacco Use   Smoking status: Former    Current packs/day: 0.00    Average packs/day: 1 pack/day for 50.0 years (50.0 ttl pk-yrs)    Types: Cigarettes    Start date: 07/29/1960    Quit date: 07/30/2010    Years since quitting: 12.9   Smokeless tobacco: Current    Types: Snuff   Tobacco comments:    10-12 snuff/day (pouches)  Vaping Use   Vaping status: Never Used  Substance and Sexual Activity   Alcohol use: Not Currently    Comment: rare   Drug use: No   Sexual activity: Yes  Other Topics Concern   Not on file  Social History Narrative   Not on file   Social Drivers of Health   Financial Resource Strain: Low Risk  (07/11/2023)   Overall Financial Resource Strain (CARDIA)    Difficulty of Paying Living Expenses: Not hard at all  Food Insecurity: No Food Insecurity (07/11/2023)   Hunger Vital Sign    Worried About Running Out of Food in the Last Year: Never true    Ran Out of Food in the Last Year: Never true  Transportation Needs: No Transportation Needs (07/11/2023)   PRAPARE - Administrator, Civil Service (Medical): No    Lack of Transportation (Non-Medical):  No  Physical Activity: Inactive (07/11/2023)  Exercise Vital Sign    Days of Exercise per Week: 0 days    Minutes of Exercise per Session: 0 min  Stress: No Stress Concern Present (07/11/2023)   [employer] of Occupational Health - Occupational Stress Questionnaire    Feeling of Stress : Not at all  Social Connections: Moderately Integrated (07/11/2023)   Social Connection and Isolation Panel [NHANES]    Frequency of Communication with Friends and Family: More than three times a week    Frequency of Social Gatherings with Friends and Family: Three times a week    Attends Religious Services: Never    Active Member of Clubs or Organizations: No    Attends Engineer, structural: More than 4 times per year    Marital Status: Married    Tobacco Counseling Ready to quit: Not Answered Counseling given: Not Answered Tobacco comments: 10-12 snuff/day (pouches)    Clinical Intake:  Pre-visit preparation completed: Yes  Pain : No/denies pain     Diabetes: No  How often do you need to have someone help you when you read instructions, pamphlets, or other written materials from your doctor or pharmacy?: 1 - Never  Interpreter Needed?: No  Information entered by :: Kandis Fantasia LPN   Activities of Daily Living     07/11/2023    2:19 PM 04/25/2023    2:24 PM  In your present state of health, do you have any difficulty performing the following activities:  Hearing? 0   Vision? 0   Difficulty concentrating or making decisions? 0   Walking or climbing stairs? 0   Dressing or bathing? 0   Doing errands, shopping? 0 0  Preparing Food and eating ? N   Using the Toilet? N   In the past six months, have you accidently leaked urine? N   Do you have problems with loss of bowel control? N   Managing your Medications? N   Managing your Finances? N   Housekeeping or managing your Housekeeping? N     Patient Care Team: [name], Elige Radon, MD as PCP - General (Family  Medicine) Christell Constant, MD as PCP - Cardiology (Cardiology) Sheran Luz, MD as Consulting Physician (Physical Medicine and Rehabilitation) Conley Rolls, [facility], [place] as Referring Physician (Optometry) Francine Graven Bettina Gavia, MD as Consulting Physician (Pulmonary Disease)  Indicate any recent Medical Services you may have received from other than [facility] providers in the past year (date may be approximate).     Assessment:   This is a routine wellness examination for [place].  Hearing/Vision screen Hearing Screening - Comments:: Denies hearing difficulties   Vision Screening - Comments:: Wears rx glasses - up to date with routine eye exams with Dr. Conley Rolls     Goals Addressed   None    Depression Screen     07/11/2023    2:20 PM 07/03/2023    3:51 PM 12/10/2022    4:02 PM 08/01/2022   10:48 AM 07/10/2022    3:07 PM 02/15/2022    1:13 PM 08/14/2021    2:00 PM  PHQ 2/9 Scores  PHQ - 2 Score 0 0 0 0 0 0 0  PHQ- 9 Score      2 3    Fall Risk     07/11/2023    2:21 PM 07/03/2023    3:51 PM 07/10/2022    3:05 PM 07/07/2022    9:30 PM 02/15/2022    1:13 PM  Fall Risk   Falls in the past  year? 0 0 1 1 0  Number falls in past yr: 0  1 0   Injury with Fall? 0  1 1   Risk for fall due to : No Fall Risks  History of fall(s)    Follow up Falls prevention discussed;Education provided;Falls evaluation completed  Education provided;Falls prevention discussed      MEDICARE RISK AT HOME:  Medicare Risk at Home Any stairs in or around the home?: No If so, are there any without handrails?: No Home free of loose throw rugs in walkways, pet beds, electrical cords, etc?: Yes Adequate lighting in your home to reduce risk of falls?: Yes Life alert?: No Use of a cane, walker or w/c?: Yes Grab bars in the bathroom?: Yes Shower chair or bench in shower?: No Elevated toilet seat or a handicapped toilet?: Yes  TIMED UP AND GO:  Was the test performed?  No  Cognitive Function: 6CIT completed     08/13/2017    4:05 PM  MMSE - Mini Mental State Exam  Orientation to time 5  Orientation to Place 5  Registration 3  Attention/ Calculation 5  Recall 3  Language- name 2 objects 2  Language- repeat 1  Language- follow 3 step command 3  Language- read & follow direction 1  Write a sentence 1  Copy design 0  Total score 29        07/11/2023    2:21 PM 07/10/2022    3:08 PM 07/05/2020    3:35 PM 12/24/2018    4:14 PM  6CIT Screen  What Year? 0 points 0 points 0 points 0 points  What month? 0 points 0 points 0 points 0 points  What time? 0 points 0 points 0 points 0 points  Count back from 20 0 points 0 points 0 points 0 points  Months in reverse 0 points 0 points 0 points 0 points  Repeat phrase 0 points 0 points 0 points 0 points  Total Score 0 points 0 points 0 points 0 points    Immunizations Immunization History  Administered Date(s) Administered   Fluad Quad(high Dose 65+) 02/13/2021, 03/08/2022   Influenza Split 01/24/2012   Influenza, High Dose Seasonal PF 01/30/2018, 01/30/2018   Influenza,inj,Quad PF,6+ Mos 03/09/2013, 01/14/2014, 02/21/2015, 02/09/2016, 03/07/2017   Influenza-Unspecified 02/17/2020, 06/10/2023   Moderna Sars-Covid-2 Vaccination 06/15/2019, 07/13/2019, 02/24/2020   Pneumococcal Conjugate-13 01/14/2014   Pneumococcal Polysaccharide-23 01/24/2012   RSV,unspecified 06/10/2023   Td 07/03/2006   Tdap 07/03/2006, 01/05/2011   Zoster Recombinant(Shingrix) 08/14/2021, 03/08/2022   Zoster, Live 01/05/2011    Screening Tests Health Maintenance  Topic Date Due   DTaP/Tdap/Td (4 - Td or Tdap) 01/04/2021   COVID-19 Vaccine (4 - 2024-25 season) 12/30/2022   Lung Cancer Screening  03/20/2023   Medicare Annual Wellness (AWV)  07/10/2024   Pneumonia Vaccine 1+ Years old  Completed   INFLUENZA VACCINE  Completed   Hepatitis C Screening  Completed   Zoster Vaccines- Shingrix  Completed   HPV VACCINES  Aged Out   Colonoscopy  Discontinued    Health  Maintenance  Health Maintenance Due  Topic Date Due   DTaP/Tdap/Td (4 - Td or Tdap) 01/04/2021   COVID-19 Vaccine (4 - 2024-25 season) 12/30/2022   Lung Cancer Screening  03/20/2023   Additional Screening:  Vision Screening: Recommended annual ophthalmology exams for early detection of glaucoma and other disorders of the eye.  Dental Screening: Recommended annual dental exams for proper oral hygiene  Community Resource Referral / Chronic  Care Management: CRR required this visit?  No   CCM required this visit?  No     Plan:     I have personally reviewed and noted the following in the patient's chart:   Medical and social history Use of alcohol, tobacco or illicit drugs  Current medications and supplements including opioid prescriptions. Patient is not currently taking opioid prescriptions. Functional ability and status Nutritional status Physical activity Advanced directives List of other physicians Hospitalizations, surgeries, and ER visits in previous 12 months Vitals Screenings to include cognitive, depression, and falls Referrals and appointments  In addition, I have reviewed and discussed with patient certain preventive protocols, quality metrics, and best practice recommendations. A written personalized care plan for preventive services as well as general preventive health recommendations were provided to patient.     Kandis Fantasia [place], [place]   4/78/2956   After Visit Summary: (MyChart) Due to this being a telephonic visit, the after visit summary with patients personalized plan was offered to patient via MyChart   Notes: Nothing significant to report at this time.

## 2023-07-11 NOTE — Patient Instructions (Signed)
 Antonio Bowen , Thank you for taking time to come for your Medicare Wellness Visit. I appreciate your ongoing commitment to your health goals. Please review the following plan we discussed and let me know if I can assist you in the future.   Referrals/Orders/Follow-Ups/Clinician Recommendations: Aim for 30 minutes of exercise or brisk walking, 6-8 glasses of water, and 5 servings of fruits and vegetables each day.  This is a list of the screening recommended for you and due dates:  Health Maintenance  Topic Date Due   DTaP/Tdap/Td vaccine (4 - Td or Tdap) 01/04/2021   COVID-19 Vaccine (4 - 2024-25 season) 12/30/2022   Screening for Lung Cancer  03/20/2023   Medicare Annual Wellness Visit  07/10/2024   Pneumonia Vaccine  Completed   Flu Shot  Completed   Hepatitis C Screening  Completed   Zoster (Shingles) Vaccine  Completed   HPV Vaccine  Aged Out   Colon Cancer Screening  Discontinued    Advanced directives: (ACP Link)Information on Advanced Care Planning can be found at [facility] [place] Advance Health Care Directives Advance Health Care Directives. [email or web address]   Next Medicare Annual Wellness Visit scheduled for next year: Yes

## 2023-07-15 ENCOUNTER — Ambulatory Visit: Payer: Medicare HMO

## 2023-08-05 ENCOUNTER — Ambulatory Visit: Payer: Medicare HMO

## 2023-08-15 ENCOUNTER — Encounter: Payer: Self-pay | Admitting: Neurology

## 2023-08-19 ENCOUNTER — Ambulatory Visit: Payer: Medicare HMO

## 2023-08-19 ENCOUNTER — Ambulatory Visit: Payer: Medicare HMO | Attending: Internal Medicine | Admitting: Internal Medicine

## 2023-08-19 VITALS — BP 112/74 | HR 95 | Ht 69.0 in | Wt 191.0 lb

## 2023-08-19 DIAGNOSIS — Z01810 Encounter for preprocedural cardiovascular examination: Secondary | ICD-10-CM | POA: Diagnosis not present

## 2023-08-19 DIAGNOSIS — I491 Atrial premature depolarization: Secondary | ICD-10-CM | POA: Diagnosis not present

## 2023-08-19 DIAGNOSIS — I639 Cerebral infarction, unspecified: Secondary | ICD-10-CM

## 2023-08-19 DIAGNOSIS — I493 Ventricular premature depolarization: Secondary | ICD-10-CM

## 2023-08-19 DIAGNOSIS — I2510 Atherosclerotic heart disease of native coronary artery without angina pectoris: Secondary | ICD-10-CM | POA: Diagnosis not present

## 2023-08-19 DIAGNOSIS — Z8673 Personal history of transient ischemic attack (TIA), and cerebral infarction without residual deficits: Secondary | ICD-10-CM | POA: Diagnosis not present

## 2023-08-19 NOTE — Patient Instructions (Signed)
 Follow-Up: At [facility], you and your health needs are our priority.  As part of our continuing mission to provide you with exceptional heart care, our providers are all part of one team.  This team includes your primary Cardiologist (physician) and Advanced Practice Providers or APPs (Physician Assistants and Nurse Practitioners) who all work together to provide you with the care you need, when you need it.  Your next appointment:   1 year(s)  Provider:   Jann Melody, MD     We recommend signing up for the patient portal called "MyChart".  Sign up information is provided on this After Visit Summary.  MyChart is used to connect with patients for Virtual Visits (Telemedicine).  Patients are able to view lab/test results, encounter notes, upcoming appointments, etc.  Non-urgent messages can be sent to your provider as well.   To learn more about what you can do with MyChart, go to [email or web address].   Other Instructions       1st Floor: - Lobby - Registration  - Pharmacy  - Lab - Cafe  2nd Floor: - PV Lab - Diagnostic Testing (echo, CT, nuclear med)  3rd Floor: - Vacant  4th Floor: - TCTS (cardiothoracic surgery) - AFib Clinic - Structural Heart Clinic - Vascular Surgery  - Vascular Ultrasound  5th Floor: - HeartCare Cardiology (general and EP) - Clinical Pharmacy for coumadin, hypertension, lipid, weight-loss medications, and med management appointments    Valet parking services will be available as well.

## 2023-08-19 NOTE — Progress Notes (Signed)
 Cardiology Office Note:  .    Date:  08/19/2023  ID:  Antonio Bowen, DOB [birth year], MRN [id number] PCP: [name], Lucio Sabin, MD  [facility] Providers Cardiologist:  Jann Melody, MD     CC: One year follow up  History of Present Illness: .    Antonio COCUZZA "[name]" is a 78 year old male with COPD and coronary artery calcifications who presents for a cardiovascular follow-up.  He has a history of COPD and multiple lung diseases, with prior strokes. He underwent back surgery on May 06, 2023, and although the surgery went well, his lungs were slow to recover postoperatively. Since then, he has not experienced significant lung issues, though he occasionally has breathing difficulties without chest pain. He uses oxygen  occasionally during the day.  He has a history of coronary artery calcifications and aortic atherosclerosis, incidentally found during CT scans for his lung disease. No current chest pain, chest pressure, or other cardiac symptoms. He is on Plavix  for stroke prevention and due to the incidental plaque buildup. He also takes a blood pressure medication that includes a diuretic, which effectively controls his blood pressure. His cholesterol levels are reportedly better than expected.  He mentions experiencing a premature ventricular contraction (PVC) on a recent EKG but does not currently feel any palpitations or skipped heartbeats. He recalls having extra heartbeats and skipped beats in the past, particularly around 2016 when he had two strokes. He used to take medication to manage these symptoms when they occurred.  He reports minimal leg swelling, which he attributes to his back surgery. He has been adjusting his medication to manage this swelling, currently taking a lower dose that he finds effective.  Relevant histories: .  Social- comes with wife ROS: As per HPI.   Studies Reviewed: .   Cardiac Studies & Procedures    ______________________________________________________________________________________________     ECHOCARDIOGRAM  ECHOCARDIOGRAM COMPLETE 11/04/2019  Narrative ECHOCARDIOGRAM REPORT    Patient Name:   Antonio Bowen Date of Exam: 11/04/2019 Medical Rec #:  [id number]        Height:       69.0 in Accession #:    [id number]       Weight:       204.6 lb Date of Birth:  [birth year]         BSA:          2.086 m Patient Age:    74 years         BP:           110/97 mmHg Patient Gender: M                HR:           98 bpm. Exam Location:  Inpatient  Procedure: 2D Echo  Indications:    TIA  History:        Patient has prior history of Echocardiogram examinations, most recent 02/28/2015. COPD and Stroke; Risk Factors:Hypertension and Former Smoker. Bronchiolitis obliterans organizing pneumonia (BOOP). Dysrhythmia. tobacco dependence.  Sonographer:    Belton Boy RDCS (AE) Referring Phys: [id number] [place]   Sonographer Comments: No parasternal window, suboptimal apical window and Technically difficult study due to poor echo windows. Image acquisition challenging due to COPD, Image acquisition challenging due to respiratory motion and restriced mobility. see comments IMPRESSIONS   1. Limited echo no para sternal windows. 2. Left ventricular ejection fraction, by estimation, is 60 to 65%. The left ventricle has  normal function. The left ventricle has no regional wall motion abnormalities. Left ventricular diastolic parameters are indeterminate. 3. Right ventricular systolic function is normal. The right ventricular size is normal. 4. The mitral valve is normal in structure. not seen well mitral valve regurgitation. No evidence of mitral stenosis. 5. Tricuspid valve regurgitation not seen well. 6. The aortic valve was not well visualized. Aortic valve regurgitation not seen well. not seen well. 7. The inferior vena cava is normal in size with greater than 50% respiratory  variability, suggesting right atrial pressure of 3 mmHg.  FINDINGS Left Ventricle: Left ventricular ejection fraction, by estimation, is 60 to 65%. The left ventricle has normal function. The left ventricle has no regional wall motion abnormalities. Definity  contrast agent was given IV to delineate the left ventricular endocardial borders. The left ventricular internal cavity size was normal in size. There is no left ventricular hypertrophy. Left ventricular diastolic parameters are indeterminate.  Right Ventricle: The right ventricular size is normal. No increase in right ventricular wall thickness. Right ventricular systolic function is normal.  Left Atrium: Left atrial size was normal in size.  Right Atrium: Right atrial size was normal in size.  Pericardium: There is no evidence of pericardial effusion.  Mitral Valve: The mitral valve is normal in structure. Normal mobility of the mitral valve leaflets. Not seen well mitral valve regurgitation. No evidence of mitral valve stenosis.  Tricuspid Valve: The tricuspid valve is not well visualized. Tricuspid valve regurgitation not seen well. No evidence of tricuspid stenosis.  Aortic Valve: The aortic valve was not well visualized. Aortic valve regurgitation not seen well. Not seen well.  Pulmonic Valve: The pulmonic valve was normal in structure. Pulmonic valve regurgitation is not visualized. No evidence of pulmonic stenosis.  Aorta: The aortic root is normal in size and structure.  Venous: The inferior vena cava is normal in size with greater than 50% respiratory variability, suggesting right atrial pressure of 3 mmHg.  IAS/Shunts: The interatrial septum was not well visualized.  Additional Comments: Limited echo no para sternal windows.    Diastology LV e' medial: 5.33 cm/s   AORTIC VALVE LVOT Vmax:   57.40 cm/s LVOT Vmean:  37.600 cm/s LVOT VTI:    0.104 m   SHUNTS Systemic VTI: 0.10 m  Janelle Mediate MD Electronically  signed by Janelle Mediate MD Signature Date/Time: 11/04/2019/4:28:59 PM    Final          ______________________________________________________________________________________________      Physical Exam:    VS:  BP 112/74 (BP Location: Left Arm)   Pulse 95   Ht 5\' 9"  (1.753 m)   Wt 86.6 kg   SpO2 97%   BMI 28.21 kg/m    Wt Readings from Last 3 Encounters:  08/19/23 86.6 kg  07/11/23 87.5 kg  07/03/23 87.5 kg    Gen: no distress   Neck: No JVD Cardiac: No Rubs or Gallops, no murmur, RRR +2 radial pulses Respiratory: Clear to auscultation bilaterally, normal effort, normal  respiratory rate GI: Soft, nontender, non-distended  MS: No  edema;  moves all extremities Integument: Skin feels warm Neuro:  At time of evaluation, alert and oriented to person/place/time/situation  Psych: Normal affect, patient feels ok   ASSESSMENT AND PLAN: .    Coronary Artery Calcification and Aortic Atherosclerosis Incidental coronary artery calcifications and aortic atherosclerosis found on CT scans for lung disease. Asymptomatic with no chest pain or pressure. Blood pressure is well-controlled, and cholesterol levels are excellent. - Continue Plavix   for stroke prevention and incidental plaque management.  Premature Ventricular Contraction (PVC) Isolated PVC noted on EKG. Previously more frequent PVCs, currently asymptomatic with fewer episodes. No palpitations or arrhythmias reported.  Stroke Two strokes in 2016 and 2021. Currently on Plavix  for prevention. No new neurological symptoms. - Continue Plavix  for stroke prevention.  Chronic Obstructive Pulmonary Disease (COPD) Chronic obstructive pulmonary disease with ongoing dyspnea. Occasional oxygen  use during the day, especially post-exertion. No recent exacerbations. - Continue current management and oxygen  as needed.  Gloriann Larger, MD FASE [facility] Cardiologist [facility]  [address] [place], [address] [place],  [place] [address] [phone]  5:24 PM

## 2023-09-04 ENCOUNTER — Other Ambulatory Visit: Payer: Self-pay

## 2023-09-04 DIAGNOSIS — R202 Paresthesia of skin: Secondary | ICD-10-CM

## 2023-09-06 ENCOUNTER — Ambulatory Visit: Admitting: Neurology

## 2023-09-06 DIAGNOSIS — R202 Paresthesia of skin: Secondary | ICD-10-CM | POA: Diagnosis not present

## 2023-09-06 NOTE — Procedures (Signed)
  [facility]  [address] [place], [address]  [place], [place] [address] Tel: [phone] Fax: [phone] Test Date:  09/06/2023  Patient: Antonio Bowen DOB: [birth year] Physician: Reyna Cava, DO  Sex: Male Height: 5\' 9"  Ref Phys: Jeannette Mills, NP  ID#: [id number]   Technician:    History: This is a 78 year old man with lumbar spondylosis referred for evaluation of right leg numbness and pain.  NCV & EMG Findings: Electrodiagnostic testing of the right lower extremity was prematurely terminated at patient's request due to pain.  Findings show: Right sural and superficial peroneal sensory responses are absent. Right peroneal motor response at the extensor digitorum brevis is absent, and normal at the tibialis anterior.  Right tibial motor response is absent. Needle electrode examination was not completed due to pain.  Impression: This is an incomplete study as EMG testing was not performed due to pain; therefore, testing cannot evaluate for a lumbosacral radiculopathy.  Based on nerve conduction study, there is evidence of a sensorimotor polyneuropathy affecting the right lower extremity.    ___________________________ Reyna Cava, DO    Nerve Conduction Studies   Stim Site NR Peak (ms) Norm Peak (ms) O-P Amp (V) Norm O-P Amp  Right Sup Peroneal Anti Sensory (Ant Lat Mall)  32 C  12 cm *NR  <4.6  >3  Right Sural Anti Sensory (Lat Mall)  32 C  Calf *NR  <4.6  >3     Stim Site NR Onset (ms) Norm Onset (ms) O-P Amp (mV) Norm O-P Amp Site1 Site2 Delta-0 (ms) Dist (cm) Vel (m/s) Norm Vel (m/s)  Right Peroneal Motor (Ext Dig Brev)  32 C  Ankle *NR  <6.0  >2.5 B Fib Ankle  0.0  >40  B Fib *NR     Poplt B Fib  0.0  >40  Poplt *NR            Right Peroneal TA Motor (Tib Ant)  32 C  Fib Head    3.4 <4.5 4.1 >3 Poplit Fib Head 1.4 8.0 57 >40  Poplit    4.8 <5.7 4.1         Right Tibial Motor (Abd Hall Brev)  32 C  Ankle *NR  <6.0  >4 Knee Ankle  0.0  >40   Knee *NR                Waveforms:

## 2023-09-17 DIAGNOSIS — M5416 Radiculopathy, lumbar region: Secondary | ICD-10-CM | POA: Diagnosis not present

## 2023-09-17 DIAGNOSIS — Z6830 Body mass index (BMI) 30.0-30.9, adult: Secondary | ICD-10-CM | POA: Diagnosis not present

## 2023-09-19 DIAGNOSIS — M51362 Other intervertebral disc degeneration, lumbar region with discogenic back pain and lower extremity pain: Secondary | ICD-10-CM | POA: Diagnosis not present

## 2023-09-19 DIAGNOSIS — T402X5A Adverse effect of other opioids, initial encounter: Secondary | ICD-10-CM | POA: Diagnosis not present

## 2023-09-19 DIAGNOSIS — Z79899 Other long term (current) drug therapy: Secondary | ICD-10-CM | POA: Diagnosis not present

## 2023-09-19 DIAGNOSIS — G90529 Complex regional pain syndrome I of unspecified lower limb: Secondary | ICD-10-CM | POA: Diagnosis not present

## 2023-09-19 DIAGNOSIS — M5416 Radiculopathy, lumbar region: Secondary | ICD-10-CM | POA: Diagnosis not present

## 2023-11-21 DIAGNOSIS — I70221 Atherosclerosis of native arteries of extremities with rest pain, right leg: Secondary | ICD-10-CM | POA: Diagnosis not present

## 2023-11-21 DIAGNOSIS — Z6830 Body mass index (BMI) 30.0-30.9, adult: Secondary | ICD-10-CM | POA: Diagnosis not present

## 2023-12-04 ENCOUNTER — Other Ambulatory Visit: Payer: Self-pay

## 2023-12-04 DIAGNOSIS — I739 Peripheral vascular disease, unspecified: Secondary | ICD-10-CM

## 2023-12-12 ENCOUNTER — Ambulatory Visit: Payer: Medicare HMO | Admitting: Family Medicine

## 2023-12-12 ENCOUNTER — Encounter: Payer: Self-pay | Admitting: Family Medicine

## 2023-12-12 VITALS — BP 131/80 | HR 101 | Ht 69.0 in | Wt 201.0 lb

## 2023-12-12 DIAGNOSIS — I10 Essential (primary) hypertension: Secondary | ICD-10-CM

## 2023-12-12 DIAGNOSIS — Z23 Encounter for immunization: Secondary | ICD-10-CM

## 2023-12-12 DIAGNOSIS — E785 Hyperlipidemia, unspecified: Secondary | ICD-10-CM | POA: Diagnosis not present

## 2023-12-12 DIAGNOSIS — J441 Chronic obstructive pulmonary disease with (acute) exacerbation: Secondary | ICD-10-CM

## 2023-12-12 DIAGNOSIS — M1712 Unilateral primary osteoarthritis, left knee: Secondary | ICD-10-CM

## 2023-12-12 DIAGNOSIS — E039 Hypothyroidism, unspecified: Secondary | ICD-10-CM | POA: Diagnosis not present

## 2023-12-12 DIAGNOSIS — Z122 Encounter for screening for malignant neoplasm of respiratory organs: Secondary | ICD-10-CM

## 2023-12-12 DIAGNOSIS — N1832 Chronic kidney disease, stage 3b: Secondary | ICD-10-CM | POA: Diagnosis not present

## 2023-12-12 MED ORDER — OMEPRAZOLE 40 MG PO CPDR: 40.0000 mg | DELAYED_RELEASE_CAPSULE | Freq: Two times a day (BID) | ORAL | 3 refills | Status: AC

## 2023-12-12 MED ORDER — BUDESONIDE-FORMOTEROL FUMARATE 160-4.5 MCG/ACT IN AERO: 2.0000 | INHALATION_SPRAY | Freq: Two times a day (BID) | RESPIRATORY_TRACT | 3 refills | Status: AC

## 2023-12-12 MED ORDER — BREZTRI AEROSPHERE 160-9-4.8 MCG/ACT IN AERO: 2.0000 | INHALATION_SPRAY | Freq: Two times a day (BID) | RESPIRATORY_TRACT | Status: AC

## 2023-12-12 MED ORDER — ALBUTEROL SULFATE HFA 108 (90 BASE) MCG/ACT IN AERS: 1.0000 | INHALATION_SPRAY | RESPIRATORY_TRACT | 3 refills | Status: DC | PRN

## 2023-12-12 MED ORDER — CETIRIZINE HCL 10 MG PO TABS: 10.0000 mg | ORAL_TABLET | Freq: Every day | ORAL | 3 refills | Status: AC

## 2023-12-12 MED ORDER — METHYLPREDNISOLONE ACETATE 80 MG/ML IJ SUSP
80.0000 mg | Freq: Once | INTRAMUSCULAR | Status: AC
  Administered 2023-12-12: 80 mg via INTRA_ARTICULAR

## 2023-12-12 MED ORDER — LEVOTHYROXINE SODIUM 137 MCG PO TABS: 137.0000 ug | ORAL_TABLET | Freq: Every day | ORAL | 3 refills | Status: DC

## 2023-12-12 MED ORDER — TRAZODONE HCL 50 MG PO TABS: 25.0000 mg | ORAL_TABLET | Freq: Every evening | ORAL | 3 refills | Status: AC | PRN

## 2023-12-12 MED ORDER — FLUTICASONE PROPIONATE 50 MCG/ACT NA SUSP: 1.0000 | Freq: Two times a day (BID) | NASAL | 3 refills | Status: AC | PRN

## 2023-12-12 MED ORDER — IPRATROPIUM-ALBUTEROL 0.5-2.5 (3) MG/3ML IN SOLN: 3.0000 mL | RESPIRATORY_TRACT | 3 refills | Status: AC | PRN

## 2023-12-12 MED ORDER — LOSARTAN POTASSIUM-HCTZ 50-12.5 MG PO TABS: 1.0000 | ORAL_TABLET | Freq: Every day | ORAL | 3 refills | Status: AC

## 2023-12-12 NOTE — Addendum Note (Signed)
 Addended by: LEIGH ROSINA SAILOR on: 12/12/2023 04:26 PM   Modules accepted: Orders

## 2023-12-12 NOTE — Progress Notes (Signed)
 BP 131/80   Pulse (!) 101   Ht 5' 9 (1.753 m)   Wt 201 lb (91.2 kg)   SpO2 94%   BMI 29.68 kg/m    Subjective:   Patient ID: Antonio Bowen, male    DOB: 05-21-1945, 78 y.o.   MRN: [id number]  HPI: Antonio Bowen is a 78 y.o. male presenting on 12/12/2023 for Medical Management of Chronic Issues, Hypertension, Hypothyroidism, COPD, and Knee Pain (Left. Requesting injection)   Discussed the use of AI scribe software for clinical note transcription with the patient, who gave verbal consent to proceed.  History of Present Illness   Antonio Bowen is a 78 year old male with COPD, hypertension, hypothyroidism, hyperlipidemia, and CKD stage three who presents for a chronic medical recheck.  He has COPD Gold B and states his breathing is 'no worse'. He uses Symbicort daily and occasionally uses Breztri samples. He uses an albuterol inhaler and Duoneb nebulizer about twice a week, which he finds helpful, stating 'I can breathe better' and 'the chest don't feel as tight'.  For hypothyroidism, he is on 137 mcg of thyroid medication and reports no issues. He continues to take losartan hydrochlorothiazide. He is also on Plavix for blood thinning and reports no blood in his stool. He takes omeprazole twice a day for heartburn, which he states is effective.  He occasionally uses trazodone for sleep, which he finds effective. He reports left knee pain for about a month, describing it as popping and cracking, and states the left knee has been bothering him more recently. He mentions previous treatments for back pain, including various medications and a pen machine procedure, but had to stop due to pain. He is currently on nortriptyline, which helps with nerve pain but causes dry mouth and constipation, for which he takes Miralax. He previously tried gabapentin but had to discontinue due to severe foot swelling.          Relevant past medical, surgical, family and social history reviewed  and updated as indicated. Interim medical history since our last visit reviewed. Allergies and medications reviewed and updated.  Review of Systems  Constitutional:  Negative for chills and fever.  Eyes:  Negative for visual disturbance.  Respiratory:  Negative for shortness of breath and wheezing.   Cardiovascular:  Negative for chest pain and leg swelling.  Musculoskeletal:  Positive for arthralgias and back pain. Negative for gait problem and myalgias.  Skin:  Negative for rash.  All other systems reviewed and are negative.   Per HPI unless specifically indicated above   Allergies as of 12/12/2023       Reactions   Nuts Nausea And Vomiting   sweating   Other Nausea And Vomiting   Per patient, cannot take any narcotic. Nausea and vomiting sweating   Vicodin [hydrocodone-acetaminophen]    tachycardia   Codeine Nausea And Vomiting   Aspirin Other (See Comments)   Upset stomach, bleeding   Penicillins Hives, Rash   Has patient had a PCN reaction causing immediate rash, facial/tongue/throat swelling, SOB or lightheadedness with hypotension:No Has patient had a PCN reaction causing severe rash involving mucus membranes or skin necrosis:No Has patient had a PCN reaction that required hospitalization:No Has patient had a PCN reaction occurring within the last 10 years:Yes If all of the above answers are NO, then may proceed with Cephalosporin use.        Medication List        Accurate as of  December 12, 2023  3:58 PM. If you have any questions, ask your nurse or doctor.          acetaminophen 500 MG tablet Commonly known as: TYLENOL Take 1,000 mg by mouth every 6 (six) hours as needed (for pain.).   albuterol 108 (90 Base) MCG/ACT inhaler Commonly known as: Ventolin HFA Inhale 1-2 puffs into the lungs every 4 (four) hours as needed for wheezing or shortness of breath. INHALE TWO PUFFS INTO LUNGS EVERY 6 HOURS AS NEEDED FOR WHEEZING   budesonide-formoterol 160-4.5  MCG/ACT inhaler Commonly known as: Symbicort Inhale 2 puffs into the lungs 2 (two) times daily. in the morning and at bedtime.   cetirizine 10 MG tablet Commonly known as: ZYRTEC Take 1 tablet (10 mg total) by mouth at bedtime.   clopidogrel 75 MG tablet Commonly known as: PLAVIX Take 75 mg by mouth daily.   fluticasone 50 MCG/ACT nasal spray Commonly known as: FLONASE Place 1 spray into both nostrils 2 (two) times daily as needed for allergies or rhinitis.   ipratropium-albuterol 0.5-2.5 (3) MG/3ML Soln Commonly known as: DUONEB Take 3 mLs by nebulization every 4 (four) hours as needed.   LEG CRAMP RELIEF PO Take 2 tablets by mouth at bedtime. [place] leg cramp   levothyroxine 137 MCG tablet Commonly known as: SYNTHROID Take 1 tablet (137 mcg total) by mouth daily before breakfast.   losartan-hydrochlorothiazide 50-12.5 MG tablet Commonly known as: HYZAAR Take 1 tablet by mouth daily.   magnesium oxide 400 MG tablet Commonly known as: MAG-OX Take 400 mg by mouth at bedtime.   meloxicam 15 MG tablet Commonly known as: MOBIC Take 15 mg by mouth daily.   nortriptyline 50 MG capsule Commonly known as: PAMELOR Take 50 mg by mouth at bedtime.   omeprazole 40 MG capsule Commonly known as: PRILOSEC Take 1 capsule (40 mg total) by mouth in the morning and at bedtime.   OXYGEN Inhale 3 L into the lungs at bedtime.   SUPER B COMPLEX/C PO Take 1 capsule by mouth at bedtime.   traMADol 100 MG 24 hr tablet Commonly known as: ULTRAM-ER Take 100 mg by mouth daily.   traZODone 50 MG tablet Commonly known as: DESYREL Take 0.5-1 tablets (25-50 mg total) by mouth at bedtime as needed for sleep.         Objective:   BP 131/80   Pulse (!) 101   Ht 5' 9 (1.753 m)   Wt 201 lb (91.2 kg)   SpO2 94%   BMI 29.68 kg/m   Wt Readings from Last 3 Encounters:  12/12/23 201 lb (91.2 kg)  08/19/23 191 lb (86.6 kg)  07/11/23 193 lb (87.5 kg)    Physical Exam Physical  Exam   VITALS: BP- 131/80 NECK: Thyroid non-tender, no nodules. CHEST: Lungs clear to auscultation, no wheezing. CARDIOVASCULAR: Heart regular rate and rhythm, no murmurs. MUSCULOSKELETAL: Left knee popping and cracking.         Assessment & Plan:   Problem List Items Addressed This Visit       Cardiovascular and Mediastinum   Hypertension   Relevant Medications   losartan-hydrochlorothiazide (HYZAAR) 50-12.5 MG tablet   Other Relevant Orders   CBC with Differential/Platelet   CMP14+EGFR   Lipid panel   TSH     Respiratory   Obstructive chronic bronchitis with exacerbation Gold B Copd - Primary   Relevant Medications   albuterol (VENTOLIN HFA) 108 (90 Base) MCG/ACT inhaler   budesonide-formoterol (SYMBICORT) 160-4.5 MCG/ACT  inhaler   cetirizine (ZYRTEC) 10 MG tablet   fluticasone (FLONASE) 50 MCG/ACT nasal spray   ipratropium-albuterol (DUONEB) 0.5-2.5 (3) MG/3ML SOLN   Other Relevant Orders   CBC with Differential/Platelet   CMP14+EGFR   Lipid panel   TSH     Endocrine   Hypothyroidism (Chronic)   Relevant Medications   levothyroxine (SYNTHROID) 137 MCG tablet   Other Relevant Orders   TSH     Genitourinary   Chronic kidney disease (CKD), stage III (moderate) (HCC)   Relevant Orders   CBC with Differential/Platelet   CMP14+EGFR   Lipid panel   TSH     Other   Hyperlipidemia LDL goal <130   Relevant Medications   losartan-hydrochlorothiazide (HYZAAR) 50-12.5 MG tablet   Other Relevant Orders   Lipid panel   Other Visit Diagnoses       Encounter for screening for lung cancer       Relevant Orders   Ambulatory Referral Lung Cancer Screening [facility]     Essential hypertension       Relevant Medications   losartan-hydrochlorothiazide (HYZAAR) 50-12.5 MG tablet           COPD, GOLD B Stable COPD, GOLD B classification with improved symptoms using nebulizer. - Continue Symbicort daily, albuterol inhaler, and Duoneb nebulizer as needed,  approximately twice a week. - Increase nebulizer use if needed during summer heat and humidity.  Left knee osteoarthritis Left knee osteoarthritis with increased pain and symptoms of popping and cracking.  Chronic low back pain with neuropathic features Chronic low back pain with neuropathic features. Nortriptyline effective but causes dry mouth and constipation. Gabapentin discontinued due to foot swelling. - Continue nortriptyline at 50 mg. - Use Miralax for constipation. - Keep legs and feet warm, use wool socks. - Increase fluid intake.  Hypertension Blood pressure well-controlled at 131/80 mmHg. - Continue losartan hydrochlorothiazide regimen.  Hypothyroidism Well-managed hypothyroidism with current medication regimen. - Check thyroid function through blood work.  Chronic kidney disease stage 3 Chronic kidney disease stage 3.  Hyperlipidemia Hyperlipidemia previously managed with statin therapy, discontinued due to joint pain and sleep disturbances.  Gastroesophageal reflux disease (GERD) GERD well-controlled with omeprazole.       Follow up plan: Return in about 6 months (around 06/13/2024), or if symptoms worsen or fail to improve, for COPD and hypertension and thyroid.  Counseling provided for all of the vaccine components Orders Placed This Encounter  Procedures   CBC with Differential/Platelet   CMP14+EGFR   Lipid panel   TSH   Ambulatory Referral Lung Cancer Screening  Pulmonary    Fonda Levins, MD [facility] 12/12/2023, 3:58 PM

## 2023-12-12 NOTE — Addendum Note (Signed)
 Addended by: LEIGH ROSINA SAILOR on: 12/12/2023 04:33 PM   Modules accepted: Orders

## 2023-12-13 LAB — CBC WITH DIFFERENTIAL/PLATELET
Basophils Absolute: 0.1 x10E3/uL (ref 0.0–0.2)
Basos: 1 %
EOS (ABSOLUTE): 0.1 x10E3/uL (ref 0.0–0.4)
Eos: 1 %
Hematocrit: 47.9 % (ref 37.5–51.0)
Hemoglobin: 15.8 g/dL (ref 13.0–17.7)
Immature Grans (Abs): 0.2 x10E3/uL — ABNORMAL HIGH (ref 0.0–0.1)
Immature Granulocytes: 1 %
Lymphocytes Absolute: 3.4 x10E3/uL — ABNORMAL HIGH (ref 0.7–3.1)
Lymphs: 31 %
MCH: 31.2 pg (ref 26.6–33.0)
MCHC: 33 g/dL (ref 31.5–35.7)
MCV: 95 fL (ref 79–97)
Monocytes Absolute: 0.9 x10E3/uL (ref 0.1–0.9)
Monocytes: 8 %
Neutrophils Absolute: 6.2 x10E3/uL (ref 1.4–7.0)
Neutrophils: 58 %
Platelets: 199 x10E3/uL (ref 150–450)
RBC: 5.06 x10E6/uL (ref 4.14–5.80)
RDW: 13.3 % (ref 11.6–15.4)
WBC: 10.8 x10E3/uL (ref 3.4–10.8)

## 2023-12-13 LAB — CMP14+EGFR
ALT: 23 IU/L (ref 0–44)
AST: 23 IU/L (ref 0–40)
Albumin: 4.3 g/dL (ref 3.8–4.8)
Alkaline Phosphatase: 93 IU/L (ref 44–121)
BUN/Creatinine Ratio: 9 — ABNORMAL LOW (ref 10–24)
BUN: 23 mg/dL (ref 8–27)
Bilirubin Total: 0.3 mg/dL (ref 0.0–1.2)
CO2: 20 mmol/L (ref 20–29)
Calcium: 9.3 mg/dL (ref 8.6–10.2)
Chloride: 103 mmol/L (ref 96–106)
Creatinine, Ser: 2.58 mg/dL — ABNORMAL HIGH (ref 0.76–1.27)
Globulin, Total: 2.5 g/dL (ref 1.5–4.5)
Glucose: 78 mg/dL (ref 70–99)
Potassium: 5.1 mmol/L (ref 3.5–5.2)
Sodium: 138 mmol/L (ref 134–144)
Total Protein: 6.8 g/dL (ref 6.0–8.5)
eGFR: 25 mL/min/1.73 — ABNORMAL LOW (ref >59)

## 2023-12-13 LAB — LIPID PANEL
Chol/HDL Ratio: 2.4 ratio (ref 0.0–5.0)
Cholesterol, Total: 108 mg/dL (ref 100–199)
HDL: 45 mg/dL (ref >39)
LDL Chol Calc (NIH): 34 mg/dL (ref 0–99)
Triglycerides: 178 mg/dL — ABNORMAL HIGH (ref 0–149)
VLDL Cholesterol Cal: 29 mg/dL (ref 5–40)

## 2023-12-13 LAB — TSH: TSH: 7.17 u[IU]/mL — ABNORMAL HIGH (ref 0.450–4.500)

## 2023-12-18 ENCOUNTER — Other Ambulatory Visit: Payer: Self-pay | Admitting: Family Medicine

## 2023-12-18 DIAGNOSIS — J441 Chronic obstructive pulmonary disease with (acute) exacerbation: Secondary | ICD-10-CM

## 2023-12-18 MED ORDER — ALBUTEROL SULFATE HFA 108 (90 BASE) MCG/ACT IN AERS: 1.0000 | INHALATION_SPRAY | RESPIRATORY_TRACT | 3 refills | Status: AC | PRN

## 2023-12-18 NOTE — Progress Notes (Signed)
Sent albuterol

## 2023-12-20 ENCOUNTER — Ambulatory Visit: Payer: Self-pay | Admitting: Family Medicine

## 2023-12-24 MED ORDER — LEVOTHYROXINE SODIUM 150 MCG PO TABS: 150.0000 ug | ORAL_TABLET | Freq: Every day | ORAL | 1 refills | Status: AC

## 2024-01-09 ENCOUNTER — Ambulatory Visit (INDEPENDENT_AMBULATORY_CARE_PROVIDER_SITE_OTHER): Admitting: Vascular Surgery

## 2024-01-09 ENCOUNTER — Ambulatory Visit (HOSPITAL_COMMUNITY)
Admission: RE | Admit: 2024-01-09 | Discharge: 2024-01-09 | Disposition: A | Discharge status: Home / Self Care | Source: Ambulatory Visit | Attending: Vascular Surgery | Admitting: Vascular Surgery

## 2024-01-09 ENCOUNTER — Encounter: Payer: Self-pay | Admitting: Vascular Surgery

## 2024-01-09 VITALS — BP 114/79 | HR 89 | Temp 98.0°F | Ht 69.0 in | Wt 195.0 lb

## 2024-01-09 DIAGNOSIS — M48062 Spinal stenosis, lumbar region with neurogenic claudication: Secondary | ICD-10-CM | POA: Insufficient documentation

## 2024-01-09 DIAGNOSIS — I739 Peripheral vascular disease, unspecified: Secondary | ICD-10-CM | POA: Insufficient documentation

## 2024-01-09 LAB — VAS US ABI WITH/WO TBI
Left ABI: 1.18
Right ABI: 1.26

## 2024-01-09 NOTE — Progress Notes (Signed)
 Office Note     CC: Concern for peripheral arterial disease Requesting Provider:  Brissa Asante Alm Hamilton, MD  HPI: Antonio Bowen is a 78 y.o. ([birth year]) male presenting at the request of .[name], Fonda LABOR, MD with concern for peripheral arterial disease due to significant lower extremity pain with lying supine.  On exam, Antonio Bowen was doing well, accompanied by his wife.  He noted a long history of back pain, beginning in the 1970s.  He has had 2 back surgeries, and was referred to my office due to concern for peripheral arterial disease as a possible culprit of lower extremity pain.  Antonio Bowen states that the majority of his pain comes at night when lying in bed.  He has bilateral lower extremity paresthesias which occur which necessitate him standing for improvement.  He can only walk short distances without his walker prior to needing to stop due to debilitating back pain.  If he has his standing walker he can walk for much longer distances without any pain.  Has noted blue discoloration in his feet for a number of years, but states he did not think there was anything wrong with them.   Past Medical History:  Diagnosis Date   Allergic rhinitis    Arthritis    BOOP (bronchiolitis obliterans with organizing pneumonia) (HCC)    Bronchitis    Colon polyps    Complication of anesthesia    COPD (chronic obstructive pulmonary disease) (HCC)    Dyspnea    with exertion    Dysrhythmia    skipped beats   GERD (gastroesophageal reflux disease)    History of hiatal hernia    History of kidney stones    Hypertension    Hypothyroidism    Low back pain    Neuropathy 2024   right leg   Oxygen  dependent    uses 3L/[place] at nite occasional during day if needed    Pneumonia    BOOP   PONV (postoperative nausea and vomiting)    N/V with narcotics   Stroke (HCC)    hx of 2 strokes which patient did not know he had had . Weakness in left arm   Thyroid  disease    Tobacco dependence     Past  Surgical History:  Procedure Laterality Date   BALLOON DILATION N/A 01/30/2022   Procedure: BALLOON DILATION;  Surgeon: Abran Norleen SAILOR, MD;  Location: [facility];  Service: Gastroenterology;  Laterality: N/A;   BIOPSY  08/18/2019   Procedure: BIOPSY;  Surgeon: Abran Norleen SAILOR, MD;  Location: [facility];  Service: Endoscopy;;   broncoscopy      CARDIAC CATHETERIZATION  07/03/2000   carpel tunnal     left   CATARACT EXTRACTION Right 03/31/2015   CATARACT EXTRACTION Left 2024   COLONOSCOPY     x 4   DUPUYTREN CONTRACTURE RELEASE     Left   ESOPHAGOGASTRODUODENOSCOPY (EGD) WITH PROPOFOL  N/A 03/26/2016   Procedure: ESOPHAGOGASTRODUODENOSCOPY (EGD) WITH PROPOFOL ;  Surgeon: Norleen SAILOR Abran, MD;  Location: [facility];  Service: Endoscopy;  Laterality: N/A;   ESOPHAGOGASTRODUODENOSCOPY (EGD) WITH PROPOFOL  N/A 08/18/2019   Procedure: ESOPHAGOGASTRODUODENOSCOPY (EGD) WITH PROPOFOL ;  Surgeon: Abran Norleen SAILOR, MD;  Location: [facility];  Service: Endoscopy;  Laterality: N/A;  Possible dilation   ESOPHAGOGASTRODUODENOSCOPY (EGD) WITH PROPOFOL  N/A 01/30/2022   Procedure: ESOPHAGOGASTRODUODENOSCOPY (EGD) WITH PROPOFOL ;  Surgeon: Abran Norleen SAILOR, MD;  Location: [facility];  Service: Gastroenterology;  Laterality: N/A;   ESOPHAGOGASTRODUODENOSCOPY ENDOSCOPY     x 3  FACIAL FRACTURE SURGERY     1970s   LAMINECTOMY WITH POSTERIOR LATERAL ARTHRODESIS LEVEL 1 Right 08/17/2022   Procedure: Right - L4-L5 hemifacetectomy, posterolateral fusion, non-segmental instrumentation L4-5;  Surgeon: Lulani Bour Alm RAMAN, MD;  Location: [facility];  Service: Neurosurgery;  Laterality: Right;   LOOP RECORDER INSERTION N/A 11/05/2019   Procedure: LOOP RECORDER INSERTION;  Surgeon: Kelsie Agent, MD;  Location: [facility];  Service: Cardiovascular;  Laterality: N/A;   MAXIMUM ACCESS (MAS) TRANSFORAMINAL LUMBAR INTERBODY FUSION (TLIF) 1 LEVEL Left 05/06/2023   Procedure: Transforaminal Lumbar Interbody Fusion - Lumbar Four -  Lumbar Five - left, hardware revision - Posterior Lateral and Interbody fusion;  Surgeon: Lovenia Debruler Alm Hamilton, MD;  Location: [facility] OR;  Service: Neurosurgery;  Laterality: Left;   OTHER SURGICAL HISTORY     facial surgery   SAVORY DILATION N/A 08/18/2019   Procedure: SAVORY DILATION;  Surgeon: Abran Norleen SAILOR, MD;  Location: [facility];  Service: Endoscopy;  Laterality: N/A;   TOTAL HIP ARTHROPLASTY  01/2011   Right hip, [place]   ULNAR NERVE TRANSPOSITION     left    Social History   Socioeconomic History   Marital status: Married    Spouse name: Becky   Number of children: 2   Years of education: 11   Highest education level: 11th grade  Occupational History   Occupation: Retired    Comment: [employer], 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, also worked in [employer]  Tobacco Use   Smoking status: Former    Current packs/day: 0.00    Average packs/day: 1 pack/day for 50.0 years (50.0 ttl pk-yrs)    Types: Cigarettes    Start date: 07/29/1960    Quit date: 07/30/2010    Years since quitting: 13.4   Smokeless tobacco: Current    Types: Snuff   Tobacco comments:    10-12 snuff/day (pouches)  Vaping Use   Vaping status: Never Used  Substance and Sexual Activity   Alcohol use: Not Currently    Comment: rare   Drug use: No   Sexual activity: Yes  Other Topics Concern   Not on file  Social History Narrative   Not on file   Social Drivers of Health   Financial Resource Strain: Low Risk  (07/11/2023)   Overall Financial Resource Strain (CARDIA)    Difficulty of Paying Living Expenses: Not hard at all  Food Insecurity: No Food Insecurity (07/11/2023)   Hunger Vital Sign    Worried About Running Out of Food in the Last Year: Never true    Ran Out of Food in the Last Year: Never true  Transportation Needs: No Transportation Needs (07/11/2023)   PRAPARE - Administrator, Civil Service (Medical): No    Lack of Transportation (Non-Medical): No  Physical Activity: Inactive (07/11/2023)    Exercise Vital Sign    Days of Exercise per Week: 0 days    Minutes of Exercise per Session: 0 min  Stress: No Stress Concern Present (07/11/2023)   [employer] of Occupational Health - Occupational Stress Questionnaire    Feeling of Stress : Not at all  Social Connections: Moderately Integrated (07/11/2023)   Social Connection and Isolation Panel    Frequency of Communication with Friends and Family: More than three times a week    Frequency of Social Gatherings with Friends and Family: Three times a week    Attends Religious Services: Never    Active Member of Clubs or Organizations: No  Attends Banker Meetings: More than 4 times per year    Marital Status: Married  Catering manager Violence: Not At Risk (07/11/2023)   Humiliation, Afraid, Rape, and Kick questionnaire    Fear of Current or Ex-Partner: No    Emotionally Abused: No    Physically Abused: No    Sexually Abused: No   Family History  Problem Relation Age of Onset   Arthritis Father    Stroke Father 66   Prostate cancer Maternal Grandfather    Stomach cancer Maternal Grandmother    Cancer Paternal Grandmother    Colon cancer Other        mat 1st cousin   Cancer Mother    Healthy Daughter    Healthy Son    Pancreatic cancer Neg Hx    Esophageal cancer Neg Hx     Current Outpatient Medications  Medication Sig Dispense Refill   acetaminophen  (TYLENOL ) 500 MG tablet Take 1,000 mg by mouth every 6 (six) hours as needed (for pain.).     albuterol  (VENTOLIN  HFA) 108 (90 Base) MCG/ACT inhaler Inhale 1-2 puffs into the lungs every 4 (four) hours as needed for wheezing or shortness of breath. 18 g 3   budesonide -formoterol  (SYMBICORT ) 160-4.5 MCG/ACT inhaler Inhale 2 puffs into the lungs 2 (two) times daily. in the morning and at bedtime. 30.6 g 3   budesonide -glycopyrrolate-formoterol  (BREZTRI  AEROSPHERE) 160-9-4.8 MCG/ACT AERO inhaler Inhale 2 puffs into the lungs 2 (two) times daily.      cetirizine  (ZYRTEC ) 10 MG tablet Take 1 tablet (10 mg total) by mouth at bedtime. 90 tablet 3   clopidogrel  (PLAVIX ) 75 MG tablet Take 75 mg by mouth daily.     fluticasone  (FLONASE ) 50 MCG/ACT nasal spray Place 1 spray into both nostrils 2 (two) times daily as needed for allergies or rhinitis. 48 g 3   Homeopathic Products (LEG CRAMP RELIEF PO) Take 2 tablets by mouth at bedtime. [place] leg cramp     ipratropium-albuterol  (DUONEB) 0.5-2.5 (3) MG/3ML SOLN Take 3 mLs by nebulization every 4 (four) hours as needed. 360 mL 3   levothyroxine  (SYNTHROID ) 150 MCG tablet Take 1 tablet (150 mcg total) by mouth daily. 90 tablet 1   losartan -hydrochlorothiazide  (HYZAAR) 50-12.5 MG tablet Take 1 tablet by mouth daily. 90 tablet 3   magnesium  oxide (MAG-OX) 400 MG tablet Take 400 mg by mouth at bedtime.      meloxicam  (MOBIC ) 15 MG tablet Take 15 mg by mouth daily.     nortriptyline (PAMELOR) 50 MG capsule Take 50 mg by mouth at bedtime.     omeprazole  (PRILOSEC) 40 MG capsule Take 1 capsule (40 mg total) by mouth in the morning and at bedtime. 180 capsule 3   OXYGEN  Inhale 3 L into the lungs at bedtime.     SUPER B COMPLEX/C PO Take 1 capsule by mouth at bedtime.     traMADol  (ULTRAM ) 50 MG tablet Take 100 mg by mouth 4 (four) times daily as needed.     traMADol  (ULTRAM -ER) 100 MG 24 hr tablet Take 100 mg by mouth daily.     traZODone  (DESYREL ) 50 MG tablet Take 0.5-1 tablets (25-50 mg total) by mouth at bedtime as needed for sleep. 90 tablet 3   No current facility-administered medications for this visit.    Allergies  Allergen Reactions   Nuts Nausea And Vomiting    sweating   Other Nausea And Vomiting    Per patient, cannot take any narcotic. Nausea and  vomiting sweating   Vicodin [Hydrocodone-Acetaminophen ]     tachycardia   Codeine Nausea And Vomiting   Aspirin  Other (See Comments)    Upset stomach, bleeding   Penicillins Hives and Rash    Has patient had a PCN reaction causing immediate  rash, facial/tongue/throat swelling, SOB or lightheadedness with hypotension:No Has patient had a PCN reaction causing severe rash involving mucus membranes or skin necrosis:No Has patient had a PCN reaction that required hospitalization:No Has patient had a PCN reaction occurring within the last 10 years:Yes If all of the above answers are NO, then may proceed with Cephalosporin use.      REVIEW OF SYSTEMS:  [X]  denotes positive finding, [ ]  denotes negative finding Cardiac  Comments:  Chest pain or chest pressure:    Shortness of breath upon exertion:    Short of breath when lying flat:    Irregular heart rhythm:        Vascular    Pain in calf, thigh, or hip brought on by ambulation:    Pain in feet at night that wakes you up from your sleep:     Blood clot in your veins:    Leg swelling:         Pulmonary    Oxygen  at home:    Productive cough:     Wheezing:         Neurologic    Sudden weakness in arms or legs:     Sudden numbness in arms or legs:     Sudden onset of difficulty speaking or slurred speech:    Temporary loss of vision in one eye:     Problems with dizziness:         Gastrointestinal    Blood in stool:     Vomited blood:         Genitourinary    Burning when urinating:     Blood in urine:        Psychiatric    Major depression:         Hematologic    Bleeding problems:    Problems with blood clotting too easily:        Skin    Rashes or ulcers:        Constitutional    Fever or chills:      PHYSICAL EXAMINATION:  Vitals:   01/09/24 1518  BP: 114/79  Pulse: 89  Temp: 98 F (36.7 C)  SpO2: 93%  Weight: 195 lb (88.5 kg)  Height: 5' 9 (1.753 m)    General:  WDWN in NAD; vital signs documented above Gait: Not observed HENT: WNL, normocephalic Pulmonary: normal non-labored breathing , without wheezing Cardiac: regular HR Abdomen: soft, NT, no masses Skin: without rashes Vascular Exam/Pulses:  Right Left  Radial 2+ (normal)  2+ (normal)  Ulnar    Femoral    Popliteal    DP    PT 2+ (normal) 2+ (normal)   Extremities: without ischemic changes, without Gangrene , without cellulitis; without open wounds;  Musculoskeletal: no muscle wasting or atrophy  Neurologic: A&O X 3;  No focal weakness or paresthesias are detected Psychiatric:  The pt has Normal affect.   Non-Invasive Vascular Imaging:     ABI Findings:  +---------+------------------+-----+-----------+--------+  Right   Rt Pressure (mmHg)IndexWaveform   Comment   +---------+------------------+-----+-----------+--------+  Brachial 128                    triphasic            +---------+------------------+-----+-----------+--------+  PTA     161               1.26 triphasic            +---------+------------------+-----+-----------+--------+  DP      121               0.95 multiphasic          +---------+------------------+-----+-----------+--------+  Great Toe108               0.84                      +---------+------------------+-----+-----------+--------+   +---------+------------------+-----+---------+-------+  Left    Lt Pressure (mmHg)IndexWaveform Comment  +---------+------------------+-----+---------+-------+  Brachial 128                    triphasic         +---------+------------------+-----+---------+-------+  PTA     145               1.13 triphasic         +---------+------------------+-----+---------+-------+  DP      151               1.18 triphasic         +---------+------------------+-----+---------+-------+  Great Toe68                0.53                   +---------+------------------+-----+---------+-------+   +-------+-----------+-----------+------------+------------+  ABI/TBIToday's ABIToday's TBIPrevious ABIPrevious TBI  +-------+-----------+-----------+------------+------------+  Right 1.26       0.84                                  +-------+-----------+-----------+------------+------------+  Left  1.18       0.53                                 +-------+-----------+-----------+------------+------------+     ASSESSMENT/PLAN: ISHMEAL RORIE is a 78 y.o. male presenting with bilateral lower extremity pain, most appreciated at night.  On physical exam, he had palpable pulses in the feet.  The bluish discoloration appears to be from telangiectasias and superficial veins.  We discussed that this is okay, and does not require further workup.  ABI was reviewed, and relatively normal.  While he has a small amount of disease, this does not explain his severity of symptoms. I had a long discussion with O'Neal regarding the above, most notably that I think that his symptoms are related to his spine.  He does not have the level of arterial insufficiency needed for ischemic rest pain.  The pain when lying supine is from his back.  Furthermore, claudication symptoms from atherosclerotic disease do not improve with using a walker like they do with neurogenic claudication.  Antonio Bowen can follow-up with me as needed at this time.   Fonda FORBES Rim, MD [facility] [phone]

## 2024-01-20 ENCOUNTER — Encounter: Admitting: Surgery

## 2024-01-20 ENCOUNTER — Encounter (HOSPITAL_COMMUNITY)

## 2024-01-20 DIAGNOSIS — M5416 Radiculopathy, lumbar region: Secondary | ICD-10-CM | POA: Diagnosis not present

## 2024-01-20 DIAGNOSIS — M48061 Spinal stenosis, lumbar region without neurogenic claudication: Secondary | ICD-10-CM | POA: Diagnosis not present

## 2024-01-23 DIAGNOSIS — Z6831 Body mass index (BMI) 31.0-31.9, adult: Secondary | ICD-10-CM | POA: Diagnosis not present

## 2024-01-23 DIAGNOSIS — M5416 Radiculopathy, lumbar region: Secondary | ICD-10-CM | POA: Diagnosis not present

## 2024-04-22 ENCOUNTER — Other Ambulatory Visit: Payer: Self-pay | Admitting: Family Medicine

## 2024-04-24 ENCOUNTER — Other Ambulatory Visit: Payer: Self-pay | Admitting: Family Medicine

## 2024-05-18 ENCOUNTER — Ambulatory Visit: Admitting: Nurse Practitioner

## 2024-05-18 ENCOUNTER — Encounter: Payer: Self-pay | Admitting: Nurse Practitioner

## 2024-05-18 ENCOUNTER — Ambulatory Visit: Payer: Self-pay

## 2024-05-18 VITALS — BP 126/81 | HR 98 | Ht 69.0 in | Wt 201.0 lb

## 2024-05-18 DIAGNOSIS — S91301A Unspecified open wound, right foot, initial encounter: Secondary | ICD-10-CM

## 2024-05-18 MED ORDER — CLINDAMYCIN HCL 300 MG PO CAPS: 300.0000 mg | ORAL_CAPSULE | Freq: Three times a day (TID) | ORAL | 0 refills | Status: AC

## 2024-05-18 MED ORDER — TRIPLE ANTIBIOTIC 3.5-400-5000 EX OINT: 1.0000 | TOPICAL_OINTMENT | Freq: Every day | CUTANEOUS | 0 refills | Status: AC

## 2024-05-18 NOTE — Telephone Encounter (Signed)
 Appt made

## 2024-05-18 NOTE — Progress Notes (Signed)
 "    Subjective:  Patient ID: Antonio Bowen, male    DOB: 1946/01/17, 79 y.o.   MRN: [id number]  Patient Care Team: [name], Fonda LABOR, MD as PCP - General (Family Medicine) Santo Stanly LABOR, MD as PCP - Cardiology (Cardiology) Bonner Ade, MD as Consulting Physician (Physical Medicine and Rehabilitation) Ladora, [place], [place] as Referring Physician (Optometry) Kara Dorn NOVAK, MD as Consulting Physician (Pulmonary Disease) Meyran, Suzen Lacks, NP as Nurse Practitioner (Neurosurgery)   Chief Complaint:  severe right foot pain (X 1 day)   HPI: Antonio Bowen is a 79 y.o. male presenting on 05/18/2024 for severe right foot pain (X 1 day)   Discussed the use of AI scribe software for clinical note transcription with the patient, who gave verbal consent to proceed.  History of Present Illness Antonio Bowen is a 79 year old male who presents with an open sore on the right side of his foot.  He has an open sore on the right side of his foot, caused by using a rough file. The sore became painful to touch or walk on yesterday morning. Although the condition improved slightly by last night, he still had to walk on his heels this morning due to persistent pain.  He describes the pain as feeling like 'somebody was sticking a big pin' and rates it as a 3 out of 10 when pressure is applied, which is less than a third of what it was initially. No fever is present.  He has not been seeing a podiatrist or foot doctor for this issue. His wife usually cuts his nails, but he has not visited a professional for nail care recently. He mentions difficulty putting on socks due to past surgeries, including two back surgeries and a hip surgery.  He has a history of COPD and usually receives Breztri , but has not received it during his last two visits.      Relevant past medical, surgical, family, and social history reviewed and updated as indicated.  Allergies and medications  reviewed and updated. Data reviewed: Chart in Epic.   Past Medical History:  Diagnosis Date   Allergic rhinitis    Arthritis    BOOP (bronchiolitis obliterans with organizing pneumonia) (HCC)    Bronchitis    Clotting disorder    Colon polyps    Complication of anesthesia    COPD (chronic obstructive pulmonary disease) (HCC)    Dyspnea    with exertion    Dysrhythmia    skipped beats   GERD (gastroesophageal reflux disease)    History of hiatal hernia    History of kidney stones    Hypertension    Hypothyroidism    Low back pain    Neuropathy 2024   right leg   Oxygen  dependent    uses 3L/[place] at nite occasional during day if needed    Pneumonia    BOOP   PONV (postoperative nausea and vomiting)    N/V with narcotics   Stroke (HCC)    hx of 2 strokes which patient did not know he had had . Weakness in left arm   Thyroid  disease    Tobacco dependence     Past Surgical History:  Procedure Laterality Date   BALLOON DILATION N/A 01/30/2022   Procedure: BALLOON DILATION;  Surgeon: Abran Norleen SAILOR, MD;  Location: [facility];  Service: Gastroenterology;  Laterality: N/A;   BIOPSY  08/18/2019   Procedure: BIOPSY;  Surgeon: Abran Norleen SAILOR, MD;  Location: [facility];  Service: Endoscopy;;   broncoscopy      CARDIAC CATHETERIZATION  07/03/2000   carpel tunnal     left   CATARACT EXTRACTION Right 03/31/2015   CATARACT EXTRACTION Left 2024   COLONOSCOPY     x 4   DUPUYTREN CONTRACTURE RELEASE     Left   ESOPHAGOGASTRODUODENOSCOPY (EGD) WITH PROPOFOL  N/A 03/26/2016   Procedure: ESOPHAGOGASTRODUODENOSCOPY (EGD) WITH PROPOFOL ;  Surgeon: Norleen LOISE Kiang, MD;  Location: [facility];  Service: Endoscopy;  Laterality: N/A;   ESOPHAGOGASTRODUODENOSCOPY (EGD) WITH PROPOFOL  N/A 08/18/2019   Procedure: ESOPHAGOGASTRODUODENOSCOPY (EGD) WITH PROPOFOL ;  Surgeon: Kiang Norleen LOISE, MD;  Location: [facility];  Service: Endoscopy;  Laterality: N/A;  Possible dilation    ESOPHAGOGASTRODUODENOSCOPY (EGD) WITH PROPOFOL  N/A 01/30/2022   Procedure: ESOPHAGOGASTRODUODENOSCOPY (EGD) WITH PROPOFOL ;  Surgeon: Kiang Norleen LOISE, MD;  Location: [facility];  Service: Gastroenterology;  Laterality: N/A;   ESOPHAGOGASTRODUODENOSCOPY ENDOSCOPY     x 3   FACIAL FRACTURE SURGERY     1970s   LAMINECTOMY WITH POSTERIOR LATERAL ARTHRODESIS LEVEL 1 Right 08/17/2022   Procedure: Right - L4-L5 hemifacetectomy, posterolateral fusion, non-segmental instrumentation L4-5;  Surgeon: Joshua Alm RAMAN, MD;  Location: [facility];  Service: Neurosurgery;  Laterality: Right;   LOOP RECORDER INSERTION N/A 11/05/2019   Procedure: LOOP RECORDER INSERTION;  Surgeon: Kelsie Agent, MD;  Location: [facility];  Service: Cardiovascular;  Laterality: N/A;   MAXIMUM ACCESS (MAS) TRANSFORAMINAL LUMBAR INTERBODY FUSION (TLIF) 1 LEVEL Left 05/06/2023   Procedure: Transforaminal Lumbar Interbody Fusion - Lumbar Four - Lumbar Five - left, hardware revision - Posterior Lateral and Interbody fusion;  Surgeon: Joshua Alm Hamilton, MD;  Location: [facility];  Service: Neurosurgery;  Laterality: Left;   OTHER SURGICAL HISTORY     facial surgery   SAVORY DILATION N/A 08/18/2019   Procedure: SAVORY DILATION;  Surgeon: Kiang Norleen LOISE, MD;  Location: [facility];  Service: Endoscopy;  Laterality: N/A;   TOTAL HIP ARTHROPLASTY  01/2011   Right hip, [place]   ULNAR NERVE TRANSPOSITION     left    Social History   Socioeconomic History   Marital status: Married    Spouse name: Becky   Number of children: 2   Years of education: 11   Highest education level: 11th grade  Occupational History   Occupation: Retired    Comment: [employer], 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, also worked in [employer]  Tobacco Use   Smoking status: Former    Current packs/day: 0.00    Average packs/day: 1 pack/day for 50.0 years (50.0 ttl pk-yrs)    Types: Cigarettes    Start date: 07/29/1960    Quit date: 07/30/2010    Years since quitting: 13.8    Smokeless tobacco: Current    Types: Snuff   Tobacco comments:    10-12 snuff/day (pouches)  Vaping Use   Vaping status: Never Used  Substance and Sexual Activity   Alcohol use: Not Currently    Comment: rare   Drug use: No   Sexual activity: Yes  Other Topics Concern   Not on file  Social History Narrative   Not on file   Social Drivers of Health   Tobacco Use: High Risk (05/18/2024)   Patient History    Smoking Tobacco Use: Former    Smokeless Tobacco Use: Current    Passive Exposure: Not on file  Financial Resource Strain: Low Risk (07/11/2023)   Overall Financial Resource Strain (CARDIA)    Difficulty  of Paying Living Expenses: Not hard at all  Food Insecurity: No Food Insecurity (07/11/2023)   Hunger Vital Sign    Worried About Running Out of Food in the Last Year: Never true    Ran Out of Food in the Last Year: Never true  Transportation Needs: No Transportation Needs (07/11/2023)   PRAPARE - Administrator, Civil Service (Medical): No    Lack of Transportation (Non-Medical): No  Physical Activity: Inactive (07/11/2023)   Exercise Vital Sign    Days of Exercise per Week: 0 days    Minutes of Exercise per Session: 0 min  Stress: No Stress Concern Present (07/11/2023)   [employer] of Occupational Health - Occupational Stress Questionnaire    Feeling of Stress : Not at all  Social Connections: Moderately Integrated (07/11/2023)   Social Connection and Isolation Panel    Frequency of Communication with Friends and Family: More than three times a week    Frequency of Social Gatherings with Friends and Family: Three times a week    Attends Religious Services: Never    Active Member of Clubs or Organizations: No    Attends Engineer, Structural: More than 4 times per year    Marital Status: Married  Catering Manager Violence: Not At Risk (07/11/2023)   Humiliation, Afraid, Rape, and Kick questionnaire    Fear of Current or Ex-Partner: No     Emotionally Abused: No    Physically Abused: No    Sexually Abused: No  Depression (PHQ2-9): Low Risk (05/18/2024)   Depression (PHQ2-9)    PHQ-2 Score: 3  Alcohol Screen: Low Risk (07/11/2023)   Alcohol Screen    Last Alcohol Screening Score (AUDIT): 0  Housing: Unknown (07/11/2023)   Housing Stability Vital Sign    Unable to Pay for Housing in the Last Year: No    Number of Times Moved in the Last Year: Not on file    Homeless in the Last Year: No  Recent Concern: Housing - High Risk (05/08/2023)   Housing Stability Vital Sign    Unable to Pay for Housing in the Last Year: Yes    Number of Times Moved in the Last Year: Not on file    Homeless in the Last Year: Yes  Utilities: Not At Risk (07/11/2023)   AHC Utilities    Threatened with loss of utilities: No  Health Literacy: Adequate Health Literacy (07/11/2023)   B1300 Health Literacy    Frequency of need for help with medical instructions: Never    Outpatient Encounter Medications as of 05/18/2024  Medication Sig   acetaminophen  (TYLENOL ) 500 MG tablet Take 1,000 mg by mouth every 6 (six) hours as needed (for pain.).   albuterol  (VENTOLIN  HFA) 108 (90 Base) MCG/ACT inhaler Inhale 1-2 puffs into the lungs every 4 (four) hours as needed for wheezing or shortness of breath.   budesonide -formoterol  (SYMBICORT ) 160-4.5 MCG/ACT inhaler Inhale 2 puffs into the lungs 2 (two) times daily. in the morning and at bedtime.   budesonide -glycopyrrolate-formoterol  (BREZTRI  AEROSPHERE) 160-9-4.8 MCG/ACT AERO inhaler Inhale 2 puffs into the lungs 2 (two) times daily.   cetirizine  (ZYRTEC ) 10 MG tablet Take 1 tablet (10 mg total) by mouth at bedtime.   clindamycin  (CLEOCIN ) 300 MG capsule Take 1 capsule (300 mg total) by mouth 3 (three) times daily.   clopidogrel  (PLAVIX ) 75 MG tablet TAKE 1 TABLET EVERY DAY   fluticasone  (FLONASE ) 50 MCG/ACT nasal spray Place 1 spray into both nostrils 2 (two) times  daily as needed for allergies or rhinitis.    Homeopathic Products (LEG CRAMP RELIEF PO) Take 2 tablets by mouth at bedtime. [place] leg cramp   ipratropium-albuterol  (DUONEB) 0.5-2.5 (3) MG/3ML SOLN Take 3 mLs by nebulization every 4 (four) hours as needed.   levothyroxine  (SYNTHROID ) 150 MCG tablet Take 1 tablet (150 mcg total) by mouth daily.   losartan -hydrochlorothiazide  (HYZAAR) 50-12.5 MG tablet Take 1 tablet by mouth daily.   magnesium  oxide (MAG-OX) 400 MG tablet Take 400 mg by mouth at bedtime.    meloxicam  (MOBIC ) 15 MG tablet TAKE 1 TABLET EVERY DAY   neomycin-bacitracin-polymyxin [phone] OINT Apply 1 Application topically daily.   nortriptyline (PAMELOR) 50 MG capsule Take 50 mg by mouth at bedtime.   omeprazole  (PRILOSEC) 40 MG capsule Take 1 capsule (40 mg total) by mouth in the morning and at bedtime.   OXYGEN  Inhale 3 L into the lungs at bedtime.   SUPER B COMPLEX/C PO Take 1 capsule by mouth at bedtime.   traMADol  (ULTRAM ) 50 MG tablet Take 100 mg by mouth 4 (four) times daily as needed.   traMADol  (ULTRAM -ER) 100 MG 24 hr tablet Take 100 mg by mouth daily.   traZODone  (DESYREL ) 50 MG tablet Take 0.5-1 tablets (25-50 mg total) by mouth at bedtime as needed for sleep.   No facility-administered encounter medications on file as of 05/18/2024.    Allergies[1]  Pertinent ROS per HPI, otherwise unremarkable      Objective:  BP 126/81   Pulse 98   Ht 5' 9 (1.753 m)   Wt 201 lb (91.2 kg)   SpO2 94%   BMI 29.68 kg/m    Wt Readings from Last 3 Encounters:  05/18/24 201 lb (91.2 kg)  01/09/24 195 lb (88.5 kg)  12/12/23 201 lb (91.2 kg)    Physical Exam Vitals and nursing note reviewed.  Constitutional:      General: He is not in acute distress. HENT:     Head: Normocephalic and atraumatic.     Nose: Nose normal.     Mouth/Throat:     Mouth: Mucous membranes are moist.  Eyes:     General: No scleral icterus.    Extraocular Movements: Extraocular movements intact.     Conjunctiva/sclera: Conjunctivae  normal.     Pupils: Pupils are equal, round, and reactive to light.  Cardiovascular:     Heart sounds: Normal heart sounds.  Pulmonary:     Effort: Pulmonary effort is normal.     Breath sounds: Normal breath sounds.  Musculoskeletal:        General: Normal range of motion.     Right lower leg: No edema.     Left lower leg: No edema.  Skin:    General: Skin is warm and dry.     Findings: Wound present. No rash.         Comments: Right foot  Neurological:     Mental Status: He is alert and oriented to person, place, and time.  Psychiatric:        Mood and Affect: Mood normal.        Behavior: Behavior normal.        Thought Content: Thought content normal.        Judgment: Judgment normal.    Physical Exam EXTREMITIES: Open sore on the right foot.     Results for orders placed or performed during the hospital encounter of 01/09/24  VAS US  ABI WITH/WO TBI   Collection Time: 01/09/24  3:10 PM  Result Value Ref Range   Right ABI 1.26    Left ABI 1.18        Pertinent labs & imaging results that were available during my care of the patient were reviewed by me and considered in my medical decision making.  Assessment & Plan:  Rowen Wilmer was seen today for severe right foot pain.  Diagnoses and all orders for this visit:  Wound of right foot -     Anaerobic and Aerobic Culture -     WOUND CULTURE -     clindamycin  (CLEOCIN ) 300 MG capsule; Take 1 capsule (300 mg total) by mouth 3 (three) times daily.  Other orders -     neomycin-bacitracin-polymyxin [phone] OINT; Apply 1 Application topically daily.     Assessment and Plan Muhamed is a 79 year old Caucasian male seen today for chronic disease management, no acute distress  Assessment & Plan Open wound of right foot Acute open wound, likely self-inflicted. Pain 3/10. No fever. Possible infection, no foreign body. - Ordered wound culture. - Prescribed oral antibiotic, one tablet TID with food for 7  days. - Prescribed topical antibiotic ointment. - Advised to keep dressing for 24 hours, avoid wetting. - Referred to podiatry for nail care.  Chronic obstructive pulmonary disease COPD managed with Breztri . - Provided Breztri  sample      Continue all other maintenance medications.  Follow up plan: Return if symptoms worsen or fail to improve.   Continue healthy lifestyle choices, including diet (rich in fruits, vegetables, and lean proteins, and low in salt and simple carbohydrates) and exercise (at least 30 minutes of moderate physical activity daily).  Educational handout given for   Clinical References  Wound Infection A wound infection happens when germs start to grow in a wound. Infection can cause the wound to break open or get worse. Wound infections need treatment. If a wound infection is not treated, serious problems can happen. These problems could include getting an infection in your blood (septicemia) or bones (osteomyelitis). What are the causes? A wound infection is most often caused by bacteria growing in a wound. Other germs, like yeast and fungi, can also cause wound infections. What increases the risk? The following factors may make you more likely to develop a wound infection: A weak body defense system (immune system). Diabetes. Taking steroid medicines for a long time (chronic use). Smoking. Being an older adult. Obesity. Taking chemotherapy medicines. Poor nutrition. What are the signs or symptoms? Symptoms of a wound infection include: More redness, swelling, or pain at the wound site. More blood or fluid at the wound site. A bad smell coming from a wound or bandage (dressing). Fever. Feeling tired (fatigued). Warmth at or around the wound. Pus at the wound site. How is this diagnosed? A wound infection is diagnosed based on your symptoms, medical history, and physical exam. You may also have a wound culture, blood tests, or both. How is this  treated? This condition is usually treated with antibiotics and medicines that lower inflammation. The infection should get better in 24-48 hours after starting antibiotics. After 24-48 hours, redness around the wound should stop spreading. The wound should also be less painful. If the condition is severe, you may need to stay at the hospital and get antibiotics through an IV. Follow these instructions at home: Medicines Take or apply over-the-counter and prescription medicines only as told by your health care provider. If you were prescribed antibiotics, take or apply them as told  by your provider. Do not stop using the antibiotic even if you start to feel better. Wound care  Clean the wound each day or as told by your provider. Wash the wound with mild soap and water. Rinse the wound with water to remove all soap. Pat the wound dry with a clean towel. Do not rub it. Follow instructions from your provider about how to take care of your wound. Make sure you: Wash your hands with soap and water for at least 20 seconds before and after you change your dressing. If soap and water are not available, use hand sanitizer. Change your dressing as told by your provider. Leave stitches (sutures), skin glue, or tape strips in place. These skin closures may need to stay in place for 2 weeks or longer. If tape strip edges start to loosen and curl up, you may trim the loose edges. Do not remove tape strips completely unless your provider tells you to do that. Check your wound every day for signs of infection. Check for: More redness, swelling, or pain. More fluid or blood. Warmth. Pus or a bad smell. General instructions Drink enough fluid to keep your pee (urine) pale yellow. Do not take baths, swim, or use a hot tub until your provider approves. Ask your provider if you may take showers. You may only be allowed to take sponge baths. Raise (elevate) the wound area above the level of your heart while you  are sitting or lying down. Do not scratch or pick at the wound. Keep all follow-up visits. These visits help your provider make sure a more serious infection is not developing. Contact a health care provider if: Your infection does not get better in 24-48 hours. You have signs of infection. You have a fever. Your wound gets larger, turns dark in color, or becomes more painful. You feel generally sick (malaise) with muscle aches and weakness. Your symptoms get worse. You have vomiting or diarrhea that does not stop. Get help right away if: Your wound that was closed breaks open. You see red streaks coming from the infected area. This information is not intended to replace advice given to you by your health care provider. Make sure you discuss any questions you have with your health care provider. Document Revised: 01/18/2022 Document Reviewed: 01/18/2022 Elsevier Patient Education  2024 [employer] Laceration Care, Adult A laceration is a cut that may go through all layers of the skin and into the tissue that is right under the skin. Some lacerations heal on their own. Others need to be closed with stitches (sutures), staples, skin adhesive strips, or skin glue. Proper care of a laceration reduces the risk for infection, helps the laceration heal better, and may prevent scarring. General tips Keep the wound clean and dry. Do not scratch or pick at the wound. Wash your hands with soap and water for at least 20 seconds before and after touching your wound or changing your bandage (dressing). If soap and water are not available, use hand sanitizer. Do not usedisinfectants or antiseptics, such as rubbing alcohol, to clean your wound unless told by your health care provider. If you were given a dressing, you should change it at least once a day, or as told by your health care provider. You should also change it if it becomes wet or dirty. How to care for your laceration If sutures or staples  were used: Keep the wound completely dry for the first 24 hours, or as told by your  health care provider. After that time, you may shower or bathe. Do not soak your wound in water until after the sutures or staples have been removed. Clean the wound once each day, or as told by your health care provider. To do this: Wash the wound with soap and water. Rinse the wound with water to remove all soap. Pat the wound dry with a clean towel. Do not rub the wound. After cleaning the wound, apply a thin layer of antibiotic ointment, other topical ointments, or a non-adherent dressing as told by your health care provider. This will help prevent infection and keep the dressing from sticking to the wound. Have the sutures or staples removed as told by your health care provider. Do not  remove sutures or staples yourself. If skin adhesive strips were used: Do not get the skin adhesive strips wet. You may shower or bathe, but keep the wound dry. If the wound gets wet, pat it dry with a clean towel. Do not rub the wound. Skin adhesive strips fall off on their own. If adhesive strip edges start to loosen and curl up, you may trim the loose edges. Do not remove adhesive strips completely unless your health care provider tells you to do that. If skin glue was used: You may shower or bathe, but try to keep the wound dry. Do not soak the wound in water. After showering or bathing, pat the wound dry with a clean towel. Do not rub the wound. Do not do any activities that will make you sweat a lot until the skin glue has fallen off. Do not apply liquid, cream, or ointment medicine to the wound while the skin glue is in place. Doing this may loosen the film before the wound has healed. If a dressing is placed over the wound, do not apply tape directly over the skin glue. Doing this may cause the glue to be pulled off before the wound has healed. Do not pick at the glue. Skin glue usually remains in place for 5-10 days and  then falls off the skin. Follow these instructions at home: Medicines Take over-the-counter and prescription medicines only as told by your health care provider. If you were prescribed an antibiotic medicine or ointment, take or apply it as told by your health care provider. Do not stop using it even if your condition improves. Managing pain and swelling If directed, put ice on the injured area. To do this: Put ice in a plastic bag. Place a towel between your skin and the bag. Leave the ice on for 20 minutes, 2-3 times a day. Remove the ice if your skin turns bright red. This is very important. If you cannot feel pain, heat, or cold, you have a greater risk of damage to the area. Raise (elevate) the injured area above the level of your heart while you are sitting or lying down for the first 24-48 hours after the laceration is repaired. General instructions  Avoid any activity that could cause your wound to reopen. Check your wound every day for signs of infection. Watch for: More redness, swelling, or pain. Fluid or blood. Warmth. Pus or a bad smell. Keep all follow-up visits. This is important. Contact a health care provider if: You received a tetanus shot and you have swelling, severe pain, redness, or bleeding at the injection site. Your closed wound breaks open. You have any of these signs of infection: More redness, swelling, or pain around your wound. Fluid or blood coming  from your wound. Warmth coming from your wound. Pus or a bad smell coming from your wound. A fever. You notice something coming out of the wound, such as wood or glass. Your pain is not controlled with medicine. You notice a change in the color of your skin near your wound. You need to change the dressing often. You develop a new rash. You have numbness around the wound. Get help right away if: You develop severe swelling around the wound. Your pain suddenly increases and is severe. You develop painful  lumps near the wound or on skin anywhere else on your body. You have a red streak going away from your wound. The wound is on your hand or foot, and you cannot properly move a finger or toe. The wound is on your hand or foot, and you notice that your fingers or toes look pale or bluish. Summary A laceration is a cut that may go through all layers of the skin and into the tissue that is right under the skin. Some lacerations heal on their own. Others need to be closed with stitches (sutures), staples, skin adhesive strips, or skin glue. Proper care of a laceration reduces the risk of infection, helps the laceration heal better, and may prevent scarring. This information is not intended to replace advice given to you by your health care provider. Make sure you discuss any questions you have with your health care provider. Document Revised: 06/23/2020 Document Reviewed: 06/23/2020 Elsevier Patient Education  2024 [employer]  The above assessment and management plan was discussed with the patient. The patient verbalized understanding of and has agreed to the management plan. Patient is aware to call the clinic if they develop any new symptoms or if symptoms persist or worsen. Patient is aware when to return to the clinic for a follow-up visit. Patient educated on when it is appropriate to go to the emergency department.    Nena Cassis Morton Hummer, DNP [facility] [address] [place], [place] [address] [phone]      [1]  Allergies Allergen Reactions   Nuts Nausea And Vomiting    sweating   Other Nausea And Vomiting    Per patient, cannot take any narcotic. Nausea and vomiting sweating   Vicodin [Hydrocodone-Acetaminophen ]     tachycardia   Codeine Nausea And Vomiting   Aspirin  Other (See Comments)    Upset stomach, bleeding   Penicillins Hives and Rash    Has patient had a PCN reaction causing immediate rash, facial/tongue/throat swelling, SOB or  lightheadedness with hypotension:No Has patient had a PCN reaction causing severe rash involving mucus membranes or skin necrosis:No Has patient had a PCN reaction that required hospitalization:No Has patient had a PCN reaction occurring within the last 10 years:Yes If all of the above answers are NO, then may proceed with Cephalosporin use.    "

## 2024-05-18 NOTE — Telephone Encounter (Signed)
" °  FYI Only or Action Required?: FYI only for provider: appointment scheduled on 1/19.  Patient was last seen in primary care on 12/12/2023 by [name], Fonda LABOR, MD.  Called Nurse Triage reporting Foot Pain.  Symptoms began yesterday.  Interventions attempted: Nothing.  Symptoms are: rapidly worsening.  Triage Disposition: See HCP Within 4 Hours (Or PCP Triage)  Patient/caregiver understands and will follow disposition?: Yes  Reason for Triage: Problem with right foot, Hard to walk on it, state it hurts really bad    Reason for Disposition  [1] SEVERE pain (e.g., excruciating, unable to do any normal activities) AND [2] not improved after 2 hours of pain medicine  Answer Assessment - Initial Assessment Questions 1. ONSET: When did the pain start?      Yesterday  2. LOCATION: Where is the pain located?      R foot  3. PAIN: How bad is the pain?    (Scale 1-10; or mild, moderate, severe)     Hard to walk on it, 10/10 when walking. Denies recent trauma or injury  4. WORK OR EXERCISE: Has there been any recent work or exercise that involved this part of the body?     Denies  5. CAUSE: What do you think is causing the foot pain?     Pt unsure, says maybe a bone spur   6. OTHER SYMPTOMS: Do you have any other symptoms? (e.g., leg pain, rash, fever, numbness)     Pt reports a white circle to his foot, reports he filed on it in an attempt to alleviate his pain  Protocols used: Foot Pain-A-AH  "

## 2024-05-26 LAB — ANAEROBIC AND AEROBIC CULTURE

## 2024-05-27 ENCOUNTER — Ambulatory Visit: Payer: Self-pay | Admitting: Nurse Practitioner

## 2024-06-15 ENCOUNTER — Ambulatory Visit: Payer: Self-pay | Admitting: Family Medicine

## 2024-07-13 ENCOUNTER — Ambulatory Visit: Payer: Self-pay
